# Patient Record
Sex: Female | Born: 1980 | Race: Black or African American | Hispanic: No | Marital: Married | Smoking: Never smoker
Health system: Southern US, Community
[De-identification: ages and names within clinical notes are randomized; demographics above are authoritative.]

## PROBLEM LIST (undated history)

## (undated) ENCOUNTER — Inpatient Hospital Stay (HOSPITAL_COMMUNITY): Payer: Self-pay

## (undated) DIAGNOSIS — D500 Iron deficiency anemia secondary to blood loss (chronic): Secondary | ICD-10-CM

## (undated) DIAGNOSIS — R51 Headache: Secondary | ICD-10-CM

## (undated) DIAGNOSIS — F32A Depression, unspecified: Secondary | ICD-10-CM

## (undated) DIAGNOSIS — R0600 Dyspnea, unspecified: Secondary | ICD-10-CM

## (undated) DIAGNOSIS — Z8639 Personal history of other endocrine, nutritional and metabolic disease: Secondary | ICD-10-CM

## (undated) DIAGNOSIS — N870 Mild cervical dysplasia: Secondary | ICD-10-CM

## (undated) DIAGNOSIS — R87619 Unspecified abnormal cytological findings in specimens from cervix uteri: Secondary | ICD-10-CM

## (undated) DIAGNOSIS — G43909 Migraine, unspecified, not intractable, without status migrainosus: Secondary | ICD-10-CM

## (undated) DIAGNOSIS — O09299 Supervision of pregnancy with other poor reproductive or obstetric history, unspecified trimester: Secondary | ICD-10-CM

## (undated) DIAGNOSIS — O139 Gestational [pregnancy-induced] hypertension without significant proteinuria, unspecified trimester: Secondary | ICD-10-CM

## (undated) DIAGNOSIS — R600 Localized edema: Secondary | ICD-10-CM

## (undated) DIAGNOSIS — D649 Anemia, unspecified: Secondary | ICD-10-CM

## (undated) DIAGNOSIS — O99345 Other mental disorders complicating the puerperium: Secondary | ICD-10-CM

## (undated) DIAGNOSIS — B9689 Other specified bacterial agents as the cause of diseases classified elsewhere: Secondary | ICD-10-CM

## (undated) DIAGNOSIS — R0602 Shortness of breath: Secondary | ICD-10-CM

## (undated) DIAGNOSIS — N921 Excessive and frequent menstruation with irregular cycle: Secondary | ICD-10-CM

## (undated) DIAGNOSIS — R300 Dysuria: Secondary | ICD-10-CM

## (undated) DIAGNOSIS — F53 Postpartum depression: Secondary | ICD-10-CM

## (undated) DIAGNOSIS — F329 Major depressive disorder, single episode, unspecified: Secondary | ICD-10-CM

## (undated) DIAGNOSIS — R000 Tachycardia, unspecified: Secondary | ICD-10-CM

## (undated) DIAGNOSIS — E785 Hyperlipidemia, unspecified: Secondary | ICD-10-CM

## (undated) DIAGNOSIS — N946 Dysmenorrhea, unspecified: Secondary | ICD-10-CM

## (undated) DIAGNOSIS — N8003 Adenomyosis of the uterus: Secondary | ICD-10-CM

## (undated) DIAGNOSIS — Z87440 Personal history of urinary (tract) infections: Secondary | ICD-10-CM

## (undated) DIAGNOSIS — K5730 Diverticulosis of large intestine without perforation or abscess without bleeding: Secondary | ICD-10-CM

## (undated) DIAGNOSIS — N760 Acute vaginitis: Secondary | ICD-10-CM

## (undated) DIAGNOSIS — I499 Cardiac arrhythmia, unspecified: Secondary | ICD-10-CM

## (undated) DIAGNOSIS — N762 Acute vulvitis: Secondary | ICD-10-CM

## (undated) DIAGNOSIS — R011 Cardiac murmur, unspecified: Secondary | ICD-10-CM

## (undated) DIAGNOSIS — M549 Dorsalgia, unspecified: Secondary | ICD-10-CM

## (undated) DIAGNOSIS — R5383 Other fatigue: Secondary | ICD-10-CM

## (undated) DIAGNOSIS — E611 Iron deficiency: Secondary | ICD-10-CM

## (undated) DIAGNOSIS — Z9889 Other specified postprocedural states: Secondary | ICD-10-CM

## (undated) DIAGNOSIS — IMO0002 Reserved for concepts with insufficient information to code with codable children: Secondary | ICD-10-CM

## (undated) DIAGNOSIS — I10 Essential (primary) hypertension: Secondary | ICD-10-CM

## (undated) DIAGNOSIS — Z8619 Personal history of other infectious and parasitic diseases: Secondary | ICD-10-CM

## (undated) DIAGNOSIS — M25569 Pain in unspecified knee: Secondary | ICD-10-CM

## (undated) DIAGNOSIS — F419 Anxiety disorder, unspecified: Secondary | ICD-10-CM

## (undated) DIAGNOSIS — A749 Chlamydial infection, unspecified: Secondary | ICD-10-CM

## (undated) DIAGNOSIS — B999 Unspecified infectious disease: Secondary | ICD-10-CM

## (undated) HISTORY — DX: Dorsalgia, unspecified: M54.9

## (undated) HISTORY — DX: Essential (primary) hypertension: I10

## (undated) HISTORY — DX: Personal history of urinary (tract) infections: Z87.440

## (undated) HISTORY — DX: Major depressive disorder, single episode, unspecified: F32.9

## (undated) HISTORY — DX: Anemia, unspecified: D64.9

## (undated) HISTORY — DX: Dyspnea, unspecified: R06.00

## (undated) HISTORY — DX: Acute vaginitis: N76.0

## (undated) HISTORY — DX: Iron deficiency: E61.1

## (undated) HISTORY — DX: Personal history of other infectious and parasitic diseases: Z86.19

## (undated) HISTORY — DX: Gestational (pregnancy-induced) hypertension without significant proteinuria, unspecified trimester: O13.9

## (undated) HISTORY — DX: Reserved for concepts with insufficient information to code with codable children: IMO0002

## (undated) HISTORY — DX: Cardiac arrhythmia, unspecified: I49.9

## (undated) HISTORY — PX: BREAST SURGERY: SHX581

## (undated) HISTORY — PX: FINGER SURGERY: SHX640

## (undated) HISTORY — DX: Chlamydial infection, unspecified: A74.9

## (undated) HISTORY — DX: Other specified postprocedural states: Z98.890

## (undated) HISTORY — DX: Dysuria: R30.0

## (undated) HISTORY — DX: Shortness of breath: R06.02

## (undated) HISTORY — DX: Unspecified infectious disease: B99.9

## (undated) HISTORY — DX: Headache: R51

## (undated) HISTORY — DX: Other specified bacterial agents as the cause of diseases classified elsewhere: B96.89

## (undated) HISTORY — DX: Depression, unspecified: F32.A

## (undated) HISTORY — DX: Unspecified abnormal cytological findings in specimens from cervix uteri: R87.619

## (undated) HISTORY — DX: Pain in unspecified knee: M25.569

## (undated) HISTORY — DX: Supervision of pregnancy with other poor reproductive or obstetric history, unspecified trimester: O09.299

## (undated) HISTORY — DX: Anxiety disorder, unspecified: F41.9

## (undated) HISTORY — PX: WISDOM TOOTH EXTRACTION: SHX21

## (undated) HISTORY — DX: Other mental disorders complicating the puerperium: O99.345

## (undated) HISTORY — DX: Localized edema: R60.0

## (undated) HISTORY — DX: Mild cervical dysplasia: N87.0

## (undated) HISTORY — DX: Dysmenorrhea, unspecified: N94.6

## (undated) HISTORY — DX: Tachycardia, unspecified: R00.0

## (undated) HISTORY — DX: Other fatigue: R53.83

## (undated) HISTORY — DX: Hyperlipidemia, unspecified: E78.5

## (undated) HISTORY — DX: Adenomyosis of the uterus: N80.03

## (undated) HISTORY — DX: Acute vulvitis: N76.2

## (undated) HISTORY — DX: Personal history of other endocrine, nutritional and metabolic disease: Z86.39

## (undated) HISTORY — PX: DILATION AND CURETTAGE OF UTERUS: SHX78

## (undated) HISTORY — DX: Postpartum depression: F53.0

---

## 1999-12-09 HISTORY — PX: FINGER SURGERY: SHX640

## 2006-03-13 ENCOUNTER — Emergency Department (HOSPITAL_COMMUNITY): Admission: EM | Admit: 2006-03-13 | Discharge: 2006-03-13 | Payer: Self-pay | Admitting: Emergency Medicine

## 2006-12-08 DIAGNOSIS — N870 Mild cervical dysplasia: Secondary | ICD-10-CM

## 2006-12-08 HISTORY — PX: BREAST REDUCTION SURGERY: SHX8

## 2006-12-08 HISTORY — DX: Mild cervical dysplasia: N87.0

## 2007-04-15 ENCOUNTER — Emergency Department (HOSPITAL_COMMUNITY): Admission: EM | Admit: 2007-04-15 | Discharge: 2007-04-15 | Payer: Self-pay | Admitting: Emergency Medicine

## 2007-12-09 DIAGNOSIS — N762 Acute vulvitis: Secondary | ICD-10-CM

## 2007-12-09 DIAGNOSIS — I499 Cardiac arrhythmia, unspecified: Secondary | ICD-10-CM

## 2007-12-09 DIAGNOSIS — Z87410 Personal history of cervical dysplasia: Secondary | ICD-10-CM

## 2007-12-09 HISTORY — DX: Cardiac arrhythmia, unspecified: I49.9

## 2007-12-09 HISTORY — DX: Acute vulvitis: N76.2

## 2007-12-09 HISTORY — DX: Personal history of cervical dysplasia: Z87.410

## 2008-04-04 DIAGNOSIS — IMO0002 Reserved for concepts with insufficient information to code with codable children: Secondary | ICD-10-CM

## 2008-04-04 HISTORY — DX: Reserved for concepts with insufficient information to code with codable children: IMO0002

## 2008-07-25 ENCOUNTER — Ambulatory Visit (HOSPITAL_COMMUNITY): Admission: RE | Admit: 2008-07-25 | Discharge: 2008-07-25 | Payer: Self-pay | Admitting: Obstetrics and Gynecology

## 2008-07-25 ENCOUNTER — Encounter (INDEPENDENT_AMBULATORY_CARE_PROVIDER_SITE_OTHER): Payer: Self-pay | Admitting: Obstetrics and Gynecology

## 2008-07-25 HISTORY — PX: LEEP: SHX91

## 2008-10-16 ENCOUNTER — Ambulatory Visit (HOSPITAL_COMMUNITY): Admission: RE | Admit: 2008-10-16 | Discharge: 2008-10-16 | Payer: Self-pay | Admitting: Obstetrics and Gynecology

## 2008-10-16 ENCOUNTER — Encounter (INDEPENDENT_AMBULATORY_CARE_PROVIDER_SITE_OTHER): Payer: Self-pay | Admitting: Obstetrics and Gynecology

## 2008-10-16 HISTORY — PX: CERVICAL BIOPSY  W/ LOOP ELECTRODE EXCISION: SUR135

## 2008-12-08 DIAGNOSIS — R000 Tachycardia, unspecified: Secondary | ICD-10-CM

## 2008-12-08 DIAGNOSIS — R300 Dysuria: Secondary | ICD-10-CM

## 2008-12-08 HISTORY — PX: CERVICAL BIOPSY  W/ LOOP ELECTRODE EXCISION: SUR135

## 2008-12-08 HISTORY — DX: Tachycardia, unspecified: R00.0

## 2008-12-08 HISTORY — DX: Dysuria: R30.0

## 2009-06-18 HISTORY — PX: US ECHOCARDIOGRAPHY: HXRAD669

## 2009-08-24 ENCOUNTER — Inpatient Hospital Stay (HOSPITAL_COMMUNITY): Admission: AD | Admit: 2009-08-24 | Discharge: 2009-08-24 | Payer: Self-pay | Admitting: Obstetrics and Gynecology

## 2009-11-21 ENCOUNTER — Inpatient Hospital Stay (HOSPITAL_COMMUNITY): Admission: AD | Admit: 2009-11-21 | Discharge: 2009-11-21 | Payer: Self-pay | Admitting: Obstetrics and Gynecology

## 2009-12-08 DIAGNOSIS — B9689 Other specified bacterial agents as the cause of diseases classified elsewhere: Secondary | ICD-10-CM

## 2009-12-08 DIAGNOSIS — F53 Postpartum depression: Secondary | ICD-10-CM

## 2009-12-08 DIAGNOSIS — Z8759 Personal history of other complications of pregnancy, childbirth and the puerperium: Secondary | ICD-10-CM

## 2009-12-08 HISTORY — DX: Personal history of other complications of pregnancy, childbirth and the puerperium: Z87.59

## 2009-12-08 HISTORY — DX: Other specified bacterial agents as the cause of diseases classified elsewhere: B96.89

## 2009-12-08 HISTORY — DX: Postpartum depression: F53.0

## 2009-12-21 ENCOUNTER — Inpatient Hospital Stay (HOSPITAL_COMMUNITY): Admission: AD | Admit: 2009-12-21 | Discharge: 2009-12-23 | Payer: Self-pay | Admitting: Obstetrics and Gynecology

## 2011-01-20 ENCOUNTER — Ambulatory Visit (HOSPITAL_BASED_OUTPATIENT_CLINIC_OR_DEPARTMENT_OTHER): Admission: RE | Admit: 2011-01-20 | Payer: 59 | Source: Ambulatory Visit | Admitting: Obstetrics & Gynecology

## 2011-01-27 ENCOUNTER — Other Ambulatory Visit (HOSPITAL_COMMUNITY): Payer: 59

## 2011-01-28 ENCOUNTER — Ambulatory Visit (HOSPITAL_COMMUNITY)
Admission: RE | Admit: 2011-01-28 | Discharge: 2011-01-28 | Disposition: A | Discharge status: Home / Self Care | Payer: 59 | Source: Ambulatory Visit | Attending: Obstetrics & Gynecology | Admitting: Obstetrics & Gynecology

## 2011-01-28 ENCOUNTER — Other Ambulatory Visit: Payer: Self-pay | Admitting: Obstetrics & Gynecology

## 2011-01-28 DIAGNOSIS — O039 Complete or unspecified spontaneous abortion without complication: Secondary | ICD-10-CM | POA: Insufficient documentation

## 2011-01-28 LAB — TYPE AND SCREEN
ABO/RH(D): A POS
Antibody Screen: NEGATIVE

## 2011-01-28 LAB — CBC
MCHC: 30.4 g/dL (ref 30.0–36.0)
WBC: 4 10*3/uL (ref 4.0–10.5)

## 2011-01-29 HISTORY — PX: DILATION AND EVACUATION: SHX1459

## 2011-02-19 NOTE — Op Note (Signed)
  NAME:  Patricia Lin, Patricia Lin                 ACCOUNT NO.:  [id number]  MEDICAL RECORD NO.:  [id number]           PATIENT TYPE:  O  LOCATION:  [facility]                          [facility]AC[facility]  [facility]  PHYSICIAN:  Genia Del, M.D.DATE OF BIRTH:  06-13-1981  DATE OF PROCEDURE:  01/28/2011 DATE OF DISCHARGE:  01/28/2011                              OPERATIVE REPORT   PREOPERATIVE DIAGNOSIS:  A 9 plus weeks' gestation, unwanted pregnancy.  POSTOPERATIVE DIAGNOSIS:  A 9 plus weeks' gestation, unwanted pregnancy.  PROCEDURE:  Dilatation and evacuation with sharp curettage and suction.  SURGEON:  Genia Del, MD  ASSISTANT:  None.  DESCRIPTION OF PROCEDURE:  Under MAC analgesia, the patient was in lithotomy position, she was prepped with Betadine on the suprapubic vulvar and vaginal areas and draped as usual.  The vaginal exam reveals an anteverted uterus about 10 cm, mobile, no adnexal mass.  The cervix was Heyne and closed.  No vaginal bleeding.  We put the speculum in the vagina and the anterior lip of the cervix was grasped with a tenaculum. We proceeded with a paracervical block with lidocaine 1%, a total of 20 mL at 4 and 8 o'clock.  We then dilated the cervix with Hegar dilators up to #29 without difficulty.  We used a #9 curved curette to suction the intrauterine cavity.  This was done without difficulty.  We then used a sharp curette to assure that products of conception have been removed from all intrauterine surfaces.  We heard the uterine sound.  We then gone back with the suction to assure that all products of conception and blood clots were removed.  The uterus contracts well on the instrument.  Products of conception corresponding to 9 plus weeks were removed and sent to Pathology.  Hemostasis was adequate.  We removed all instruments.  Hemostasis was good.  The estimated blood loss was 50 mL.  The patient received Ancef 1 g IV before the procedure.  The bladder was catheterized  also before the procedure.  No complications occurred and the patient was brought to recovery room in good stable status.     Genia Del, M.D.     ML/MEDQ  D:  01/28/2011  T:  01/29/2011  Job:  [id number]  Electronically Signed by Genia Del M.D. on 02/19/2011 06:23:09 PM

## 2011-02-23 LAB — COMPREHENSIVE METABOLIC PANEL
ALT: 13 U/L (ref 0–35)
ALT: 15 U/L (ref 0–35)
AST: 19 U/L (ref 0–37)
AST: 28 U/L (ref 0–37)
Alkaline Phosphatase: 111 U/L (ref 39–117)
BUN: 4 mg/dL — ABNORMAL LOW (ref 6–23)
BUN: 9 mg/dL (ref 6–23)
CO2: 24 mEq/L (ref 19–32)
Calcium: 7.6 mg/dL — ABNORMAL LOW (ref 8.4–10.5)
Creatinine, Ser: 0.51 mg/dL (ref 0.4–1.2)
Creatinine, Ser: 0.69 mg/dL (ref 0.4–1.2)
GFR calc Af Amer: >60 mL/min (ref >60)
GFR calc Af Amer: >60 mL/min (ref >60)
GFR calc non Af Amer: >60 mL/min (ref >60)
Glucose, Bld: 103 mg/dL — ABNORMAL HIGH (ref 70–99)
Glucose, Bld: 77 mg/dL (ref 70–99)
Potassium: 3.9 mEq/L (ref 3.5–5.1)
Sodium: 134 mEq/L — ABNORMAL LOW (ref 135–145)
Total Bilirubin: 0.6 mg/dL (ref 0.3–1.2)
Total Protein: 5 g/dL — ABNORMAL LOW (ref 6.0–8.3)

## 2011-02-23 LAB — CBC
Hemoglobin: 7.7 g/dL — ABNORMAL LOW (ref 12.0–15.0)
MCHC: 31.9 g/dL (ref 30.0–36.0)
MCHC: 32.8 g/dL (ref 30.0–36.0)
MCV: 72.5 fL — ABNORMAL LOW (ref 78.0–100.0)
RBC: 3.25 MIL/uL — ABNORMAL LOW (ref 3.87–5.11)
RBC: 3.77 MIL/uL — ABNORMAL LOW (ref 3.87–5.11)
RDW: 16.8 % — ABNORMAL HIGH (ref 11.5–15.5)
WBC: 4.8 10*3/uL (ref 4.0–10.5)

## 2011-02-23 LAB — URINALYSIS, ROUTINE W REFLEX MICROSCOPIC
Bilirubin Urine: NEGATIVE
Glucose, UA: NEGATIVE mg/dL
Glucose, UA: NEGATIVE mg/dL
Hgb urine dipstick: NEGATIVE
Ketones, ur: NEGATIVE mg/dL
Leukocytes, UA: NEGATIVE
Protein, ur: 30 mg/dL — AB
Specific Gravity, Urine: >1.03 — ABNORMAL HIGH (ref 1.005–1.030)
Urobilinogen, UA: 0.2 mg/dL (ref 0.0–1.0)
Urobilinogen, UA: 0.2 mg/dL (ref 0.0–1.0)

## 2011-02-23 LAB — URINE MICROSCOPIC-ADD ON

## 2011-02-23 LAB — TYPE AND SCREEN
ABO/RH(D): A POS
Antibody Screen: NEGATIVE

## 2011-03-11 LAB — GC/CHLAMYDIA PROBE AMP, GENITAL
Chlamydia, DNA Probe: NEGATIVE
GC Probe Amp, Genital: NEGATIVE

## 2011-03-11 LAB — WET PREP, GENITAL
Clue Cells Wet Prep HPF POC: NONE SEEN
Trich, Wet Prep: NONE SEEN

## 2011-03-11 LAB — STREP B DNA PROBE

## 2011-03-14 LAB — URINALYSIS, ROUTINE W REFLEX MICROSCOPIC
Glucose, UA: NEGATIVE mg/dL
Ketones, ur: >80 mg/dL — AB
Nitrite: NEGATIVE
Specific Gravity, Urine: >1.03 — ABNORMAL HIGH (ref 1.005–1.030)
Urobilinogen, UA: 1 mg/dL (ref 0.0–1.0)

## 2011-03-14 LAB — URINE CULTURE

## 2011-04-22 NOTE — Op Note (Signed)
NAME:  Patricia Lin, Patricia Lin                 ACCOUNT NO.:  [id number]   MEDICAL RECORD NO.:  [id number]          PATIENT TYPE:  AMB   LOCATION:  [facility]                           [facility]ILITY:  [facility]   PHYSICIAN:  Osborn Coho, M.D.   DATE OF BIRTH:  1981-07-01   DATE OF PROCEDURE:  DATE OF DISCHARGE:                               OPERATIVE REPORT   PREOPERATIVE DIAGNOSIS:  Status post loop electrosurgical excision  procedure secondary to persistent cervical intraepithelial neoplasia-1  with positive endocervical margins.   POSTOPERATIVE DIAGNOSIS:  Status post loop electrosurgical excision  procedure secondary to persistent cervical intraepithelial neoplasia-1  with positive endocervical margins.   PROCEDURE:  LEEP, top-hat.   ATTENDING DOCTOR:  Osborn Coho, MD   ANESTHESIA:  General via LMA.   SPECIMENS:  To pathology top-hat with a silk stitch at 9 o'clock and  portion of cervix.   FLUIDS:  1000 mL.   URINE OUTPUT:  Quantity sufficient via straight cath prior to procedure.   ESTIMATED BLOOD LOSS:  Minimal.   COMPLICATIONS:  None.   PROCEDURE:  The patient was taken to the operating room after the risks,  benefits, and alternatives discussed with the patient.  The patient  verbalized understanding, consent signed and witnessed.  The patient was  placed under general anesthesia and prepped and draped in the normal  sterile fashion in the dorsal lithotomy fashion.  A bivalved speculum  placed in the patient's vagina with the smoke evacuator, and the  anterior lip of the cervix grasped with a single-tooth tenaculum.  Cervix was prepped with acetic acid, and there was a lesion noted at the  9 and 12 o'clock position.  Top-hat was performed and then LEEP  performed as well with the 15-mm x 12-mm loop.  Ball cautery was used to  cauterize the base of the cervix and Monsel solution applied.  There was  good hemostasis.  The tenaculum was removed.  There was bleeding noted  at the tenaculum  sites which was cauterized with the ball cautery tip.  Count was correct.  The patient tolerated the procedure well and is  currently awaiting transfer to the recovery room in good condition.     Osborn Coho, M.D.  Electronically Signed    AR/MEDQ  D:  10/16/2008  T:  10/17/2008  Job:  [id number]

## 2011-04-22 NOTE — Op Note (Signed)
[name], Patricia Lin                 ACCOUNT NO.:  [id number]   MEDICAL RECORD NO.:  [id number]          PATIENT TYPE:  AMB   LOCATION:  [facility]                           [facility]   PHYSICIAN:  Osborn Coho, M.D.   DATE OF BIRTH:  [birth year]   DATE OF PROCEDURE:  07/25/2008  DATE OF DISCHARGE:                               OPERATIVE REPORT   PREOPERATIVE DIAGNOSIS:  Persistent cervical intraepithelial neoplasia  I.   POSTOPERATIVE DIAGNOSIS:  Persistent cervical intraepithelial neoplasia  I.   PROCEDURE:  LEEP.   ATTENDING:  Osborn Coho, MD   ANESTHESIA:  MAC.   FINDINGS:  Acetowhite lesions at 6 to 9 o'clock and 10 o'clock.   SPECIMENS TO PATHOLOGY:  Two cervical LEEP specimens, one with a black  suture at 3 o'clock and one with a white suture at 12 o'clock.   FLUIDS:  700 mL.   URINE OUTPUT:  Quantity sufficient via straight cath prior to procedure.   EBL:  Minimal.   COMPLICATIONS:  None.   PROCEDURE:  The patient was taken to the operating room after the risks,  benefits, and alternatives reviewed with the patient.  The patient  verbalized understanding and consent signed and witnessed.  The patient  was given a MAC per anesthesia, prepped and draped in normal sterile  fashion in the dorsal lithotomy position.  A bivalve speculum was placed  in the patient's vagina and acetic acid was placed on the cervix.  The  acetowhite lesions were as noted above.  LEEP was performed with the  medium sized loop measuring 15 mm x 12 mm.  The specimens were tagged as  noted above.  The bed of the cervix was cauterized with the ball tip  cautery.  A Monsel's solution was placed in the bed as well.  The  anterior lip of cervix had been grasped with a single-tooth tenaculum,  and there was good hemostasis at tenaculum site after the tenaculum was  removed.  A paracervical block was administered using a total  of 10 mL of 1% lidocaine.  Good hemostasis was noted.  All  instruments  were removed.  Sponge, lap, and needle count was correct.  The patient  tolerated the procedure well and is currently awaiting transfer to the  recovery room in good condition.      Osborn Coho, M.D.  Electronically Signed     AR/MEDQ  D:  07/25/2008  T:  07/26/2008  Job:  [id number]

## 2011-09-09 LAB — CBC
HCT: 30.1 — ABNORMAL LOW
Hemoglobin: 9.7 — ABNORMAL LOW
Platelets: 315
RDW: 17.5 — ABNORMAL HIGH
WBC: 3.5 — ABNORMAL LOW

## 2011-09-09 LAB — HCG, SERUM, QUALITATIVE: Preg, Serum: NEGATIVE

## 2012-04-15 ENCOUNTER — Encounter: Payer: Self-pay | Admitting: *Deleted

## 2012-06-28 ENCOUNTER — Ambulatory Visit (INDEPENDENT_AMBULATORY_CARE_PROVIDER_SITE_OTHER): Payer: 59 | Admitting: Obstetrics and Gynecology

## 2012-06-28 ENCOUNTER — Encounter: Payer: Self-pay | Admitting: Obstetrics and Gynecology

## 2012-06-28 DIAGNOSIS — Z36 Encounter for antenatal screening of mother: Secondary | ICD-10-CM

## 2012-06-28 DIAGNOSIS — Z3201 Encounter for pregnancy test, result positive: Secondary | ICD-10-CM

## 2012-06-28 DIAGNOSIS — Z331 Pregnant state, incidental: Secondary | ICD-10-CM

## 2012-06-28 LAB — POCT URINALYSIS DIPSTICK
Bilirubin, UA: NEGATIVE
Blood, UA: NEGATIVE
Glucose, UA: NEGATIVE
Ketones, UA: NEGATIVE
Nitrite, UA: NEGATIVE
Spec Grav, UA: 1.025

## 2012-06-28 MED ORDER — ONDANSETRON 4 MG PO TBDP: 4.0000 mg | ORAL_TABLET | Freq: Three times a day (TID) | ORAL | Status: AC | PRN

## 2012-06-28 NOTE — Progress Notes (Signed)
PT TAKING IBUPROFEN AND OCC EXEDRIN MIGRAINE FOR HEADACHE.  ALSO OCC ASA WHEN HAS HEART TACHYCARDIA. PER DD ADVISED NOT TO TAKE ANY OF THE LISTED MEDICATIONS.  ADVISED 8-10 GLASSES WATER PER DAY AS IS CURRENTLY NOT DRINKING ANY.  ALSO SUGGESTED TYLENOL WITH CAFFEINE FOR HEADACHE.   TO CALL WITH NO IMPROVEMENT. ZOFRAN ORDERED FOR NAUSEA AND VOMITING. CONSTIPATION PRECAUTIONS GIVEN.  OFFERED PT NOB W/U  APPOINTMENT 06/30/12. PT DECLINED DUE TO PAYMENT ISSUES. SCHEDULED 07/09/12.  1ST TRIMESTER APPT 07/02/12.

## 2012-06-29 LAB — PRENATAL PANEL VII
HCT: 27.9 % — ABNORMAL LOW (ref 36.0–46.0)
HIV: NONREACTIVE
Hemoglobin: 8.8 g/dL — ABNORMAL LOW (ref 12.0–15.0)
Hepatitis B Surface Ag: NEGATIVE
Lymphs Abs: 1.4 10*3/uL (ref 0.7–4.0)
MCH: 21 pg — ABNORMAL LOW (ref 26.0–34.0)
Monocytes Relative: 8 % (ref 3–12)
Neutro Abs: 3.8 10*3/uL (ref 1.7–7.7)
Neutrophils Relative %: 66 % (ref 43–77)
RBC: 4.19 MIL/uL (ref 3.87–5.11)
Rubella: 17.9 IU/mL — ABNORMAL HIGH

## 2012-07-02 ENCOUNTER — Telehealth: Payer: Self-pay | Admitting: Obstetrics and Gynecology

## 2012-07-02 ENCOUNTER — Ambulatory Visit (INDEPENDENT_AMBULATORY_CARE_PROVIDER_SITE_OTHER): Payer: 59

## 2012-07-02 ENCOUNTER — Other Ambulatory Visit: Payer: Self-pay | Admitting: Obstetrics and Gynecology

## 2012-07-02 DIAGNOSIS — Z36 Encounter for antenatal screening of mother: Secondary | ICD-10-CM

## 2012-07-02 LAB — US OB COMP LESS 14 WKS

## 2012-07-02 NOTE — Telephone Encounter (Signed)
Message copied by Mason Jim on Fri Jul 02, 2012  4:05 PM ------      Message from: [facility], [place]      Created: Fri Jul 02, 2012 11:13 AM      Regarding: needs RX iron        Needs RX feso4 1 po daily #30 refill x 5      Nutrition for anemia

## 2012-07-02 NOTE — Telephone Encounter (Signed)
TC to pt. LM to return call.  

## 2012-07-09 ENCOUNTER — Encounter: Payer: 59 | Admitting: Obstetrics and Gynecology

## 2012-08-11 ENCOUNTER — Other Ambulatory Visit: Payer: Self-pay

## 2012-08-11 ENCOUNTER — Encounter: Payer: Self-pay | Admitting: Obstetrics and Gynecology

## 2012-08-11 ENCOUNTER — Ambulatory Visit (INDEPENDENT_AMBULATORY_CARE_PROVIDER_SITE_OTHER): Payer: 59 | Admitting: Obstetrics and Gynecology

## 2012-08-11 ENCOUNTER — Telehealth: Payer: Self-pay | Admitting: Obstetrics and Gynecology

## 2012-08-11 VITALS — BP 100/60 | Temp 98.6°F | Ht 62.0 in | Wt 178.0 lb

## 2012-08-11 DIAGNOSIS — N760 Acute vaginitis: Secondary | ICD-10-CM

## 2012-08-11 DIAGNOSIS — A499 Bacterial infection, unspecified: Secondary | ICD-10-CM

## 2012-08-11 DIAGNOSIS — R300 Dysuria: Secondary | ICD-10-CM

## 2012-08-11 DIAGNOSIS — B9689 Other specified bacterial agents as the cause of diseases classified elsewhere: Secondary | ICD-10-CM

## 2012-08-11 DIAGNOSIS — Z3689 Encounter for other specified antenatal screening: Secondary | ICD-10-CM

## 2012-08-11 DIAGNOSIS — Z331 Pregnant state, incidental: Secondary | ICD-10-CM

## 2012-08-11 DIAGNOSIS — N898 Other specified noninflammatory disorders of vagina: Secondary | ICD-10-CM

## 2012-08-11 LAB — POCT URINALYSIS DIPSTICK
Blood, UA: NEGATIVE
Protein, UA: NEGATIVE
Spec Grav, UA: 1.01
Urobilinogen, UA: NEGATIVE

## 2012-08-11 MED ORDER — IBUPROFEN 600 MG PO TABS: 600.0000 mg | ORAL_TABLET | Freq: Four times a day (QID) | ORAL | Status: AC | PRN

## 2012-08-11 MED ORDER — METRONIDAZOLE 250 MG PO TABS: 250.0000 mg | ORAL_TABLET | Freq: Three times a day (TID) | ORAL | Status: AC

## 2012-08-11 NOTE — Progress Notes (Signed)
[redacted]w[redacted]d abd pain/dysuria Contraception: none currently pregnant History of STD:  history of chlamydia History of ovarian cyst: no History of fibroids: no History of endometriosis:no Previous ultrasound:no  Urinary symptoms: dysuria Gastro-intestinal symptoms:  Constipation: no     Diarrhea: no     Nausea: no     Vomiting: no     Fever: no Vaginal discharge: color white, with odor

## 2012-08-11 NOTE — Progress Notes (Signed)
[redacted]w[redacted]d Worked in for low abdominal pain. Chem 10 with trace of ketones, O/W negative Pubic bone pain worse when stands or walks, improved with sitting down. Some burning with urination OSOM BV: positive   OSOM Trichomonas: negative Plan: Flagyl 250 mg TID for 7 days          Increase H2O, may add Cranberry, urine to culture          Ibuprofen 600 mg PRN and local heat for pubic bone pain

## 2012-08-11 NOTE — Telephone Encounter (Signed)
TC from pt.  Stats having lower abd pain expescially with walking.   Relieved with rest.  Is drinking sufficient water.  Has dysuria x 2 days.   To office for eval per Dr SR.

## 2012-08-13 LAB — URINE CULTURE
Colony Count: NO GROWTH
Organism ID, Bacteria: NO GROWTH

## 2012-08-19 ENCOUNTER — Encounter: Payer: Self-pay | Admitting: Obstetrics and Gynecology

## 2012-08-19 ENCOUNTER — Ambulatory Visit (INDEPENDENT_AMBULATORY_CARE_PROVIDER_SITE_OTHER): Payer: 59

## 2012-08-19 ENCOUNTER — Ambulatory Visit (INDEPENDENT_AMBULATORY_CARE_PROVIDER_SITE_OTHER): Payer: 59 | Admitting: Obstetrics and Gynecology

## 2012-08-19 VITALS — BP 110/62 | Wt 177.0 lb

## 2012-08-19 DIAGNOSIS — Z331 Pregnant state, incidental: Secondary | ICD-10-CM

## 2012-08-19 DIAGNOSIS — Z3689 Encounter for other specified antenatal screening: Secondary | ICD-10-CM

## 2012-08-19 DIAGNOSIS — G44209 Tension-type headache, unspecified, not intractable: Secondary | ICD-10-CM

## 2012-08-19 DIAGNOSIS — O0930 Supervision of pregnancy with insufficient antenatal care, unspecified trimester: Secondary | ICD-10-CM

## 2012-08-19 DIAGNOSIS — O09299 Supervision of pregnancy with other poor reproductive or obstetric history, unspecified trimester: Secondary | ICD-10-CM

## 2012-08-19 DIAGNOSIS — Z9889 Other specified postprocedural states: Secondary | ICD-10-CM

## 2012-08-19 DIAGNOSIS — Z8659 Personal history of other mental and behavioral disorders: Secondary | ICD-10-CM

## 2012-08-19 DIAGNOSIS — O09219 Supervision of pregnancy with history of pre-term labor, unspecified trimester: Secondary | ICD-10-CM

## 2012-08-19 DIAGNOSIS — O3440 Maternal care for other abnormalities of cervix, unspecified trimester: Secondary | ICD-10-CM

## 2012-08-19 DIAGNOSIS — O10019 Pre-existing essential hypertension complicating pregnancy, unspecified trimester: Secondary | ICD-10-CM

## 2012-08-19 DIAGNOSIS — O10919 Unspecified pre-existing hypertension complicating pregnancy, unspecified trimester: Secondary | ICD-10-CM

## 2012-08-19 DIAGNOSIS — F4541 Pain disorder exclusively related to psychological factors: Secondary | ICD-10-CM | POA: Insufficient documentation

## 2012-08-19 LAB — US OB COMP + 14 WK

## 2012-08-19 MED ORDER — CYCLOBENZAPRINE HCL 10 MG PO TABS: 10.0000 mg | ORAL_TABLET | Freq: Three times a day (TID) | ORAL | Status: AC | PRN

## 2012-08-19 NOTE — Addendum Note (Signed)
Addended by: Cornelius Moras on: 08/19/2012 08:03 PM   Modules accepted: Orders

## 2012-08-19 NOTE — Progress Notes (Signed)
[redacted]w[redacted]d Pt c/o headaches.

## 2012-08-19 NOTE — Progress Notes (Signed)
Subjective:    Patricia Lin is being seen today for her first obstetrical visit at [redacted]w[redacted]d gestation by LMP and confirmed by [place] today. No previous care.  Is in a hurry to leave after [place], unable to stay for cultures and Quad screen.  Will do in 2 weeks at [facility].  Per [facility] previous notes, patient may be chronic hypertension, with strong FHx, but no current issues.    She reports doing well--hopes she doesn't have high BP this time.  Also had a precipitous delivery last pregnancy. Has HAs, present prior to pregnancy.  Has them now, in occiput, no migranous tendencies.  Her obstetrical history is significant for: Patient Active Problem List  Diagnosis  . H/O pre-eclampsia in prior pregnancy, currently pregnant  . Chronic hypertension in pregnancy  . Hx LEEP (loop electrosurgical excision procedure), cervix, pregnancy  . Hx of postpartum depression, currently pregnant  . Late prenatal care  . Hx of precipitous labor and deliveries, antepartum  . Muscle tension HAs    Relationship with FOB:  Supportive  Patient is not sure of plan for feeding.    Pregnancy history fully reviewed.  The following portions of the patient's history were reviewed and updated as appropriate: allergies, current medications, past family history, past medical history, past social history, past surgical history and problem list.  Review of Systems Pertinent ROS is described in HPI   Objective:   BP 110/62  Wt 177 lb (80.287 kg)  LMP 04/01/2012 Wt Readings from Last 1 Encounters:  08/19/12 177 lb (80.287 kg)   BMI: There is no height on file to calculate BMI.  General: alert, cooperative and no distress Respiratory: clear to auscultation bilaterally Cardiovascular: regular rate and rhythm, S1, S2 normal, no murmur Breasts:  No dominant masses, nipples erect Gastrointestinal: soft, non-tender; no masses,  no organomegaly Extremities: extremities normal, no pain or edema Vaginal Bleeding: None  EXTERNAL  GENITALIA: normal appearing vulva with no masses, tenderness or lesions VAGINA: no abnormal discharge or lesions CERVIX: no lesions or cervical motion tenderness; cervix closed, Hallquist, firm UTERUS: gravid and consistent with 20 weeks ADNEXA: no masses palpable and nontender OB EXAM PELVIMETRY: appears adequate  [place] today: SIUP, 378 gms, 85.7%ile. Cervix 3.67.  Anterior placenta, normal anatomy and fluid. Initially DKWTK gender, but decided to reveal today--female.   FHR:  150  bpm  Assessment:    Pregnancy at  20 weeks Late to care Hx pre-eclampsia, ? Tendency for chronic HTN Hx LEEP/cryo Hx pp depression HAs  Plan:     Prenatal panel reviewed and discussed with the patient:yes Pap smear collected:  Will do at [facility] GC/Chlamydia collected:  Will do at [facility] Wet prep:  No Discussion of Genetic testing options: Wants Quad screen at [facility] Prenatal vitamins recommended Problem list reviewed and updated.  Plan of care: Follow up in 2 weeks for pap, cultures, Quad screen. Watch BP.  Nigel Bridgeman CNM, [place] 08/19/2012 7:28 PM

## 2012-08-19 NOTE — Progress Notes (Signed)
Rx Flexeril for muscle contraction HAs

## 2012-08-24 ENCOUNTER — Encounter: Payer: Self-pay | Admitting: Obstetrics and Gynecology

## 2012-08-25 ENCOUNTER — Telehealth: Payer: Self-pay | Admitting: Obstetrics and Gynecology

## 2012-08-25 MED ORDER — FLUCONAZOLE 150 MG PO TABS: 150.0000 mg | ORAL_TABLET | Freq: Once | ORAL | Status: DC

## 2012-08-25 NOTE — Telephone Encounter (Signed)
TC TO PT REGARDING VAGINAL ITCHING. PER VL WILL SEND IN RX FOR DIFLUCAN TO PT PHARMACY AND ALSO PT WAS CONCERNED ABOUT HURTING WHEN SHE WALKED. INFORMED PT PER VL TO SHE CAN TAKE IBUPROFEN EVERY 6 HOURS TO SEE IF SHE HAVE ANY RELIEF.INFORMED PT THAT IF PAIN GET WORSE TO GIVE [place] A CALL BACK. LEFT THIS MESSAGE ON PT VOICEMAIL AS REQUESTED BY THE PT.

## 2012-08-26 ENCOUNTER — Telehealth: Payer: Self-pay | Admitting: Obstetrics and Gynecology

## 2012-08-26 NOTE — Telephone Encounter (Signed)
Triage/quest about infect.

## 2012-08-26 NOTE — Telephone Encounter (Signed)
TC to pt. States has vulvar "bumps" that she noticed 08/25/12.  No tenderness or redness. Took Diflucan 08/25/12 and irritation is decreased. Declines  appt 08/26/12. Questioning if partner needs TX. Informed do not treat partners for yeast but cannot tell if has other infection without eval. Again declines appt. [place] 08/31/12.  To call if decides wants to be seen sooner.

## 2012-08-31 ENCOUNTER — Other Ambulatory Visit: Payer: 59

## 2012-08-31 ENCOUNTER — Ambulatory Visit (INDEPENDENT_AMBULATORY_CARE_PROVIDER_SITE_OTHER): Payer: 59 | Admitting: Obstetrics and Gynecology

## 2012-08-31 VITALS — BP 100/62 | Wt 179.0 lb

## 2012-08-31 DIAGNOSIS — Z331 Pregnant state, incidental: Secondary | ICD-10-CM

## 2012-08-31 DIAGNOSIS — Z3490 Encounter for supervision of normal pregnancy, unspecified, unspecified trimester: Secondary | ICD-10-CM

## 2012-08-31 DIAGNOSIS — Z124 Encounter for screening for malignant neoplasm of cervix: Secondary | ICD-10-CM

## 2012-08-31 MED ORDER — CLOTRIMAZOLE-BETAMETHASONE 1-0.05 % EX CREA: TOPICAL_CREAM | Freq: Every day | CUTANEOUS | Status: DC

## 2012-08-31 MED ORDER — BUTALBITAL-APAP-CAFFEINE 50-325-40 MG PO TABS: 1.0000 | ORAL_TABLET | Freq: Four times a day (QID) | ORAL | Status: DC | PRN

## 2012-08-31 NOTE — Progress Notes (Signed)
C/o pelvic pressure Pap,gc,ct done today Cervix on exam is soft and closed - 4.2cm on u/s nd 3.5 with valsalva Quad screen today Pt wants handicap sticker for work.  Papers given to clerical. C/o HAs, + h/o HAs worsened with pregnancy - trial of fioricet Vulvitis - lotrisone cream No abnl d/c, ph 4.0

## 2012-09-01 ENCOUNTER — Telehealth: Payer: Self-pay

## 2012-09-01 LAB — PAP IG, CT-NG, RFX HPV ASCU
Chlamydia Probe Amp: NEGATIVE
GC Probe Amp: NEGATIVE

## 2012-09-02 LAB — AFP, QUAD SCREEN
Age Alone: 1:532 {titer}
Curr Gest Age: 21.5 wks.days
INH: 140.3 pg/mL
MoM for AFP: 0.96
Osb Risk: 1:30900 {titer}
Trisomy 18 (Edward) Syndrome Interp.: 1:6720 {titer}
uE3 Value: 1.2 ng/mL

## 2012-09-03 ENCOUNTER — Emergency Department (HOSPITAL_COMMUNITY)
Admission: EM | Admit: 2012-09-03 | Discharge: 2012-09-03 | Disposition: A | Discharge status: Home / Self Care | Payer: 59 | Attending: Emergency Medicine | Admitting: Emergency Medicine

## 2012-09-03 ENCOUNTER — Ambulatory Visit (INDEPENDENT_AMBULATORY_CARE_PROVIDER_SITE_OTHER): Payer: 59 | Admitting: Obstetrics and Gynecology

## 2012-09-03 ENCOUNTER — Telehealth: Payer: Self-pay | Admitting: Obstetrics and Gynecology

## 2012-09-03 ENCOUNTER — Inpatient Hospital Stay (HOSPITAL_COMMUNITY)
Admission: AD | Admit: 2012-09-03 | Discharge: 2012-09-03 | Disposition: A | Discharge status: Home / Self Care | Payer: 59 | Source: Ambulatory Visit | Attending: Obstetrics and Gynecology | Admitting: Obstetrics and Gynecology

## 2012-09-03 ENCOUNTER — Encounter (HOSPITAL_COMMUNITY): Payer: Self-pay

## 2012-09-03 ENCOUNTER — Other Ambulatory Visit: Payer: Self-pay | Admitting: Obstetrics and Gynecology

## 2012-09-03 ENCOUNTER — Encounter (HOSPITAL_COMMUNITY): Payer: Self-pay | Admitting: Emergency Medicine

## 2012-09-03 VITALS — BP 102/62 | Wt 178.0 lb

## 2012-09-03 DIAGNOSIS — O99891 Other specified diseases and conditions complicating pregnancy: Secondary | ICD-10-CM | POA: Insufficient documentation

## 2012-09-03 DIAGNOSIS — O09219 Supervision of pregnancy with history of pre-term labor, unspecified trimester: Secondary | ICD-10-CM

## 2012-09-03 DIAGNOSIS — Z331 Pregnant state, incidental: Secondary | ICD-10-CM

## 2012-09-03 DIAGNOSIS — D649 Anemia, unspecified: Secondary | ICD-10-CM | POA: Diagnosis present

## 2012-09-03 DIAGNOSIS — O2690 Pregnancy related conditions, unspecified, unspecified trimester: Secondary | ICD-10-CM | POA: Insufficient documentation

## 2012-09-03 DIAGNOSIS — O09299 Supervision of pregnancy with other poor reproductive or obstetric history, unspecified trimester: Secondary | ICD-10-CM

## 2012-09-03 DIAGNOSIS — R109 Unspecified abdominal pain: Secondary | ICD-10-CM | POA: Insufficient documentation

## 2012-09-03 DIAGNOSIS — O10919 Unspecified pre-existing hypertension complicating pregnancy, unspecified trimester: Secondary | ICD-10-CM

## 2012-09-03 DIAGNOSIS — R0602 Shortness of breath: Secondary | ICD-10-CM | POA: Insufficient documentation

## 2012-09-03 DIAGNOSIS — Z9889 Other specified postprocedural states: Secondary | ICD-10-CM

## 2012-09-03 DIAGNOSIS — R079 Chest pain, unspecified: Secondary | ICD-10-CM | POA: Insufficient documentation

## 2012-09-03 DIAGNOSIS — O0930 Supervision of pregnancy with insufficient antenatal care, unspecified trimester: Secondary | ICD-10-CM

## 2012-09-03 DIAGNOSIS — F4541 Pain disorder exclusively related to psychological factors: Secondary | ICD-10-CM

## 2012-09-03 DIAGNOSIS — Z8659 Personal history of other mental and behavioral disorders: Secondary | ICD-10-CM

## 2012-09-03 DIAGNOSIS — Z3490 Encounter for supervision of normal pregnancy, unspecified, unspecified trimester: Secondary | ICD-10-CM

## 2012-09-03 DIAGNOSIS — O3440 Maternal care for other abnormalities of cervix, unspecified trimester: Secondary | ICD-10-CM

## 2012-09-03 LAB — POCT I-STAT TROPONIN I: Troponin i, poc: 0.02 ng/mL (ref 0.00–0.08)

## 2012-09-03 LAB — POCT URINALYSIS DIPSTICK
Bilirubin, UA: NEGATIVE
Blood, UA: NEGATIVE
Glucose, UA: NEGATIVE
Ketones, UA: NEGATIVE
Spec Grav, UA: <=1.005

## 2012-09-03 LAB — COMPREHENSIVE METABOLIC PANEL
ALT: 8 U/L (ref 0–35)
Albumin: 3 g/dL — ABNORMAL LOW (ref 3.5–5.2)
Alkaline Phosphatase: 74 U/L (ref 39–117)
BUN: 8 mg/dL (ref 6–23)
Chloride: 102 mEq/L (ref 96–112)
GFR calc Af Amer: >90 mL/min (ref >90)
Glucose, Bld: 83 mg/dL (ref 70–99)
Potassium: 3.8 mEq/L (ref 3.5–5.1)
Total Bilirubin: 0.3 mg/dL (ref 0.3–1.2)

## 2012-09-03 LAB — CBC
MCHC: 31.2 g/dL (ref 30.0–36.0)
Platelets: 174 10*3/uL (ref 150–400)
RDW: 17.7 % — ABNORMAL HIGH (ref 11.5–15.5)
WBC: 5 10*3/uL (ref 4.0–10.5)

## 2012-09-03 MED ORDER — GI COCKTAIL ~~LOC~~
30.0000 mL | Freq: Once | ORAL | Status: AC
  Administered 2012-09-03: 30 mL via ORAL
  Filled 2012-09-03: qty 30

## 2012-09-03 NOTE — Telephone Encounter (Signed)
Pt called, is 22 wks, states has been having chest pain since last pm, also experiencing some heart fluttering, happened after eating a piece of pork chop and could not sleep last pm.  Pt says feels like something is sitting on her chest.  Chest pain is constant, has not taken any meds prior to feeling this way, does feel some fetal mvmt, pt says she also feels jittery, had just started eating a bagel.  Per VL, have pt come in for eval in the office first.  Pt worked in w/ SR @ 1330 for eval.

## 2012-09-03 NOTE — MAU Note (Signed)
Patient states she was seen in the office today and sent to MAU for evaluation. Has been having chest pressure, heartburn. Having some cramping and pressure. Feels fetal movement, no bleeding or leaking.

## 2012-09-03 NOTE — Progress Notes (Signed)
[redacted]w[redacted]d Quad screen and Pap normal Chest pain like heaviness, worse when sitting still, improved by walking, continuous since last night Heart: normal rate and rythm Lungs: clear Chem 10: Suspect GI issue: will send to MAU for EKG,CMP and GI cocktail  CNM called and updated

## 2012-09-03 NOTE — MAU Note (Signed)
Respiratory in to perform EKG

## 2012-09-03 NOTE — ED Notes (Signed)
C/o L sided chest pressure since last night with sob.  Reports feeling lightheaded this morning.  Pt states she was sent from [facility] by POV for abnormal EKG.  [redacted] weeks pregnant. Denies nausea and vomiting.

## 2012-09-03 NOTE — Progress Notes (Addendum)
History   31 yo [id number] at 22 1/7 weeks presented from office for further evaluation of chest pain.  Had onset of chest pain/pressure last night, with inability to get comfortable during night.  Feels slightly short of breath, but denies cough, asthma, nasal congestion, fever,   Chief Complaint  Patient presents with  . Chest Pain   Patient Active Problem List  Diagnosis  . H/O pre-eclampsia in prior pregnancy, currently pregnant  . Chronic hypertension in pregnancy  . Hx LEEP (loop electrosurgical excision procedure), cervix, pregnancy  . Hx of postpartum depression, currently pregnant  . Late prenatal care  . Hx of precipitous labor and deliveries, antepartum  . 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 Anemia   Hx cardiac w/u in 2010 for tachycardia--per patient, all findings WNL.   OB History    Grav Para Term Preterm Abortions TAB SAB Ect Mult Living   7 3 3  3 1 1   3      Obstetric Comments   2011 PRECLAMPSIA PP; IN ICU X 3 DAYS      Past Medical History  Diagnosis Date  . Hx of breast reduction, elective   . Tachycardia   . Chlamydia infection   . BV (bacterial vaginosis) 2011  . H/O varicella   . Hx: UTI (urinary tract infection)   . Anemia   . H/O vitamin D deficiency   . CIN I (cervical intraepithelial neoplasia I) 2008  . Vulvitis 2009  . Dysuria 2010  . Pregnancy induced hypertension     ALL PREGNANCIES  . Infection     UTI DURING PREGNANCY  . Infection     YEAST WITH PREGNANCY  . Infection     CHLAMYDIA X 1  . Shortness of breath     WITH EXERTION SICNE PREGNANCY  . Headache     MIGRAINES  . Liver disease 2012    UNKOWN DIAGNOSIS  . Tachycardia 2010    HAS HAD WORKUP. UNSURE IF HAS POSSIBLE HEART VALVE ISSUE  . Dysrhythmia 2009    TACHYCARDIA;   HAD W/U 2010? POSSIBLE HEART VALVE ISSUE; NOT F/U NEEDED X 3 YEARS PER PT  . 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 Postpartum depression 2011    NO MEDS  . Low  iron     Past Surgical History  Procedure Date  . Dilation and curettage of uterus   . US echocardiography 06/18/2009    ef 55-60%  . Wisdom tooth extraction   . Breast reduction surgery 2008  . Finger surgery     Right index finger    Family History  Problem Relation Age of Onset  . Hypertension Mother   . Hyperlipidemia Mother   . Coronary artery disease Mother   . Heart disease Mother     high cholesterol  . Asthma Mother   . Diabetes Mother   . Hypertension Father   . Hypertension Maternal Grandmother   . Hypertension Maternal Grandfather   . Asthma Daughter     History  Substance Use Topics  . Smoking status: Passive Smoke Exposure - Never Smoker  . Smokeless tobacco: Never Used  . Alcohol Use: Yes     WEEKENDS -LIQUOR;  LAST DRANK 04/03/12    Allergies:  Allergies  Allergen Reactions  . Latex Itching    Prescriptions prior to admission  Medication Sig Dispense Refill  . clotrimazole-betamethasone (LOTRISONE) cream Apply topically daily. For 5 days.  30  g  0  . Prenatal Vit-Fe Fumarate-FA (PRENATAL MULTIVITAMIN) TABS Take 1 tablet by mouth daily.         Physical Exam   Blood pressure 114/77, pulse 90, temperature 98.2 F (36.8 C), temperature source Oral, resp. rate 16, height 5\' 2"  (1.575 m), weight 176 lb 12.8 oz (80.196 kg), last menstrual period 04/01/2012, SpO2 100.00%.  Chest clear Heart RRR without murmur Abd gravid, NT Pelvic--deferred Ext--no edema, DTR 2+ without clonus.  FHR 150s.  EKG:  NSR, abnormal ECG, cannot rule out inferior infarct  Results for orders placed during the hospital encounter of 09/03/12 (from the past 24 hour(s))  COMPREHENSIVE METABOLIC PANEL     Status: Abnormal   Collection Time   09/03/12  3:40 PM      Component Value Range   Sodium 134 (*) 135 - 145 mEq/L   Potassium 3.8  3.5 - 5.1 mEq/L   Chloride 102  96 - 112 mEq/L   CO2 23  19 - 32 mEq/L   Glucose, Bld 83  70 - 99 mg/dL   BUN 8  6 - 23 mg/dL    Creatinine, Ser 1.61  0.50 - 1.10 mg/dL   Calcium 9.3  8.4 - 09.6 mg/dL   Total Protein 6.6  6.0 - 8.3 g/dL   Albumin 3.0 (*) 3.5 - 5.2 g/dL   AST 12  0 - 37 U/L   ALT 8  0 - 35 U/L   Alkaline Phosphatase 74  39 - 117 U/L   Total Bilirubin 0.3  0.3 - 1.2 mg/dL   GFR calc non Af Amer >90  >90 mL/min   GFR calc Af Amer >90  >90 mL/min     ED Course  IUP at 22 1/7 weeks Chest pain/SOB Anemia Abnormal ECG  Plan: Consulted with Dr. Sabino Gasser cardiology consult. Check CBC--will await result before cardiology consult.    Nigel Bridgeman CNM, [place] 09/03/2012 4:54 PM   Addendum: Upon review of old prenatal records, noted patient had cardiology consult in 2010 due to tachycardia.  In discussing with patient, she reports she had a "leaky valve" and was instructed to f/u in 3 years for recheck.  Reports family hx of "leaky valves" in mother and grandmother. Saw cardiologist at [address] but doesn't remember specific name--apparently [facility] office.   CBC    Component Value Date/Time   WBC 5.0 09/03/2012 1545   RBC 3.77* 09/03/2012 1545   HGB 8.3* 09/03/2012 1545   HCT 26.6* 09/03/2012 1545   PLT 174 09/03/2012 1545   MCV 70.6* 09/03/2012 1545   MCH 22.0* 09/03/2012 1545   MCHC 31.2 09/03/2012 1545   RDW 17.7* 09/03/2012 1545   LYMPHSABS 1.4 06/28/2012 1455   MONOABS 0.4 06/28/2012 1455   EOSABS 0.0 06/28/2012 1455   BASOSABS 0.0 06/28/2012 1455   Called Dr. Mayford Knife, cardiologist on call for [facility]. She recommends patient be transported to [facility] ER for further work-up of symptoms.  Reviewed plan with patient--she advises she has to leave and pick up her children and arrange child care, then she affirms she will go to [facility]. Reviewed again recommendations for [facility] transport, but patient still advises she needs to take care of her children, then she will present to [facility]. AMA form reviewed with patient and signed by her.  Dr. Mayford Knife notified of patient  decision--she was already on her way to see the patient at [facility] ER, so she came to MAU and viewed the ECG.  Determined ECG was not c/w infarct, but still will expect to further evaluate patient in ER later tonight with echo, etc.   Nigel Bridgeman, CNM 09/03/12 5:45p

## 2012-09-03 NOTE — ED Provider Notes (Signed)
History     CSN: [id number]  Arrival date & time 09/03/12  1918   First MD Initiated Contact with Patient 09/03/12 2010      Chief Complaint  Patient presents with  . Chest Pain    (Consider location/radiation/quality/duration/timing/severity/associated sxs/prior treatment) Patient is a 31 y.o. female presenting with chest pain. The history is provided by the patient.  Chest Pain The chest pain began 12 - 24 hours ago. Chest pain occurs intermittently. The chest pain is unchanged. Associated with: laying flat. The severity of the pain is mild. The quality of the pain is described as heavy. The pain does not radiate. Exacerbated by: laying flat. Primary symptoms include shortness of breath. Pertinent negatives for primary symptoms include no fever, no fatigue, no syncope, no cough, no wheezing, no palpitations, no abdominal pain, no nausea, no vomiting, no dizziness and no altered mental status.  Pertinent negatives for associated symptoms include no lower extremity edema. She tried nothing for the symptoms.     Past Medical History  Diagnosis Date  . Hx of breast reduction, elective   . Tachycardia   . Chlamydia infection   . BV (bacterial vaginosis) 2011  . H/O varicella   . Hx: UTI (urinary tract infection)   . Anemia   . H/O vitamin D deficiency   . CIN I (cervical intraepithelial neoplasia I) 2008  . Vulvitis 2009  . Dysuria 2010  . Pregnancy induced hypertension     ALL PREGNANCIES  . Infection     UTI DURING PREGNANCY  . Infection     YEAST WITH PREGNANCY  . Infection     CHLAMYDIA X 1  . Shortness of breath     WITH EXERTION SICNE PREGNANCY  . Headache     MIGRAINES  . Liver disease 2012    UNKOWN DIAGNOSIS  . Tachycardia 2010    HAS HAD WORKUP. UNSURE IF HAS POSSIBLE HEART VALVE ISSUE  . Dysrhythmia 2009    TACHYCARDIA;   HAD W/U 2010? POSSIBLE HEART VALVE ISSUE; NOT F/U NEEDED X 3 YEARS PER PT  . LGSIL (low grade squamous intraepithelial dysplasia)  04/04/08    [facility]; LEEP; LAST PAP 05/2011  . Postpartum depression 2011    NO MEDS  . Low iron     Past Surgical History  Procedure Date  . Dilation and curettage of uterus   . US echocardiography 06/18/2009    ef 55-60%  . Wisdom tooth extraction   . Breast reduction surgery 2008  . Finger surgery     Right index finger    Family History  Problem Relation Age of Onset  . Hypertension Mother   . Hyperlipidemia Mother   . Coronary artery disease Mother   . Heart disease Mother     high cholesterol  . Asthma Mother   . Diabetes Mother   . Hypertension Father   . Hypertension Maternal Grandmother   . Hypertension Maternal Grandfather   . Asthma Daughter     History  Substance Use Topics  . Smoking status: Passive Smoke Exposure - Never Smoker  . Smokeless tobacco: Never Used  . Alcohol Use: Yes     WEEKENDS -LIQUOR;  LAST DRANK 04/03/12    OB History    Grav Para Term Preterm Abortions TAB SAB Ect Mult Living   7 3 3  3 1 1   3      Obstetric Comments   2011 PRECLAMPSIA PP; IN ICU X 3 DAYS  Review of Systems  Constitutional: Negative for fever and fatigue.  Respiratory: Positive for shortness of breath. Negative for cough and wheezing.   Cardiovascular: Positive for chest pain. Negative for palpitations, leg swelling and syncope.  Gastrointestinal: Negative for nausea, vomiting, abdominal pain and diarrhea.  Neurological: Negative for dizziness and headaches.  Psychiatric/Behavioral: Negative for altered mental status.  All other systems reviewed and are negative.    Allergies  Latex  Home Medications   Current Outpatient Rx  Name Route Sig Dispense Refill  . CLOTRIMAZOLE-BETAMETHASONE 1-0.05 % EX CREA Topical Apply topically daily. For 5 days. 30 g 0  . PRENATAL MULTIVITAMIN CH Oral Take 1 tablet by mouth daily.      BP 106/64  Pulse 94  Temp 98.3 F (36.8 C) (Oral)  Resp 18  SpO2 100%  LMP 04/01/2012  Physical Exam  Nursing note and vitals  reviewed. Constitutional: She is oriented to person, place, and time. She appears well-developed and well-nourished. No distress.  HENT:  Head: Normocephalic and atraumatic.  Eyes: EOM are normal. Pupils are equal, round, and reactive to light.  Neck: Normal range of motion.  Cardiovascular: Normal rate.   Murmur (2/5) heard. Pulmonary/Chest: Effort normal and breath sounds normal. No respiratory distress.  Abdominal: Soft. She exhibits no distension. There is no tenderness.  Musculoskeletal: Normal range of motion.  Neurological: She is alert and oriented to person, place, and time.  Skin: Skin is warm and dry.    ED Course  Procedures (including critical care time)   Labs Reviewed  POCT I-STAT TROPONIN I   No results found.   1. H/O pre-eclampsia in prior pregnancy, currently pregnant   2. Chest pain       MDM   8:21 PM Pt seen and examined. Pt with history of pre-eclampsia presents with intermittent chest pressure that started last night while she was laying flat. She states when she sits up and walks around that the pain improves. Pt was seen by her OB and then by [facility], then referred here. Pt is mildly tachycardic on exam. EKG does show Q wave and inverted T wave which can be seen in pregnancy. Due to patient's history of "leaky valves" cardiology was consulted prior to arrival and should be expecting the patient.   10:57 PM Spoke to Dr. Delene Loll who feels that the patient can have her echo done outpatient. Do not feel that the patient is having ischemic pain as her discomfort improves with exertion. First troponin (not ordered here) is negative. Patient is PERC negative and do not feel that presentation is consistent with PE.      Daleen Bo, MD 09/03/12 [phone]

## 2012-09-03 NOTE — MAU Note (Signed)
Patient states no change in level of discomfort after receiving GI Cocktail.

## 2012-09-03 NOTE — Progress Notes (Signed)
C/o been having chest pains and heart fluttering ,dizzy,and hard time breathing .

## 2012-09-04 NOTE — ED Provider Notes (Signed)
  I performed a history and physical examination of Patricia Lin and discussed her management with Dr. Aubery Lapping.  I agree with the history, physical, assessment, and plan of care, with the following exceptions: None  Case well described by Dr. Aubery Lapping.  On my exam the patient was resting comfortably, speaking clearly, and in no distress.  Patient's vital signs were appropriate for MID pregnancy.  EKG changes were appropriate for the pregnancy.  I discussed the case with cardiologist for assistance with prompt followup.  Given the absence of distress, the patient's PERC negative status, and the low suspicion for acute ongoing coronary pathology the patient was d/c to f/u promptly.  Cardiac: 90sr, normal  O2: 99% ra, normal   Date: 09/04/2012  Rate: 109  Rhythm: sinus tachycardia  QRS Axis: normal  Intervals: normal  ST/T Wave abnormalities: nonspecific T wave changes  Conduction Disutrbances:none  Narrative Interpretation:   Old EKG Reviewed: unchanged Borderline ecg   Patricia Lin, Elvis Coil, MD 09/04/12 [phone]

## 2012-09-06 ENCOUNTER — Ambulatory Visit (INDEPENDENT_AMBULATORY_CARE_PROVIDER_SITE_OTHER): Payer: 59

## 2012-09-06 ENCOUNTER — Other Ambulatory Visit: Payer: Self-pay

## 2012-09-06 DIAGNOSIS — O3430 Maternal care for cervical incompetence, unspecified trimester: Secondary | ICD-10-CM

## 2012-09-06 NOTE — Progress Notes (Signed)
Pt needs to fill out [facility] pregnancy form at next ROB.

## 2012-09-08 LAB — US OB LIMITED

## 2012-09-18 ENCOUNTER — Inpatient Hospital Stay (HOSPITAL_COMMUNITY)
Admission: AD | Admit: 2012-09-18 | Discharge: 2012-09-18 | Disposition: A | Discharge status: Home / Self Care | Payer: 59 | Source: Ambulatory Visit | Attending: Obstetrics and Gynecology | Admitting: Obstetrics and Gynecology

## 2012-09-18 ENCOUNTER — Encounter (HOSPITAL_COMMUNITY): Payer: Self-pay | Admitting: *Deleted

## 2012-09-18 ENCOUNTER — Telehealth: Payer: Self-pay | Admitting: Obstetrics and Gynecology

## 2012-09-18 ENCOUNTER — Inpatient Hospital Stay (HOSPITAL_COMMUNITY): Payer: 59

## 2012-09-18 DIAGNOSIS — Z8659 Personal history of other mental and behavioral disorders: Secondary | ICD-10-CM

## 2012-09-18 DIAGNOSIS — O2390 Unspecified genitourinary tract infection in pregnancy, unspecified trimester: Secondary | ICD-10-CM | POA: Diagnosis not present

## 2012-09-18 DIAGNOSIS — R109 Unspecified abdominal pain: Secondary | ICD-10-CM | POA: Diagnosis present

## 2012-09-18 DIAGNOSIS — O10919 Unspecified pre-existing hypertension complicating pregnancy, unspecified trimester: Secondary | ICD-10-CM

## 2012-09-18 DIAGNOSIS — B3731 Acute candidiasis of vulva and vagina: Secondary | ICD-10-CM | POA: Insufficient documentation

## 2012-09-18 DIAGNOSIS — N949 Unspecified condition associated with female genital organs and menstrual cycle: Secondary | ICD-10-CM | POA: Diagnosis not present

## 2012-09-18 DIAGNOSIS — O0930 Supervision of pregnancy with insufficient antenatal care, unspecified trimester: Secondary | ICD-10-CM

## 2012-09-18 DIAGNOSIS — N888 Other specified noninflammatory disorders of cervix uteri: Secondary | ICD-10-CM

## 2012-09-18 DIAGNOSIS — O3440 Maternal care for other abnormalities of cervix, unspecified trimester: Secondary | ICD-10-CM

## 2012-09-18 DIAGNOSIS — O09299 Supervision of pregnancy with other poor reproductive or obstetric history, unspecified trimester: Secondary | ICD-10-CM

## 2012-09-18 DIAGNOSIS — B373 Candidiasis of vulva and vagina: Secondary | ICD-10-CM | POA: Insufficient documentation

## 2012-09-18 DIAGNOSIS — F4541 Pain disorder exclusively related to psychological factors: Secondary | ICD-10-CM

## 2012-09-18 DIAGNOSIS — D649 Anemia, unspecified: Secondary | ICD-10-CM

## 2012-09-18 DIAGNOSIS — O09219 Supervision of pregnancy with history of pre-term labor, unspecified trimester: Secondary | ICD-10-CM

## 2012-09-18 DIAGNOSIS — O26879 Cervical shortening, unspecified trimester: Secondary | ICD-10-CM

## 2012-09-18 DIAGNOSIS — O479 False labor, unspecified: Secondary | ICD-10-CM

## 2012-09-18 LAB — WET PREP, GENITAL
Clue Cells Wet Prep HPF POC: NONE SEEN
Trich, Wet Prep: NONE SEEN

## 2012-09-18 LAB — URINALYSIS, ROUTINE W REFLEX MICROSCOPIC
Glucose, UA: NEGATIVE mg/dL
Hgb urine dipstick: NEGATIVE
Ketones, ur: 40 mg/dL — AB
Leukocytes, UA: NEGATIVE
pH: 6.5 (ref 5.0–8.0)

## 2012-09-18 MED ORDER — BETAMETHASONE SOD PHOS & ACET 6 (3-3) MG/ML IJ SUSP: 12.0000 mg | Freq: Once | INTRAMUSCULAR | Status: DC

## 2012-09-18 MED ORDER — BETAMETHASONE SOD PHOS & ACET 6 (3-3) MG/ML IJ SUSP
12.0000 mg | Freq: Once | INTRAMUSCULAR | Status: AC
  Administered 2012-09-18: 12 mg via INTRAMUSCULAR
  Filled 2012-09-18: qty 2

## 2012-09-18 MED ORDER — TERCONAZOLE 80 MG VA SUPP: 80.0000 mg | Freq: Every day | VAGINAL | Status: DC

## 2012-09-18 NOTE — MAU Note (Signed)
Pt presents with c/o leaking clear fluid since last Monday.  She has been wearing a pantyliner, that stays wet since last week.  She is also having some pinkish spotting that started today.  Mild cramping noted throughout pregnancy.  Reports good fetal movement.

## 2012-09-18 NOTE — Telephone Encounter (Signed)
TC from pt at 24wks reports lost mucous plug last week and today notices ?LOF, underpants are wet. No VB, +FM, denies ctx. Instructed to come to MAU for further eval

## 2012-09-18 NOTE — MAU Provider Note (Signed)
History     CSN: [id number]  Arrival date and time: 09/18/12 1358   None     Chief Complaint  Patient presents with  . Vaginal Discharge  . Abdominal Pain   HPI Comments: Pt is a [id number] at [redacted]w[redacted]d w c/o leaking? And discharge, no leaking now, not wearing a pad, denies any VB, states she's been having ctx entire pregnancy , GFM. No other c/o. Last had IC 1wk ago. Has been treated several times for chronic yeast. Was told she had a "short cervix" Last [place] on 9/24 cx was 4.22cm and 3.5cm w valsalva    Vaginal Discharge The patient's primary symptoms include a vaginal discharge. Pertinent negatives include no abdominal pain or dysuria.  Abdominal Pain Pertinent negatives include no dysuria.      Past Medical History  Diagnosis Date  . Hx of breast reduction, elective   . Tachycardia   . Chlamydia infection   . BV (bacterial vaginosis) 2011  . H/O varicella   . Hx: UTI (urinary tract infection)   . Anemia   . H/O vitamin D deficiency   . CIN I (cervical intraepithelial neoplasia I) 2008  . Vulvitis 2009  . Dysuria 2010  . Pregnancy induced hypertension     ALL PREGNANCIES  . Infection     UTI DURING PREGNANCY  . Infection     YEAST WITH PREGNANCY  . Infection     CHLAMYDIA X 1  . Shortness of breath     WITH EXERTION SICNE PREGNANCY  . Headache     MIGRAINES  . Liver disease 2012    UNKOWN DIAGNOSIS  . Tachycardia 2010    HAS HAD WORKUP. UNSURE IF HAS POSSIBLE HEART VALVE ISSUE  . Dysrhythmia 2009    TACHYCARDIA;   HAD W/U 2010? POSSIBLE HEART VALVE ISSUE; NOT F/U NEEDED X 3 YEARS PER PT  . 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 Postpartum depression 2011    NO MEDS  . Low iron     Past Surgical History  Procedure Date  . Dilation and curettage of uterus   . US echocardiography 06/18/2009    ef 55-60%  . Wisdom tooth extraction   . Breast reduction surgery 2008  . Finger surgery     Right index  finger    Family History  Problem Relation Age of Onset  . Hypertension Mother   . Hyperlipidemia Mother   . Coronary artery disease Mother   . Heart disease Mother     high cholesterol  . Asthma Mother   . Diabetes Mother   . Hypertension Father   . Hypertension Maternal Grandmother   . Hypertension Maternal Grandfather   . Asthma Daughter     History  Substance Use Topics  . Smoking status: Passive Smoke Exposure - Never Smoker  . Smokeless tobacco: Never Used  . Alcohol Use: Yes     WEEKENDS -LIQUOR;  LAST DRANK 04/03/12    Allergies:  Allergies  Allergen Reactions  . Latex Itching and Rash    Prescriptions prior to admission  Medication Sig Dispense Refill  . butalbital-acetaminophen-caffeine (FIORICET, ESGIC) 50-325-40 MG per tablet Take 1 tablet by mouth 2 (two) times daily as needed.      . clotrimazole-betamethasone (LOTRISONE) cream Apply topically daily. For 5 days.  30 g  0  . Prenatal Vit-Fe Fumarate-FA (PRENATAL MULTIVITAMIN) TABS Take 1 tablet by mouth daily.  Review of Systems  Gastrointestinal: Negative for abdominal pain.  Genitourinary: Positive for vaginal discharge. Negative for dysuria.       C/o ?discharge vs. Leaking?  All other systems reviewed and are negative.   Physical Exam   Blood pressure 118/74, pulse 83, temperature 98.1 F (36.7 C), temperature source Oral, resp. rate 16, height 5\' 1"  (1.549 m), weight 173 lb 3.2 oz (78.563 kg), last menstrual period 04/01/2012, SpO2 100.00%.  Physical Exam  Nursing note and vitals reviewed. Constitutional: She is oriented to person, place, and time. She appears well-developed and well-nourished.  HENT:  Head: Normocephalic.  Neck: Normal range of motion.  Cardiovascular: Normal rate, regular rhythm and normal heart sounds.   Respiratory: Effort normal and breath sounds normal.  GI: Soft. Bowel sounds are normal.  Genitourinary: Vaginal discharge found.       lg amt thick white/green  d/c vag wall erythematous, scant amt ?pooling cx is closed 50-60% high, no presenting part in pelvis   Musculoskeletal: Normal range of motion.  Neurological: She is alert and oriented to person, place, and time.  Skin: Skin is warm and dry.  Psychiatric: She has a normal mood and affect. Her behavior is normal.   FHR 140's reassuring for GA  toco - rare ctx  MAU Course  Procedures    Assessment and Plan  IUP at [redacted]w[redacted]d ?leaking Chronic yeast  amnisure GC/CT Wet prep FFN UA PO hydrate     Niketa Turner M 09/18/2012, 3:11 PM   (Labs are on paper due to downtime)  Amnisure neg Wet prep +yeast  UA neg, but concentrated  toco quiet FFN neg cx pending  Will do [place] for cervical length rx for yeast  D/w Dr Normand Sloop   Will d/c home if US WNL   Addendum: at 17  US shows somewhat dynamic cx w shortening to 2.7cm and funneling to int os.  D/w Dr Normand Sloop and will give BMZ course Pt to return tmrw evening for 2nd dose  D//W pt recommend resting as much as possible, continue w PO hydration  rv'd PTL s/s to call for Pt has appt in office on 10/22, will ask to add on limited US to recheck cervical length  Pt to return w worsening sx's terazol RX Pelvic rest

## 2012-09-18 NOTE — MAU Note (Signed)
Patient states she has been leaking clear fluid since 10-7 that keeps her panties wet. Not currently leaking and not wearing a pad. Reports abdominal cramping every day for entire pregnancy. Reports fetal movement.

## 2012-09-19 ENCOUNTER — Inpatient Hospital Stay (HOSPITAL_COMMUNITY)
Admission: AD | Admit: 2012-09-19 | Discharge: 2012-09-19 | Disposition: A | Discharge status: Hospice | Payer: 59 | Source: Ambulatory Visit | Attending: Obstetrics and Gynecology | Admitting: Obstetrics and Gynecology

## 2012-09-19 DIAGNOSIS — O09299 Supervision of pregnancy with other poor reproductive or obstetric history, unspecified trimester: Secondary | ICD-10-CM

## 2012-09-19 DIAGNOSIS — D649 Anemia, unspecified: Secondary | ICD-10-CM

## 2012-09-19 DIAGNOSIS — O4700 False labor before 37 completed weeks of gestation, unspecified trimester: Secondary | ICD-10-CM | POA: Insufficient documentation

## 2012-09-19 DIAGNOSIS — O3440 Maternal care for other abnormalities of cervix, unspecified trimester: Secondary | ICD-10-CM

## 2012-09-19 DIAGNOSIS — O26879 Cervical shortening, unspecified trimester: Secondary | ICD-10-CM

## 2012-09-19 DIAGNOSIS — O0930 Supervision of pregnancy with insufficient antenatal care, unspecified trimester: Secondary | ICD-10-CM

## 2012-09-19 DIAGNOSIS — F4541 Pain disorder exclusively related to psychological factors: Secondary | ICD-10-CM

## 2012-09-19 DIAGNOSIS — O09219 Supervision of pregnancy with history of pre-term labor, unspecified trimester: Secondary | ICD-10-CM

## 2012-09-19 DIAGNOSIS — N888 Other specified noninflammatory disorders of cervix uteri: Secondary | ICD-10-CM

## 2012-09-19 DIAGNOSIS — O10919 Unspecified pre-existing hypertension complicating pregnancy, unspecified trimester: Secondary | ICD-10-CM

## 2012-09-19 DIAGNOSIS — Z8659 Personal history of other mental and behavioral disorders: Secondary | ICD-10-CM

## 2012-09-19 MED ORDER — BETAMETHASONE SOD PHOS & ACET 6 (3-3) MG/ML IJ SUSP
12.0000 mg | Freq: Once | INTRAMUSCULAR | Status: AC
  Administered 2012-09-19: 12 mg via INTRAMUSCULAR
  Filled 2012-09-19: qty 2

## 2012-09-19 NOTE — MAU Note (Signed)
Pt here for second BMX injection. Denies bleeding, states continued pain."same pain"

## 2012-09-20 ENCOUNTER — Telehealth: Payer: Self-pay | Admitting: Obstetrics and Gynecology

## 2012-09-20 ENCOUNTER — Other Ambulatory Visit: Payer: Self-pay | Admitting: Obstetrics and Gynecology

## 2012-09-20 DIAGNOSIS — O3430 Maternal care for cervical incompetence, unspecified trimester: Secondary | ICD-10-CM

## 2012-09-20 LAB — GC/CHLAMYDIA PROBE AMP, GENITAL
Chlamydia, DNA Probe: NEGATIVE
GC Probe Amp, Genital: NEGATIVE

## 2012-09-20 NOTE — Telephone Encounter (Signed)
Returned pt's call. Informed of U/S appt 09/28/12. Pt verbalizes comprehension.

## 2012-09-20 NOTE — Telephone Encounter (Signed)
TC to pt. LM to return call.regarding appt.  

## 2012-09-20 NOTE — Telephone Encounter (Signed)
Message copied by Mason Jim on Mon Sep 20, 2012  9:46 AM ------      Message from: Malissa Hippo      Created: Sat Sep 18, 2012  7:22 PM      Regarding: can you please add on [place]        Pt is 24wks G4P3      Pt has appt on 10/22 w AR       She was seen on Sat to r/o PROM, FFN was neg      But cervix was noted to be shortened to 2.7cm w funneling      She just needs limited transvag to check cervical length      Please call her w time            Thanks!      SL

## 2012-09-27 ENCOUNTER — Emergency Department (HOSPITAL_COMMUNITY): Payer: No Typology Code available for payment source

## 2012-09-27 ENCOUNTER — Emergency Department (HOSPITAL_COMMUNITY)
Admission: EM | Admit: 2012-09-27 | Discharge: 2012-09-27 | Disposition: A | Discharge status: Home / Self Care | Payer: No Typology Code available for payment source | Attending: Emergency Medicine | Admitting: Emergency Medicine

## 2012-09-27 ENCOUNTER — Encounter (HOSPITAL_COMMUNITY): Payer: Self-pay | Admitting: Family Medicine

## 2012-09-27 DIAGNOSIS — O0930 Supervision of pregnancy with insufficient antenatal care, unspecified trimester: Secondary | ICD-10-CM

## 2012-09-27 DIAGNOSIS — R109 Unspecified abdominal pain: Secondary | ICD-10-CM | POA: Insufficient documentation

## 2012-09-27 DIAGNOSIS — S060XAA Concussion with loss of consciousness status unknown, initial encounter: Secondary | ICD-10-CM

## 2012-09-27 DIAGNOSIS — O26879 Cervical shortening, unspecified trimester: Secondary | ICD-10-CM

## 2012-09-27 DIAGNOSIS — S060X0A Concussion without loss of consciousness, initial encounter: Secondary | ICD-10-CM | POA: Insufficient documentation

## 2012-09-27 DIAGNOSIS — O3430 Maternal care for cervical incompetence, unspecified trimester: Secondary | ICD-10-CM | POA: Insufficient documentation

## 2012-09-27 DIAGNOSIS — O99019 Anemia complicating pregnancy, unspecified trimester: Secondary | ICD-10-CM | POA: Insufficient documentation

## 2012-09-27 DIAGNOSIS — Z87448 Personal history of other diseases of urinary system: Secondary | ICD-10-CM | POA: Insufficient documentation

## 2012-09-27 DIAGNOSIS — M79609 Pain in unspecified limb: Secondary | ICD-10-CM | POA: Insufficient documentation

## 2012-09-27 DIAGNOSIS — O26859 Spotting complicating pregnancy, unspecified trimester: Secondary | ICD-10-CM | POA: Insufficient documentation

## 2012-09-27 DIAGNOSIS — Z9889 Other specified postprocedural states: Secondary | ICD-10-CM | POA: Insufficient documentation

## 2012-09-27 DIAGNOSIS — Z87440 Personal history of urinary (tract) infections: Secondary | ICD-10-CM | POA: Insufficient documentation

## 2012-09-27 DIAGNOSIS — Z8659 Personal history of other mental and behavioral disorders: Secondary | ICD-10-CM

## 2012-09-27 DIAGNOSIS — Z8742 Personal history of other diseases of the female genital tract: Secondary | ICD-10-CM | POA: Insufficient documentation

## 2012-09-27 DIAGNOSIS — F4541 Pain disorder exclusively related to psychological factors: Secondary | ICD-10-CM

## 2012-09-27 DIAGNOSIS — O09299 Supervision of pregnancy with other poor reproductive or obstetric history, unspecified trimester: Secondary | ICD-10-CM

## 2012-09-27 DIAGNOSIS — Y9389 Activity, other specified: Secondary | ICD-10-CM | POA: Insufficient documentation

## 2012-09-27 DIAGNOSIS — S060X9A Concussion with loss of consciousness of unspecified duration, initial encounter: Secondary | ICD-10-CM

## 2012-09-27 DIAGNOSIS — O3440 Maternal care for other abnormalities of cervix, unspecified trimester: Secondary | ICD-10-CM

## 2012-09-27 DIAGNOSIS — N888 Other specified noninflammatory disorders of cervix uteri: Secondary | ICD-10-CM

## 2012-09-27 DIAGNOSIS — D649 Anemia, unspecified: Secondary | ICD-10-CM

## 2012-09-27 DIAGNOSIS — O10919 Unspecified pre-existing hypertension complicating pregnancy, unspecified trimester: Secondary | ICD-10-CM

## 2012-09-27 DIAGNOSIS — Z8679 Personal history of other diseases of the circulatory system: Secondary | ICD-10-CM | POA: Insufficient documentation

## 2012-09-27 DIAGNOSIS — O09219 Supervision of pregnancy with history of pre-term labor, unspecified trimester: Secondary | ICD-10-CM

## 2012-09-27 LAB — COMPREHENSIVE METABOLIC PANEL
ALT: 16 U/L (ref 0–35)
AST: 16 U/L (ref 0–37)
Albumin: 3 g/dL — ABNORMAL LOW (ref 3.5–5.2)
Calcium: 9.3 mg/dL (ref 8.4–10.5)
Creatinine, Ser: 0.57 mg/dL (ref 0.50–1.10)
Sodium: 135 mEq/L (ref 135–145)
Total Protein: 7.3 g/dL (ref 6.0–8.3)

## 2012-09-27 LAB — CBC WITH DIFFERENTIAL/PLATELET
Basophils Absolute: 0 10*3/uL (ref 0.0–0.1)
Basophils Relative: 0 % (ref 0–1)
Eosinophils Absolute: 0.1 10*3/uL (ref 0.0–0.7)
Eosinophils Relative: 1 % (ref 0–5)
Lymphocytes Relative: 37 % (ref 12–46)
MCHC: 31.4 g/dL (ref 30.0–36.0)
MCV: 70.6 fL — ABNORMAL LOW (ref 78.0–100.0)
Monocytes Absolute: 0.4 10*3/uL (ref 0.1–1.0)
Platelets: 205 10*3/uL (ref 150–400)
RDW: 17.2 % — ABNORMAL HIGH (ref 11.5–15.5)
WBC: 5.4 10*3/uL (ref 4.0–10.5)

## 2012-09-27 LAB — ABO/RH: ABO/RH(D): A POS

## 2012-09-27 LAB — TYPE AND SCREEN: Antibody Screen: NEGATIVE

## 2012-09-27 MED ORDER — SODIUM CHLORIDE 0.9 % IV BOLUS (SEPSIS)
1000.0000 mL | Freq: Once | INTRAVENOUS | Status: AC
  Administered 2012-09-27: 1000 mL via INTRAVENOUS

## 2012-09-27 MED ORDER — METOCLOPRAMIDE HCL 5 MG/ML IJ SOLN
10.0000 mg | Freq: Once | INTRAMUSCULAR | Status: AC
  Administered 2012-09-27: 10 mg via INTRAVENOUS
  Filled 2012-09-27: qty 2

## 2012-09-27 MED ORDER — ACETAMINOPHEN 500 MG PO TABS
1000.0000 mg | ORAL_TABLET | Freq: Once | ORAL | Status: AC
  Administered 2012-09-27: 1000 mg via ORAL
  Filled 2012-09-27: qty 2

## 2012-09-27 MED ORDER — METOCLOPRAMIDE HCL 10 MG PO TABS: 10.0000 mg | ORAL_TABLET | Freq: Four times a day (QID) | ORAL | Status: DC | PRN

## 2012-09-27 NOTE — Procedures (Signed)
EFM reapplied after return from XRAY. Patient states she felt gush of fluid from vaginal while in XRAY. Glistening noted to perineum.

## 2012-09-27 NOTE — Progress Notes (Signed)
Dr. Estanislado Pandy called regarding EFM tracing for 4 hours of category I. FHR 135 with moderate variability, accels present, and no decels. No UC noted. Dr. Estanislado Pandy stated once ED cleared pt may be d/c home with scheduled appt tomorrow with OB office.

## 2012-09-27 NOTE — ED Notes (Signed)
Patient transported to X-ray 

## 2012-09-27 NOTE — ED Notes (Signed)
Pt up and walking to the restroom .

## 2012-09-27 NOTE — Progress Notes (Signed)
EFM applied. Not able to access [employer] and [employer], so therefore, using paper tracing. Second monitor brought into the room and able to access [employer] and [employer].
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
## 2012-09-27 NOTE — Progress Notes (Signed)
Orthopedic Tech Progress Note Patient Details:  Patricia Lin 01/05/1981 [id number]  Ortho Devices Type of Ortho Device: Arm foam sling;Sugartong splint Ortho Device/Splint Location: left arm Ortho Device/Splint Interventions: Application   Patricia Lin 09/27/2012, 4:12 PM

## 2012-09-27 NOTE — ED Notes (Signed)
Patient transported to CT 
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
## 2012-09-27 NOTE — Progress Notes (Signed)
Patient denies any additional gushes.

## 2012-09-27 NOTE — Progress Notes (Signed)
Patient off monitor for transport to radiology.

## 2012-09-27 NOTE — Progress Notes (Signed)
Report received from Metta Clines, RN. OB Care resumed
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
## 2012-09-27 NOTE — ED Notes (Signed)
[facility] at bedside. Pt on the fetal monitor.

## 2012-09-27 NOTE — Progress Notes (Signed)
RROB RN spoke with Dr. Estanislado Pandy. Patient will need 4 hours of fetal monitoring. Once patient has been medically cleared she can be transferred to [facility] to complete her monitoring.

## 2012-09-28 ENCOUNTER — Ambulatory Visit (INDEPENDENT_AMBULATORY_CARE_PROVIDER_SITE_OTHER): Payer: 59 | Admitting: Obstetrics and Gynecology

## 2012-09-28 ENCOUNTER — Ambulatory Visit (INDEPENDENT_AMBULATORY_CARE_PROVIDER_SITE_OTHER): Payer: 59

## 2012-09-28 ENCOUNTER — Encounter: Payer: Self-pay | Admitting: Obstetrics and Gynecology

## 2012-09-28 VITALS — BP 100/64 | Wt 176.0 lb

## 2012-09-28 DIAGNOSIS — O480 Post-term pregnancy: Secondary | ICD-10-CM

## 2012-09-28 DIAGNOSIS — O3430 Maternal care for cervical incompetence, unspecified trimester: Secondary | ICD-10-CM

## 2012-09-28 NOTE — Progress Notes (Addendum)
[redacted]w[redacted]d glucola at [place] Cervix closed and ?50% Pt is s/p BMZ CVX 3.49cm RTO 2wks

## 2012-10-01 LAB — US OB TRANSVAGINAL

## 2012-10-05 ENCOUNTER — Telehealth: Payer: Self-pay | Admitting: Obstetrics and Gynecology
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
## 2012-10-05 NOTE — Telephone Encounter (Signed)
Lm on vm for pt to call back.

## 2012-10-08 ENCOUNTER — Telehealth: Payer: Self-pay | Admitting: Obstetrics and Gynecology

## 2012-10-08 NOTE — Telephone Encounter (Signed)
Tc to pt regarding msg, informed pt per AR ok to take Tramadol 50 mg, pt voices understanding.

## 2012-10-11 NOTE — Telephone Encounter (Signed)
Note to close enc. Patricia Lin, Patricia Lin  

## 2012-10-15 ENCOUNTER — Other Ambulatory Visit: Payer: 59

## 2012-10-15 ENCOUNTER — Ambulatory Visit (INDEPENDENT_AMBULATORY_CARE_PROVIDER_SITE_OTHER): Payer: 59 | Admitting: Obstetrics and Gynecology

## 2012-10-15 VITALS — BP 110/60 | Wt 179.0 lb

## 2012-10-15 DIAGNOSIS — Z331 Pregnant state, incidental: Secondary | ICD-10-CM

## 2012-10-15 DIAGNOSIS — Z3490 Encounter for supervision of normal pregnancy, unspecified, unspecified trimester: Secondary | ICD-10-CM

## 2012-10-15 NOTE — Addendum Note (Signed)
Addended by: Marla Roe A on: 10/15/2012 04:42 PM   Modules accepted: Orders
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
## 2012-10-16 LAB — CBC
HCT: 26.2 % — ABNORMAL LOW (ref 36.0–46.0)
Hemoglobin: 8.3 g/dL — ABNORMAL LOW (ref 12.0–15.0)
MCH: 21.7 pg — ABNORMAL LOW (ref 26.0–34.0)
MCHC: 31.7 g/dL (ref 30.0–36.0)
RBC: 3.82 MIL/uL — ABNORMAL LOW (ref 3.87–5.11)

## 2012-10-17 LAB — GLUCOSE TOLERANCE, 1 HOUR

## 2012-10-19 ENCOUNTER — Telehealth: Payer: Self-pay

## 2012-10-19 NOTE — Telephone Encounter (Signed)
Spoke to pt to let her know that her iron was quite low at 8.3. She was encouraged to take iron supps bid, and also a stool softener OTC, due to the constipation the iron may cause.  Pt is agreeable. Melody Comas A

## 2012-10-23 ENCOUNTER — Inpatient Hospital Stay (HOSPITAL_COMMUNITY)
Admission: AD | Admit: 2012-10-23 | Discharge: 2012-10-23 | Disposition: A | Discharge status: Home / Self Care | Payer: 59 | Source: Ambulatory Visit | Attending: Obstetrics and Gynecology | Admitting: Obstetrics and Gynecology

## 2012-10-23 ENCOUNTER — Encounter (HOSPITAL_COMMUNITY): Payer: Self-pay | Admitting: *Deleted

## 2012-10-23 DIAGNOSIS — M899 Disorder of bone, unspecified: Secondary | ICD-10-CM

## 2012-10-23 DIAGNOSIS — O0930 Supervision of pregnancy with insufficient antenatal care, unspecified trimester: Secondary | ICD-10-CM

## 2012-10-23 DIAGNOSIS — O10919 Unspecified pre-existing hypertension complicating pregnancy, unspecified trimester: Secondary | ICD-10-CM

## 2012-10-23 DIAGNOSIS — O4700 False labor before 37 completed weeks of gestation, unspecified trimester: Secondary | ICD-10-CM | POA: Insufficient documentation

## 2012-10-23 DIAGNOSIS — O26879 Cervical shortening, unspecified trimester: Secondary | ICD-10-CM

## 2012-10-23 DIAGNOSIS — O3440 Maternal care for other abnormalities of cervix, unspecified trimester: Secondary | ICD-10-CM

## 2012-10-23 DIAGNOSIS — O09299 Supervision of pregnancy with other poor reproductive or obstetric history, unspecified trimester: Secondary | ICD-10-CM

## 2012-10-23 DIAGNOSIS — O09219 Supervision of pregnancy with history of pre-term labor, unspecified trimester: Secondary | ICD-10-CM

## 2012-10-23 DIAGNOSIS — M259 Joint disorder, unspecified: Secondary | ICD-10-CM

## 2012-10-23 DIAGNOSIS — O9989 Other specified diseases and conditions complicating pregnancy, childbirth and the puerperium: Secondary | ICD-10-CM

## 2012-10-23 DIAGNOSIS — O99891 Other specified diseases and conditions complicating pregnancy: Secondary | ICD-10-CM | POA: Insufficient documentation

## 2012-10-23 DIAGNOSIS — Z8659 Personal history of other mental and behavioral disorders: Secondary | ICD-10-CM

## 2012-10-23 DIAGNOSIS — D649 Anemia, unspecified: Secondary | ICD-10-CM

## 2012-10-23 DIAGNOSIS — N888 Other specified noninflammatory disorders of cervix uteri: Secondary | ICD-10-CM

## 2012-10-23 DIAGNOSIS — F4541 Pain disorder exclusively related to psychological factors: Secondary | ICD-10-CM

## 2012-10-23 DIAGNOSIS — S52202A Unspecified fracture of shaft of left ulna, initial encounter for closed fracture: Secondary | ICD-10-CM

## 2012-10-23 DIAGNOSIS — M5430 Sciatica, unspecified side: Secondary | ICD-10-CM | POA: Insufficient documentation

## 2012-10-23 LAB — URINALYSIS, ROUTINE W REFLEX MICROSCOPIC
Glucose, UA: NEGATIVE mg/dL
Hgb urine dipstick: NEGATIVE
Leukocytes, UA: NEGATIVE
Protein, ur: NEGATIVE mg/dL
Specific Gravity, Urine: >1.03 — ABNORMAL HIGH (ref 1.005–1.030)
Urobilinogen, UA: 0.2 mg/dL (ref 0.0–1.0)

## 2012-10-23 LAB — WET PREP, GENITAL

## 2012-10-23 LAB — FETAL FIBRONECTIN: Fetal Fibronectin: NEGATIVE

## 2012-10-23 MED ORDER — NIFEDIPINE 10 MG PO CAPS: 10.0000 mg | ORAL_CAPSULE | ORAL | Status: DC | PRN

## 2012-10-23 MED ORDER — NIFEDIPINE 10 MG PO CAPS
10.0000 mg | ORAL_CAPSULE | ORAL | Status: DC | PRN
  Administered 2012-10-23: 10 mg via ORAL
  Filled 2012-10-23: qty 1

## 2012-10-23 MED ORDER — CYCLOBENZAPRINE HCL 10 MG PO TABS
10.0000 mg | ORAL_TABLET | Freq: Once | ORAL | Status: AC
  Administered 2012-10-23: 10 mg via ORAL
  Filled 2012-10-23: qty 1

## 2012-10-23 NOTE — MAU Provider Note (Signed)
History   31 yo [id number] at 29 2/7 weeks presented unannounced c/o contractions "x several weeks, slightly stronger today", but really presented due to pain in right hip.  Denies vaginal d/c, bleeding, recent IC, dysuria, or other back pain.    "Just ready to have this baby".  Recent fracture of right forearm--currently in cast.  Patient Active Problem List  Diagnosis  . H/O pre-eclampsia in prior pregnancy, currently pregnant  . Chronic hypertension in pregnancy  . Hx LEEP (loop electrosurgical excision procedure), cervix, pregnancy  . Hx of postpartum depression, currently pregnant  . Late prenatal care  . Hx of precipitous labor and deliveries, antepartum  . 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 Anemia  . Cervical funneling  . Cervical shortening  . GBS (group B Streptococcus carrier), +RV culture, currently pregnant  Hx notable for BMZ on 10/12 and 10/13 due to cervical funneling at 24 weeks. Negative FFN 09/18/12. Cervix has been closed, 50% on last exam 10/15/12.   Chief Complaint  Patient presents with  . Labor Eval    OB History    Grav Para Term Preterm Abortions TAB SAB Ect Mult Living   7 3 3  3 1 1   3      Obstetric Comments   2011 PRECLAMPSIA PP; IN ICU X 3 DAYS      Past Medical History  Diagnosis Date  . Hx of breast reduction, elective   . Tachycardia   . Chlamydia infection   . BV (bacterial vaginosis) 2011  . H/O varicella   . Hx: UTI (urinary tract infection)   . Anemia   . H/O vitamin D deficiency   . CIN I (cervical intraepithelial neoplasia I) 2008  . Vulvitis 2009  . Dysuria 2010  . Pregnancy induced hypertension     ALL PREGNANCIES  . Infection     UTI DURING PREGNANCY  . Infection     YEAST WITH PREGNANCY  . Infection     CHLAMYDIA X 1  . Shortness of breath     WITH EXERTION SICNE PREGNANCY  . Headache     MIGRAINES  . Liver disease 2012    UNKOWN DIAGNOSIS  . Tachycardia 2010    HAS HAD WORKUP. UNSURE IF HAS POSSIBLE HEART VALVE ISSUE  .  Dysrhythmia 2009    TACHYCARDIA;   HAD W/U 2010? POSSIBLE HEART VALVE ISSUE; NOT F/U NEEDED X 3 YEARS PER PT  . 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 Postpartum depression 2011    NO MEDS  . Low iron     Past Surgical History  Procedure Date  . Dilation and curettage of uterus   . US echocardiography 06/18/2009    ef 55-60%  . Wisdom tooth extraction   . Breast reduction surgery 2008  . Finger surgery     Right index finger    Family History  Problem Relation Age of Onset  . Hypertension Mother   . Hyperlipidemia Mother   . Coronary artery disease Mother   . Heart disease Mother     high cholesterol  . Asthma Mother   . Diabetes Mother   . Hypertension Father   . Hypertension Maternal Grandmother   . Hypertension Maternal Grandfather   . Asthma Daughter     History  Substance Use Topics  . Smoking status: Passive Smoke Exposure - Never Smoker  . Smokeless tobacco: Never Used  . Alcohol Use: Yes  Comment: WEEKENDS -LIQUOR;  LAST DRANK 04/03/12    Allergies:  Allergies  Allergen Reactions  . Latex Itching and Rash    No prescriptions prior to admission     Physical Exam   Blood pressure 129/77, pulse 105, temperature 98.1 F (36.7 C), temperature source Oral, resp. rate 16, height 5\' 1"  (1.549 m), weight 176 lb (79.833 kg), last menstrual period 04/01/2012.  Chest clear Heart RRR without mumur Abd gravid, NT Pelvic--cervix FT, firm, 50%, vtx ballotable, cervix posterior. Ext WNL  FHR Category 1 UCs initially q 4-5 min--received single dose of Procardia with resolution of UCs.  Results for orders placed during the hospital encounter of 10/23/12 (from the past 24 hour(s))  URINALYSIS, ROUTINE W REFLEX MICROSCOPIC     Status: Abnormal   Collection Time   10/23/12  5:33 PM      Component Value Range   Color, Urine YELLOW  YELLOW   APPearance CLEAR  CLEAR   Specific Gravity, Urine >1.030 (*)  1.005 - 1.030   pH 6.0  5.0 - 8.0   Glucose, UA NEGATIVE  NEGATIVE mg/dL   Hgb urine dipstick NEGATIVE  NEGATIVE   Bilirubin Urine NEGATIVE  NEGATIVE   Ketones, ur >80 (*) NEGATIVE mg/dL   Protein, ur NEGATIVE  NEGATIVE mg/dL   Urobilinogen, UA 0.2  0.0 - 1.0 mg/dL   Nitrite NEGATIVE  NEGATIVE   Leukocytes, UA NEGATIVE  NEGATIVE  FETAL FIBRONECTIN     Status: Normal   Collection Time   10/23/12  5:45 PM      Component Value Range   Fetal Fibronectin NEGATIVE  NEGATIVE  WET PREP, GENITAL     Status: Abnormal   Collection Time   10/23/12  5:45 PM      Component Value Range   Yeast Wet Prep HPF POC FEW (*) NONE SEEN   Trich, Wet Prep NONE SEEN  NONE SEEN   Clue Cells Wet Prep HPF POC NONE SEEN  NONE SEEN   WBC, Wet Prep HPF POC FEW (*) NONE SEEN   GC, chlamydia done.  ED Course  IUP at 29 2/7 weeks PT UC, without significant cervical change, negative FFN Right side sciatica  Plan: D/C home, with Procardia 10 mg po q 4-6 hours prn contractions. Reviewed impact of PTD on infant status. Comfort measures reviewed for sciatica--patient currently sees a chiropractor, will f/u with them for that complaint.   Nigel Bridgeman CNM, [place] 10/23/2012 8:02 PM

## 2012-10-23 NOTE — MAU Note (Signed)
Has been having contractions throughout, but have felt "strong and more often now", this has been going on since before last visit- refused to be checked that day. No hx of PTL or delivery.  Rt leg has been going out, started on Wed. (gait is steady,even)

## 2012-10-23 NOTE — Progress Notes (Signed)
Manfred Arch notified of pts contraction pattern, FHR, states she will come and evaluate pt.

## 2012-10-28 ENCOUNTER — Encounter: Payer: Self-pay | Admitting: Obstetrics and Gynecology

## 2012-10-28 ENCOUNTER — Ambulatory Visit (INDEPENDENT_AMBULATORY_CARE_PROVIDER_SITE_OTHER): Payer: 59 | Admitting: Obstetrics and Gynecology

## 2012-10-28 VITALS — BP 120/80 | Wt 176.0 lb

## 2012-10-28 DIAGNOSIS — Z331 Pregnant state, incidental: Secondary | ICD-10-CM

## 2012-10-28 NOTE — Progress Notes (Signed)
Pt without complaint today, declines flu shot.  

## 2012-10-28 NOTE — Progress Notes (Signed)
[redacted]w[redacted]d Pt without c/o She desires PPTL R&B reviewed.  Papers need to be signed today FKC reviewed Circ reviewed

## 2012-10-28 NOTE — Patient Instructions (Signed)
Postpartum Tubal Ligation  A postpartum tubal ligation (PPTL) is a procedure that blocks the fallopian tubes right after childbirth or 1 2 days after childbirth. PPTL is done before the uterus returns to its normal location. The procedure is also called a minilaparotomy. By blocking the fallopian tubes, the eggs that are released from the ovaries cannot enter the uterus and sperm cannot reach the egg. A PPTL is done so you will not be able to get pregnant or have a baby.   Although this procedure may be reversed, it should be considered permanent and irreversible. If you want to have future pregnancies, you should not have this procedure.  LET YOUR CAREGIVER KNOW ABOUT:   Allergies to food or medicine.   Medicines taken, including vitamins, herbs, eyedrops, over-the-counter medicines, and creams.   Use of steroids (by mouth or creams).   Previous problems with numbing medicines.   History of bleeding problems or blood clots.   Any recent colds or infections.   Previous surgery.   Other health problems, including diabetes and kidney problems.  RISKS AND COMPLICATIONS   Infection.   Bleeding.   Injury to other organs.   Anesthetic side effects.   Failure of the procedure.   Ectopic pregnancy.   Future regret about having the procedure done.  BEFORE THE PROCEDURE    You may need to sign certain permission forms with your insurance up to 30 days before your due date.   After delivering your baby, you cannot eat or drink anything if the procedure is performed the same day. If you are having the procedure a day after delivering, you may be able to eat and drink until midnight. Your caregiver will give you specific directions depending on your situation.  PROCEDURE    If done 1 2 days after delivery:   You will be given a medicine to make you sleep (general anesthetic) during the procedure.   A tube will be put down your throat to help your breath while under general anesthesia.   A small cut  (incision) is made just beneath the belly button.   The fallopian tubes are brought up through the incision.   The fallopian tubes are then sealed, tied, or cut.   If done after a caesarean delivery:   Tubal ligation is done through the incision already made (after the baby is delivered).   Once the tubes are blocked, the incision is closed with stitches (sutures). A bandage will be placed over the incisions.  AFTER THE PROCEDURE   You may have some pain or cramps in the abdominal area for the next 3 7 days.   You will be given pain medicine to ease any discomfort.   You may also feel sick to your stomach if you were given a general anesthetic.   You may have some mild discomfort in the throat. This is from the tube that may have been placed in your throat while you were sleeping.   You may feel tired and should rest the remainder of the day.  Document Released: 11/24/2005 Document Revised: 05/25/2012 Document Reviewed: 03/06/2012  [employer] Patient Information 2013 [employer]

## 2012-11-07 DIAGNOSIS — O139 Gestational [pregnancy-induced] hypertension without significant proteinuria, unspecified trimester: Secondary | ICD-10-CM

## 2012-11-07 HISTORY — DX: Gestational (pregnancy-induced) hypertension without significant proteinuria, unspecified trimester: O13.9

## 2012-11-16 ENCOUNTER — Other Ambulatory Visit: Payer: 59

## 2012-11-16 ENCOUNTER — Encounter: Payer: 59 | Admitting: Obstetrics and Gynecology

## 2012-11-17 ENCOUNTER — Ambulatory Visit (INDEPENDENT_AMBULATORY_CARE_PROVIDER_SITE_OTHER): Payer: 59

## 2012-11-17 ENCOUNTER — Ambulatory Visit (INDEPENDENT_AMBULATORY_CARE_PROVIDER_SITE_OTHER): Payer: 59 | Admitting: Obstetrics and Gynecology

## 2012-11-17 VITALS — BP 120/70 | Wt 178.0 lb

## 2012-11-17 DIAGNOSIS — Z331 Pregnant state, incidental: Secondary | ICD-10-CM

## 2012-11-17 DIAGNOSIS — O26879 Cervical shortening, unspecified trimester: Secondary | ICD-10-CM

## 2012-11-17 NOTE — Progress Notes (Signed)
[redacted]w[redacted]d Pt got u/s and left said she couldn't stay U/S EFW 4-9, AFI 15.7 55th%, cvx 2.9cm, vtx, ant placenta

## 2012-11-18 LAB — US OB FOLLOW UP

## 2012-12-02 ENCOUNTER — Encounter: Payer: 59 | Admitting: Obstetrics and Gynecology

## 2012-12-03 ENCOUNTER — Ambulatory Visit (INDEPENDENT_AMBULATORY_CARE_PROVIDER_SITE_OTHER): Payer: 59 | Admitting: Obstetrics and Gynecology

## 2012-12-03 ENCOUNTER — Encounter: Payer: Self-pay | Admitting: Obstetrics and Gynecology

## 2012-12-03 VITALS — BP 104/70 | Wt 182.0 lb

## 2012-12-03 DIAGNOSIS — N898 Other specified noninflammatory disorders of vagina: Secondary | ICD-10-CM

## 2012-12-03 DIAGNOSIS — Z331 Pregnant state, incidental: Secondary | ICD-10-CM

## 2012-12-03 LAB — POCT WET PREP (WET MOUNT)
Clue Cells Wet Prep Whiff POC: NEGATIVE
pH: 5

## 2012-12-03 MED ORDER — CLOTRIMAZOLE 1 % VA CREA: 1.0000 | TOPICAL_CREAM | Freq: Two times a day (BID) | VAGINAL | Status: AC

## 2012-12-03 NOTE — Progress Notes (Signed)
Pt states she thinks she has a yeast infection. 

## 2012-12-03 NOTE — Patient Instructions (Signed)
Fetal Movement Counts Patient Name: __________________________________________________ Patient Due Date: ____________________ Kick counts is highly recommended in high risk pregnancies, but it is a good idea for every pregnant woman to do. Start counting fetal movements at 28 weeks of the pregnancy. Fetal movements increase after eating a full meal or eating or drinking something sweet (the blood sugar is higher). It is also important to drink plenty of fluids (well hydrated) before doing the count. Lie on your left side because it helps with the circulation or you can sit in a comfortable chair with your arms over your belly (abdomen) with no distractions around you. DOING THE COUNT  Try to do the count the same time of day each time you do it.  Mark the day and time, then see how Mikels it takes for you to feel 10 movements (kicks, flutters, swishes, rolls). You should have at least 10 movements within 2 hours. You will most likely feel 10 movements in much less than 2 hours. If you do not, wait an hour and count again. After a couple of days you will see a pattern.  What you are looking for is a change in the pattern or not enough counts in 2 hours. Is it taking longer in time to reach 10 movements? SEEK MEDICAL CARE IF:  You feel less than 10 counts in 2 hours. Tried twice.  No movement in one hour.  The pattern is changing or taking longer each day to reach 10 counts in 2 hours.  You feel the baby is not moving as it usually does. Date: ____________ Movements: ____________ Start time: ____________ Finish time: ____________  Date: ____________ Movements: ____________ Start time: ____________ Finish time: ____________ Date: ____________ Movements: ____________ Start time: ____________ Finish time: ____________ Date: ____________ Movements: ____________ Start time: ____________ Finish time: ____________ Date: ____________ Movements: ____________ Start time: ____________ Finish time:  ____________ Date: ____________ Movements: ____________ Start time: ____________ Finish time: ____________ Date: ____________ Movements: ____________ Start time: ____________ Finish time: ____________ Date: ____________ Movements: ____________ Start time: ____________ Finish time: ____________  Date: ____________ Movements: ____________ Start time: ____________ Finish time: ____________ Date: ____________ Movements: ____________ Start time: ____________ Finish time: ____________ Date: ____________ Movements: ____________ Start time: ____________ Finish time: ____________ Date: ____________ Movements: ____________ Start time: ____________ Finish time: ____________ Date: ____________ Movements: ____________ Start time: ____________ Finish time: ____________ Date: ____________ Movements: ____________ Start time: ____________ Finish time: ____________ Date: ____________ Movements: ____________ Start time: ____________ Finish time: ____________  Date: ____________ Movements: ____________ Start time: ____________ Finish time: ____________ Date: ____________ Movements: ____________ Start time: ____________ Finish time: ____________ Date: ____________ Movements: ____________ Start time: ____________ Finish time: ____________ Date: ____________ Movements: ____________ Start time: ____________ Finish time: ____________ Date: ____________ Movements: ____________ Start time: ____________ Finish time: ____________ Date: ____________ Movements: ____________ Start time: ____________ Finish time: ____________ Date: ____________ Movements: ____________ Start time: ____________ Finish time: ____________  Date: ____________ Movements: ____________ Start time: ____________ Finish time: ____________ Date: ____________ Movements: ____________ Start time: ____________ Finish time: ____________ Date: ____________ Movements: ____________ Start time: ____________ Finish time: ____________ Date: ____________ Movements:  ____________ Start time: ____________ Finish time: ____________ Date: ____________ Movements: ____________ Start time: ____________ Finish time: ____________ Date: ____________ Movements: ____________ Start time: ____________ Finish time: ____________ Date: ____________ Movements: ____________ Start time: ____________ Finish time: ____________  Date: ____________ Movements: ____________ Start time: ____________ Finish time: ____________ Date: ____________ Movements: ____________ Start time: ____________ Finish time: ____________ Date: ____________ Movements: ____________ Start time: ____________ Finish time: ____________ Date: ____________ Movements:   ____________ Start time: ____________ Finish time: ____________ Date: ____________ Movements: ____________ Start time: ____________ Finish time: ____________ Date: ____________ Movements: ____________ Start time: ____________ Finish time: ____________ Date: ____________ Movements: ____________ Start time: ____________ Finish time: ____________  Date: ____________ Movements: ____________ Start time: ____________ Finish time: ____________ Date: ____________ Movements: ____________ Start time: ____________ Finish time: ____________ Date: ____________ Movements: ____________ Start time: ____________ Finish time: ____________ Date: ____________ Movements: ____________ Start time: ____________ Finish time: ____________ Date: ____________ Movements: ____________ Start time: ____________ Finish time: ____________ Date: ____________ Movements: ____________ Start time: ____________ Finish time: ____________ Date: ____________ Movements: ____________ Start time: ____________ Finish time: ____________  Date: ____________ Movements: ____________ Start time: ____________ Finish time: ____________ Date: ____________ Movements: ____________ Start time: ____________ Finish time: ____________ Date: ____________ Movements: ____________ Start time: ____________ Finish  time: ____________ Date: ____________ Movements: ____________ Start time: ____________ Finish time: ____________ Date: ____________ Movements: ____________ Start time: ____________ Finish time: ____________ Date: ____________ Movements: ____________ Start time: ____________ Finish time: ____________ Date: ____________ Movements: ____________ Start time: ____________ Finish time: ____________  Date: ____________ Movements: ____________ Start time: ____________ Finish time: ____________ Date: ____________ Movements: ____________ Start time: ____________ Finish time: ____________ Date: ____________ Movements: ____________ Start time: ____________ Finish time: ____________ Date: ____________ Movements: ____________ Start time: ____________ Finish time: ____________ Date: ____________ Movements: ____________ Start time: ____________ Finish time: ____________ Date: ____________ Movements: ____________ Start time: ____________ Finish time: ____________ Document Released: 12/24/2006 Document Revised: 02/16/2012 Document Reviewed: 06/26/2009 [employer] Patient Information 2013 [employer].  

## 2012-12-03 NOTE — Addendum Note (Signed)
Addended by: Loralyn Freshwater on: 12/03/2012 11:07 AM   Modules accepted: Orders

## 2012-12-03 NOTE — Progress Notes (Signed)
35w1dpt  A/P GBS positive Fetal kick counts reviewed Labor reviewed with All patients  questions answered Physical Examination: Skin - Physical Examination: Pelvic - normal external genitalia, vulva, vagina, cervix, uterus and adnexa, WET MOUNT done - results: hyphae.  Copious curd like discharge in the vagina Yeast vaginitis Gynelotrimin

## 2012-12-04 LAB — GC/CHLAMYDIA PROBE AMP: CT Probe RNA: NEGATIVE

## 2012-12-06 ENCOUNTER — Encounter (HOSPITAL_COMMUNITY): Payer: Self-pay | Admitting: *Deleted

## 2012-12-06 ENCOUNTER — Inpatient Hospital Stay (HOSPITAL_COMMUNITY)
Admission: AD | Admit: 2012-12-06 | Discharge: 2012-12-06 | Disposition: A | Discharge status: Home / Self Care | Payer: 59 | Source: Ambulatory Visit | Attending: Obstetrics and Gynecology | Admitting: Obstetrics and Gynecology

## 2012-12-06 DIAGNOSIS — O218 Other vomiting complicating pregnancy: Secondary | ICD-10-CM

## 2012-12-06 DIAGNOSIS — F4541 Pain disorder exclusively related to psychological factors: Secondary | ICD-10-CM

## 2012-12-06 DIAGNOSIS — O26879 Cervical shortening, unspecified trimester: Secondary | ICD-10-CM

## 2012-12-06 DIAGNOSIS — O10919 Unspecified pre-existing hypertension complicating pregnancy, unspecified trimester: Secondary | ICD-10-CM

## 2012-12-06 DIAGNOSIS — O09219 Supervision of pregnancy with history of pre-term labor, unspecified trimester: Secondary | ICD-10-CM

## 2012-12-06 DIAGNOSIS — R0602 Shortness of breath: Secondary | ICD-10-CM | POA: Insufficient documentation

## 2012-12-06 DIAGNOSIS — O99891 Other specified diseases and conditions complicating pregnancy: Secondary | ICD-10-CM | POA: Insufficient documentation

## 2012-12-06 DIAGNOSIS — D649 Anemia, unspecified: Secondary | ICD-10-CM

## 2012-12-06 DIAGNOSIS — Z8659 Personal history of other mental and behavioral disorders: Secondary | ICD-10-CM

## 2012-12-06 DIAGNOSIS — O368190 Decreased fetal movements, unspecified trimester, not applicable or unspecified: Secondary | ICD-10-CM | POA: Insufficient documentation

## 2012-12-06 DIAGNOSIS — R824 Acetonuria: Secondary | ICD-10-CM

## 2012-12-06 DIAGNOSIS — O09299 Supervision of pregnancy with other poor reproductive or obstetric history, unspecified trimester: Secondary | ICD-10-CM

## 2012-12-06 DIAGNOSIS — O212 Late vomiting of pregnancy: Secondary | ICD-10-CM | POA: Insufficient documentation

## 2012-12-06 DIAGNOSIS — S52202A Unspecified fracture of shaft of left ulna, initial encounter for closed fracture: Secondary | ICD-10-CM

## 2012-12-06 DIAGNOSIS — O99820 Streptococcus B carrier state complicating pregnancy: Secondary | ICD-10-CM

## 2012-12-06 DIAGNOSIS — O0930 Supervision of pregnancy with insufficient antenatal care, unspecified trimester: Secondary | ICD-10-CM

## 2012-12-06 DIAGNOSIS — N888 Other specified noninflammatory disorders of cervix uteri: Secondary | ICD-10-CM

## 2012-12-06 DIAGNOSIS — O3440 Maternal care for other abnormalities of cervix, unspecified trimester: Secondary | ICD-10-CM

## 2012-12-06 LAB — URINALYSIS, ROUTINE W REFLEX MICROSCOPIC
Bilirubin Urine: NEGATIVE
Glucose, UA: NEGATIVE mg/dL
Glucose, UA: >1000 mg/dL — AB
Hgb urine dipstick: NEGATIVE
Hgb urine dipstick: NEGATIVE
Ketones, ur: >80 mg/dL — AB
Ketones, ur: 40 mg/dL — AB
Leukocytes, UA: NEGATIVE
Protein, ur: NEGATIVE mg/dL
pH: 5.5 (ref 5.0–8.0)

## 2012-12-06 LAB — KETONES, URINE: Ketones, ur: NEGATIVE mg/dL

## 2012-12-06 LAB — URINE MICROSCOPIC-ADD ON

## 2012-12-06 MED ORDER — ONDANSETRON 8 MG PO TBDP: 8.0000 mg | ORAL_TABLET | Freq: Three times a day (TID) | ORAL | Status: DC | PRN

## 2012-12-06 MED ORDER — DEXTROSE 5 % IN LACTATED RINGERS IV BOLUS
1000.0000 mL | Freq: Once | INTRAVENOUS | Status: AC
  Administered 2012-12-06 (×2): 1000 mL via INTRAVENOUS

## 2012-12-06 MED ORDER — ONDANSETRON 8 MG PO TBDP
8.0000 mg | ORAL_TABLET | Freq: Once | ORAL | Status: AC
  Administered 2012-12-06: 8 mg via ORAL
  Filled 2012-12-06: qty 1

## 2012-12-06 NOTE — Progress Notes (Signed)
History  Patricia Lin is a 31 y.o. [id number] at [redacted]w[redacted]d   Chief Complaint  Patient presents with  . Emesis   Pt reports vomiting x 2 hours, shortness of breath today. Decreased fetal movement today.    States "no appetite, not drinking or eating, do you think I could be dehydrated? unable to pee, no cough, sore throat, no runny or stuffy nose", no energy, no edema, denies uc, srom, or vag bleeding.   [redacted]w[redacted]d   Chief Complaint  Patient presents with  . Emesis   @SFHPI @  Prior to Admission medications   Medication Sig Start Date End Date Taking? Authorizing Provider  butalbital-acetaminophen-caffeine (FIORICET, ESGIC) 50-325-40 MG per tablet Take 1 tablet by mouth 2 (two) times daily as needed.    Historical Provider, MD  clotrimazole (GYNE-LOTRIMIN) 1 % vaginal cream Place 1 Applicatorful vaginally 2 (two) times daily. 12/03/12 12/09/12  Naima A Dillard, MD  metoCLOPramide (REGLAN) 10 MG tablet Take 1 tablet (10 mg total) by mouth every 6 (six) hours as needed (nausea/headache). 09/27/12   Richardean Canal, MD  NIFEdipine (PROCARDIA) 10 MG capsule Take 1 capsule (10 mg total) by mouth every 4 (four) hours as needed (contractions). 10/23/12   Nigel Bridgeman, CNM  Prenatal Vit-Fe Fumarate-FA (PRENATAL MULTIVITAMIN) TABS Take 1 tablet by mouth daily.    Historical Provider, MD    Patient Active Problem List  Diagnosis  . H/O pre-eclampsia in prior pregnancy, currently pregnant  . Chronic hypertension in pregnancy  . Hx LEEP (loop electrosurgical excision procedure), cervix, pregnancy  . Hx of postpartum depression, currently pregnant  . Late prenatal care  . Hx of precipitous labor and deliveries, antepartum  . 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 Anemia  . Cervical funneling  . Cervical shortening  . GBS (group B Streptococcus carrier), +RV culture, currently pregnant  . Fracture of left ulna 09/27/12   Vitals:  Blood pressure 135/77, pulse 101, temperature 98.4 F (36.9 C), temperature source Oral, resp.  rate 18, height 5\' 2"  (1.575 m), weight 180 lb (81.647 kg), last menstrual period 04/01/2012, SpO2 100.00%. OB History    Grav Para Term Preterm Abortions TAB SAB Ect Mult Living   7 3 3  3 1 1   3      Obstetric Comments   2011 PRECLAMPSIA PP; IN ICU X 3 DAYS      Past Medical History  Diagnosis Date  . Hx of breast reduction, elective   . Tachycardia   . Chlamydia infection   . BV (bacterial vaginosis) 2011  . H/O varicella   . Hx: UTI (urinary tract infection)   . Anemia   . H/O vitamin D deficiency   . CIN I (cervical intraepithelial neoplasia I) 2008  . Vulvitis 2009  . Dysuria 2010  . Pregnancy induced hypertension     ALL PREGNANCIES  . Infection     UTI DURING PREGNANCY  . Infection     YEAST WITH PREGNANCY  . Infection     CHLAMYDIA X 1  . Shortness of breath     WITH EXERTION SICNE PREGNANCY  . Headache     MIGRAINES  . Liver disease 2012    UNKOWN DIAGNOSIS  . Tachycardia 2010    HAS HAD WORKUP. UNSURE IF HAS POSSIBLE HEART VALVE ISSUE  . Dysrhythmia 2009    TACHYCARDIA;   HAD W/U 2010? POSSIBLE HEART VALVE ISSUE; NOT F/U NEEDED X 3 YEARS PER PT  . 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 Postpartum depression 2011    NO MEDS  . Low iron     Past Surgical History  Procedure Date  . Dilation and curettage of uterus   . US echocardiography 06/18/2009    ef 55-60%  . Wisdom tooth extraction   . Breast reduction surgery 2008  . Finger surgery     Right index finger    Family History  Problem Relation Age of Onset  . Hypertension Mother   . Hyperlipidemia Mother   . Coronary artery disease Mother   . Heart disease Mother     high cholesterol  . Asthma Mother   . Diabetes Mother   . Hypertension Father   . Hypertension Maternal Grandmother   . Hypertension Maternal Grandfather   . Asthma Daughter     History  Substance Use Topics  . Smoking status: Passive Smoke Exposure - Never Smoker   . Smokeless tobacco: Never Used  . Alcohol Use: Yes     Comment: WEEKENDS -LIQUOR;  LAST DRANK 04/03/12    Allergies:  Allergies  Allergen Reactions  . Latex Itching and Rash    Prescriptions prior to admission  Medication Sig Dispense Refill  . butalbital-acetaminophen-caffeine (FIORICET, ESGIC) 50-325-40 MG per tablet Take 1 tablet by mouth 2 (two) times daily as needed.      . clotrimazole (GYNE-LOTRIMIN) 1 % vaginal cream Place 1 Applicatorful vaginally 2 (two) times daily.  45 g  0  . metoCLOPramide (REGLAN) 10 MG tablet Take 1 tablet (10 mg total) by mouth every 6 (six) hours as needed (nausea/headache).  10 tablet  0  . NIFEdipine (PROCARDIA) 10 MG capsule Take 1 capsule (10 mg total) by mouth every 4 (four) hours as needed (contractions).  36 capsule  2  . Prenatal Vit-Fe Fumarate-FA (PRENATAL MULTIVITAMIN) TABS Take 1 tablet by mouth daily.        @ROS @ Physical Exam   Blood pressure 135/77, pulse 101, temperature 98.4 F (36.9 C), temperature source Oral, resp. rate 18, height 5\' 2"  (1.575 m), weight 180 lb (81.647 kg), last menstrual period 04/01/2012, SpO2 100.00%.   Labs:  No results found for this or any previous visit (from the past 24 hour(s)). ASSESSMENT: Patient Active Problem List  Diagnosis  . H/O pre-eclampsia in prior pregnancy, currently pregnant  . Chronic hypertension in pregnancy  . Hx LEEP (loop electrosurgical excision procedure), cervix, pregnancy  . Hx of postpartum depression, currently pregnant  . Late prenatal care  . Hx of precipitous labor and deliveries, antepartum  . 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 Anemia  . Cervical funneling  . Cervical shortening  . GBS (group B Streptococcus carrier), +RV culture, currently pregnant  . Fracture of left ulna 09/27/12   Physical Examination:  Physical exam: Calm, pleasant, talking an smiling, no distress, HEENT grossly wnl lungs clear bilaterally, AP RRR, abd soft, gravid, nt, bowel sounds active, abdomen  nontender Fundal height 35 FHTS UC few ED Course  Assessment/Plan [redacted]w[redacted]d V x 2 today UA pending, zofran, encouraged clear liquids. [name], CNM 80 ketones, RN reports nausea resolving, NST reactive, IV hydration, will reassess ketones. [name], CNM Denies N,V has been asleep, encouraged po calories, ketones still 40. [name], CNM Ketones clear has pherngan at home zofran rx sent to pharmacy. [name], CNM

## 2012-12-06 NOTE — MAU Note (Signed)
Pt reports vomiting x 2 hours, shortness of breath today. Decreased fetal movement today.

## 2012-12-08 NOTE — L&D Delivery Note (Signed)
Delivery Note At 2:53 AM a viable female, "Patricia Lin", was delivered via Vaginal, Spontaneous Delivery (Presentation: ;  ).  APGAR: , ; weight .   Placenta status: spontaneous, intact , .  Cord:  with the following complications: CAN x 1, reduced over vtx at delivery.  Cord pH: NA  Anesthesia: None  Episiotomy: None Lacerations:  None Suture Repair: None Est. Blood Loss (mL): 200 cc  Mom to postpartum.  Baby to skin to skin.. Patient desires pp BTL ASAP--will maintain IV and NPO status.  Dr. Normand Sloop will coordinate BTL plan with OR. Patient also desires inpatient circumcision--Dr. Normand Sloop will f/u on insurance coverage for procedure.  Patricia Lin 01/09/2013, 3:13 AM

## 2012-12-10 ENCOUNTER — Telehealth: Payer: Self-pay | Admitting: Obstetrics and Gynecology

## 2012-12-10 NOTE — Telephone Encounter (Signed)
Advised pt to drink clear liquids, avoid dairy products. Also advised to follow BRAT diet. Advised pt that if problem last for more than 2-3 days Immodium may be suggested but would need apt.   Contact office or go to MAU if sx worse, or fever begins  Pt voiced understanding.  Darien Ramus, CMA

## 2012-12-17 ENCOUNTER — Encounter: Payer: Self-pay | Admitting: Obstetrics and Gynecology

## 2012-12-17 ENCOUNTER — Ambulatory Visit (INDEPENDENT_AMBULATORY_CARE_PROVIDER_SITE_OTHER): Payer: 59 | Admitting: Obstetrics and Gynecology

## 2012-12-17 VITALS — BP 114/78 | Wt 179.0 lb

## 2012-12-17 DIAGNOSIS — Z331 Pregnant state, incidental: Secondary | ICD-10-CM

## 2012-12-17 NOTE — Progress Notes (Signed)
[redacted]w[redacted]d GFM Stronger irregular contractions.

## 2012-12-17 NOTE — Progress Notes (Signed)
[redacted]w[redacted]d Pt has no complaints Pt wants cervix checked

## 2012-12-20 ENCOUNTER — Telehealth: Payer: Self-pay | Admitting: Obstetrics and Gynecology

## 2012-12-20 ENCOUNTER — Encounter (HOSPITAL_COMMUNITY): Payer: Self-pay | Admitting: *Deleted

## 2012-12-20 ENCOUNTER — Other Ambulatory Visit: Payer: Self-pay | Admitting: Obstetrics and Gynecology

## 2012-12-20 ENCOUNTER — Inpatient Hospital Stay (HOSPITAL_COMMUNITY)
Admission: AD | Admit: 2012-12-20 | Discharge: 2012-12-20 | Disposition: A | Discharge status: Home / Self Care | Payer: 59 | Source: Ambulatory Visit | Attending: Obstetrics and Gynecology | Admitting: Obstetrics and Gynecology

## 2012-12-20 DIAGNOSIS — O139 Gestational [pregnancy-induced] hypertension without significant proteinuria, unspecified trimester: Secondary | ICD-10-CM | POA: Insufficient documentation

## 2012-12-20 DIAGNOSIS — O479 False labor, unspecified: Secondary | ICD-10-CM | POA: Insufficient documentation

## 2012-12-20 LAB — URINALYSIS, ROUTINE W REFLEX MICROSCOPIC
Glucose, UA: NEGATIVE mg/dL
Hgb urine dipstick: NEGATIVE
Specific Gravity, Urine: >1.03 — ABNORMAL HIGH (ref 1.005–1.030)
Urobilinogen, UA: 0.2 mg/dL (ref 0.0–1.0)

## 2012-12-20 LAB — CBC WITH DIFFERENTIAL/PLATELET
Basophils Absolute: 0 10*3/uL (ref 0.0–0.1)
Basophils Relative: 0 % (ref 0–1)
Eosinophils Absolute: 0 10*3/uL (ref 0.0–0.7)
Hemoglobin: 8 g/dL — ABNORMAL LOW (ref 12.0–15.0)
Lymphocytes Relative: 31 % (ref 12–46)
MCHC: 30 g/dL (ref 30.0–36.0)
Neutrophils Relative %: 60 % (ref 43–77)
RDW: 17.3 % — ABNORMAL HIGH (ref 11.5–15.5)
WBC: 4.5 10*3/uL (ref 4.0–10.5)

## 2012-12-20 LAB — COMPREHENSIVE METABOLIC PANEL
ALT: 11 U/L (ref 0–35)
AST: 18 U/L (ref 0–37)
Albumin: 2.9 g/dL — ABNORMAL LOW (ref 3.5–5.2)
Alkaline Phosphatase: 115 U/L (ref 39–117)
Potassium: 3.2 mEq/L — ABNORMAL LOW (ref 3.5–5.1)
Sodium: 136 mEq/L (ref 135–145)
Total Protein: 6.7 g/dL (ref 6.0–8.3)

## 2012-12-20 NOTE — Treatment Plan (Signed)
Labs reviewed with S. Lillard CNM.  Will come to evaluate

## 2012-12-20 NOTE — MAU Note (Signed)
Pt sent over for PIH eval.  Reports headache earlier, has gone away.  Denies any lightheadedness or blurred vision.  Reports increased swelling in feet.  Tingling in hands.  Hx of pre-eclampsia, treated x2 with magnesium.  Denies any bleeding.  Irregular ucs.  + FM.

## 2012-12-20 NOTE — Telephone Encounter (Signed)
Spoke to pt who called stating that her B/P was high this am @ 153/100. Then she went to see an Ortho Dr. For her wrist and it was 145/98. She has a H/A and feels nausea and slightly gassy feeling. Reports good FM . Denies dizziness visual disturbances. Per [facility] on call @ [facility], pt needs to present to MAU for eval. Pt  Voices understanding. Melody Comas A

## 2012-12-20 NOTE — Progress Notes (Addendum)
History  Patricia Lin is a 32 y.o. [id number] at [redacted]w[redacted]d   Chief Complaint  Patient presents with  . Hypertension   Pt sent over for PIH eval. Reports headache earlier, has gone away. Denies any lightheadedness or blurred vision. Reports increased swelling in feet. Tingling in hands. Hx of pre-eclampsia, treated x2 with magnesium. Denies any bleeding. Irregular ucs. + FM.     Denies srom or vag bleeding, with +FM, uc all the time no stronger than usual, with headaches every day. Took percocet today for headache.    [redacted]w[redacted]d   Chief Complaint  Patient presents with  . Hypertension   @SFHPI @  Prior to Admission medications   Medication Sig Start Date End Date Taking? Authorizing Provider  butalbital-acetaminophen-caffeine (FIORICET, ESGIC) 50-325-40 MG per tablet Take 1 tablet by mouth 2 (two) times daily as needed.    Historical Provider, MD  metoCLOPramide (REGLAN) 10 MG tablet Take 1 tablet (10 mg total) by mouth every 6 (six) hours as needed (nausea/headache). 09/27/12   Richardean Canal, MD  NIFEdipine (PROCARDIA) 10 MG capsule Take 1 capsule (10 mg total) by mouth every 4 (four) hours as needed (contractions). 10/23/12   Nigel Bridgeman, CNM  ondansetron (ZOFRAN ODT) 8 MG disintegrating tablet Take 1 tablet (8 mg total) by mouth every 8 (eight) hours as needed for nausea. 12/06/12   [name], CNM  OVER THE COUNTER MEDICATION Apply topically.    Historical Provider, MD  Prenatal Vit-Fe Fumarate-FA (PRENATAL MULTIVITAMIN) TABS Take 1 tablet by mouth daily.    Historical Provider, MD    Patient Active Problem List  Diagnosis  . H/O pre-eclampsia in prior pregnancy, currently pregnant  . Chronic hypertension in pregnancy  . Hx LEEP (loop electrosurgical excision procedure), cervix, pregnancy  . Hx of postpartum depression, currently pregnant  . Late prenatal care  . Hx of precipitous labor and deliveries, antepartum  . 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 Anemia  . Cervical funneling  . Cervical  shortening  . GBS (group B Streptococcus carrier), +RV culture, currently pregnant  . Fracture of left ulna 09/27/12   Vitals:  Blood pressure 120/88, pulse 95, temperature 96.7 F (35.9 C), temperature source Oral, resp. rate 18, last menstrual period 04/01/2012. OB History    Grav Para Term Preterm Abortions TAB SAB Ect Mult Living   7 3 3  3 1 1   3      Obstetric Comments   2011 PRECLAMPSIA PP; IN ICU X 3 DAYS      Past Medical History  Diagnosis Date  . Hx of breast reduction, elective   . Tachycardia   . Chlamydia infection   . BV (bacterial vaginosis) 2011  . H/O varicella   . Hx: UTI (urinary tract infection)   . Anemia   . H/O vitamin D deficiency   . CIN I (cervical intraepithelial neoplasia I) 2008  . Vulvitis 2009  . Dysuria 2010  . Pregnancy induced hypertension     ALL PREGNANCIES  . Infection     UTI DURING PREGNANCY  . Infection     YEAST WITH PREGNANCY  . Infection     CHLAMYDIA X 1  . Shortness of breath     WITH EXERTION SICNE PREGNANCY  . Headache     MIGRAINES  . Liver disease 2012    UNKOWN DIAGNOSIS  . Tachycardia 2010    HAS HAD WORKUP. UNSURE IF HAS POSSIBLE HEART VALVE ISSUE  . Dysrhythmia 2009    TACHYCARDIA;  HAD W/U 2010? POSSIBLE HEART VALVE ISSUE; NOT F/U NEEDED X 3 YEARS PER PT  . 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 Postpartum depression 2011    NO MEDS  . Low iron     Past Surgical History  Procedure Date  . Dilation and curettage of uterus   . US echocardiography 06/18/2009    ef 55-60%  . Wisdom tooth extraction   . Breast reduction surgery 2008  . Finger surgery     Right index finger    Family History  Problem Relation Age of Onset  . Hypertension Mother   . Hyperlipidemia Mother   . Coronary artery disease Mother   . Heart disease Mother     high cholesterol  . Asthma Mother   . Diabetes Mother   . Hypertension Father   . Hypertension Maternal  Grandmother   . Hypertension Maternal Grandfather   . Asthma Daughter     History  Substance Use Topics  . Smoking status: Passive Smoke Exposure - Never Smoker  . Smokeless tobacco: Never Used  . Alcohol Use: Yes     Comment: WEEKENDS -LIQUOR;  LAST DRANK 04/03/12    Allergies:  Allergies  Allergen Reactions  . Latex Itching and Rash    Prescriptions prior to admission  Medication Sig Dispense Refill  . butalbital-acetaminophen-caffeine (FIORICET, ESGIC) 50-325-40 MG per tablet Take 1 tablet by mouth 2 (two) times daily as needed.      . metoCLOPramide (REGLAN) 10 MG tablet Take 1 tablet (10 mg total) by mouth every 6 (six) hours as needed (nausea/headache).  10 tablet  0  . NIFEdipine (PROCARDIA) 10 MG capsule Take 1 capsule (10 mg total) by mouth every 4 (four) hours as needed (contractions).  36 capsule  2  . ondansetron (ZOFRAN ODT) 8 MG disintegrating tablet Take 1 tablet (8 mg total) by mouth every 8 (eight) hours as needed for nausea.  20 tablet  0  . OVER THE COUNTER MEDICATION Apply topically.      . Prenatal Vit-Fe Fumarate-FA (PRENATAL MULTIVITAMIN) TABS Take 1 tablet by mouth daily.        @ROS @ Physical Exam   Blood pressure 120/88, pulse 95, temperature 96.7 F (35.9 C), temperature source Oral, resp. rate 18, last menstrual period 04/01/2012.  @PHYSEXAMBYAGE2 @ Labs:  Recent Results (from the past 24 hour(s))  CBC WITH DIFFERENTIAL   Collection Time   12/20/12  6:02 PM      Component Value Range   WBC 4.5  4.0 - 10.5 K/uL   RBC 3.93  3.87 - 5.11 MIL/uL   Hemoglobin 8.0 (*) 12.0 - 15.0 g/dL   HCT 40.9 (*) 81.1 - 91.4 %   MCV 67.9 (*) 78.0 - 100.0 fL   MCH 20.4 (*) 26.0 - 34.0 pg   MCHC 30.0  30.0 - 36.0 g/dL   RDW 78.2 (*) 95.6 - 21.3 %   Platelets 156  150 - 400 K/uL   Neutrophils Relative PENDING  43 - 77 %   Neutro Abs PENDING  1.7 - 7.7 K/uL   Band Neutrophils PENDING  0 - 10 %   Lymphocytes Relative PENDING  12 - 46 %   Lymphs Abs PENDING  0.7 -  4.0 K/uL   Monocytes Relative PENDING  3 - 12 %   Monocytes Absolute PENDING  0.1 - 1.0 K/uL   Eosinophils Relative PENDING  0 - 5 %   Eosinophils  Absolute PENDING  0.0 - 0.7 K/uL   Basophils Relative PENDING  0 - 1 %   Basophils Absolute PENDING  0.0 - 0.1 K/uL   WBC Morphology PENDING     RBC Morphology PENDING     Smear Review PENDING     nRBC PENDING  0 /100 WBC   Metamyelocytes Relative PENDING     Myelocytes PENDING     Promyelocytes Absolute PENDING     Blasts PENDING     ASSESSMENT: Patient Active Problem List  Diagnosis  . H/O pre-eclampsia in prior pregnancy, currently pregnant  . Chronic hypertension in pregnancy  . Hx LEEP (loop electrosurgical excision procedure), cervix, pregnancy  . Hx of postpartum depression, currently pregnant  . Late prenatal care  . Hx of precipitous labor and deliveries, antepartum  . 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 Anemia  . Cervical funneling  . Cervical shortening  . GBS (group B Streptococcus carrier), +RV culture, currently pregnant  . Fracture of left ulna 09/27/12   Physical Examination:  Physical exam: Calm, no distress, HEENT wnl lungs clear bilaterally, AP RRR, abd soft, gravid, nt, bowel sounds active, abdomen nontender Fundal height 37 vag deferred DTR + 1 no clonus No edema to lower extremities FHTS category 1 UC irregular mild ED Course  Assessment/Plan [redacted]w[redacted]d Hx PIH and HTN with pg with Magnesium PP, PIH labs pending. Serial bps. [name], CNM   Addendum: at 2030  Pt denies any c/o, occ ctx.   Filed Vitals:   12/20/12 1930 12/20/12 1931 12/20/12 1933 12/20/12 2024  BP: 119/76  126/87 140/87  Pulse:  82 85 80  Temp:      TempSrc:      Resp:        Results for orders placed during the hospital encounter of 12/20/12 (from the past 24 hour(s))  URINALYSIS, ROUTINE W REFLEX MICROSCOPIC     Status: Abnormal   Collection Time   12/20/12  5:55 PM      Component Value Range   Color, Urine YELLOW  YELLOW    APPearance CLEAR  CLEAR   Specific Gravity, Urine >1.030 (*) 1.005 - 1.030   pH 6.0  5.0 - 8.0   Glucose, UA NEGATIVE  NEGATIVE mg/dL   Hgb urine dipstick NEGATIVE  NEGATIVE   Bilirubin Urine NEGATIVE  NEGATIVE   Ketones, ur >80 (*) NEGATIVE mg/dL   Protein, ur NEGATIVE  NEGATIVE mg/dL   Urobilinogen, UA 0.2  0.0 - 1.0 mg/dL   Nitrite NEGATIVE  NEGATIVE   Leukocytes, UA NEGATIVE  NEGATIVE  CBC WITH DIFFERENTIAL     Status: Abnormal   Collection Time   12/20/12  6:02 PM      Component Value Range   WBC 4.5  4.0 - 10.5 K/uL   RBC 3.93  3.87 - 5.11 MIL/uL   Hemoglobin 8.0 (*) 12.0 - 15.0 g/dL   HCT 21.3 (*) 08.6 - 57.8 %   MCV 67.9 (*) 78.0 - 100.0 fL   MCH 20.4 (*) 26.0 - 34.0 pg   MCHC 30.0  30.0 - 36.0 g/dL   RDW 46.9 (*) 62.9 - 52.8 %   Platelets 156  150 - 400 K/uL   Neutrophils Relative 60  43 - 77 %   Lymphocytes Relative 31  12 - 46 %   Monocytes Relative 9  3 - 12 %   Eosinophils Relative 0  0 - 5 %   Basophils Relative 0  0 - 1 %  Neutro Abs 2.7  1.7 - 7.7 K/uL   Lymphs Abs 1.4  0.7 - 4.0 K/uL   Monocytes Absolute 0.4  0.1 - 1.0 K/uL   Eosinophils Absolute 0.0  0.0 - 0.7 K/uL   Basophils Absolute 0.0  0.0 - 0.1 K/uL   Smear Review MORPHOLOGY UNREMARKABLE    COMPREHENSIVE METABOLIC PANEL     Status: Abnormal   Collection Time   12/20/12  6:02 PM      Component Value Range   Sodium 136  135 - 145 mEq/L   Potassium 3.2 (*) 3.5 - 5.1 mEq/L   Chloride 101  96 - 112 mEq/L   CO2 22  19 - 32 mEq/L   Glucose, Bld 94  70 - 99 mg/dL   BUN 10  6 - 23 mg/dL   Creatinine, Ser 1.61  0.50 - 1.10 mg/dL   Calcium 9.3  8.4 - 09.6 mg/dL   Total Protein 6.7  6.0 - 8.3 g/dL   Albumin 2.9 (*) 3.5 - 5.2 g/dL   AST 18  0 - 37 U/L   ALT 11  0 - 35 U/L   Alkaline Phosphatase 115  39 - 117 U/L   Total Bilirubin 0.7  0.3 - 1.2 mg/dL   GFR calc non Af Amer >90  >90 mL/min   GFR calc Af Amer >90  >90 mL/min  LACTATE DEHYDROGENASE     Status: Normal   Collection Time   12/20/12  6:02 PM       Component Value Range   LDH 157  94 - 250 U/L  URIC ACID     Status: Normal   Collection Time   12/20/12  6:02 PM      Component Value Range   Uric Acid, Serum 3.6  2.4 - 7.0 mg/dL    No evidence of pre-eclampsia BP stable Labs stable Mild hypokalemia  Anemia   Dc home w instructions on 24 hour urine collection to return to office on Thursday AM rv'd pre-eclampsia/hypertension FKC and labor sx's  Pt requests 1 day of FMLA as she is not able to do urine collection at work.   Pt will RTO Thursday AM for BP check and turn in urine. rv'd w Dr Orvan July, CNM

## 2012-12-21 ENCOUNTER — Telehealth: Payer: Self-pay | Admitting: Obstetrics and Gynecology

## 2012-12-21 NOTE — Telephone Encounter (Signed)
Called pt to schedule apt with SR for Thursday per SL  LVM for return call  Darien Ramus, CMA

## 2012-12-21 NOTE — Telephone Encounter (Signed)
2nd attempt to contact pt. LVM for return call  Ryot Burrous, CMA  

## 2012-12-21 NOTE — Telephone Encounter (Signed)
See note

## 2012-12-22 ENCOUNTER — Telehealth: Payer: Self-pay | Admitting: Obstetrics and Gynecology

## 2012-12-23 ENCOUNTER — Other Ambulatory Visit: Payer: 59

## 2012-12-23 DIAGNOSIS — O139 Gestational [pregnancy-induced] hypertension without significant proteinuria, unspecified trimester: Secondary | ICD-10-CM

## 2012-12-23 LAB — CREATININE, URINE, 24 HOUR
Creatinine, 24H Ur: 1628 mg/d (ref 700–1800)
Creatinine, Urine: 135.7 mg/dL

## 2012-12-23 LAB — PROTEIN, URINE, 24 HOUR: Protein, 24H Urine: 156 mg/d — ABNORMAL HIGH (ref 50–100)

## 2012-12-23 NOTE — Telephone Encounter (Signed)
Pt has scheduled apt on Friday 12/24/12.

## 2012-12-24 ENCOUNTER — Ambulatory Visit: Payer: 59 | Admitting: Obstetrics and Gynecology

## 2012-12-24 VITALS — BP 112/76 | Wt 182.0 lb

## 2012-12-24 DIAGNOSIS — Z331 Pregnant state, incidental: Secondary | ICD-10-CM

## 2012-12-24 MED ORDER — TERCONAZOLE 80 MG VA SUPP: 80.0000 mg | Freq: Every day | VAGINAL | Status: DC

## 2012-12-24 MED ORDER — POTASSIUM CHLORIDE ER 10 MEQ PO TBCR: 20.0000 meq | EXTENDED_RELEASE_TABLET | Freq: Every day | ORAL | Status: DC

## 2012-12-24 MED ORDER — POLYSACCHARIDE IRON COMPLEX 150 MG PO CAPS: 150.0000 mg | ORAL_CAPSULE | Freq: Two times a day (BID) | ORAL | Status: DC

## 2012-12-24 NOTE — Progress Notes (Signed)
Pt stated was having regular contractions for about a hour yesterday. Pt wants a cervix check today. Pt cant void at this time .

## 2012-12-24 NOTE — Progress Notes (Signed)
[redacted]w[redacted]d Pt uncomfortable and tired. Pelvic pressure and irreg. Contractions  Pt c/o yeast sx's after visit, RX terazol  Discussed induction at 40 weeks  Pt requesting to be OOW until delivery, no medical indication at this time rv'd hgb 8.0, and K+ 3.2 - rx iron and K+ sent, rv'd diet  [place] at [facility] for growth, secondary to 4lb total wgt gain  Pt stated she has not had an appetite, only eats "maybe once a day" - discussed sm freq meals vtx  Ballotable 24 hour urine protein =156mg , will CTO for s/s pre-eclampsia  rv'd labor and FKC RTO 1wk

## 2012-12-28 ENCOUNTER — Ambulatory Visit: Payer: 59

## 2012-12-28 ENCOUNTER — Encounter: Payer: Self-pay | Admitting: Obstetrics and Gynecology

## 2012-12-28 ENCOUNTER — Ambulatory Visit: Payer: 59 | Admitting: Obstetrics and Gynecology

## 2012-12-28 VITALS — BP 124/68 | Wt 185.0 lb

## 2012-12-28 DIAGNOSIS — Z3490 Encounter for supervision of normal pregnancy, unspecified, unspecified trimester: Secondary | ICD-10-CM

## 2012-12-28 DIAGNOSIS — Z331 Pregnant state, incidental: Secondary | ICD-10-CM

## 2012-12-28 LAB — US OB FOLLOW UP

## 2012-12-28 NOTE — Progress Notes (Signed)
[redacted]w[redacted]d Declines Cervix Check U/S Comments: Singleton Pregnancy, vertex presentation, anterior placenta, AFI is normal (55th%), EFW = 7 lb 4 oz (41.8%)

## 2012-12-29 NOTE — Progress Notes (Signed)
Wants induction--issue reviewed, cervix not particularly favorable, no indication for medical induction. [place] today, WNL. Patient may discuss request for induction at [facility], with recheck of cervix. Will stop work now--note given.

## 2013-01-04 ENCOUNTER — Ambulatory Visit: Payer: 59 | Admitting: Obstetrics and Gynecology

## 2013-01-04 VITALS — BP 110/78 | Wt 179.0 lb

## 2013-01-04 DIAGNOSIS — Z3490 Encounter for supervision of normal pregnancy, unspecified, unspecified trimester: Secondary | ICD-10-CM

## 2013-01-04 NOTE — Progress Notes (Signed)
[redacted]w[redacted]d  Pt denies cervix check Pt is ready for induction.  Pt states she is unable to leave urine sample at this time.

## 2013-01-04 NOTE — Progress Notes (Signed)
[redacted]w[redacted]d Pt "very ready for induction" C/o UCs - when they occur they are q3 min but never for a full hour before they stop Cx: 2 cm , 50%, -2, posterior Induction scheduled for [redacted]w[redacted]d for chronic HTN and precipitous delivery Note to sch induction sent - pt would like to come in Sat (2/1) night for cervical ripening

## 2013-01-05 ENCOUNTER — Telehealth (HOSPITAL_COMMUNITY): Payer: Self-pay | Admitting: *Deleted

## 2013-01-05 ENCOUNTER — Telehealth: Payer: Self-pay | Admitting: Obstetrics and Gynecology

## 2013-01-05 ENCOUNTER — Encounter (HOSPITAL_COMMUNITY): Payer: Self-pay | Admitting: *Deleted

## 2013-01-05 NOTE — Telephone Encounter (Signed)
Induction scheduled for 01/08/13 @ 7:30 with VL/ND. Patricia Lin

## 2013-01-05 NOTE — Telephone Encounter (Signed)
Preadmission screen  

## 2013-01-08 ENCOUNTER — Inpatient Hospital Stay (HOSPITAL_COMMUNITY)
Admission: RE | Admit: 2013-01-08 | Discharge: 2013-01-10 | DRG: 767 | Disposition: A | Discharge status: Home / Self Care | Payer: 59 | Source: Ambulatory Visit | Attending: Obstetrics and Gynecology | Admitting: Obstetrics and Gynecology

## 2013-01-08 ENCOUNTER — Encounter (HOSPITAL_COMMUNITY): Payer: Self-pay

## 2013-01-08 VITALS — BP 119/78 | HR 72 | Temp 98.0°F | Resp 18 | Ht 62.0 in | Wt 179.0 lb

## 2013-01-08 DIAGNOSIS — O09219 Supervision of pregnancy with history of pre-term labor, unspecified trimester: Secondary | ICD-10-CM

## 2013-01-08 DIAGNOSIS — O10919 Unspecified pre-existing hypertension complicating pregnancy, unspecified trimester: Secondary | ICD-10-CM

## 2013-01-08 DIAGNOSIS — Z22330 Carrier of Group B streptococcus: Secondary | ICD-10-CM

## 2013-01-08 DIAGNOSIS — O26879 Cervical shortening, unspecified trimester: Secondary | ICD-10-CM

## 2013-01-08 DIAGNOSIS — Z91040 Latex allergy status: Secondary | ICD-10-CM | POA: Insufficient documentation

## 2013-01-08 DIAGNOSIS — O99892 Other specified diseases and conditions complicating childbirth: Secondary | ICD-10-CM | POA: Diagnosis present

## 2013-01-08 DIAGNOSIS — O0930 Supervision of pregnancy with insufficient antenatal care, unspecified trimester: Secondary | ICD-10-CM

## 2013-01-08 DIAGNOSIS — Z302 Encounter for sterilization: Secondary | ICD-10-CM

## 2013-01-08 DIAGNOSIS — S52202A Unspecified fracture of shaft of left ulna, initial encounter for closed fracture: Secondary | ICD-10-CM

## 2013-01-08 DIAGNOSIS — O9902 Anemia complicating childbirth: Secondary | ICD-10-CM | POA: Diagnosis present

## 2013-01-08 DIAGNOSIS — O99891 Other specified diseases and conditions complicating pregnancy: Secondary | ICD-10-CM

## 2013-01-08 DIAGNOSIS — N888 Other specified noninflammatory disorders of cervix uteri: Secondary | ICD-10-CM

## 2013-01-08 DIAGNOSIS — D649 Anemia, unspecified: Secondary | ICD-10-CM

## 2013-01-08 DIAGNOSIS — O09299 Supervision of pregnancy with other poor reproductive or obstetric history, unspecified trimester: Secondary | ICD-10-CM

## 2013-01-08 DIAGNOSIS — O480 Post-term pregnancy: Principal | ICD-10-CM | POA: Diagnosis present

## 2013-01-08 DIAGNOSIS — O3440 Maternal care for other abnormalities of cervix, unspecified trimester: Secondary | ICD-10-CM

## 2013-01-08 DIAGNOSIS — F4541 Pain disorder exclusively related to psychological factors: Secondary | ICD-10-CM

## 2013-01-08 DIAGNOSIS — O99820 Streptococcus B carrier state complicating pregnancy: Secondary | ICD-10-CM

## 2013-01-08 DIAGNOSIS — O1002 Pre-existing essential hypertension complicating childbirth: Secondary | ICD-10-CM | POA: Diagnosis present

## 2013-01-08 DIAGNOSIS — Z8659 Personal history of other mental and behavioral disorders: Secondary | ICD-10-CM

## 2013-01-08 LAB — COMPREHENSIVE METABOLIC PANEL
ALT: 9 U/L (ref 0–35)
AST: 17 U/L (ref 0–37)
BUN: 12 mg/dL (ref 6–23)
Chloride: 101 mEq/L (ref 96–112)
GFR calc Af Amer: >90 mL/min (ref >90)
GFR calc non Af Amer: >90 mL/min (ref >90)
Potassium: 3.7 mEq/L (ref 3.5–5.1)

## 2013-01-08 LAB — URINALYSIS, ROUTINE W REFLEX MICROSCOPIC
Glucose, UA: NEGATIVE mg/dL
Ketones, ur: 40 mg/dL — AB
Nitrite: NEGATIVE
Protein, ur: NEGATIVE mg/dL

## 2013-01-08 LAB — URIC ACID: Uric Acid, Serum: 4.4 mg/dL (ref 2.4–7.0)

## 2013-01-08 LAB — CBC
HCT: 28.7 % — ABNORMAL LOW (ref 36.0–46.0)
Hemoglobin: 8.9 g/dL — ABNORMAL LOW (ref 12.0–15.0)
RBC: 4.36 MIL/uL (ref 3.87–5.11)
RDW: 18.7 % — ABNORMAL HIGH (ref 11.5–15.5)
WBC: 5.7 10*3/uL (ref 4.0–10.5)

## 2013-01-08 LAB — URINE MICROSCOPIC-ADD ON

## 2013-01-08 MED ORDER — ONDANSETRON HCL 4 MG/2ML IJ SOLN: 4.0000 mg | Freq: Four times a day (QID) | INTRAMUSCULAR | Status: DC | PRN

## 2013-01-08 MED ORDER — FLEET ENEMA 7-19 GM/118ML RE ENEM: 1.0000 | ENEMA | RECTAL | Status: DC | PRN

## 2013-01-08 MED ORDER — LACTATED RINGERS IV SOLN: 500.0000 mL | INTRAVENOUS | Status: DC | PRN

## 2013-01-08 MED ORDER — BUTORPHANOL TARTRATE 1 MG/ML IJ SOLN
1.0000 mg | INTRAMUSCULAR | Status: DC | PRN
  Administered 2013-01-09 (×2): 1 mg via INTRAVENOUS
  Filled 2013-01-08 (×2): qty 1

## 2013-01-08 MED ORDER — ACETAMINOPHEN 325 MG PO TABS: 650.0000 mg | ORAL_TABLET | ORAL | Status: DC | PRN

## 2013-01-08 MED ORDER — LABETALOL HCL 5 MG/ML IV SOLN
10.0000 mg | INTRAVENOUS | Status: DC | PRN
  Filled 2013-01-08 (×2): qty 4

## 2013-01-08 MED ORDER — LACTATED RINGERS IV SOLN
INTRAVENOUS | Status: DC
  Administered 2013-01-08: 21:00:00 via INTRAVENOUS

## 2013-01-08 MED ORDER — CITRIC ACID-SODIUM CITRATE 334-500 MG/5ML PO SOLN: 30.0000 mL | ORAL | Status: DC | PRN

## 2013-01-08 MED ORDER — OXYTOCIN 40 UNITS IN LACTATED RINGERS INFUSION - SIMPLE MED
1.0000 m[IU]/min | INTRAVENOUS | Status: DC
  Administered 2013-01-08: 1 m[IU]/min via INTRAVENOUS

## 2013-01-08 MED ORDER — LIDOCAINE HCL (PF) 1 % IJ SOLN
30.0000 mL | INTRAMUSCULAR | Status: DC | PRN
  Filled 2013-01-08: qty 30

## 2013-01-08 MED ORDER — TERBUTALINE SULFATE 1 MG/ML IJ SOLN: 0.2500 mg | Freq: Once | INTRAMUSCULAR | Status: AC | PRN

## 2013-01-08 MED ORDER — PENICILLIN G POTASSIUM 5000000 UNITS IJ SOLR
2.5000 10*6.[IU] | INTRAVENOUS | Status: DC
  Filled 2013-01-08 (×2): qty 2.5

## 2013-01-08 MED ORDER — IBUPROFEN 600 MG PO TABS
600.0000 mg | ORAL_TABLET | Freq: Four times a day (QID) | ORAL | Status: DC | PRN
  Administered 2013-01-09: 600 mg via ORAL
  Filled 2013-01-08: qty 1

## 2013-01-08 MED ORDER — OXYTOCIN BOLUS FROM INFUSION
500.0000 mL | INTRAVENOUS | Status: DC
  Administered 2013-01-09: 500 mL via INTRAVENOUS

## 2013-01-08 MED ORDER — PENICILLIN G POTASSIUM 5000000 UNITS IJ SOLR
5.0000 10*6.[IU] | Freq: Once | INTRAVENOUS | Status: AC
  Administered 2013-01-08: 5 10*6.[IU] via INTRAVENOUS
  Filled 2013-01-08: qty 5

## 2013-01-08 MED ORDER — OXYCODONE-ACETAMINOPHEN 5-325 MG PO TABS: 1.0000 | ORAL_TABLET | ORAL | Status: DC | PRN

## 2013-01-08 MED ORDER — OXYTOCIN 40 UNITS IN LACTATED RINGERS INFUSION - SIMPLE MED
62.5000 mL/h | INTRAVENOUS | Status: DC
  Filled 2013-01-08: qty 1000

## 2013-01-08 NOTE — Progress Notes (Addendum)
Note regarding GBS status: On admission (and during patient's pregnancy), GBS result on banner indicated GBS as positive.  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 GBS positive was also noted on problem list and in office notes  GBS culture done 06/30/12 was negative, but did not populate into EPIC due to done as GBS culture, not PCR test. However, since the banner indicated GBS positive, no additional testing was done during the pregnancy--but this was not known until initial dose of PCN was infusing. Essentially, GBS status is unknown at this moment, with patient having received single dose of PCN at present. This negative result was manually entered on admission by [facility].   Plan: EPIC results console will be cleared of GBS result. GBS test will be obtained via PCR testing for more rapid result during her labor. If positive, GBS treatment regimen will continue. If negative, no further GBS treatment will be needed. EPIC staff will be notified of this issue. PITT will be corrected to reflect current status of unknown, but pending, GBS result.  Nigel Bridgeman, CNM 01/08/13 9:40pm  Addendum: GBS by PCR negative--will finalize notes and banner with GBS negative result.  Nigel Bridgeman, CNM 01/08/13/11:30pm
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
## 2013-01-08 NOTE — Progress Notes (Signed)
Subjective: Watching TV with partner--aware of contractions, but not uncomfortable.  Denies HA, visual sx, or epigastric pain.  Objective: BP 142/94  Pulse 79  Temp 98.3 F (36.8 C) (Oral)  Resp 18  Ht 5\' 2"  (1.575 m)  Wt 179 lb (81.194 kg)  BMI 32.74 kg/m2  LMP 04/01/2012   Filed Vitals:   01/08/13 2201 01/08/13 2220 01/08/13 2231 01/08/13 2301  BP: 128/89  135/91 142/94  Pulse: 82  76 79  Temp:      TempSrc:      Resp: 18  20 18   Height:  5\' 2"  (1.575 m)    Weight:  179 lb (81.194 kg)     DTR 1+ without clonus, trace edema.  Results for orders placed during the hospital encounter of 01/08/13 (from the past 24 hour(s))  TYPE AND SCREEN     Status: Normal   Collection Time   01/08/13  8:30 PM      Component Value Range   ABO/RH(D) A POS     Antibody Screen NEG     Sample Expiration 01/11/2013    CBC     Status: Abnormal   Collection Time   01/08/13  8:33 PM      Component Value Range   WBC 5.7  4.0 - 10.5 K/uL   RBC 4.36  3.87 - 5.11 MIL/uL   Hemoglobin 8.9 (*) 12.0 - 15.0 g/dL   HCT 47.8 (*) 29.5 - 62.1 %   MCV 65.8 (*) 78.0 - 100.0 fL   MCH 20.4 (*) 26.0 - 34.0 pg   MCHC 31.0  30.0 - 36.0 g/dL   RDW 30.8 (*) 65.7 - 84.6 %   Platelets 203  150 - 400 K/uL  COMPREHENSIVE METABOLIC PANEL     Status: Abnormal   Collection Time   01/08/13  8:33 PM      Component Value Range   Sodium 134 (*) 135 - 145 mEq/L   Potassium 3.7  3.5 - 5.1 mEq/L   Chloride 101  96 - 112 mEq/L   CO2 20  19 - 32 mEq/L   Glucose, Bld 82  70 - 99 mg/dL   BUN 12  6 - 23 mg/dL   Creatinine, Ser 9.62  0.50 - 1.10 mg/dL   Calcium 9.5  8.4 - 95.2 mg/dL   Total Protein 6.8  6.0 - 8.3 g/dL   Albumin 2.9 (*) 3.5 - 5.2 g/dL   AST 17  0 - 37 U/L   ALT 9  0 - 35 U/L   Alkaline Phosphatase 151 (*) 39 - 117 U/L   Total Bilirubin 0.7  0.3 - 1.2 mg/dL   GFR calc non Af Amer >90  >90 mL/min   GFR calc Af Amer >90  >90 mL/min  LACTATE DEHYDROGENASE     Status: Normal   Collection Time   01/08/13  8:33  PM      Component Value Range   LDH 144  94 - 250 U/L  URIC ACID     Status: Normal   Collection Time   01/08/13  8:33 PM      Component Value Range   Uric Acid, Serum 4.4  2.4 - 7.0 mg/dL  URINALYSIS, ROUTINE W REFLEX MICROSCOPIC     Status: Abnormal   Collection Time   01/08/13  9:05 PM      Component Value Range   Color, Urine YELLOW  YELLOW   APPearance CLEAR  CLEAR   Specific Gravity, Urine <1.005 (*)  1.005 - 1.030   pH 6.0  5.0 - 8.0   Glucose, UA NEGATIVE  NEGATIVE mg/dL   Hgb urine dipstick NEGATIVE  NEGATIVE   Bilirubin Urine NEGATIVE  NEGATIVE   Ketones, ur 40 (*) NEGATIVE mg/dL   Protein, ur NEGATIVE  NEGATIVE mg/dL   Urobilinogen, UA 0.2  0.0 - 1.0 mg/dL   Nitrite NEGATIVE  NEGATIVE   Leukocytes, UA MODERATE (*) NEGATIVE  URINE MICROSCOPIC-ADD ON     Status: Normal   Collection Time   01/08/13  9:05 PM      Component Value Range   Squamous Epithelial / LPF RARE  RARE   WBC, UA 3-6  <3 WBC/hpf   RBC / HPF 0-2  <3 RBC/hpf  GROUP B STREP BY PCR     Status: Normal   Collection Time   01/08/13  9:50 PM      Component Value Range   Group B strep by PCR NEGATIVE  NEGATIVE      FHT: Category 1 UC:   regular, every 2-3 minutes Pitocin on 5 mu/min  Assessment / Plan: Induction due to postdates, hx rapid labor Chronic hypertension--no meds during this pregnancy  Plan: Continue pitocin induction Check cervix in 30-45 min or prn. Consulted with Dr. Normand Sloop regarding BP--will defer scheduled meds at present.  Labetalol IV prn systolic > or = 160 or diastolic > or = 105.  Patricia Lin 01/08/2013, 11:44 PM

## 2013-01-09 ENCOUNTER — Encounter (HOSPITAL_COMMUNITY): Payer: Self-pay

## 2013-01-09 ENCOUNTER — Inpatient Hospital Stay (HOSPITAL_COMMUNITY): Payer: 59

## 2013-01-09 ENCOUNTER — Encounter (HOSPITAL_COMMUNITY)
Admission: RE | Disposition: A | Discharge status: Home / Self Care | Payer: Self-pay | Source: Ambulatory Visit | Attending: Obstetrics and Gynecology

## 2013-01-09 HISTORY — PX: TUBAL LIGATION: SHX77

## 2013-01-09 LAB — RPR: RPR Ser Ql: NONREACTIVE

## 2013-01-09 SURGERY — LIGATION, FALLOPIAN TUBE, POSTPARTUM
Anesthesia: Spinal | Site: Abdomen | Laterality: Bilateral | Wound class: Clean Contaminated

## 2013-01-09 MED ORDER — FENTANYL CITRATE 0.05 MG/ML IJ SOLN
INTRAMUSCULAR | Status: DC | PRN
  Administered 2013-01-09 (×2): 50 ug via INTRAVENOUS

## 2013-01-09 MED ORDER — TETANUS-DIPHTH-ACELL PERTUSSIS 5-2.5-18.5 LF-MCG/0.5 IM SUSP: 0.5000 mL | Freq: Once | INTRAMUSCULAR | Status: DC

## 2013-01-09 MED ORDER — HYDROMORPHONE HCL 2 MG PO TABS: 2.0000 mg | ORAL_TABLET | ORAL | Status: DC | PRN

## 2013-01-09 MED ORDER — ONDANSETRON HCL 4 MG PO TABS: 4.0000 mg | ORAL_TABLET | ORAL | Status: DC | PRN

## 2013-01-09 MED ORDER — LACTATED RINGERS IV SOLN: INTRAVENOUS | Status: DC

## 2013-01-09 MED ORDER — LACTATED RINGERS IV SOLN
INTRAVENOUS | Status: DC | PRN
  Administered 2013-01-09 (×2): via INTRAVENOUS

## 2013-01-09 MED ORDER — DIPHENHYDRAMINE HCL 25 MG PO CAPS: 25.0000 mg | ORAL_CAPSULE | Freq: Four times a day (QID) | ORAL | Status: DC | PRN

## 2013-01-09 MED ORDER — BUPIVACAINE-EPINEPHRINE 0.5% -1:200000 IJ SOLN
INTRAMUSCULAR | Status: DC | PRN
  Administered 2013-01-09: 10 mL

## 2013-01-09 MED ORDER — IBUPROFEN 600 MG PO TABS
600.0000 mg | ORAL_TABLET | Freq: Four times a day (QID) | ORAL | Status: DC
  Administered 2013-01-09 – 2013-01-10 (×5): 600 mg via ORAL
  Filled 2013-01-09 (×5): qty 1

## 2013-01-09 MED ORDER — MIDAZOLAM HCL 2 MG/2ML IJ SOLN
INTRAMUSCULAR | Status: AC
  Filled 2013-01-09: qty 2

## 2013-01-09 MED ORDER — MIDAZOLAM HCL 5 MG/5ML IJ SOLN
INTRAMUSCULAR | Status: DC | PRN
  Administered 2013-01-09 (×2): 1 mg via INTRAVENOUS

## 2013-01-09 MED ORDER — PROPOFOL 10 MG/ML IV BOLUS
INTRAVENOUS | Status: DC | PRN
  Administered 2013-01-09 (×4): 10 mg via INTRAVENOUS

## 2013-01-09 MED ORDER — FAMOTIDINE 20 MG PO TABS
40.0000 mg | ORAL_TABLET | Freq: Once | ORAL | Status: AC
  Administered 2013-01-09: 40 mg via ORAL
  Filled 2013-01-09: qty 2

## 2013-01-09 MED ORDER — PROPOFOL 10 MG/ML IV EMUL
INTRAVENOUS | Status: AC
  Filled 2013-01-09: qty 20

## 2013-01-09 MED ORDER — ZOLPIDEM TARTRATE 5 MG PO TABS: 5.0000 mg | ORAL_TABLET | Freq: Every evening | ORAL | Status: DC | PRN

## 2013-01-09 MED ORDER — ONDANSETRON HCL 4 MG/2ML IJ SOLN
INTRAMUSCULAR | Status: DC | PRN
  Administered 2013-01-09: 4 mg via INTRAVENOUS

## 2013-01-09 MED ORDER — FENTANYL CITRATE 0.05 MG/ML IJ SOLN
INTRAMUSCULAR | Status: AC
  Filled 2013-01-09: qty 2

## 2013-01-09 MED ORDER — BUPIVACAINE-EPINEPHRINE (PF) 0.5% -1:200000 IJ SOLN
INTRAMUSCULAR | Status: AC
  Filled 2013-01-09: qty 10

## 2013-01-09 MED ORDER — BENZOCAINE-MENTHOL 20-0.5 % EX AERO: 1.0000 "application " | INHALATION_SPRAY | CUTANEOUS | Status: DC | PRN

## 2013-01-09 MED ORDER — WITCH HAZEL-GLYCERIN EX PADS: 1.0000 "application " | MEDICATED_PAD | CUTANEOUS | Status: DC | PRN

## 2013-01-09 MED ORDER — FENTANYL CITRATE 0.05 MG/ML IJ SOLN: 25.0000 ug | INTRAMUSCULAR | Status: DC | PRN

## 2013-01-09 MED ORDER — LANOLIN HYDROUS EX OINT: TOPICAL_OINTMENT | CUTANEOUS | Status: DC | PRN

## 2013-01-09 MED ORDER — SIMETHICONE 80 MG PO CHEW
80.0000 mg | CHEWABLE_TABLET | ORAL | Status: DC | PRN
  Administered 2013-01-09 – 2013-01-10 (×2): 80 mg via ORAL

## 2013-01-09 MED ORDER — PHENYLEPHRINE 40 MCG/ML (10ML) SYRINGE FOR IV PUSH (FOR BLOOD PRESSURE SUPPORT)
PREFILLED_SYRINGE | INTRAVENOUS | Status: AC
  Filled 2013-01-09: qty 5

## 2013-01-09 MED ORDER — SENNOSIDES-DOCUSATE SODIUM 8.6-50 MG PO TABS
2.0000 | ORAL_TABLET | Freq: Every day | ORAL | Status: DC
  Administered 2013-01-10: 2 via ORAL

## 2013-01-09 MED ORDER — EPHEDRINE 5 MG/ML INJ
INTRAVENOUS | Status: AC
  Filled 2013-01-09: qty 10

## 2013-01-09 MED ORDER — SODIUM CHLORIDE 0.9 % IR SOLN
Status: DC | PRN
  Administered 2013-01-09: 1000 mL

## 2013-01-09 MED ORDER — DIBUCAINE 1 % RE OINT: 1.0000 "application " | TOPICAL_OINTMENT | RECTAL | Status: DC | PRN

## 2013-01-09 MED ORDER — HYDROMORPHONE HCL PF 1 MG/ML IJ SOLN
2.0000 mg | INTRAMUSCULAR | Status: DC | PRN
  Administered 2013-01-09: 2 mg via INTRAVENOUS
  Filled 2013-01-09: qty 2

## 2013-01-09 MED ORDER — OXYCODONE-ACETAMINOPHEN 5-325 MG PO TABS
1.0000 | ORAL_TABLET | ORAL | Status: DC | PRN
  Administered 2013-01-09 (×2): 1 via ORAL
  Administered 2013-01-10 (×3): 2 via ORAL
  Filled 2013-01-09 (×2): qty 1
  Filled 2013-01-09 (×2): qty 2
  Filled 2013-01-09: qty 1

## 2013-01-09 MED ORDER — EPHEDRINE SULFATE 50 MG/ML IJ SOLN
INTRAMUSCULAR | Status: DC | PRN
  Administered 2013-01-09 (×3): 10 mg via INTRAVENOUS

## 2013-01-09 MED ORDER — ONDANSETRON HCL 4 MG/2ML IJ SOLN: 4.0000 mg | INTRAMUSCULAR | Status: DC | PRN

## 2013-01-09 MED ORDER — LIDOCAINE HCL (CARDIAC) 20 MG/ML IV SOLN
INTRAVENOUS | Status: AC
  Filled 2013-01-09: qty 5

## 2013-01-09 MED ORDER — PRENATAL MULTIVITAMIN CH
1.0000 | ORAL_TABLET | Freq: Every day | ORAL | Status: DC
  Administered 2013-01-10: 1 via ORAL
  Filled 2013-01-09: qty 1

## 2013-01-09 MED ORDER — PHENYLEPHRINE HCL 10 MG/ML IJ SOLN
INTRAMUSCULAR | Status: DC | PRN
  Administered 2013-01-09: 40 ug via INTRAVENOUS

## 2013-01-09 MED ORDER — MORPHINE SULFATE 0.5 MG/ML IJ SOLN
INTRAMUSCULAR | Status: AC
  Filled 2013-01-09: qty 10

## 2013-01-09 MED ORDER — ONDANSETRON HCL 4 MG/2ML IJ SOLN
INTRAMUSCULAR | Status: AC
  Filled 2013-01-09: qty 2

## 2013-01-09 MED ORDER — METOCLOPRAMIDE HCL 10 MG PO TABS
10.0000 mg | ORAL_TABLET | Freq: Once | ORAL | Status: AC
  Administered 2013-01-09: 10 mg via ORAL
  Filled 2013-01-09: qty 1

## 2013-01-09 SURGICAL SUPPLY — 29 items
CHLORAPREP W/TINT 26ML (MISCELLANEOUS) ×2 IMPLANT
CLOTH BEACON ORANGE TIMEOUT ST (SAFETY) ×2 IMPLANT
CONTAINER PREFILL 10% NBF 15ML (MISCELLANEOUS) ×4 IMPLANT
DRSG COVADERM PLUS 2X2 (GAUZE/BANDAGES/DRESSINGS) ×1 IMPLANT
ELECT REM PT RETURN 9FT ADLT (ELECTROSURGICAL)
ELECTRODE REM PT RTRN 9FT ADLT (ELECTROSURGICAL) IMPLANT
GLOVE BIO SURGEON STRL SZ 6.5 (GLOVE) ×2 IMPLANT
GLOVE BIOGEL PI IND STRL 7.0 (GLOVE) ×3 IMPLANT
GLOVE BIOGEL PI INDICATOR 7.0 (GLOVE) ×3
GLOVE SKINSENSE NS SZ6.5 (GLOVE) ×3
GLOVE SKINSENSE STRL SZ6.5 (GLOVE) IMPLANT
GOWN PREVENTION PLUS LG XLONG (DISPOSABLE) ×4 IMPLANT
NDL HYPO 25X1 1.5 SAFETY (NEEDLE) ×1 IMPLANT
NEEDLE HYPO 25X1 1.5 SAFETY (NEEDLE) ×2 IMPLANT
NS IRRIG 1000ML POUR BTL (IV SOLUTION) ×2 IMPLANT
PACK ABDOMINAL MINOR (CUSTOM PROCEDURE TRAY) ×2 IMPLANT
PENCIL BUTTON HOLSTER BLD 10FT (ELECTRODE) IMPLANT
SPONGE LAP 4X18 X RAY DECT (DISPOSABLE) IMPLANT
STRIP CLOSURE SKIN 1/2X4 (GAUZE/BANDAGES/DRESSINGS) IMPLANT
SUT MNCRL AB 3-0 PS2 27 (SUTURE) ×2 IMPLANT
SUT PLAIN 2 0 (SUTURE)
SUT PLAIN ABS 2-0 CT1 27XMFL (SUTURE) IMPLANT
SUT VIC AB 0 CT1 27 (SUTURE) ×2
SUT VIC AB 0 CT1 27XBRD ANBCTR (SUTURE) ×1 IMPLANT
SYR BULB IRRIGATION 50ML (SYRINGE) IMPLANT
SYR CONTROL 10ML LL (SYRINGE) ×2 IMPLANT
TOWEL OR 17X24 6PK STRL BLUE (TOWEL DISPOSABLE) ×4 IMPLANT
TRAY FOLEY CATH 14FR (SET/KITS/TRAYS/PACK) ×2 IMPLANT
WATER STERILE IRR 1000ML POUR (IV SOLUTION) ×2 IMPLANT

## 2013-01-09 NOTE — Transfer of Care (Signed)
Immediate Anesthesia Transfer of Care Note  Patient: Patricia Lin  Procedure(s) Performed: Procedure(s) (LRB) with comments: POST PARTUM TUBAL LIGATION (Bilateral) - post partum tubal ligation bilateral  Patient Location: PACU  Anesthesia Type:Spinal  Level of Consciousness: awake, alert  and oriented  Airway & Oxygen Therapy: Patient Spontanous Breathing  Post-op Assessment: Report given to PACU RN and Post -op Vital signs reviewed and stable  Post vital signs: Reviewed and stable  Complications: No apparent anesthesia complications

## 2013-01-09 NOTE — Anesthesia Preprocedure Evaluation (Addendum)
Anesthesia Evaluation  Patient identified by MRN, date of birth, ID band Patient awake    Reviewed: Allergy & Precautions, H&P , Patient's Chart, lab work & pertinent test results  Airway Mallampati: II TM Distance: >3 FB Neck ROM: full    Dental No notable dental hx.    Pulmonary  breath sounds clear to auscultation  Pulmonary exam normal       Cardiovascular Exercise Tolerance: Good Rhythm:regular Rate:Normal     Neuro/Psych    GI/Hepatic   Endo/Other    Renal/GU      Musculoskeletal   Abdominal   Peds  Hematology   Anesthesia Other Findings   Reproductive/Obstetrics                           Anesthesia Physical Anesthesia Plan  ASA: II  Anesthesia Plan: Spinal   Post-op Pain Management:    Induction:   Airway Management Planned:   Additional Equipment:   Intra-op Plan:   Post-operative Plan:   Informed Consent: I have reviewed the patients History and Physical, chart, labs and discussed the procedure including the risks, benefits and alternatives for the proposed anesthesia with the patient or authorized representative who has indicated his/her understanding and acceptance.   Dental Advisory Given  Plan Discussed with: CRNA  Anesthesia Plan Comments: (Lab work confirmed with CRNA in room. Platelets okay. Discussed spinal anesthetic, and patient consents to the procedure:  included risk of possible headache,backache, failed block, allergic reaction, and nerve injury. This patient was asked if she had any questions or concerns before the procedure started. )        Anesthesia Quick Evaluation  

## 2013-01-09 NOTE — Anesthesia Postprocedure Evaluation (Signed)
  Anesthesia Post-op Note  Patient: Patricia Lin  Procedure(s) Performed: Procedure(s) (LRB) with comments: POST PARTUM TUBAL LIGATION (Bilateral) - post partum tubal ligation bilateral  Patient Location: Mother/Baby  Anesthesia Type:Spinal  Level of Consciousness: awake, alert  and oriented  Airway and Oxygen Therapy: Patient Spontanous Breathing  Post-op Pain: mild  Post-op Assessment: Patient's Cardiovascular Status Stable, Respiratory Function Stable, Patent Airway, No signs of Nausea or vomiting and Pain level controlled  Post-op Vital Signs: Reviewed and stable  Complications: No apparent anesthesia complications

## 2013-01-09 NOTE — Addendum Note (Signed)
Addendum  created 01/09/13 1513 by Lincoln Brigham, CRNA   Modules edited:Notes Section

## 2013-01-09 NOTE — Op Note (Signed)
reop diagnosis: Multiparity desires sterilization  Postop diagnosis: Same  Procedure: Postpartum tubal ligation Surgeon: Symia Herdt Anesthesia:Epidural Estimated blood loss: Minimal Complications: None  Pathology: Bilateral portion of tubes Condition and transferred to PACU: stable   Before the tubal ligation was done, the patient was told that the risks are but not limited to bleeding infection damage to internal organs such as bowel bladder major blood vessels. The patient understands that the failure rate is 1 in 200. She understands a half of those failures can result in ectopic or tubal pregnancy. The patient was also well aware of all birth control that was available to her. The patient was taken to the operating room. Once her epidural was found to be adequate, a timeout was done. The patient's consent was signed.  10 cc of local was she used to infiltrate around the umbilicus to obtain adequate anesthesia. This amounts was used because the patient felt prickly touch with the Allis clamp. Once all the anesthesia was found to be adequate, I began the procedure. A 10 mm infraumbilical incision was made along her previous incision. The fascia was then grasped with 2 cokers after the subcutaneous tissue had been bluntly and sharply dissected. The fascia was then incised and extended bilaterally. The peritoneum was entered bluntly. The patient's right fallopian tube was grasped with Babcock clamp. Follow out to the fimbriated end. And a half a centimeter of the mid isthmic portion of the tube was ligated with 2-0 plain and excised. The patient's left fallopian tube was grasped with Babcock clamp followed out to the fimbriated end. About a centimeter of the mid isthmic portion of the tube was ligated with 2-0 plain and excised. Both tubes were returned to the abdomen. Hemostasis was noted. The peritoneum was closed with 0 Vicryl. The fascia was also closed using 0 Vicryl. The skin was closed with 3-0  Monocryl in subcuticular fashion. Sponge lap and needle counts were correct. Patient went to recovery in stable condition. 

## 2013-01-09 NOTE — Progress Notes (Signed)
  Subjective: Breathing with contractions, some pressure.  Mother and aunt at [facility] with FOB.  Received 1 dose IV meds at 1:06am, requests repeat dose.  Declined Labetalol at 2:31am, requested repeat BP, which was 153/88.  Objective: BP 178/93  Pulse 78  Temp 98.3 F (36.8 C) (Oral)  Resp 16  Ht 5\' 2"  (1.575 m)  Wt 179 lb (81.194 kg)  BMI 32.74 kg/m2  LMP 04/01/2012   Filed Vitals:   01/09/13 0105 01/09/13 0131 01/09/13 0201 01/09/13 0231  BP: 152/93 143/83 141/80 178/93  Pulse: 75 91 86 78  Temp:      TempSrc:      Resp: 18 16 16    Height:      Weight:           FHT:  Mild variables with UCs UC:   regular, every 2-3 minutes SVE:   Dilation: 7 Effacement (%): 80 Station: -2;-1 Exam by:: Manfred Arch, CNM Cervix more anterior.  Assessment / Plan: Progressive labor, now transitional. Will CTO--await urge to push.  Nigel Bridgeman 01/09/2013, 2:38 AM

## 2013-01-09 NOTE — Progress Notes (Signed)
  Subjective: Doing well--some UCs more painful than others, but no need for any pain medication at present.  Objective: BP 135/89  Pulse 82  Temp 98.3 F (36.8 C) (Oral)  Resp 18  Ht 5\' 2"  (1.575 m)  Wt 179 lb (81.194 kg)  BMI 32.74 kg/m2  LMP 04/01/2012      FHT:  Category 1 UC:   regular, every 2-3 minutes SVE:   Dilation: 3 Effacement (%): 80 Station: -2 Exam by:: Manfred Arch, CNM BBOW--AROM, clear fluid, moderate amount  Assessment / Plan: Progressive labor Will CTO.  Nigel Bridgeman 01/09/2013, 12:36 AM

## 2013-01-09 NOTE — Anesthesia Procedure Notes (Signed)

## 2013-01-09 NOTE — Progress Notes (Signed)
Patient to OR

## 2013-01-09 NOTE — Anesthesia Postprocedure Evaluation (Signed)
  Anesthesia Post-op Note  Patient: Patricia Lin  Procedure(s) Performed: Procedure(s) (LRB) with comments: POST PARTUM TUBAL LIGATION (Bilateral) - post partum tubal ligation bilateral Patient is awake, responsive, moving her legs, and has signs of resolution of her numbness. Pain and nausea are reasonably well controlled. Vital signs are stable and clinically acceptable. Oxygen saturation is clinically acceptable. There are no apparent anesthetic complications at this time. Patient is ready for discharge.

## 2013-01-10 ENCOUNTER — Encounter (HOSPITAL_COMMUNITY): Payer: Self-pay | Admitting: Obstetrics and Gynecology

## 2013-01-10 LAB — CBC
MCH: 20.1 pg — ABNORMAL LOW (ref 26.0–34.0)
MCHC: 29.6 g/dL — ABNORMAL LOW (ref 30.0–36.0)
MCV: 67.9 fL — ABNORMAL LOW (ref 78.0–100.0)
Platelets: 160 10*3/uL (ref 150–400)
RBC: 3.68 MIL/uL — ABNORMAL LOW (ref 3.87–5.11)
RDW: 18 % — ABNORMAL HIGH (ref 11.5–15.5)

## 2013-01-10 MED ORDER — OXYCODONE-ACETAMINOPHEN 5-325 MG PO TABS: 1.0000 | ORAL_TABLET | ORAL | Status: DC | PRN

## 2013-01-10 MED ORDER — IBUPROFEN 600 MG PO TABS: 600.0000 mg | ORAL_TABLET | Freq: Four times a day (QID) | ORAL | Status: DC

## 2013-01-10 NOTE — Discharge Summary (Signed)
  Obstetric Discharge Summary  Reason for Admission: induction of labor for post-dates Prenatal Procedures: none Intrapartum Procedures: spontaneous vaginal delivery by Horald Chestnut CNM Postpartum Procedures: P.P. tubal ligation by Dr Normand Sloop Complications-Operative and Postpartum: none  Hemoglobin  Date Value Range Status  01/10/2013 7.4* 12.0 - 15.0 g/dL Final     DELTA CHECK NOTED     REPEATED TO VERIFY     HCT  Date Value Range Status  01/10/2013 25.0* 36.0 - 46.0 % Final    Discharge Diagnoses: Term Pregnancy-delivered  Discharge Information:  Date: 01/10/2013 Activity: unrestricted Diet: routine Medications: Ibuprofen and Percocet Condition: stable  Breastfeeding: yes  Instructions: refer to practice specific booklet Discharge to: home   Newborn Data: Live born  Information for the patient's newborn:  Dayelin, Balducci [[id number]]  female   Home with mother.  Kyliana Standen A MD 01/10/2013, 12:08 PM

## 2013-01-11 LAB — TYPE AND SCREEN
ABO/RH(D): A POS
Unit division: 0

## 2013-01-14 ENCOUNTER — Telehealth: Payer: Self-pay | Admitting: Obstetrics and Gynecology

## 2013-01-14 NOTE — Telephone Encounter (Signed)
[employer] from [employer] called w/ pt's BP today.  Pt had a vaginal delivery on 01/08/13 and was not on any BP meds during the pregnancy and is not on any meds now.   BP was 148/88, then rechecked her BP and was the same.  Pt denied any HA, visual changes and there was no swelling.  Consulted w/ AR, Jeanie will recheck pt's BP on Monday and will call with the report.

## 2013-01-17 ENCOUNTER — Ambulatory Visit: Payer: 59 | Admitting: Obstetrics and Gynecology

## 2013-01-17 ENCOUNTER — Telehealth: Payer: Self-pay | Admitting: Obstetrics and Gynecology

## 2013-01-17 ENCOUNTER — Encounter: Payer: Self-pay | Admitting: Obstetrics and Gynecology

## 2013-01-17 VITALS — BP 142/80 | Ht 62.0 in | Wt 166.0 lb

## 2013-01-17 DIAGNOSIS — O1002 Pre-existing essential hypertension complicating childbirth: Secondary | ICD-10-CM

## 2013-01-17 LAB — CBC
HCT: 29.6 % — ABNORMAL LOW (ref 36.0–46.0)
MCV: 65.6 fL — ABNORMAL LOW (ref 78.0–100.0)
RDW: 19.3 % — ABNORMAL HIGH (ref 11.5–15.5)
WBC: 4.1 10*3/uL (ref 4.0–10.5)

## 2013-01-17 LAB — COMPREHENSIVE METABOLIC PANEL
ALT: 22 U/L (ref 0–35)
CO2: 27 mEq/L (ref 19–32)
Creat: 0.64 mg/dL (ref 0.50–1.10)
Total Bilirubin: 0.5 mg/dL (ref 0.3–1.2)

## 2013-01-17 NOTE — Telephone Encounter (Signed)
Consult with DR SR regarding pt's BP. Per Dr SR, pt to office or MAu for BP check, PIH labs, and to do 24 hour urine. Pt prefers office eval.  Sched with DR AVS.

## 2013-01-17 NOTE — Progress Notes (Signed)
HISTORY OF PRESENT ILLNESS  [name] is a 32 y.o. year old female,[id number], who presents for a problem visit. The patient had a vaginal delivery on January 08, 2013.  She was evaluated by the home health nurse her blood pressure was elevated.  Subjective:  The patient complains of a headache.  She is not getting much sleep.  She denies blurred vision and right upper quadrant tenderness.  Objective:  BP 142/80  Ht 5\' 2"  (1.575 m)  Wt 166 lb (75.297 kg)  BMI 30.35 kg/m2  LMP 04/01/2012   General: no distress Resp: clear to auscultation bilaterally Cardio: regular rate and rhythm, S1, S2 normal, no murmur, click, rub or gallop GI: soft and nontender Extremities: normal reflexes, no clonus  Exam deferred.  Assessment:  History of hypertension  Pregnancy-induced/postpartum hypertension  Plan:  24-hour urine collection  CBC, CMP, LDH, uric acid  Preeclampsia discussed  Return to office in 1 day(s).   Leonard Schwartz M.D.  01/17/2013 6:07 PM

## 2013-01-17 NOTE — Telephone Encounter (Signed)
VM from [facility], [employer] nurse. Pt's BP today was 156/88, rt arm and 150/88.  Had headache 01/16/13 but not today.  No edema. No visual disturbances.  Information left on DR SR VM.

## 2013-01-19 ENCOUNTER — Telehealth: Payer: Self-pay | Admitting: Obstetrics and Gynecology

## 2013-01-19 ENCOUNTER — Other Ambulatory Visit: Payer: 59

## 2013-01-19 DIAGNOSIS — O1002 Pre-existing essential hypertension complicating childbirth: Secondary | ICD-10-CM

## 2013-01-19 LAB — CREATININE, URINE, 24 HOUR: Creatinine, 24H Ur: 1742 mg/d (ref 700–1800)

## 2013-01-19 NOTE — Telephone Encounter (Signed)
Labs called on 24 hr urine protein.  Message left for pt to call with any concerns.  Will plan to have [facility] nurse check BP next week.

## 2013-01-20 LAB — LACTATE DEHYDROGENASE, ISOENZYMES
LD1/LD2 Ratio: 0.8
LDH 2: 32 % (ref 30–43)
LDH 3: 25 % (ref 16–26)
LDH 5: 8 % (ref 3–14)

## 2013-01-27 NOTE — Telephone Encounter (Signed)
Pc from [facility] Nurse) and states,"will check pt's BP on 01/28/13, if able to get in touch with pt". Caller agrees.

## 2013-01-27 NOTE — Telephone Encounter (Signed)
Lm on vm with Jeanie Church([employer] Nurse) to cb to have pt's BP checked this week per [facility].

## 2013-01-31 ENCOUNTER — Telehealth: Payer: Self-pay

## 2013-01-31 NOTE — Telephone Encounter (Signed)
Pc from [facility] Nurse). Caller was unable to contact pt on 01/28/23 to have BP checked per [facility]. Pc to pt. Pt states,"was out of town". Informed pt smart start nurse will be out to check BP on 02/01/13. Will have nurse call pt to set up. Pt voices understanding and agrees.

## 2013-02-01 ENCOUNTER — Telehealth: Payer: Self-pay

## 2013-02-01 NOTE — Telephone Encounter (Signed)
Pt with bp reading of 148-146/96. Pt c/o headaches;however not occuring yesterday. Pt with a "bad headache" yesterday. Pt denies visual changes or edema. Will consult with [facility] per recs.

## 2013-02-02 NOTE — Telephone Encounter (Signed)
Correction to 02/01/13 telephone call. Pt c/o headaches;however not occuring daily.

## 2013-02-02 NOTE — Telephone Encounter (Signed)
Lm on vm with [employer]) to cb to make aware of [facility] recs.

## 2013-02-02 NOTE — Telephone Encounter (Signed)
Please review all meds that pt is taking and arrange for [facility] nurse to take bp for first available.

## 2013-02-03 NOTE — Telephone Encounter (Signed)
Pc from [employer]). Vm states,"unable to go back out to check pt's BP a 4th time due to a lot of recent deliveries and follow ups needed". Advised pt to be brought into the office for a BP check instead. Will make [facility] aware.

## 2013-02-04 NOTE — Telephone Encounter (Signed)
Tc to pt. Post partum and bp check sched 02/09/13 @ 4:15p with CA. Pt agrees.

## 2013-02-09 ENCOUNTER — Ambulatory Visit: Payer: 59 | Admitting: Certified Nurse Midwife

## 2013-02-09 ENCOUNTER — Encounter: Payer: Self-pay | Admitting: Certified Nurse Midwife

## 2013-02-09 VITALS — BP 120/80 | HR 78 | Temp 98.4°F | Resp 18 | Wt 169.0 lb

## 2013-02-09 DIAGNOSIS — O1002 Pre-existing essential hypertension complicating childbirth: Secondary | ICD-10-CM

## 2013-02-09 DIAGNOSIS — O9081 Anemia of the puerperium: Secondary | ICD-10-CM

## 2013-02-09 NOTE — Progress Notes (Signed)
Pt. Is here for PP b/p check. Not on medications before or after del. Per pt. C/O fatique but moved here with partner from [place] and misses family and friends Has had some PP depression but improving. Denies suicidal tendencies, etc. Bleeding rare now.  Taking vitamins, fe (at times) and eating more greens.  Baby is doing well and BF well per report.  Pt. Has been monitoring B/P at home but did not bring documentation (will bring at next vs)  States it is up and down. ? Numbers. O-   Alert and oriented  Bonding well with baby at present  Thyroid -WNL  Heart- RRR with out murmur  Lungs  Clear Bil.   C To A  Abd. Soft and non-tender  No CVAT  Extrem: No edema, patellar reflexes 2+, Homan's sign neg. A- S/P BTL and del. X one month  Rare pain  H/O mild depression but improving  H/O low Fe (improving)  H/O abn. Pap smears (due for one) elects to have it done next week with b/p check  C. Armer, CNM, FNP-BC P- B/P re Check in one week with pap smear and pelvic  Check HGB.

## 2013-02-16 ENCOUNTER — Encounter: Payer: Self-pay | Admitting: Family Medicine

## 2013-02-16 ENCOUNTER — Ambulatory Visit: Payer: 59 | Admitting: Family Medicine

## 2013-02-16 ENCOUNTER — Other Ambulatory Visit: Payer: Self-pay | Admitting: Family Medicine

## 2013-02-16 VITALS — BP 170/120 | Resp 12 | Wt 171.0 lb

## 2013-02-16 DIAGNOSIS — I10 Essential (primary) hypertension: Secondary | ICD-10-CM

## 2013-02-16 LAB — HEMOGLOBIN: Hemoglobin: 10.2 g/dL — ABNORMAL LOW (ref 12.0–15.0)

## 2013-02-16 MED ORDER — NIFEDIPINE ER OSMOTIC RELEASE 30 MG PO TB24: 30.0000 mg | ORAL_TABLET | Freq: Every day | ORAL | Status: DC

## 2013-02-16 NOTE — Progress Notes (Signed)
S: Patient her for f/u HTN.  BP log from home 155-170/80-90.  Pt complains of headaches 2-3 times a day.  Generalized dull pain with blurred vision.  Eye exam yesterday and prescribed glasses (slight improvement in vision).   Denies spots, floaters, RUQ.  Denies CP or SOB. Haven't taking anything for headaches.   O: Pt sitting on exam table in NAD. Face symmetrical, speech clear and conversation appropriate.  CN III, V, VII, grossly intact.    A/P: CHTN: Discussed s/s of stroke (FACES) with patient, signs of angina and understands to go call 911 or go to the ED. Declines ED visit today.                   Will start Procardia XL 30 mg.   Contine BP log, BID.  BUN/CR: WNL      HA: Tylenol 2 tablets q 4-6 hours around the clock for now.  (10 total for day).                    Pt may f/u here in office in 1 week, but strongly encouraged patient to f/u with PCP.  Lynnell Jude, FNP-BC

## 2013-02-17 ENCOUNTER — Other Ambulatory Visit: Payer: Self-pay | Admitting: Family Medicine

## 2013-02-17 ENCOUNTER — Telehealth: Payer: Self-pay | Admitting: Obstetrics and Gynecology

## 2013-02-17 MED ORDER — FERROUS SULFATE 325 (65 FE) MG PO TABS: 325.0000 mg | ORAL_TABLET | Freq: Once | ORAL | Status: DC

## 2013-02-17 NOTE — Telephone Encounter (Signed)
Message copied by Mason Jim on Thu Feb 17, 2013  9:54 AM ------      Message from: Elane Fritz      Created: Thu Feb 17, 2013  8:38 AM       Pt needs to go and get Procardia from pharmacy and Start Iron 325mg  PO daily (both at pharmacy).            Thanks,       L.Montez Morita, FNP-BC ------

## 2013-02-17 NOTE — Telephone Encounter (Signed)
TC to pt. Pt's mother states pt is not there. Will try to contact pt and have her call office.

## 2013-02-17 NOTE — Telephone Encounter (Signed)
TC to pt. LM to return call.ASAP.  

## 2013-02-17 NOTE — Telephone Encounter (Signed)
TC from pt. Per LC, informed needs to start Procardia and Fe. Both  Rx have been ordered at pharmacy. To keep appt here 03/2013 but to F/u with PCP  In one  week. Pt verbalizes comprehension.

## 2013-02-17 NOTE — Telephone Encounter (Signed)
Message copied by Mason Jim on Thu Feb 17, 2013 12:23 PM ------      Message from: Elane Fritz      Created: Thu Feb 17, 2013  8:38 AM       Pt needs to go and get Procardia from pharmacy and Start Iron 325mg  PO daily (both at pharmacy).            Thanks,       L.Montez Morita, FNP-BC ------

## 2013-02-21 ENCOUNTER — Encounter: Payer: 59 | Admitting: Obstetrics and Gynecology

## 2013-03-03 DIAGNOSIS — Z0000 Encounter for general adult medical examination without abnormal findings: Secondary | ICD-10-CM | POA: Insufficient documentation
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
## 2013-10-13 ENCOUNTER — Other Ambulatory Visit: Payer: Self-pay

## 2014-10-09 ENCOUNTER — Encounter: Payer: Self-pay | Admitting: Family Medicine

## 2014-12-16 ENCOUNTER — Encounter (HOSPITAL_COMMUNITY): Payer: Self-pay | Admitting: Emergency Medicine

## 2014-12-16 ENCOUNTER — Emergency Department (HOSPITAL_COMMUNITY)
Admission: EM | Admit: 2014-12-16 | Discharge: 2014-12-16 | Disposition: A | Discharge status: Home / Self Care | Payer: 59 | Attending: Emergency Medicine | Admitting: Emergency Medicine

## 2014-12-16 DIAGNOSIS — Z8719 Personal history of other diseases of the digestive system: Secondary | ICD-10-CM | POA: Insufficient documentation

## 2014-12-16 DIAGNOSIS — Z87440 Personal history of urinary (tract) infections: Secondary | ICD-10-CM | POA: Insufficient documentation

## 2014-12-16 DIAGNOSIS — L02415 Cutaneous abscess of right lower limb: Secondary | ICD-10-CM | POA: Diagnosis present

## 2014-12-16 DIAGNOSIS — Z3202 Encounter for pregnancy test, result negative: Secondary | ICD-10-CM | POA: Diagnosis not present

## 2014-12-16 DIAGNOSIS — Z8619 Personal history of other infectious and parasitic diseases: Secondary | ICD-10-CM | POA: Insufficient documentation

## 2014-12-16 DIAGNOSIS — D649 Anemia, unspecified: Secondary | ICD-10-CM

## 2014-12-16 DIAGNOSIS — L03115 Cellulitis of right lower limb: Secondary | ICD-10-CM

## 2014-12-16 DIAGNOSIS — Z79899 Other long term (current) drug therapy: Secondary | ICD-10-CM | POA: Insufficient documentation

## 2014-12-16 DIAGNOSIS — Z792 Long term (current) use of antibiotics: Secondary | ICD-10-CM | POA: Insufficient documentation

## 2014-12-16 DIAGNOSIS — Z791 Long term (current) use of non-steroidal anti-inflammatories (NSAID): Secondary | ICD-10-CM | POA: Diagnosis not present

## 2014-12-16 DIAGNOSIS — Z8639 Personal history of other endocrine, nutritional and metabolic disease: Secondary | ICD-10-CM | POA: Diagnosis not present

## 2014-12-16 DIAGNOSIS — Z8659 Personal history of other mental and behavioral disorders: Secondary | ICD-10-CM | POA: Insufficient documentation

## 2014-12-16 DIAGNOSIS — L02419 Cutaneous abscess of limb, unspecified: Secondary | ICD-10-CM

## 2014-12-16 DIAGNOSIS — Z8679 Personal history of other diseases of the circulatory system: Secondary | ICD-10-CM | POA: Diagnosis not present

## 2014-12-16 DIAGNOSIS — Z91040 Latex allergy status: Secondary | ICD-10-CM | POA: Insufficient documentation

## 2014-12-16 DIAGNOSIS — E860 Dehydration: Secondary | ICD-10-CM | POA: Insufficient documentation

## 2014-12-16 DIAGNOSIS — Z8742 Personal history of other diseases of the female genital tract: Secondary | ICD-10-CM | POA: Diagnosis not present

## 2014-12-16 LAB — CBC WITH DIFFERENTIAL/PLATELET
BASOS ABS: 0 10*3/uL (ref 0.0–0.1)
BASOS PCT: 0 % (ref 0–1)
Eosinophils Absolute: 0 10*3/uL (ref 0.0–0.7)
Eosinophils Relative: 0 % (ref 0–5)
HCT: 27 % — ABNORMAL LOW (ref 36.0–46.0)
Hemoglobin: 7.3 g/dL — ABNORMAL LOW (ref 12.0–15.0)
LYMPHS ABS: 1.1 10*3/uL (ref 0.7–4.0)
Lymphocytes Relative: 15 % (ref 12–46)
MCH: 16.8 pg — ABNORMAL LOW (ref 26.0–34.0)
MCHC: 27 g/dL — AB (ref 30.0–36.0)
MCV: 62.2 fL — ABNORMAL LOW (ref 78.0–100.0)
MONO ABS: 0.5 10*3/uL (ref 0.1–1.0)
Monocytes Relative: 7 % (ref 3–12)
NEUTROS PCT: 78 % — AB (ref 43–77)
Neutro Abs: 5.7 10*3/uL (ref 1.7–7.7)
PLATELETS: 356 10*3/uL (ref 150–400)
RBC: 4.34 MIL/uL (ref 3.87–5.11)
RDW: 19.5 % — ABNORMAL HIGH (ref 11.5–15.5)
WBC: 7.3 10*3/uL (ref 4.0–10.5)

## 2014-12-16 LAB — POC URINE PREG, ED: PREG TEST UR: NEGATIVE

## 2014-12-16 LAB — I-STAT CHEM 8, ED
BUN: 15 mg/dL (ref 6–23)
CALCIUM ION: 1.3 mmol/L — AB (ref 1.12–1.23)
CREATININE: 0.8 mg/dL (ref 0.50–1.10)
Chloride: 105 mEq/L (ref 96–112)
Glucose, Bld: 102 mg/dL — ABNORMAL HIGH (ref 70–99)
HCT: 30 % — ABNORMAL LOW (ref 36.0–46.0)
HEMOGLOBIN: 10.2 g/dL — AB (ref 12.0–15.0)
POTASSIUM: 4.1 mmol/L (ref 3.5–5.1)
SODIUM: 140 mmol/L (ref 135–145)
TCO2: 19 mmol/L (ref 0–100)

## 2014-12-16 LAB — URINE MICROSCOPIC-ADD ON

## 2014-12-16 LAB — URINALYSIS, ROUTINE W REFLEX MICROSCOPIC
BILIRUBIN URINE: NEGATIVE
Glucose, UA: NEGATIVE mg/dL
Hgb urine dipstick: NEGATIVE
Ketones, ur: NEGATIVE mg/dL
LEUKOCYTES UA: NEGATIVE
Nitrite: NEGATIVE
PH: 6.5 (ref 5.0–8.0)
Protein, ur: 30 mg/dL — AB
Specific Gravity, Urine: 1.028 (ref 1.005–1.030)
UROBILINOGEN UA: 0.2 mg/dL (ref 0.0–1.0)

## 2014-12-16 LAB — I-STAT TROPONIN, ED: Troponin i, poc: 0 ng/mL (ref 0.00–0.08)

## 2014-12-16 MED ORDER — MORPHINE SULFATE 4 MG/ML IJ SOLN
4.0000 mg | Freq: Once | INTRAMUSCULAR | Status: AC
  Administered 2014-12-16: 4 mg via INTRAMUSCULAR
  Filled 2014-12-16: qty 1

## 2014-12-16 MED ORDER — HYDROCODONE-ACETAMINOPHEN 5-325 MG PO TABS: 2.0000 | ORAL_TABLET | ORAL | Status: DC | PRN

## 2014-12-16 NOTE — Discharge Instructions (Signed)
Return to the emergency room with worsening of symptoms, new symptoms or with symptoms that are concerning, especially fevers, nausea, vomiting, increased redness, increased pain, chest pain, shortness of breath, fainting. Follow-up with the urgent care as scheduled. Norco for severe pain. Do not operate machinery, drive or drink alcohol while taking narcotics or muscle relaxers. Continue with warm compresses and flushing. Follow abscess care below.   Abscess Care After An abscess (also called a boil or furuncle) is an infected area that contains a collection of pus. Signs and symptoms of an abscess include pain, tenderness, redness, or hardness, or you may feel a moveable soft area under your skin. An abscess can occur anywhere in the body. The infection may spread to surrounding tissues causing cellulitis. A cut (incision) by the surgeon was made over your abscess and the pus was drained out. Gauze may have been packed into the space to provide a drain that will allow the cavity to heal from the inside outwards. The boil may be painful for 5 to 7 days. Most people with a boil do not have high fevers. Your abscess, if seen early, may not have localized, and may not have been lanced. If not, another appointment may be required for this if it does not get better on its own or with medications. HOME CARE INSTRUCTIONS   Only take over-the-counter or prescription medicines for pain, discomfort, or fever as directed by your caregiver.  When you bathe, soak and then remove gauze or iodoform packs at least daily or as directed by your caregiver. You may then wash the wound gently with mild soapy water. Repack with gauze or do as your caregiver directs. SEEK IMMEDIATE MEDICAL CARE IF:   You develop increased pain, swelling, redness, drainage, or bleeding in the wound site.  You develop signs of generalized infection including muscle aches, chills, fever, or a general ill feeling.  An oral temperature  above 102 F (38.9 C) develops, not controlled by medication. See your caregiver for a recheck if you develop any of the symptoms described above. If medications (antibiotics) were prescribed, take them as directed. Document Released: 06/12/2005 Document Revised: 02/16/2012 Document Reviewed: 02/07/2008 [facility] Patient Information 2015 [place], [place]. This information is not intended to replace advice given to you by your health care provider. Make sure you discuss any questions you have with your health care provider.

## 2014-12-16 NOTE — ED Provider Notes (Signed)
CSN: [id number]     Arrival date & time 12/16/14  1040 History   First MD Initiated Contact with Patient 12/16/14 1054     Chief Complaint  Patient presents with  . Abscess     (Consider location/radiation/quality/duration/timing/severity/associated sxs/prior Treatment) HPI  Patricia Lin is a 34 y.o. female with PMH of presenting with increased pain at site of abscess. Patient was seen 4 days ago after she was bitten by an unidentified and developed an abscess. She states she had an I and D and purulent drainage was expelled. Patient was put on Bactrim and has taken it for 1 day. This morning she woke up with increased pain and stated she had one episode of transient lightheadedness while she was on the toilet. She denied any other symptoms and this quickly resolved. Patient denies any fevers, chills, nausea or vomiting. No headache, slurred speech or weakness. Pt denies DM. Pt has taken percocet with improvement of her symptoms. She states ibuprofen does not help much. Pt denies pregnancy. Her tubes are tied. Pt denies vaginal bleeding. LMP normal and in December.   Past Medical History  Diagnosis Date  . Hx of breast reduction, elective   . Tachycardia   . Chlamydia infection   . BV (bacterial vaginosis) 2011  . H/O varicella   . Hx: UTI (urinary tract infection)   . Anemia   . H/O vitamin D deficiency   . CIN I (cervical intraepithelial neoplasia I) 2008  . Vulvitis 2009  . Dysuria 2010  . Pregnancy induced hypertension     ALL PREGNANCIES  . Infection     UTI DURING PREGNANCY  . Infection     YEAST WITH PREGNANCY  . Infection     CHLAMYDIA X 1  . Shortness of breath     WITH EXERTION SICNE PREGNANCY  . Headache(784.0)     MIGRAINES  . Liver disease 2012    UNKOWN DIAGNOSIS  . Tachycardia 2010    HAS HAD WORKUP. UNSURE IF HAS POSSIBLE HEART VALVE ISSUE  . Dysrhythmia 2009    TACHYCARDIA;   HAD W/U 2010? POSSIBLE HEART VALVE ISSUE; NOT F/U NEEDED X 3 YEARS PER PT  . 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 Postpartum depression 2011    NO MEDS  . Low iron   . H/O pre-eclampsia in prior pregnancy, currently pregnant   . Abnormal Pap smear   . PIH (pregnancy induced hypertension) 11-2012    Today pp one month.  B/P nl, and 122/76 on repeat-sitting   Past Surgical History  Procedure Laterality Date  . Dilation and curettage of uterus    . [place] echocardiography  06/18/2009    ef 55-60%  . Wisdom tooth extraction    . Breast reduction surgery  2008  . Finger surgery      Right index finger  . Breast surgery    . Tubal ligation  01/09/2013    Procedure: POST PARTUM TUBAL LIGATION;  Surgeon: Michael LitterNaima A Dillard, MD;  Location: [facility];  Service: Gynecology;  Laterality: Bilateral;  post partum tubal ligation bilateral   Family History  Problem Relation Age of Onset  . Hypertension Mother   . Hyperlipidemia Mother   . Coronary artery disease Mother   . Heart disease Mother     high cholesterol  . Asthma Mother   . Diabetes Mother   . Hypertension Father   . Hypertension Maternal Grandmother   .  Hypertension Maternal Grandfather   . Asthma Daughter    History  Substance Use Topics  . Smoking status: Passive Smoke Exposure - Never Smoker  . Smokeless tobacco: Never Used  . Alcohol Use: Yes     Comment: WEEKENDS -LIQUOR;  LAST DRANK 04/03/12   OB History    Gravida Para Term Preterm AB TAB SAB Ectopic Multiple Living   Obstetric Comments   2011 PRECLAMPSIA PP; IN ICU X 3 DAYS     Review of Systems  Constitutional: Negative for fever and chills.  Eyes: Negative for visual disturbance.  Respiratory: Negative for cough and shortness of breath.   Cardiovascular: Negative for chest pain and palpitations.  Gastrointestinal: Negative for nausea, vomiting and diarrhea.  Genitourinary: Negative for dysuria and hematuria.  Musculoskeletal: Positive for myalgias. Negative for back  pain and gait problem.  Skin: Negative for rash.  Neurological: Negative for weakness and headaches.      Allergies  Latex  Home Medications   Prior to Admission medications   Medication Sig Start Date End Date Taking? Authorizing Provider  ibuprofen (ADVIL,MOTRIN) 800 MG tablet Take 800 mg by mouth 3 (three) times daily. 12/15/14  Yes Historical Provider, MD  sulfamethoxazole-trimethoprim (BACTRIM DS,SEPTRA DS) 800-160 MG per tablet Take 1 tablet by mouth 2 (two) times daily. 12/15/14  Yes Historical Provider, MD  ferrous sulfate 325 (65 FE) MG tablet Take 1 tablet (325 mg total) by mouth once. Patient not taking: Reported on 12/16/2014 02/17/13   Elane Fritz, FNP  HYDROcodone-acetaminophen (NORCO/VICODIN) 5-325 MG per tablet Take 2 tablets by mouth every 4 (four) hours as needed for moderate pain or severe pain. 12/16/14   Louann Sjogren, PA-C  ibuprofen (ADVIL,MOTRIN) 600 MG tablet Take 1 tablet (600 mg total) by mouth every 6 (six) hours. Patient not taking: Reported on 12/16/2014 01/10/13   Silverio Lay, MD  NIFEdipine (PROCARDIA XL) 30 MG 24 hr tablet Take 1 tablet (30 mg total) by mouth daily. Patient not taking: Reported on 12/16/2014 02/16/13   Elane Fritz, FNP  oxyCODONE-acetaminophen (PERCOCET/ROXICET) 5-325 MG per tablet Take 1-2 tablets by mouth every 4 (four) hours as needed (moderate - severe pain). Patient not taking: Reported on 12/16/2014 01/10/13   Silverio Lay, MD   BP 118/78 mmHg  Pulse 68  Temp(Src) 99 F (37.2 C) (Oral)  Resp 16  SpO2 100%  LMP 11/22/2014 Physical Exam  Constitutional: She appears well-developed and well-nourished. No distress.  HENT:  Head: Normocephalic and atraumatic.  Mouth/Throat: Oropharynx is clear and moist.  Eyes: Conjunctivae and EOM are normal. Pupils are equal, round, and reactive to light. Right eye exhibits no discharge. Left eye exhibits no discharge.  Neck: Normal range of motion. Neck supple.  No nuchal rigidity  Cardiovascular:  Normal rate and regular rhythm.   Pulmonary/Chest: Effort normal and breath sounds normal. No respiratory distress. She has no wheezes.  Abdominal: Soft. Bowel sounds are normal. She exhibits no distension. There is no tenderness.  Neurological: She is alert. No cranial nerve deficit. Coordination normal.  Speech is clear and goal oriented. Strength 5/5 in upper and lower extremities. Sensation intact. No pronator drift. Antalgic gait.   Skin: Skin is warm and dry. She is not diaphoretic.  Patient with lesion to right lateral calf with spontaneous serous drainage without any fluctuance. She does have surrounding erythema and induration.  Nursing note and vitals reviewed.   ED Course  Procedures (including critical care time) Labs Review Labs Reviewed  CBC WITH DIFFERENTIAL - Abnormal; Notable for the following:    Hemoglobin 7.3 (*)    HCT 27.0 (*)    MCV 62.2 (*)    MCH 16.8 (*)    MCHC 27.0 (*)    RDW 19.5 (*)    Neutrophils Relative % 78 (*)    All other components within normal limits  URINALYSIS, ROUTINE W REFLEX MICROSCOPIC - Abnormal; Notable for the following:    APPearance CLOUDY (*)    Protein, ur 30 (*)    All other components within normal limits  URINE MICROSCOPIC-ADD ON - Abnormal; Notable for the following:    Squamous Epithelial / LPF MANY (*)    Bacteria, UA FEW (*)    All other components within normal limits  I-STAT CHEM 8, ED - Abnormal; Notable for the following:    Glucose, Bld 102 (*)    Calcium, Ion 1.30 (*)    Hemoglobin 10.2 (*)    HCT 30.0 (*)    All other components within normal limits  POC URINE PREG, ED  I-STAT TROPOININ, ED    Imaging Review No results found.   EKG Interpretation None      MDM   Final diagnoses:  Abscess of leg  Cellulitis of right lower extremity  Mild anemia   Patient with worsening pain of abscess. She denies any fevers, chills no nausea or vomiting. Abscess with serous drainage in expected induration. She  has only had one day of antibiotic treatment. I doubt tx failure and abscess has not reformed. Patient able to ambulate with pain medications and gave her short prescription for narcotic pain relief. Patient with follow-up appointment with urgent care tomorrow. Patient also with some mild lightheadedness while using the restroom. It was transient and she denies at this time. She denies any chest pain, SOB.Patient with some evidence of dehydration in her urine and is mildly orthostatic. Patient is mildly anemic and denies any vaginal discharge. She is not pregnant. Patient without electrolyte abnormalities. Lightheadedness likely due to mild dehydration. Patient tolerating fluids in the ED. Discussed the importance tenths of oral hydration. Patient given strict return precautions.  Discussed return precautions with patient. Discussed all results and patient verbalizes understanding and agrees with plan.  Case has been discussed with Dr. Lynelle Doctor who agrees with the above plan and to discharge.     Louann Sjogren, PA-C 12/16/14 1614  Linwood Dibbles, MD 12/17/14 [phone]

## 2014-12-16 NOTE — ED Notes (Signed)
Pt getting dressed and awaiting discharge paperwork at bedside.  

## 2014-12-16 NOTE — ED Notes (Signed)
NAD at this time. Pt getting dressed. Wheel chair at the bedside.

## 2014-12-16 NOTE — ED Notes (Addendum)
Pt reports that she was bit by a bug on the 5th of this month. Pt reports that she went to urgent care for increased swelling and pain at the bite on her right calf. Pt reports that she woke up this morning and it had gotten bigger and she felt dizzy and light headed. Pt currently taking sulfa antibiotics.

## 2014-12-16 NOTE — ED Notes (Signed)
Pt remains monitored by blood pressure and pulse ox.  

## 2016-09-09 ENCOUNTER — Ambulatory Visit (HOSPITAL_COMMUNITY)
Admission: EM | Admit: 2016-09-09 | Discharge: 2016-09-09 | Disposition: A | Discharge status: Home / Self Care | Payer: 59 | Attending: Family Medicine | Admitting: Family Medicine

## 2016-09-09 ENCOUNTER — Ambulatory Visit (HOSPITAL_COMMUNITY): Payer: 59

## 2016-09-09 ENCOUNTER — Ambulatory Visit (INDEPENDENT_AMBULATORY_CARE_PROVIDER_SITE_OTHER): Payer: 59

## 2016-09-09 ENCOUNTER — Encounter (HOSPITAL_COMMUNITY): Payer: Self-pay | Admitting: Emergency Medicine

## 2016-09-09 DIAGNOSIS — M545 Low back pain: Secondary | ICD-10-CM | POA: Diagnosis not present

## 2016-09-09 DIAGNOSIS — W19XXXA Unspecified fall, initial encounter: Secondary | ICD-10-CM

## 2016-09-09 DIAGNOSIS — M25551 Pain in right hip: Secondary | ICD-10-CM

## 2016-09-09 MED ORDER — CYCLOBENZAPRINE HCL 10 MG PO TABS: 10.0000 mg | ORAL_TABLET | Freq: Two times a day (BID) | ORAL | 0 refills | Status: DC | PRN

## 2016-09-09 NOTE — ED Triage Notes (Signed)
[facility] a [facility] right leg pain that starts just above her right knee and radiates into her lower back secondary to a fall that occurred yesterday. The patient reported that she was playing Limbo and fell backwards with her legs underneath her.

## 2016-09-09 NOTE — ED Provider Notes (Signed)
CSN: [id number]     Arrival date & time 09/09/16  1249 History   First MD Initiated Contact with Patient 09/09/16 1351     Chief Complaint  Patient presents with  . Fall   (Consider location/radiation/quality/duration/timing/severity/associated sxs/prior Treatment) HPI 35 y/o female, was performing limbo, when she fell backwards, felt a pop in her right hip. Also with right low back pain. Injury happened yesterday. OTC meds for treatment at home unable to work today.  Past Medical History:  Diagnosis Date  . Abnormal Pap smear   . Anemia   . BV (bacterial vaginosis) 2011  . Chlamydia infection   . CIN I (cervical intraepithelial neoplasia I) 2008  . Dysrhythmia 2009   TACHYCARDIA;   HAD W/U 2010? POSSIBLE HEART VALVE ISSUE; NOT F/U NEEDED X 3 YEARS PER PT  . Dysuria 2010  . H/O pre-eclampsia in prior pregnancy, currently pregnant   . H/O varicella   . H/O vitamin D deficiency   . Headache(784.0)    MIGRAINES  . Hx of breast reduction, elective   . Hx: UTI (urinary tract infection)   . Infection    UTI DURING PREGNANCY  . Infection    YEAST WITH PREGNANCY  . Infection    CHLAMYDIA X 1  . 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 Liver disease 2012   UNKOWN DIAGNOSIS  . Low iron   . PIH (pregnancy induced hypertension) 11-2012   Today pp one month.  B/P nl, and 122/76 on repeat-sitting  . Postpartum depression 2011   NO MEDS  . Pregnancy induced hypertension    ALL PREGNANCIES  . Shortness of breath    WITH EXERTION SICNE PREGNANCY  . Tachycardia   . Tachycardia 2010   HAS HAD WORKUP. UNSURE IF HAS POSSIBLE HEART VALVE ISSUE  . Vulvitis 2009   Past Surgical History:  Procedure Laterality Date  . BREAST REDUCTION SURGERY  2008  . BREAST SURGERY    . DILATION AND CURETTAGE OF UTERUS    . FINGER SURGERY     Right index finger  . TUBAL LIGATION  01/09/2013   Procedure: POST PARTUM TUBAL LIGATION;  Surgeon: Michael LitterNaima A  Dillard, MD;  Location: [facility];  Service: Gynecology;  Laterality: Bilateral;  post partum tubal ligation bilateral  . US ECHOCARDIOGRAPHY  06/18/2009   ef 55-60%  . WISDOM TOOTH EXTRACTION     Family History  Problem Relation Age of Onset  . Hypertension Mother   . Hyperlipidemia Mother   . Coronary artery disease Mother   . Heart disease Mother     high cholesterol  . Asthma Mother   . Diabetes Mother   . Hypertension Father   . Hypertension Maternal Grandmother   . Hypertension Maternal Grandfather   . Asthma Daughter    Social History  Substance Use Topics  . Smoking status: Passive Smoke Exposure - Never Smoker  . Smokeless tobacco: Never Used  . Alcohol use Yes     Comment: WEEKENDS -LIQUOR;  LAST DRANK 04/03/12   OB History    Gravida Para Term Preterm AB Living   7 4 4   3 4    SAB TAB Ectopic Multiple Live Births   1 2     4       Obstetric Comments   2011 PRECLAMPSIA PP; IN ICU X 3 DAYS     Review of Systems  Denies: HEADACHE, NAUSEA, ABDOMINAL PAIN, CHEST PAIN, CONGESTION, DYSURIA,  SHORTNESS OF BREATH  Allergies  Latex  Home Medications   Prior to Admission medications   Medication Sig Start Date End Date Taking? Authorizing Provider  ibuprofen (ADVIL,MOTRIN) 600 MG tablet Take 1 tablet (600 mg total) by mouth every 6 (six) hours. 01/10/13  Yes Silverio Lay, MD  ferrous sulfate 325 (65 FE) MG tablet Take 1 tablet (325 mg total) by mouth once. Patient not taking: Reported on 12/16/2014 02/17/13   Elane Fritz, FNP  HYDROcodone-acetaminophen (NORCO/VICODIN) 5-325 MG per tablet Take 2 tablets by mouth every 4 (four) hours as needed for moderate pain or severe pain. 12/16/14   Oswaldo Conroy, PA-C  ibuprofen (ADVIL,MOTRIN) 800 MG tablet Take 800 mg by mouth 3 (three) times daily. 12/15/14   Historical Provider, MD  NIFEdipine (PROCARDIA XL) 30 MG 24 hr tablet Take 1 tablet (30 mg total) by mouth daily. Patient not taking: Reported on 12/16/2014 02/16/13   Elane Fritz, FNP  oxyCODONE-acetaminophen (PERCOCET/ROXICET) 5-325 MG per tablet Take 1-2 tablets by mouth every 4 (four) hours as needed (moderate - severe pain). Patient not taking: Reported on 12/16/2014 01/10/13   Silverio Lay, MD  sulfamethoxazole-trimethoprim (BACTRIM DS,SEPTRA DS) 800-160 MG per tablet Take 1 tablet by mouth 2 (two) times daily. 12/15/14   Historical Provider, MD   Meds Ordered and Administered this Visit  Medications - No data to display  BP 137/84 (BP Location: Right Arm)   Pulse 79   Temp 98.8 F (37.1 C) (Oral)   Resp 18   LMP 08/19/2016 (Exact Date)   SpO2 98%  No data found.   Physical Exam NURSES NOTES AND VITAL SIGNS REVIEWED. CONSTITUTIONAL: Well developed, well nourished, no acute distress HEENT: normocephalic, atraumatic EYES: Conjunctiva normal NECK:normal ROM, supple, no adenopathy PULMONARY:No respiratory distress, normal effort ABDOMINAL: Soft, ND, NT BS+, No CVAT MUSCULOSKELETAL: Normal ROM of all extremities,Right hip: tender to palpation, but FROM. Lumbar back on right tenderness with spasm no midline tenderness. SLR's negative.   SKIN: warm and dry without rash PSYCHIATRIC: Mood and affect, behavior are normal  Urgent Care Course   Clinical Course    Procedures (including critical care time)  Labs Review Labs Reviewed - No data to display  Imaging Review Dg Hip Unilat With Pelvis 2-3 Views Right  Result Date: 09/09/2016 CLINICAL DATA:  Larey Seat playing yesterday injuring the right hip EXAM: DG HIP (WITH OR WITHOUT PELVIS) 2-3V RIGHT COMPARISON:  None. FINDINGS: Both hips appear normal with no acute abnormality. The pelvic rami are intact. The SI joints are corticated. IMPRESSION: Negative. Electronically Signed   By: Dwyane Dee M.D.   On: 09/09/2016 14:15    Discussed with patient prior to discharge Visual Acuity Review  Right Eye Distance:   Left Eye Distance:   Bilateral Distance:    Right Eye Near:   Left Eye Near:    Bilateral  Near:         MDM   1. Fall     Patient is reassured that there are no issues that require transfer to higher level of care at this time or additional tests. Patient is advised to continue home symptomatic treatment. Patient is advised that if there are new or worsening symptoms to attend the emergency department, contact primary care provider, or return to UC. Instructions of care provided discharged home in stable condition.    THIS NOTE WAS GENERATED USING A VOICE RECOGNITION SOFTWARE PROGRAM. ALL REASONABLE EFFORTS  WERE MADE TO PROOFREAD THIS DOCUMENT FOR ACCURACY.  I have  verbally reviewed the discharge instructions with the patient. A printed AVS was given to the patient.  All questions were answered prior to discharge.      Tharon Aquas, PA 09/09/16 1424

## 2017-07-23 ENCOUNTER — Encounter (HOSPITAL_COMMUNITY): Payer: Self-pay | Admitting: Emergency Medicine

## 2017-07-23 ENCOUNTER — Ambulatory Visit (HOSPITAL_COMMUNITY)
Admission: EM | Admit: 2017-07-23 | Discharge: 2017-07-23 | Disposition: A | Discharge status: Home / Self Care | Payer: 59 | Attending: Family Medicine | Admitting: Family Medicine

## 2017-07-23 DIAGNOSIS — H6123 Impacted cerumen, bilateral: Secondary | ICD-10-CM

## 2017-07-23 DIAGNOSIS — J069 Acute upper respiratory infection, unspecified: Secondary | ICD-10-CM

## 2017-07-23 MED ORDER — IPRATROPIUM BROMIDE 0.06 % NA SOLN: 2.0000 | Freq: Four times a day (QID) | NASAL | 0 refills | Status: DC

## 2017-07-23 MED ORDER — BENZONATATE 100 MG PO CAPS: 100.0000 mg | ORAL_CAPSULE | Freq: Three times a day (TID) | ORAL | 0 refills | Status: DC

## 2017-07-23 MED ORDER — AZITHROMYCIN 250 MG PO TABS: 250.0000 mg | ORAL_TABLET | Freq: Every day | ORAL | 0 refills | Status: DC

## 2017-07-23 NOTE — Discharge Instructions (Signed)
You most likely have a viral URI, this type of infection will not be helped by antibiotics.  For your symptoms I have prescribed Tessalon for cough and ipratropium nasal spray for congestion and runny nose  I have given a prescription for a delay fill antibiotic. Please do not fill for three days, as if you have a viral infection this medicine will not help. Only fill if you continue to have symptoms past three days.   In addition to these therapies, I advise rest, plenty of fluids and management of symptoms with over the counter medicines. Over the counter therapies for your symptoms include:Tylenol as needed every 4-6 hours for body aches or fever, not to exceed 4,000 mg a day, Take mucinex or mucinex DM ever 12 hours with a full glass of water, you may use an inhaled steroid such as Flonase, 2 sprays each nostril once a day for congestion, or an antihistamine such as Claritin or Zyrtec once a day. Another alternative for congestion, is a pseudoephedrine containing product available from the pharmacist. Should your symptoms worsen or fail to resolve, follow up with your primary care provider or return to clinic.

## 2017-07-23 NOTE — ED Provider Notes (Signed)
  [facility]   [id number] 07/23/17 Arrival Time: 1717   SUBJECTIVE:  Patricia Lin is a 36 y.o. female who presents to the urgent care  with complaint of sinus congestion, runny nose, sore throat, fatigue, and fever for 5 days. Along with ear pain and pressure. And cough non-productive, dry, hacking, worse at night. She does not smoke, no history of asthma. No nausea, vomiting, diarrhea, abdominal pain, dysuria or frequency, or other significant symptoms. 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 Remainder of history is negative  ROS: As per HPI, remainder of ROS negative.   OBJECTIVE:  Vitals:   07/23/17 1745 07/23/17 1746  BP: 124/90   Pulse: 75   Resp: 16   Temp: 98.5 F (36.9 C)   TempSrc: Oral   SpO2: 100%   Weight:  180 lb (81.6 kg)  Height:  5\' 2"  (1.575 m)     General appearance: alert; no distress HEENT: normocephalic; atraumatic; conjunctivae normal; No oropharyngeal edema, erythema, or tonsillar exudate, tonsils +3, bilateral cerumen impaction, no preauricular or postauricular, submandibular, or tonsillar lymphadenopathy,  Neck: There is cervical lymphadenopathy, trachea is midline, no JVD Lungs: clear to auscultation bilaterally Heart: regular rate and rhythm Abdomen: soft, non-tender; bowel sounds normal; no masses or organomegaly; no guarding or rebound tenderness Musculoskeletal/extremities: Pulses +2 and symmetrical, no dependent edema noted  Skin: warm and dry Neurologic: Grossly normal Psychological:  alert and cooperative; normal mood and affect     ASSESSMENT & PLAN:  1. Viral upper respiratory tract infection     Meds ordered this encounter  Medications  . benzonatate (TESSALON) 100 MG capsule    Sig: Take 1 capsule (100 mg total) by mouth every 8 (eight) hours.    Dispense:  21 capsule    Refill:  0    Order Specific Question:   Supervising Provider    Answer:   Mardella LaymanHAGLER, BRIAN [id number]]  . ipratropium (ATROVENT)  0.06 % nasal spray    Sig: Place 2 sprays into both nostrils 4 (four) times daily.    Dispense:  15 mL    Refill:  0    Order Specific Question:   Supervising Provider    Answer:   Mardella LaymanHAGLER, BRIAN [id number]]  . azithromycin (ZITHROMAX) 250 MG tablet    Sig: Take 1 tablet (250 mg total) by mouth daily. Take first 2 tablets together, then 1 every day until finished.    Dispense:  6 tablet    Refill:  0    Order Specific Question:   Supervising Provider    Answer:   Mardella LaymanHAGLER, BRIAN [[id number]]     Given a delay fill prescription for azithromycin, instructed not to feel for at least 3 days, and then only fill if she remains symptomatic. Explained that this is most likely viral upper respiratory infection, and antibiotics would not help anyway. Reviewed expectations re: course of current medical issues. Questions answered. Outlined signs and symptoms indicating need for more acute intervention. Patient verbalized understanding. After Visit Summary given.    Procedures:  Labs Reviewed - No data to display  No results found.  Allergies  Allergen Reactions  . Latex Itching and Rash    PMHx, SurgHx, SocialHx, Medications, and Allergies were reviewed in the Visit Navigator and updated as appropriate.       Dorena BodoKennard, Lawsyn Heiler, NP 07/23/17 [phone]

## 2017-07-23 NOTE — ED Triage Notes (Signed)
PT reports ear drainage, congestion, cough, sneeze, sore throat, body aches, and chills that started Saturday.

## 2017-12-04 ENCOUNTER — Encounter (HOSPITAL_COMMUNITY): Payer: Self-pay | Admitting: *Deleted

## 2017-12-04 ENCOUNTER — Observation Stay (HOSPITAL_COMMUNITY)
Admission: EM | Admit: 2017-12-04 | Discharge: 2017-12-06 | Disposition: A | Discharge status: Home / Self Care | Payer: 59 | Attending: Family Medicine | Admitting: Family Medicine

## 2017-12-04 ENCOUNTER — Other Ambulatory Visit: Payer: Self-pay

## 2017-12-04 DIAGNOSIS — Z79899 Other long term (current) drug therapy: Secondary | ICD-10-CM | POA: Insufficient documentation

## 2017-12-04 DIAGNOSIS — D509 Iron deficiency anemia, unspecified: Principal | ICD-10-CM | POA: Insufficient documentation

## 2017-12-04 DIAGNOSIS — E559 Vitamin D deficiency, unspecified: Secondary | ICD-10-CM | POA: Insufficient documentation

## 2017-12-04 DIAGNOSIS — Z87440 Personal history of urinary (tract) infections: Secondary | ICD-10-CM | POA: Diagnosis not present

## 2017-12-04 DIAGNOSIS — D649 Anemia, unspecified: Secondary | ICD-10-CM | POA: Diagnosis present

## 2017-12-04 DIAGNOSIS — Z7722 Contact with and (suspected) exposure to environmental tobacco smoke (acute) (chronic): Secondary | ICD-10-CM | POA: Insufficient documentation

## 2017-12-04 DIAGNOSIS — N920 Excessive and frequent menstruation with regular cycle: Secondary | ICD-10-CM | POA: Insufficient documentation

## 2017-12-04 LAB — BASIC METABOLIC PANEL
Anion gap: 10 (ref 5–15)
BUN: 10 mg/dL (ref 6–20)
CO2: 23 mmol/L (ref 22–32)
CREATININE: 0.6 mg/dL (ref 0.44–1.00)
Calcium: 9.2 mg/dL (ref 8.9–10.3)
Chloride: 104 mmol/L (ref 101–111)
GFR calc Af Amer: >60 mL/min (ref >60)
Glucose, Bld: 85 mg/dL (ref 65–99)
Potassium: 3.6 mmol/L (ref 3.5–5.1)
Sodium: 137 mmol/L (ref 135–145)

## 2017-12-04 LAB — CBC WITH DIFFERENTIAL/PLATELET
Basophils Absolute: 0 10*3/uL (ref 0.0–0.1)
Basophils Relative: 0 %
EOS PCT: 1 %
Eosinophils Absolute: 0.1 10*3/uL (ref 0.0–0.7)
HEMATOCRIT: 23.7 % — AB (ref 36.0–46.0)
Hemoglobin: 6.4 g/dL — CL (ref 12.0–15.0)
LYMPHS ABS: 1.9 10*3/uL (ref 0.7–4.0)
Lymphocytes Relative: 35 %
MCH: 16.7 pg — ABNORMAL LOW (ref 26.0–34.0)
MCHC: 27 g/dL — AB (ref 30.0–36.0)
MCV: 61.9 fL — AB (ref 78.0–100.0)
MONOS PCT: 6 %
Monocytes Absolute: 0.3 10*3/uL (ref 0.1–1.0)
NEUTROS ABS: 3.1 10*3/uL (ref 1.7–7.7)
Neutrophils Relative %: 58 %
Platelets: 290 10*3/uL (ref 150–400)
RBC: 3.83 MIL/uL — ABNORMAL LOW (ref 3.87–5.11)
RDW: 20.8 % — ABNORMAL HIGH (ref 11.5–15.5)
WBC: 5.4 10*3/uL (ref 4.0–10.5)

## 2017-12-04 LAB — RETICULOCYTES
RBC.: 3.72 MIL/uL — ABNORMAL LOW (ref 3.87–5.11)
RETIC CT PCT: 0.9 % (ref 0.4–3.1)
Retic Count, Absolute: 33.5 10*3/uL (ref 19.0–186.0)

## 2017-12-04 LAB — I-STAT BETA HCG BLOOD, ED (MC, WL, AP ONLY): I-stat hCG, quantitative: <5 m[IU]/mL (ref <5)

## 2017-12-04 LAB — PREPARE RBC (CROSSMATCH)

## 2017-12-04 MED ORDER — SODIUM CHLORIDE 0.9 % IV SOLN
Freq: Once | INTRAVENOUS | Status: AC
  Administered 2017-12-05: via INTRAVENOUS

## 2017-12-04 NOTE — ED Provider Notes (Signed)
[facility]   CSN: [id number] Arrival date & time: 12/04/17  1806     History   Chief Complaint Chief Complaint  Patient presents with  . needs blood transfusion    HPI Junius Finnerlyssa Bodie is a 36 y.o. female.  Arlyss Represslyssa Waterfield is a 36 y.o. Female who presents to the emergency department complaining of symptomatic anemia today.  Patient reports for several months she has been feeling short of breath with exertion and having lightheadedness with position change.  She was seen by her PCP for blood work and it was discovered her hemoglobin was very low.  She was directed to the emergency department.  Patient reports she is been very fatigued recently.  She was previously on oral iron but has discontinued this.  She reports her last menstrual cycle ended about 2 weeks ago.  She is not having current vaginal bleeding.  She denies fevers, chest pain, abdominal pain, nausea, vomiting, hematemesis, hematochezia, melena, vaginal bleeding, urinary symptoms or rashes.   The history is provided by the patient and medical records. No language interpreter was used.    Past Medical History:  Diagnosis Date  . Abnormal Pap smear   . Anemia   . BV (bacterial vaginosis) 2011  . Chlamydia infection   . CIN I (cervical intraepithelial neoplasia I) 2008  . Dysrhythmia 2009   TACHYCARDIA;   HAD W/U 2010? POSSIBLE HEART VALVE ISSUE; NOT F/U NEEDED X 3 YEARS PER PT  . Dysuria 2010  . H/O pre-eclampsia in prior pregnancy, currently pregnant   . H/O varicella   . H/O vitamin D deficiency   . Headache(784.0)    MIGRAINES  . Hx of breast reduction, elective   . Hx: UTI (urinary tract infection)   . Infection    UTI DURING PREGNANCY  . Infection    YEAST WITH PREGNANCY  . Infection    CHLAMYDIA X 1  . 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 Liver disease 2012   UNKOWN DIAGNOSIS  . Low iron   . PIH  (pregnancy induced hypertension) 11-2012   Today pp one month.  B/P nl, and 122/76 on repeat-sitting  . Postpartum depression 2011   NO MEDS  . Pregnancy induced hypertension    ALL PREGNANCIES  . Shortness of breath    WITH EXERTION SICNE PREGNANCY  . Tachycardia   . Tachycardia 2010   HAS HAD WORKUP. UNSURE IF HAS POSSIBLE HEART VALVE ISSUE  . Vulvitis 2009    Patient Active Problem List   Diagnosis Date Noted  . Anemia 12/05/2017  . S/P tubal ligation 02/09/2013  . Vaginal delivery 01/09/2013  . 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  . Fracture of left ulna 09/27/12 10/23/2012  . 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 Symptomatic anemia 09/03/2012  . H/O pre-eclampsia in prior pregnancy, currently pregnant 08/19/2012  . Chronic hypertension in pregnancy 08/19/2012  . Hx LEEP (loop electrosurgical excision procedure), cervix, pregnancy 08/19/2012  . Hx of postpartum depression, currently pregnant 08/19/2012  . Late prenatal care 08/19/2012  . Hx of precipitous labor and deliveries, antepartum 08/19/2012  . Muscle tension HAs 08/19/2012    Past Surgical History:  Procedure Laterality Date  . BREAST REDUCTION SURGERY  2008  . BREAST SURGERY    . DILATION AND CURETTAGE OF UTERUS    . FINGER SURGERY     Right index finger  . TUBAL LIGATION  01/09/2013   Procedure: POST PARTUM TUBAL LIGATION;  Surgeon: Michael LitterNaima A Dillard, MD;  Location: [facility];  Service: Gynecology;  Laterality: Bilateral;  post partum tubal ligation bilateral  . US ECHOCARDIOGRAPHY  06/18/2009   ef 55-60%  . WISDOM TOOTH EXTRACTION      OB History    Gravida Para Term Preterm AB Living   7 4 4   3 4    SAB TAB Ectopic Multiple Live Births   1 2     4       Obstetric Comments   2011 PRECLAMPSIA PP; IN ICU X 3 DAYS       Home Medications    Prior to Admission medications   Medication Sig Start Date End Date Taking? Authorizing Provider  ibuprofen (ADVIL,MOTRIN) 600 MG tablet Take 1  tablet (600 mg total) by mouth every 6 (six) hours. 01/10/13  Yes Rivard, Dois DavenportSandra, MD  naproxen (NAPROSYN) 250 MG tablet Take 250 mg by mouth 2 (two) times daily. 11/12/17  Yes [provider]  azithromycin (ZITHROMAX) 250 MG tablet Take 1 tablet (250 mg total) by mouth daily. Take first 2 tablets together, then 1 every day until finished. Patient not taking: Reported on 12/04/2017 07/23/17   Dorena BodoKennard, Lawrence, NP  benzonatate (TESSALON) 100 MG capsule Take 1 capsule (100 mg total) by mouth every 8 (eight) hours. Patient not taking: Reported on 12/04/2017 07/23/17   Dorena BodoKennard, Lawrence, NP  cyclobenzaprine (FLEXERIL) 10 MG tablet Take 1 tablet (10 mg total) by mouth 2 (two) times daily as needed for muscle spasms. Patient not taking: Reported on 12/04/2017 09/09/16   Tharon AquasPatrick, Frank C, PA  ferrous sulfate 325 (65 FE) MG tablet Take 1 tablet (325 mg total) by mouth once. Patient not taking: Reported on 12/16/2014 02/17/13   Elane Fritzarter, Lakasha, FNP  HYDROcodone-acetaminophen (NORCO/VICODIN) 5-325 MG per tablet Take 2 tablets by mouth every 4 (four) hours as needed for moderate pain or severe pain. Patient not taking: Reported on 12/04/2017 12/16/14   Oswaldo Conroyreech, Victoria, PA-C  ipratropium (ATROVENT) 0.06 % nasal spray Place 2 sprays into both nostrils 4 (four) times daily. Patient not taking: Reported on 12/04/2017 07/23/17   Dorena BodoKennard, Lawrence, NP  NIFEdipine (PROCARDIA XL) 30 MG 24 hr tablet Take 1 tablet (30 mg total) by mouth daily. Patient not taking: Reported on 12/16/2014 02/16/13   Elane Fritzarter, Lakasha, FNP  oxyCODONE-acetaminophen (PERCOCET/ROXICET) 5-325 MG per tablet Take 1-2 tablets by mouth every 4 (four) hours as needed (moderate - severe pain). Patient not taking: Reported on 12/16/2014 01/10/13   Silverio Layivard, Sandra, MD    Family History Family History  Problem Relation Age of Onset  . Hypertension Mother   . Hyperlipidemia Mother   . Coronary artery disease Mother   . Heart disease Mother        high  cholesterol  . Asthma Mother   . Diabetes Mother   . Hypertension Father   . Hypertension Maternal Grandmother   . Hypertension Maternal Grandfather   . Asthma Daughter     Social History Social History   Tobacco Use  . Smoking status: Passive Smoke Exposure - Never Smoker  . Smokeless tobacco: Never Used  Substance Use Topics  . Alcohol use: Yes    Comment: WEEKENDS -LIQUOR;  LAST DRANK 04/03/12  . Drug use: No     Allergies   Latex   Review of Systems Review of Systems  Constitutional: Positive for fatigue. Negative for chills and fever.  HENT: Negative for congestion and sore throat.  Eyes: Negative for visual disturbance.  Respiratory: Positive for shortness of breath. Negative for cough and wheezing.   Cardiovascular: Negative for chest pain and palpitations.  Gastrointestinal: Negative for abdominal pain, diarrhea, nausea and vomiting.  Genitourinary: Negative for dysuria, vaginal bleeding and vaginal discharge.  Musculoskeletal: Negative for back pain and neck pain.  Skin: Negative for rash.  Neurological: Positive for light-headedness. Negative for syncope and headaches.     Physical Exam Updated Vital Signs BP 126/66   Pulse 72   Temp 98.2 F (36.8 C) (Oral)   Resp 18   SpO2 97%   Physical Exam  Constitutional: She appears well-developed and well-nourished. No distress.  Nontoxic appearing.  HENT:  Head: Normocephalic and atraumatic.  Mouth/Throat: Oropharynx is clear and moist.  Eyes: Conjunctivae are normal. Pupils are equal, round, and reactive to light. Right eye exhibits no discharge. Left eye exhibits no discharge.  Neck: Neck supple.  Cardiovascular: Normal rate, regular rhythm, normal heart sounds and intact distal pulses. Exam reveals no gallop and no friction rub.  No murmur heard. Pulmonary/Chest: Effort normal and breath sounds normal. No respiratory distress. She has no wheezes. She has no rales.  Abdominal: Soft. There is no  tenderness. There is no guarding.  Musculoskeletal: She exhibits no edema.  Lymphadenopathy:    She has no cervical adenopathy.  Neurological: She is alert. Coordination normal.  Skin: Skin is warm and dry. No rash noted. She is not diaphoretic. No erythema. No pallor.  Psychiatric: She has a normal mood and affect. Her behavior is normal.  Nursing note and vitals reviewed.    ED Treatments / Results  Labs (all labs ordered are listed, but only abnormal results are displayed) Labs Reviewed  CBC WITH DIFFERENTIAL/PLATELET - Abnormal; Notable for the following components:      Result Value   RBC 3.83 (*)    Hemoglobin 6.4 (*)    HCT 23.7 (*)    MCV 61.9 (*)    MCH 16.7 (*)    MCHC 27.0 (*)    RDW 20.8 (*)    All other components within normal limits  IRON AND TIBC - Abnormal; Notable for the following components:   Iron 14 (*)    Saturation Ratios 3 (*)    All other components within normal limits  FERRITIN - Abnormal; Notable for the following components:   Ferritin 2 (*)    All other components within normal limits  RETICULOCYTES - Abnormal; Notable for the following components:   RBC. 3.72 (*)    All other components within normal limits  BASIC METABOLIC PANEL  VITAMIN B12  FOLATE  HIV ANTIBODY (ROUTINE TESTING)  BASIC METABOLIC PANEL  CBC  I-STAT BETA HCG BLOOD, ED ([facility], [facility], [facility] ONLY)  TYPE AND SCREEN  PREPARE RBC (CROSSMATCH)    EKG  EKG Interpretation None       Radiology No results found.  Procedures Procedures (including critical care time)  CRITICAL CARE Performed by: Lawana Chambers   Total critical care time: 35  minutes  Critical care time was exclusive of separately billable procedures and treating other patients.  Critical care was necessary to treat or prevent imminent or life-threatening deterioration.  Critical care was time spent personally by me on the following activities: development of treatment plan with patient and/or  surrogate as well as nursing, discussions with consultants, evaluation of patient's response to treatment, examination of patient, obtaining history from patient or surrogate, ordering and performing treatments and interventions, ordering and review  of laboratory studies, ordering and review of radiographic studies, pulse oximetry and re-evaluation of patient's condition.   Medications Ordered in ED Medications  acetaminophen (TYLENOL) tablet 650 mg (not administered)    Or  acetaminophen (TYLENOL) suppository 650 mg (not administered)  ondansetron (ZOFRAN) tablet 4 mg (not administered)    Or  ondansetron (ZOFRAN) injection 4 mg (not administered)  0.9 %  sodium chloride infusion ( Intravenous New Bag/Given 12/05/17 0019)     Initial Impression / Assessment and Plan / ED Course  I have reviewed the triage vital signs and the nursing notes.  Pertinent labs & imaging results that were available during my care of the patient were reviewed by me and considered in my medical decision making (see chart for details).    This is a 36 y.o. Female who presents to the emergency department complaining of symptomatic anemia today.  Patient reports for several months she has been feeling short of breath with exertion and having lightheadedness with position change.  She was seen by her PCP for blood work and it was discovered her hemoglobin was very low.  She was directed to the emergency department.  Patient reports she is been very fatigued recently.  She was previously on oral iron but has discontinued this.  She reports her last menstrual cycle ended about 2 weeks ago.  She is not having current vaginal bleeding.   On exam the patient is afebrile nontoxic-appearing.  Her abdomen is soft and nontender to palpation.  Lungs clear to auscultation.  She denies feeling short of breath at rest.  No tachypnea, tachycardia or hypoxia.  CBC reveals hemoglobin of 6.4.  Patient is symptomatic.  Will plan for  blood transfusion and admission for possible IV iron.  Anemia panel ordered.  I discussed plan with blood transfusion with the patient.  We discussed risk and benefits.  She agrees with plan for blood transfusion and admission.  I consulted with Dr. Toniann Fail who accepted the patient for admission.  This patient was discussed with Dr. Erma Heritage who agrees with assessment and plan.   Final Clinical Impressions(s) / ED Diagnoses   Final diagnoses:  Symptomatic anemia    ED Discharge Orders    None       Everlene Farrier, PA-C 12/05/17 [id number]    Shaune Pollack, MD 12/05/17 1153

## 2017-12-04 NOTE — ED Triage Notes (Signed)
Sent to ED by PCP for blood transfusion. States she went for her yearly exam and told her hgb was 'dangerously low'. Pt states she always has a heavy period. No cp. Does have sob and is 'always tired'.

## 2017-12-04 NOTE — ED Notes (Signed)
Nurse currently starting IV and drawing labs. 

## 2017-12-05 ENCOUNTER — Encounter (HOSPITAL_COMMUNITY): Payer: Self-pay | Admitting: Internal Medicine

## 2017-12-05 DIAGNOSIS — D649 Anemia, unspecified: Secondary | ICD-10-CM | POA: Diagnosis present

## 2017-12-05 LAB — BASIC METABOLIC PANEL WITH GFR
Anion gap: 5 (ref 5–15)
BUN: 11 mg/dL (ref 6–20)
CO2: 23 mmol/L (ref 22–32)
Calcium: 8.8 mg/dL — ABNORMAL LOW (ref 8.9–10.3)
Chloride: 110 mmol/L (ref 101–111)
Creatinine, Ser: 0.69 mg/dL (ref 0.44–1.00)
GFR calc Af Amer: >60 mL/min
GFR calc non Af Amer: >60 mL/min
Glucose, Bld: 95 mg/dL (ref 65–99)
Potassium: 3.7 mmol/L (ref 3.5–5.1)
Sodium: 138 mmol/L (ref 135–145)

## 2017-12-05 LAB — CBC
HCT: 25.8 % — ABNORMAL LOW (ref 36.0–46.0)
HEMOGLOBIN: 7 g/dL — AB (ref 12.0–15.0)
MCH: 17.5 pg — ABNORMAL LOW (ref 26.0–34.0)
MCHC: 27.1 g/dL — ABNORMAL LOW (ref 30.0–36.0)
MCV: 64.7 fL — ABNORMAL LOW (ref 78.0–100.0)
Platelets: 265 10*3/uL (ref 150–400)
RBC: 3.99 MIL/uL (ref 3.87–5.11)
RDW: 23.6 % — ABNORMAL HIGH (ref 11.5–15.5)
WBC: 4.9 10*3/uL (ref 4.0–10.5)

## 2017-12-05 LAB — FOLATE: FOLATE: 9.2 ng/mL (ref >5.9)

## 2017-12-05 LAB — PREPARE RBC (CROSSMATCH)

## 2017-12-05 LAB — IRON AND TIBC
IRON: 14 ug/dL — AB (ref 28–170)
SATURATION RATIOS: 3 % — AB (ref 10.4–31.8)
TIBC: 417 ug/dL (ref 250–450)
UIBC: 403 ug/dL

## 2017-12-05 LAB — FERRITIN: Ferritin: 2 ng/mL — ABNORMAL LOW (ref 11–307)

## 2017-12-05 LAB — VITAMIN B12: VITAMIN B 12: 629 pg/mL (ref 180–914)

## 2017-12-05 LAB — HIV ANTIBODY (ROUTINE TESTING W REFLEX): HIV Screen 4th Generation wRfx: NONREACTIVE

## 2017-12-05 MED ORDER — ONDANSETRON HCL 4 MG/2ML IJ SOLN: 4.0000 mg | Freq: Four times a day (QID) | INTRAMUSCULAR | Status: DC | PRN

## 2017-12-05 MED ORDER — ACETAMINOPHEN 325 MG PO TABS: 650.0000 mg | ORAL_TABLET | Freq: Four times a day (QID) | ORAL | Status: DC | PRN

## 2017-12-05 MED ORDER — ACETAMINOPHEN 650 MG RE SUPP: 650.0000 mg | Freq: Four times a day (QID) | RECTAL | Status: DC | PRN

## 2017-12-05 MED ORDER — FERROUS SULFATE 325 (65 FE) MG PO TABS
325.0000 mg | ORAL_TABLET | Freq: Three times a day (TID) | ORAL | Status: DC
  Administered 2017-12-05 – 2017-12-06 (×4): 325 mg via ORAL
  Filled 2017-12-05 (×4): qty 1

## 2017-12-05 MED ORDER — SENNA 8.6 MG PO TABS
1.0000 | ORAL_TABLET | Freq: Every day | ORAL | Status: DC | PRN
  Administered 2017-12-05 – 2017-12-06 (×2): 8.6 mg via ORAL
  Filled 2017-12-05 (×2): qty 1

## 2017-12-05 MED ORDER — ONDANSETRON HCL 4 MG PO TABS: 4.0000 mg | ORAL_TABLET | Freq: Four times a day (QID) | ORAL | Status: DC | PRN

## 2017-12-05 MED ORDER — SODIUM CHLORIDE 0.9 % IV SOLN
Freq: Once | INTRAVENOUS | Status: AC
  Administered 2017-12-05: 12:00:00 via INTRAVENOUS

## 2017-12-05 NOTE — H&P (Signed)
History and Physical    Patricia Lin ZOX:[id number] DOB: [birth year] DOA: 12/04/2017  PCP: Dorothyann Peng, MD  Patient coming from: Home.  Chief Complaint: Low hemoglobin.  HPI: Patricia Lin is a 36 y.o. female with history of chronic headaches and menorrhagia presents to the ER after patient's primary care referred to the ER for low hemoglobin.  Patient states she had a regular checkup with PCP when labs were done and was found to have low hemoglobin and was referred to the ER.  Patient states she has had a history of anemia due to iron deficiency and has been having menorrhagia.  Last menstrual cycle was 2 weeks ago.  Denies any blood in the stools.  Does take NSAIDs for headaches which happens almost every day.  Denies any focal deficits.  Patient states over the last few weeks patient has been having increasing fatigue and exertional dyspnea.  Denies any fever chills productive cough chest pain.  ED Course: In the ER hemoglobin was confirmed to be around 6.4.  Ferritin level is around 2.  1 unit of PRBC has been ordered and admitted for further observation.  Review of Systems: As per HPI, rest all negative.   Past Medical History:  Diagnosis Date  . Abnormal Pap smear   . Anemia   . BV (bacterial vaginosis) 2011  . Chlamydia infection   . CIN I (cervical intraepithelial neoplasia I) 2008  . Dysrhythmia 2009   TACHYCARDIA;   HAD W/U 2010? POSSIBLE HEART VALVE ISSUE; NOT F/U NEEDED X 3 YEARS PER PT  . Dysuria 2010  . H/O pre-eclampsia in prior pregnancy, currently pregnant   . H/O varicella   . H/O vitamin D deficiency   . Headache(784.0)    MIGRAINES  . Hx of breast reduction, elective   . Hx: UTI (urinary tract infection)   . Infection    UTI DURING PREGNANCY  . Infection    YEAST WITH PREGNANCY  . Infection    CHLAMYDIA X 1  . 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 Liver disease 2012   UNKOWN DIAGNOSIS  . Low  iron   . PIH (pregnancy induced hypertension) 11-2012   Today pp one month.  B/P nl, and 122/76 on repeat-sitting  . Postpartum depression 2011   NO MEDS  . Pregnancy induced hypertension    ALL PREGNANCIES  . Shortness of breath    WITH EXERTION SICNE PREGNANCY  . Tachycardia   . Tachycardia 2010   HAS HAD WORKUP. UNSURE IF HAS POSSIBLE HEART VALVE ISSUE  . Vulvitis 2009    Past Surgical History:  Procedure Laterality Date  . BREAST REDUCTION SURGERY  2008  . BREAST SURGERY    . DILATION AND CURETTAGE OF UTERUS    . FINGER SURGERY     Right index finger  . TUBAL LIGATION  01/09/2013   Procedure: POST PARTUM TUBAL LIGATION;  Surgeon: Michael Litter, MD;  Location: [facility];  Service: Gynecology;  Laterality: Bilateral;  post partum tubal ligation bilateral  . US ECHOCARDIOGRAPHY  06/18/2009   ef 55-60%  . WISDOM TOOTH EXTRACTION       reports that she is a non-smoker but has been exposed to tobacco smoke. she has never used smokeless tobacco. She reports that she drinks alcohol. She reports that she does not use drugs.  Allergies  Allergen Reactions  . Latex Itching and Rash    Family History  Problem Relation  Age of Onset  . Hypertension Mother   . Hyperlipidemia Mother   . Coronary artery disease Mother   . Heart disease Mother        high cholesterol  . Asthma Mother   . Diabetes Mother   . Hypertension Father   . Hypertension Maternal Grandmother   . Hypertension Maternal Grandfather   . Asthma Daughter     Prior to Admission medications   Medication Sig Start Date End Date Taking? Authorizing Provider  ibuprofen (ADVIL,MOTRIN) 600 MG tablet Take 1 tablet (600 mg total) by mouth every 6 (six) hours. 01/10/13  Yes Rivard, Dois DavenportSandra, MD  naproxen (NAPROSYN) 250 MG tablet Take 250 mg by mouth 2 (two) times daily. 11/12/17  Yes [provider]  azithromycin (ZITHROMAX) 250 MG tablet Take 1 tablet (250 mg total) by mouth daily. Take first 2 tablets together, then  1 every day until finished. Patient not taking: Reported on 12/04/2017 07/23/17   Dorena BodoKennard, Lawrence, NP  benzonatate (TESSALON) 100 MG capsule Take 1 capsule (100 mg total) by mouth every 8 (eight) hours. Patient not taking: Reported on 12/04/2017 07/23/17   Dorena BodoKennard, Lawrence, NP  cyclobenzaprine (FLEXERIL) 10 MG tablet Take 1 tablet (10 mg total) by mouth 2 (two) times daily as needed for muscle spasms. Patient not taking: Reported on 12/04/2017 09/09/16   Tharon AquasPatrick, Frank C, PA  ferrous sulfate 325 (65 FE) MG tablet Take 1 tablet (325 mg total) by mouth once. Patient not taking: Reported on 12/16/2014 02/17/13   Elane Fritzarter, Lakasha, FNP  HYDROcodone-acetaminophen (NORCO/VICODIN) 5-325 MG per tablet Take 2 tablets by mouth every 4 (four) hours as needed for moderate pain or severe pain. Patient not taking: Reported on 12/04/2017 12/16/14   Oswaldo Conroyreech, Victoria, PA-C  ipratropium (ATROVENT) 0.06 % nasal spray Place 2 sprays into both nostrils 4 (four) times daily. Patient not taking: Reported on 12/04/2017 07/23/17   Dorena BodoKennard, Lawrence, NP  NIFEdipine (PROCARDIA XL) 30 MG 24 hr tablet Take 1 tablet (30 mg total) by mouth daily. Patient not taking: Reported on 12/16/2014 02/16/13   Elane Fritzarter, Lakasha, FNP  oxyCODONE-acetaminophen (PERCOCET/ROXICET) 5-325 MG per tablet Take 1-2 tablets by mouth every 4 (four) hours as needed (moderate - severe pain). Patient not taking: Reported on 12/16/2014 01/10/13   Silverio Layivard, Sandra, MD    Physical Exam: Vitals:   12/04/17 2230 12/04/17 2245 12/05/17 0014 12/05/17 0015  BP: 128/83 130/78 136/83 125/67  Pulse: 76 74 71 72  Resp:   18   Temp:   98.3 F (36.8 C) 98.3 F (36.8 C)  TempSrc:   Oral   SpO2: 100% 100% 100% 100%      Constitutional: Moderately built and nourished. Vitals:   12/04/17 2230 12/04/17 2245 12/05/17 0014 12/05/17 0015  BP: 128/83 130/78 136/83 125/67  Pulse: 76 74 71 72  Resp:   18   Temp:   98.3 F (36.8 C) 98.3 F (36.8 C)  TempSrc:   Oral   SpO2:  100% 100% 100% 100%   Eyes: Anicteric mild pallor. ENMT: No discharge from the ears eyes nose or mouth. Neck: No mass felt.  No neck rigidity.  No JVD appreciated. Respiratory: No rhonchi or crepitations. Cardiovascular: S1-S2 heard no murmurs appreciated. Abdomen: Soft nontender bowel sounds present. Musculoskeletal: No edema.  No joint effusion. Skin: No rash.  Skin appears warm. Neurologic: Alert awake oriented to time place and person.  Moves all extremities. Psychiatric: Appears normal.  Normal affect.   Labs on Admission: I have personally  reviewed following labs and imaging studies  CBC: Recent Labs  Lab 12/04/17 1858  WBC 5.4  NEUTROABS 3.1  HGB 6.4*  HCT 23.7*  MCV 61.9*  PLT 290   Basic Metabolic Panel: Recent Labs  Lab 12/04/17 2216  NA 137  K 3.6  CL 104  CO2 23  GLUCOSE 85  BUN 10  CREATININE 0.60  CALCIUM 9.2   GFR: CrCl cannot be calculated (Unknown ideal weight.). Liver Function Tests: No results for input(s): AST, ALT, ALKPHOS, BILITOT, PROT, ALBUMIN in the last 168 hours. No results for input(s): LIPASE, AMYLASE in the last 168 hours. No results for input(s): AMMONIA in the last 168 hours. Coagulation Profile: No results for input(s): INR, PROTIME in the last 168 hours. Cardiac Enzymes: No results for input(s): CKTOTAL, CKMB, CKMBINDEX, TROPONINI in the last 168 hours. BNP (last 3 results) No results for input(s): PROBNP in the last 8760 hours. HbA1C: No results for input(s): HGBA1C in the last 72 hours. CBG: No results for input(s): GLUCAP in the last 168 hours. Lipid Profile: No results for input(s): CHOL, HDL, LDLCALC, TRIG, CHOLHDL, LDLDIRECT in the last 72 hours. Thyroid Function Tests: No results for input(s): TSH, T4TOTAL, FREET4, T3FREE, THYROIDAB in the last 72 hours. Anemia Panel: Recent Labs    12/04/17 2243  RETICCTPCT 0.9   Urine analysis:    Component Value Date/Time   COLORURINE YELLOW 12/16/2014 1120    APPEARANCEUR CLOUDY (A) 12/16/2014 1120   LABSPEC 1.028 12/16/2014 1120   PHURINE 6.5 12/16/2014 1120   GLUCOSEU NEGATIVE 12/16/2014 1120   HGBUR NEGATIVE 12/16/2014 1120   BILIRUBINUR NEGATIVE 12/16/2014 1120   BILIRUBINUR neg 09/03/2012 1441   KETONESUR NEGATIVE 12/16/2014 1120   PROTEINUR 30 (A) 12/16/2014 1120   UROBILINOGEN 0.2 12/16/2014 1120   NITRITE NEGATIVE 12/16/2014 1120   LEUKOCYTESUR NEGATIVE 12/16/2014 1120   Sepsis Labs: @LABRCNTIP (procalcitonin:4,lacticidven:4) )No results found for this or any previous visit (from the past 240 hour(s)).   Radiological Exams on Admission: No results found.   Assessment/Plan Principal Problem:   Symptomatic anemia Active Problems:   Anemia    1. Symptomatic anemia -patient is receiving 1 unit of PRBC.  Follow CBC. 2. Iron deficiency anemia likely from menorrhagia -will need iron supplements at discharge. 3. Menorrhagia patient advised that patient will need to OB/GYN follow-up for further workup on this. 4. Chronic headaches may be related to anemia.  Denies any family history of migraines.  Patient is nonfocal.  Presently headache free.   DVT prophylaxis: SCDs. Code Status: Full code. Family Communication: Discussed with patient. Disposition Plan: Home. Consults called: None. Admission status: Observation.   Eduard ClosArshad N Chevie Birkhead MD [facility] Pager [phone].  If 7PM-7AM, please contact night-coverage [email or web address] Password [email or web address]  12/05/2017, 12:26 AM

## 2017-12-05 NOTE — Progress Notes (Signed)
Patient seen and evaluated earlier the same by my associate. Please refer to H&P for details regarding history, assessment, and plan.  We'll plan on transfusing and orders placed given hemoglobin of 7.0. Patient was complaining of symptoms consistent with symptomatic anemia. Discussed with patient was agreeable for transfusion. We'll add iron supplementation  Reassess CBC  Gen.: Patient in no acute distress, alert and awake Cardiovascular: No cyanosis Pulmonary: No increased work of breathing, no audible wheezes  Will reassess next am  AdjuntasVEGA, Pamala HurryLANDO

## 2017-12-06 DIAGNOSIS — D649 Anemia, unspecified: Secondary | ICD-10-CM | POA: Diagnosis not present

## 2017-12-06 LAB — CBC
HEMATOCRIT: 30.2 % — AB (ref 36.0–46.0)
HEMOGLOBIN: 8.9 g/dL — AB (ref 12.0–15.0)
MCH: 19.7 pg — AB (ref 26.0–34.0)
MCHC: 29.5 g/dL — AB (ref 30.0–36.0)
MCV: 66.8 fL — ABNORMAL LOW (ref 78.0–100.0)
Platelets: 265 10*3/uL (ref 150–400)
RBC: 4.52 MIL/uL (ref 3.87–5.11)
RDW: 24.8 % — AB (ref 11.5–15.5)
WBC: 6 10*3/uL (ref 4.0–10.5)

## 2017-12-06 LAB — TYPE AND SCREEN
ABO/RH(D): A POS
Antibody Screen: NEGATIVE
UNIT DIVISION: 0
Unit division: 0

## 2017-12-06 LAB — BPAM RBC
BLOOD PRODUCT EXPIRATION DATE: 201901232359
Blood Product Expiration Date: 201901252359
ISSUE DATE / TIME: 201812291131
ISSUE DATE / TIME: 201812282350
UNIT TYPE AND RH: 6200
UNIT TYPE AND RH: 6200

## 2017-12-06 MED ORDER — FERROUS SULFATE 325 (65 FE) MG PO TABS: 325.0000 mg | ORAL_TABLET | Freq: Three times a day (TID) | ORAL | 0 refills | Status: DC

## 2017-12-06 NOTE — Plan of Care (Signed)
  Clinical Measurements: Diagnostic test results will improve 12/06/2017 1011 - Progressing by Darrow BussingArcilla, Deshante Cassell M, RN   Activity: Risk for activity intolerance will decrease 12/06/2017 1011 - Progressing by Darrow BussingArcilla, Jorden Mahl M, RN   Nutrition: Adequate nutrition will be maintained 12/06/2017 1011 - Progressing by Darrow BussingArcilla, Raneshia Derick M, RN   Elimination: Will not experience complications related to bowel motility 12/06/2017 1011 - Progressing by Darrow BussingArcilla, Faizon Capozzi M, RN

## 2017-12-06 NOTE — Progress Notes (Signed)
Removed IV, provided discharge education/instructions, all questions and concerns addressed, Pt not in distress, discharged home with belongings. 

## 2017-12-06 NOTE — Discharge Summary (Signed)
Physician Discharge Summary  Junius Finnerlyssa Basso JYN:[id number] DOB: 1981-08-16 DOA: 12/04/2017  PCP: Dorothyann PengSanders, Robyn, MD  Admit date: 12/04/2017 Discharge date: 12/06/2017  Time spent: > 35 minutes  Recommendations for Outpatient Follow-up:  1. Monitor hgb levels 2. Recommend Iron supplementation   Discharge Diagnoses:  Principal Problem:   Symptomatic anemia Active Problems:   Anemia   Discharge Condition: stable  Diet recommendation: regular diet  Filed Weights   12/05/17 0159  Weight: 84 kg (185 lb 3 oz)    History of present illness:  36 y.o. female with history of chronic headaches and menorrhagia presents to the ER after patient's primary care referred to the ER for low hemoglobin  Anemia symptoms resolved with transfusion  Hospital Course:  Anemia - secondary to menorrhagia and iron deficiency - symptoms resolved with transfusion - prescribe iron supplementation on discharge  Menorrhagia - recommended GYN follow up as outpatient.  Procedures:  None  Consultations:  None  Discharge Exam: Vitals:   12/05/17 2013 12/06/17 0617  BP: (!) 147/99 123/71  Pulse: 72 68  Resp: 18 18  Temp: 98 F (36.7 C) 97.9 F (36.6 C)  SpO2: 100% 99%    General: Pt in nad, alert and awake Cardiovascular: rrr, no rubs Respiratory: no increased wob, no wheezes  Discharge Instructions   Discharge Instructions    Call MD for:  severe uncontrolled pain   Complete by:  As directed    Call MD for:  temperature >100.4   Complete by:  As directed    Diet - low sodium heart healthy   Complete by:  As directed    Discharge instructions   Complete by:  As directed    Please take your iron supplementation as recommended. Follow up with a GYN specialist for your menorrhagia   Increase activity slowly   Complete by:  As directed      Allergies as of 12/06/2017      Reactions   Latex Itching, Rash      Medication List    STOP taking these medications   azithromycin  250 MG tablet Commonly known as:  ZITHROMAX   benzonatate 100 MG capsule Commonly known as:  TESSALON   HYDROcodone-acetaminophen 5-325 MG tablet Commonly known as:  NORCO/VICODIN   ibuprofen 600 MG tablet Commonly known as:  ADVIL,MOTRIN   naproxen 250 MG tablet Commonly known as:  NAPROSYN     TAKE these medications   cyclobenzaprine 10 MG tablet Commonly known as:  FLEXERIL Take 1 tablet (10 mg total) by mouth 2 (two) times daily as needed for muscle spasms.   ferrous sulfate 325 (65 FE) MG tablet Take 1 tablet (325 mg total) by mouth 3 (three) times daily with meals. What changed:  when to take this   ipratropium 0.06 % nasal spray Commonly known as:  ATROVENT Place 2 sprays into both nostrils 4 (four) times daily.   NIFEdipine 30 MG 24 hr tablet Commonly known as:  PROCARDIA XL Take 1 tablet (30 mg total) by mouth daily.   oxyCODONE-acetaminophen 5-325 MG tablet Commonly known as:  PERCOCET/ROXICET Take 1-2 tablets by mouth every 4 (four) hours as needed (moderate - severe pain).      Allergies  Allergen Reactions  . Latex Itching and Rash      The results of significant diagnostics from this hospitalization (including imaging, microbiology, ancillary and laboratory) are listed below for reference.    Significant Diagnostic Studies: No results found.  Microbiology: No results found for this  or any previous visit (from the past 240 hour(s)).   Labs: Basic Metabolic Panel: Recent Labs  Lab 12/04/17 2216 12/05/17 0747  NA 137 138  K 3.6 3.7  CL 104 110  CO2 23 23  GLUCOSE 85 95  BUN 10 11  CREATININE 0.60 0.69  CALCIUM 9.2 8.8*   Liver Function Tests: No results for input(s): AST, ALT, ALKPHOS, BILITOT, PROT, ALBUMIN in the last 168 hours. No results for input(s): LIPASE, AMYLASE in the last 168 hours. No results for input(s): AMMONIA in the last 168 hours. CBC: Recent Labs  Lab 12/04/17 1858 12/05/17 0747 12/06/17 0000  WBC 5.4 4.9 6.0   NEUTROABS 3.1  --   --   HGB 6.4* 7.0* 8.9*  HCT 23.7* 25.8* 30.2*  MCV 61.9* 64.7* 66.8*  PLT 290 265 265   Cardiac Enzymes: No results for input(s): CKTOTAL, CKMB, CKMBINDEX, TROPONINI in the last 168 hours. BNP: BNP (last 3 results) No results for input(s): BNP in the last 8760 hours.  ProBNP (last 3 results) No results for input(s): PROBNP in the last 8760 hours.  CBG: No results for input(s): GLUCAP in the last 168 hours.   Signed:  Penny PiaVEGA, Yocelin Vanlue MD.  [facility] 12/06/2017, 11:11 AM

## 2018-05-05 ENCOUNTER — Encounter (HOSPITAL_COMMUNITY): Payer: Self-pay | Admitting: Licensed Clinical Social Worker

## 2018-05-05 ENCOUNTER — Ambulatory Visit (INDEPENDENT_AMBULATORY_CARE_PROVIDER_SITE_OTHER): Payer: PRIVATE HEALTH INSURANCE | Admitting: Licensed Clinical Social Worker

## 2018-05-05 DIAGNOSIS — F332 Major depressive disorder, recurrent severe without psychotic features: Secondary | ICD-10-CM
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
## 2018-05-17 ENCOUNTER — Ambulatory Visit (HOSPITAL_COMMUNITY): Payer: 59 | Admitting: Psychiatry

## 2018-05-26 DIAGNOSIS — Z862 Personal history of diseases of the blood and blood-forming organs and certain disorders involving the immune mechanism: Secondary | ICD-10-CM | POA: Insufficient documentation

## 2018-05-31 ENCOUNTER — Other Ambulatory Visit (HOSPITAL_COMMUNITY): Payer: PRIVATE HEALTH INSURANCE | Attending: Psychiatry | Admitting: Psychiatry

## 2018-05-31 ENCOUNTER — Encounter (HOSPITAL_COMMUNITY): Payer: Self-pay | Admitting: Psychiatry

## 2018-05-31 DIAGNOSIS — Z7722 Contact with and (suspected) exposure to environmental tobacco smoke (acute) (chronic): Secondary | ICD-10-CM | POA: Insufficient documentation

## 2018-05-31 DIAGNOSIS — D649 Anemia, unspecified: Secondary | ICD-10-CM | POA: Diagnosis not present

## 2018-05-31 DIAGNOSIS — F419 Anxiety disorder, unspecified: Secondary | ICD-10-CM | POA: Insufficient documentation

## 2018-05-31 DIAGNOSIS — F4321 Adjustment disorder with depressed mood: Secondary | ICD-10-CM | POA: Diagnosis not present

## 2018-05-31 DIAGNOSIS — Z87440 Personal history of urinary (tract) infections: Secondary | ICD-10-CM | POA: Insufficient documentation

## 2018-05-31 DIAGNOSIS — G4700 Insomnia, unspecified: Secondary | ICD-10-CM | POA: Diagnosis not present

## 2018-05-31 DIAGNOSIS — Z79899 Other long term (current) drug therapy: Secondary | ICD-10-CM | POA: Diagnosis not present

## 2018-05-31 DIAGNOSIS — F3289 Other specified depressive episodes: Secondary | ICD-10-CM | POA: Insufficient documentation

## 2018-05-31 DIAGNOSIS — F332 Major depressive disorder, recurrent severe without psychotic features: Secondary | ICD-10-CM
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
## 2018-06-01 ENCOUNTER — Telehealth (HOSPITAL_COMMUNITY): Payer: Self-pay | Admitting: Psychiatry

## 2018-06-01 ENCOUNTER — Other Ambulatory Visit (HOSPITAL_COMMUNITY): Payer: PRIVATE HEALTH INSURANCE | Admitting: Psychiatry

## 2018-06-01 ENCOUNTER — Encounter (HOSPITAL_COMMUNITY): Payer: Self-pay | Admitting: Family

## 2018-06-01 DIAGNOSIS — F3289 Other specified depressive episodes: Secondary | ICD-10-CM

## 2018-06-01 DIAGNOSIS — F4321 Adjustment disorder with depressed mood: Secondary | ICD-10-CM

## 2018-06-01 MED ORDER — MIRTAZAPINE 7.5 MG PO TABS: 7.5000 mg | ORAL_TABLET | Freq: Every day | ORAL | 0 refills | Status: DC

## 2018-06-01 NOTE — Progress Notes (Signed)
Psychiatric Initial Adult Assessment   Patient Identification: Patricia Lin MRN:  [id number] Date of Evaluation:  06/02/2018 Referral Source: self  Chief Complaint: Depression   Visit Diagnosis:    ICD-10-CM   1. Adjustment disorder with depressed mood F43.21   2. Other depression F32.89     History of Present Illness:  Patricia Lin 37 year old African-American female presents today for increased depression and anxiety.  States she is currently employed at by [employer] states she feels that her employer has been very supportive and has been causing a lot of stress and pressure in the work environment.  Patient reports she is been married for 4 years states they are caring for her 4 children 97 daughter, 38 daughter, 73 daughter and  68-year-old son.  Reports increased depression and anxiety with her oldest daughter moving away for college this fall. Patricia Lin  reports symptoms of worry due to fear of the unknown of her oldest daughter going away to college.  States she recently started to feel overwhelmed , sad and depressed that her children are growing up.Reports she feels her appetite has increased and having a lot of sleepless nights.   Patricia Lin  denies any previous inpatient admissions.  denies suicidal or homicidal  ideations at this time.  Reports past suicide attempt when she was 37 years old, and  states she was started on Zoloft at that time however has not taken medication since.  States she felt the Zoloft helped at that time.  Patient reports she is been followed by a therapist for the past 3 years.  Support encouragement and reassurance was provided  Associated Signs/Symptoms: Depression Symptoms:  depressed mood, feelings of worthlessness/guilt, difficulty concentrating, disturbed sleep, (Hypo) Manic Symptoms:  Distractibility, Irritable Mood, Anxiety Symptoms:  Excessive Worry, Psychotic Symptoms:  Hallucinations: None PTSD Symptoms: NA  Past Psychiatric History: reports she has been  followed by therapist for the past year. Denies pervious inpatient admission or that she is followed by a psychiatrist   Previous Psychotropic Medications: No   Substance Abuse History in the last 12 months:  No.  Reported Occasional EtOH use socially  Consequences of Substance Abuse: NA  Past Medical History:  Past Medical History:  Diagnosis Date  . Abnormal Pap smear   . Anemia   . Anxiety   . BV (bacterial vaginosis) 2011  . Chlamydia infection   . CIN I (cervical intraepithelial neoplasia I) 2008  . Dysrhythmia 2009   TACHYCARDIA;   HAD W/U 2010? POSSIBLE HEART VALVE ISSUE; NOT F/U NEEDED X 3 YEARS PER PT  . Dysuria 2010  . H/O pre-eclampsia in prior pregnancy, currently pregnant   . H/O varicella   . H/O vitamin D deficiency   . Headache(784.0)    MIGRAINES  . Hx of breast reduction, elective   . Hx: UTI (urinary tract infection)   . Infection    UTI DURING PREGNANCY  . Infection    YEAST WITH PREGNANCY  . Infection    CHLAMYDIA X 1  . 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 Low iron   . PIH (pregnancy induced hypertension) 11-2012   Today pp one month.  B/P nl, and 122/76 on repeat-sitting  . Postpartum depression 2011   NO MEDS  . Pregnancy induced hypertension    ALL PREGNANCIES  . Shortness of breath    WITH EXERTION SICNE PREGNANCY  . Tachycardia   . Tachycardia 2010   HAS HAD WORKUP. UNSURE  IF HAS POSSIBLE HEART VALVE ISSUE  . Vulvitis 2009    Past Surgical History:  Procedure Laterality Date  . BREAST REDUCTION SURGERY  2008  . BREAST SURGERY    . DILATION AND CURETTAGE OF UTERUS    . FINGER SURGERY     Right index finger  . TUBAL LIGATION  01/09/2013   Procedure: POST PARTUM TUBAL LIGATION;  Surgeon: Michael LitterNaima A Dillard, MD;  Location: [facility];  Service: Gynecology;  Laterality: Bilateral;  post partum tubal ligation bilateral  . US ECHOCARDIOGRAPHY  06/18/2009   ef 55-60%  . WISDOM TOOTH  EXTRACTION      Family Psychiatric History:   Family History:  Family History  Problem Relation Age of Onset  . Hypertension Mother   . Hyperlipidemia Mother   . Coronary artery disease Mother   . Heart disease Mother        high cholesterol  . Asthma Mother   . Diabetes Mother   . Depression Mother   . Hypertension Father   . Hypertension Maternal Grandmother   . Hypertension Maternal Grandfather   . Asthma Daughter   . Alcohol abuse Maternal Uncle   . Drug abuse Maternal Uncle     Social History:   Social History   Socioeconomic History  . Marital status: Single    Spouse name: Not on file  . Number of children: 3  . Years of education: 7813  . Highest education level: Not on file  Occupational History  . Occupation: Physiological scientistCUSTOMER SERVICE     Employer: [employer]  Social Needs  . Financial resource strain: Not on file  . Food insecurity:    Worry: Not on file    Inability: Not on file  . Transportation needs:    Medical: Not on file    Non-medical: Not on file  Tobacco Use  . Smoking status: Passive Smoke Exposure - Never Smoker  . Smokeless tobacco: Never Used  Substance and Sexual Activity  . Alcohol use: Yes    Comment: WEEKENDS -LIQUOR;  LAST DRANK 04/03/12  . Drug use: No  . Sexual activity: Not Currently    Partners: Male    Birth control/protection: None  Lifestyle  . Physical activity:    Days per week: Not on file    Minutes per session: Not on file  . Stress: Not on file  Relationships  . Social connections:    Talks on phone: Not on file    Gets together: Not on file    Attends religious service: Not on file    Active member of club or organization: Not on file    Attends meetings of clubs or organizations: Not on file    Relationship status: Not on file  Other Topics Concern  . Not on file  Social History Narrative  . Not on file    Additional Social History:   Allergies:   Allergies  Allergen Reactions  . Latex Itching and Rash     Metabolic Disorder Labs: No results found for: HGBA1C, MPG No results found for: PROLACTIN No results found for: CHOL, TRIG, HDL, CHOLHDL, VLDL, LDLCALC   Current Medications: Current Outpatient Medications  Medication Sig Dispense Refill  . cyclobenzaprine (FLEXERIL) 10 MG tablet Take 1 tablet (10 mg total) by mouth 2 (two) times daily as needed for muscle spasms. (Patient not taking: Reported on 12/04/2017) 20 tablet 0  . ferrous sulfate 325 (65 FE) MG tablet Take 1 tablet (325 mg total) by mouth  3 (three) times daily with meals. 90 tablet 0  . ipratropium (ATROVENT) 0.06 % nasal spray Place 2 sprays into both nostrils 4 (four) times daily. (Patient not taking: Reported on 12/04/2017) 15 mL 0  . mirtazapine (REMERON) 7.5 MG tablet Take 1 tablet (7.5 mg total) by mouth at bedtime. 30 tablet 0  . NIFEdipine (PROCARDIA XL) 30 MG 24 hr tablet Take 1 tablet (30 mg total) by mouth daily. (Patient not taking: Reported on 12/16/2014) 30 tablet 0  . oxyCODONE-acetaminophen (PERCOCET/ROXICET) 5-325 MG per tablet Take 1-2 tablets by mouth every 4 (four) hours as needed (moderate - severe pain). (Patient not taking: Reported on 12/16/2014) 30 tablet 0   No current facility-administered medications for this visit.     Neurologic: Headache: No Seizure: No Paresthesias:No  Musculoskeletal: Strength & Muscle Tone: within normal limits Gait & Station: normal Patient leans: N/A  Psychiatric Specialty Exam: Review of Systems  Psychiatric/Behavioral: Positive for depression. Negative for suicidal ideas. The patient is nervous/anxious and has insomnia.     There were no vitals taken for this visit.There is no height or weight on file to calculate BMI.  General Appearance: Casual  Eye Contact:  Fair  Speech:  Clear and Coherent  Volume:  Normal  Mood:  Depressed  Affect:  Congruent  Thought Process:  Coherent  Orientation:  Full (Time, Place, and Person)  Thought Content:  Hallucinations:  None  Suicidal Thoughts:  No  Homicidal Thoughts:  No  Memory:  Immediate;   Fair Recent;   Fair Remote;   Fair  Judgement:  Fair  Insight:  Fair  Psychomotor Activity:  Normal  Concentration:  Concentration: Fair  Recall:  [employer] of Knowledge:Fair  Language: Fair  Akathisia:  No  Handed:  Right  AIMS (if indicated):    Assets:  Communication Skills Desire for Improvement Resilience Social Support  ADL's:  Intact  Cognition: WNL  Sleep:      Treatment Plan Summary: Admit to intensive outpatient program ( IOP) Medication management    Initiate Remeron 7.5mg  PO QHS for insomnia/ depression   Treatment plan was reviewed and agreed upon by N.P Chilton Greathouse and patient Bari need for continued group services   Oneta Rack, NP 6/26/20197:30 AM

## 2018-06-01 NOTE — Progress Notes (Signed)
    Daily Group Progress Note  Program: IOP  Group Time: 9:00-12:00  Participation Level: Active  Behavioral Response: Appropriate  Type of Therapy:  Group Therapy  Summary of Progress:  Pt. Presented as quiet, attentive, responsive to feedback and questions from the therapists and group members. Pt.'s first day in group. Pt. Met with case manager and nurse practitioner for intake assessment. Pt. Connected with other patients around stress caused by a relationship. Pt. Participated in discussion about bibliotherapy (I.e., Co-Dependent No More, Five Love Languages, and Getting the Love that You Want) to work through relationship issues. Pt. Participated in grief and loss session with the Chaplain.    [name], LPC

## 2018-06-01 NOTE — Telephone Encounter (Signed)
D:  [facility]  He called to see if pt was currently attending and confirmed her start date and possible d/c date.  A:  Confirmed that pt was attending and possible end date.  RTW 06-28-18 was recommended by Clinical research associatewriter.

## 2018-06-01 NOTE — Progress Notes (Signed)
    Daily Group Progress Note  Program: IOP Time: 9:00-12:00  Type of Therapy:  Psychoeducational Skills   Participation Level:  Active   Participation Quality:  Appropriate   Affect:  Appropriate   Cognitive:  Appropriate   Insight:  Appropriate   Engagement in Group:  Engaged   Modes of Intervention:  Problem-solving Naelani Skoczylas is a 37 y.o. is a female that presented to Psych IOP. Counselor utilized motivation interviewing skills and group processing as a means for therapeutic intervention. Pt has an hx of depression. This first and second hour of today's session focused on "Mind Set Matters" with focus on changing a "Fixed Mindset" to a "Growth Mindset".  Counselor elicited examples/feedback from patient and other group members to share experiences that are associated with moving toward a growth. Counselor provided a handout to provided examples. Clients were then asked to practice Mindset Matters" with each other. Client shared an example, "Instead of saying I can't do it I will say I'm right on track" or "Instead of stating I'm not good enough I will say I'm just getting started".  Counselor and group praised Yadhira for her motivation towards self-improvement.  Pt is dressed in street clothes, alert, oriented x4 with normal speech and normal motor behavior. Eye contact is fair. Pt's mood is appropriate and affect is congruent with mood. Thought process is coherent and relevant. Pt's insight is fair and judgement is fair. There is no indication that patient  is currently responding to internal stimuli or experiencing delusional thought content. Pt was cooperative throughout assessment and relevant. Pt's insight is fair and judgement is fair. Pt was cooperative throughout the group session on this day.   Shaune PollackBrown, Jennifer B, LPC

## 2018-06-02 ENCOUNTER — Other Ambulatory Visit (HOSPITAL_COMMUNITY): Payer: PRIVATE HEALTH INSURANCE | Admitting: Psychiatry

## 2018-06-02 DIAGNOSIS — F332 Major depressive disorder, recurrent severe without psychotic features: Secondary | ICD-10-CM

## 2018-06-02 DIAGNOSIS — F4321 Adjustment disorder with depressed mood: Secondary | ICD-10-CM | POA: Diagnosis not present

## 2018-06-03 ENCOUNTER — Other Ambulatory Visit (HOSPITAL_COMMUNITY): Payer: PRIVATE HEALTH INSURANCE | Admitting: Psychiatry

## 2018-06-03 DIAGNOSIS — F332 Major depressive disorder, recurrent severe without psychotic features: Secondary | ICD-10-CM

## 2018-06-03 DIAGNOSIS — F4321 Adjustment disorder with depressed mood: Secondary | ICD-10-CM | POA: Diagnosis not present

## 2018-06-03 NOTE — Progress Notes (Signed)
    Daily Group Progress Note  Program: IOP  Group Time: 9:00-12:00  Participation Level: Active  Behavioral Response: Appropriate  Type of Therapy:  Group Therapy  Summary of Progress: Pt. Presented as quiet, appeared tired. Pt. Discussed that she has not been sleeping well and felt very tired and lethargic today. Pt. Discussed that she has struggled with insomnia for approximately the last 8 months. Pt. Discussed that her insomnia was triggered by the death of her grandfather. Pt. Participated in discussion about the grief process and the importance of processing grief. Pt. Indicated that she is not ready to grieve because she is afraid of the sadness. Pt. Participated in discussion about questions to ask yourself before you give up on your depression. Pt. Participated in discussion facilitated by the mental health association.      Shaune PollackBrown, [place] Oborn B, LPC

## 2018-06-04 ENCOUNTER — Other Ambulatory Visit (HOSPITAL_COMMUNITY): Payer: PRIVATE HEALTH INSURANCE | Admitting: Psychiatry

## 2018-06-04 DIAGNOSIS — F332 Major depressive disorder, recurrent severe without psychotic features: Secondary | ICD-10-CM

## 2018-06-04 DIAGNOSIS — F4321 Adjustment disorder with depressed mood: Secondary | ICD-10-CM | POA: Diagnosis not present

## 2018-06-07 ENCOUNTER — Other Ambulatory Visit (HOSPITAL_COMMUNITY): Payer: PRIVATE HEALTH INSURANCE | Attending: Psychiatry

## 2018-06-07 DIAGNOSIS — F4321 Adjustment disorder with depressed mood: Secondary | ICD-10-CM | POA: Insufficient documentation

## 2018-06-07 DIAGNOSIS — F332 Major depressive disorder, recurrent severe without psychotic features: Secondary | ICD-10-CM | POA: Insufficient documentation

## 2018-06-08 ENCOUNTER — Other Ambulatory Visit (HOSPITAL_COMMUNITY): Payer: PRIVATE HEALTH INSURANCE | Admitting: Psychiatry

## 2018-06-08 DIAGNOSIS — F4321 Adjustment disorder with depressed mood: Secondary | ICD-10-CM | POA: Diagnosis present

## 2018-06-08 DIAGNOSIS — F332 Major depressive disorder, recurrent severe without psychotic features: Secondary | ICD-10-CM | POA: Diagnosis not present

## 2018-06-08 DIAGNOSIS — F3289 Other specified depressive episodes: Secondary | ICD-10-CM | POA: Diagnosis present

## 2018-06-08 NOTE — Progress Notes (Signed)
    Daily Group Progress Note  Program: IOP  Group Time: 9:00-12:00  Participation Level: Active  Behavioral Response: Appropriate  Type of Therapy:  Group Therapy  Summary of Progress: Pt. Presented as talkative, engaged in the group process. Pt. Discussed her sadness related to her relationship with her husband who is alcohol dependent and resistant to treatment. Pt. Discussed her history of co-dependent, enabling behaviors and overcompensating in the lives of her children in order to save them from the reality of their father's drinking. Pt. Discussed that she is trying to make the decision about whether or not to continue in the relationship, but feels torn because she loves her husband. Pt. Participated in guided meditation exercise and review of the grounding series with focus on 4-3-8 breathing to manage stress and anxiety.     Shaune PollackBrown, Jennifer B, LPC

## 2018-06-09 ENCOUNTER — Telehealth (HOSPITAL_COMMUNITY): Payer: Self-pay | Admitting: Psychiatry

## 2018-06-09 ENCOUNTER — Other Ambulatory Visit (HOSPITAL_COMMUNITY): Payer: PRIVATE HEALTH INSURANCE | Admitting: Psychiatry

## 2018-06-09 DIAGNOSIS — F332 Major depressive disorder, recurrent severe without psychotic features: Secondary | ICD-10-CM

## 2018-06-09 NOTE — Progress Notes (Signed)
    Daily Group Progress Note  Program: IOP  Group Time: 9:00-12:00  Participation Level: Active  Behavioral Response: Appropriate  Type of Therapy:  Group Therapy  Summary of Progress: Pt. Presents as alert, quiet, engaged in the group process. Pt. Participated in discussion about developing healthy work relationships and managing stress at work. Pt. Continues to process problems with her spouse who is alcohol dependent, gaining clarity about her emotions related to her marriage, and decision about whether to stay in the marriage.     Shaune PollackBrown, Jennifer B, LPC

## 2018-06-11 ENCOUNTER — Other Ambulatory Visit (HOSPITAL_COMMUNITY): Payer: PRIVATE HEALTH INSURANCE

## 2018-06-14 ENCOUNTER — Other Ambulatory Visit (HOSPITAL_COMMUNITY): Payer: PRIVATE HEALTH INSURANCE | Admitting: Psychiatry

## 2018-06-14 DIAGNOSIS — F332 Major depressive disorder, recurrent severe without psychotic features: Secondary | ICD-10-CM
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
## 2018-06-15 ENCOUNTER — Other Ambulatory Visit (HOSPITAL_COMMUNITY): Payer: PRIVATE HEALTH INSURANCE

## 2018-06-15 NOTE — Progress Notes (Signed)
    Daily Group Progress Note  Program: IOP Type of Therapy:  Group Therapy   Participation Level:  Active   Participation Quality:  Appropriate   Affect:  Appropriate   Cognitive:  Appropriate   Insight:  Appropriate   Engagement in Group:  Engaged   Modes of Intervention:  Problem-solving Patricia Lin is a female that presented to Psych IOP. Counselor utilized motivation interviewing skills and group processing as a means for therapeutic intervention.  Pt has an hx of depression and anxiety. She reports that she is feeling depressed today and has extreme anxiety. Patient did not identify any specific stressors at this time This first and second hour of the group was focused on "Self-Care". Clients were provided a Adult nurse"Self-Care Assessment Worksheet". This assessment tool provided an overview of effective strategies to maintain self-care. After completing the full assessment, [name] was asked to choose one item from each area to actively work to improve.  Counselor challenged patient to consider how self-care if neglected may affect the body, mind, and spirit, leaving a feeling of depletion and out of balance. For this reason, it is important to have self-care strategies that address these parts of ourselves. Counselor elicited feedback from client and other members on how to develop effective self-care strategies. The last hour was an invited guest speaker to discuss the science of aromatherapy and it's benefits to promote health and wellbeing.   Pt is dressed in street clothes, alert, oriented x4 with normal speech and normal motor behavior. Eye contact is fair. Pt's mood is depressed and affect is congruent with mood. Thought process is coherent and relevant. Pt's insight is fair and judgement is fair. There is no indication Pt is currently responding to internal stimuli or experiencing delusional thought content. Pt was cooperative throughout assessment.  Shaune PollackBrown, Jennifer B, LPC

## 2018-06-16 ENCOUNTER — Other Ambulatory Visit (HOSPITAL_COMMUNITY): Payer: PRIVATE HEALTH INSURANCE | Admitting: Psychiatry

## 2018-06-16 DIAGNOSIS — F332 Major depressive disorder, recurrent severe without psychotic features: Secondary | ICD-10-CM

## 2018-06-16 NOTE — Progress Notes (Signed)
    Daily Group Progress Note  Program: IOP  Group Time: 9:00-12:00  Participation Level: Active  Behavioral Response: Appropriate  Type of Therapy:  Group Therapy  Summary of Progress: Pt. Presented as quiet, alert and attentive to the group process. Pt. Was responsive to prompts from the therapist and other group members. Pt. Participated in discussion about the importance of developing routines for stress management and learning how to say "No" and setting effective interpersonal boundaries.      Shaune PollackBrown, Jennifer B, LPC

## 2018-06-16 NOTE — Progress Notes (Signed)
    Daily Group Progress Note  Program: IOP  Group Time: 9:00-12:00  Participation Level: Active  Behavioral Response: Appropriate  Type of Therapy:  Group Therapy  Summary of Progress: Pt. Presents as quiet, alert and attentive to the group process. Pt. Was responsive to prompts and questions from the therapist and other group members. Pt. Participated in discussion about patterns of co-dependency in relationships and how co-dependency affects our physical and mental health. Pt. Discussed how she attempts to please everyone in her family and does not know how to say "NO".   Shaune PollackBrown, Jennifer B, LPC

## 2018-06-17 ENCOUNTER — Other Ambulatory Visit (HOSPITAL_COMMUNITY): Payer: PRIVATE HEALTH INSURANCE | Admitting: Psychiatry

## 2018-06-17 DIAGNOSIS — F332 Major depressive disorder, recurrent severe without psychotic features: Secondary | ICD-10-CM | POA: Diagnosis not present

## 2018-06-17 NOTE — Progress Notes (Signed)
    Daily Group Progress Note  Program: IOP  Group Time: 9:00-12:00  Participation Level: Active  Behavioral Response: Appropriate  Type of Therapy:  Group Therapy  Summary of Progress: Pt. Presented as talkative, alert, engaged in the group process. Pt. Became tearful when discussing her husband who is alcohol dependent and fears about remaining in the relationship. Pt. Connected with other group member who is also coping with her anger and resentment regarding alcohol dependent spouse. Pt. Participated in discussion about use of the grounding series for calming anxiety and the Pt.'s Annette StableBill of rights for self-assertion of personal boundaries.     Shaune PollackBrown, Jennifer B, LPC
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
## 2018-06-18 ENCOUNTER — Other Ambulatory Visit (HOSPITAL_COMMUNITY): Payer: PRIVATE HEALTH INSURANCE | Admitting: Licensed Clinical Social Worker

## 2018-06-18 DIAGNOSIS — F332 Major depressive disorder, recurrent severe without psychotic features: Secondary | ICD-10-CM

## 2018-06-18 NOTE — Progress Notes (Signed)
    Daily Group Progress Note  Program: IOP  Group Time: 9 am-12:00 pm  Participation Level: Active  Behavioral Response: Sharing  Type of Therapy:  Group Therapy  Summary of Progress: Summary of Progress: Pt has an hx of depression. Today's presented subject was "Fears We Face" and reports that she is fearful of her 38 year old daughter going to college this upcoming [place]. States that they are very close and she is fearful of becoming lonely with her daughter being gone so far away. She fears that she will not be able to monitor her daughter as she is able to do so at this time.   Counselor provided education to patient on the subject of "Fear" which is one of the most powerful emotions. It has a very strong effect on your mind and body. Counselor provided examples that fear can create strong signals of response when we're in emergencies - for instance, if we are caught in a fire or are being attacked. It can also take effect when you're faced with non-dangerous events, like exams, public speaking, a new job, a date, or even a party. It's a natural response to a threat that can be either perceived or real. Counselor further educated patient that anxiety is a word we use for some types of fear that are usually to do with the thought of a threat or something going wrong in the future, rather than right now. Fear and anxiety can last for a short time and then pass, but they can also last much longer and you can get stuck with them. In some cases they can take over your life, affecting your ability to eat, sleep, concentrate, travel, enjoy life, or even leave the house or go to work or school. This can hold you back from doing things you want or need to do, and it also affects your health. Laykin agreed that sometimes she becomes overwhelmed by fear and wants to avoid situations that might make her frightened or anxious. She reported that it can be hard to break this cycle, but  there are lots of ways to do it. Counselor informed Aaryana that she can learn to feel less fearful and to cope with fear so that it doesn't stop you from living. Counselor challenged Mickelle to consider what makes her afraid. Counselor further encouraged Juliza to discover ways in which her fears such as being a single parent. Torry reported that she could try to spend more time focusing exercising and eating healthier. She plans to Increase the amount of exercise she does. Also monitoring more so what she is eating and make healthier choices.  Pt is dressed in street clothes, alert, oriented x4 with normal speech and normal motor behavior. Eye contact is fair. Pt's mood is appropriate and affect is congruent with mood. Thought process is coherent and relevant. Pt's insight is fair and judgement is fair. There is no indication Pt is currently not responding to internal stimuli or experiencing delusional thought content. Pt was cooperative throughout assessment.

## 2018-06-21 ENCOUNTER — Telehealth (HOSPITAL_COMMUNITY): Payer: Self-pay | Admitting: Psychiatry

## 2018-06-21 ENCOUNTER — Ambulatory Visit (INDEPENDENT_AMBULATORY_CARE_PROVIDER_SITE_OTHER): Payer: PRIVATE HEALTH INSURANCE | Admitting: Licensed Clinical Social Worker

## 2018-06-21 ENCOUNTER — Other Ambulatory Visit (HOSPITAL_COMMUNITY): Payer: PRIVATE HEALTH INSURANCE | Admitting: Psychiatry

## 2018-06-21 ENCOUNTER — Encounter (HOSPITAL_COMMUNITY): Payer: Self-pay

## 2018-06-21 DIAGNOSIS — F332 Major depressive disorder, recurrent severe without psychotic features: Secondary | ICD-10-CM

## 2018-06-22 ENCOUNTER — Other Ambulatory Visit (HOSPITAL_COMMUNITY): Payer: PRIVATE HEALTH INSURANCE | Admitting: Psychiatry

## 2018-06-22 ENCOUNTER — Encounter (HOSPITAL_COMMUNITY): Payer: Self-pay | Admitting: Licensed Clinical Social Worker
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
## 2018-06-22 NOTE — Progress Notes (Signed)
   THERAPIST PROGRESS NOTE  Session Time: 1:10-2pm  Participation Level: Active  Behavioral Response: CasualAlertAnxious  Type of Therapy: Individual Therapy  Treatment Goals addressed: Coping  Interventions: CBT  Summary: Patricia Lin is a 37 y.o. female who presents for her initial individual counseling session. Pt is currently in IOP but came in for individual session. Spent a considerable amount of time building a trusting, therapeutic relationship. Pt shared she is not sharing in group due to other co-workers being in the group. Processed with pt the benefits of participating in group process. Pt has 4 children with the oldest getting ready to go to [employer]. Pt is sad about daughter leaving, who is her best friend and helps her care for the younger children. Her husband has announced he's joining the [employer]. Her husband is an alcoholic and she thinks he's joining so that he won't have any responsibilities. She is not happy with his decision. Pt will return to work on 7/28. Asked open ended questions. Pt also has an appt with Hospice to process the death of her grandfather, who basically raised her. Pt has no support system and limited relationship with siblings. Discussed with pt the importance of a support system and the ability of compartmentalize.  Suicidal/Homicidal: Nowithout intent/plan  Therapist Response: Assessed pt's current functioning and reviewed progress. Assisted pt building a trusting therapeutic relationship and gaining background information.  Plan: Pt scheduled appt to be seen upon completion of IOP  Diagnosis: Axis I:  Severe episode of recurrent major depression, without psychotic features    Guiseppe Flanagan S, LCAS 06/21/18

## 2018-06-23 ENCOUNTER — Other Ambulatory Visit (HOSPITAL_COMMUNITY): Payer: PRIVATE HEALTH INSURANCE | Admitting: Professional

## 2018-06-23 DIAGNOSIS — F332 Major depressive disorder, recurrent severe without psychotic features: Secondary | ICD-10-CM

## 2018-06-24 ENCOUNTER — Telehealth (HOSPITAL_COMMUNITY): Payer: Self-pay | Admitting: Psychiatry

## 2018-06-24 ENCOUNTER — Other Ambulatory Visit (HOSPITAL_COMMUNITY): Payer: PRIVATE HEALTH INSURANCE | Admitting: Psychiatry

## 2018-06-24 NOTE — Progress Notes (Signed)
    Daily Group Progress Note  Program: IOP  Group Time: 9:00-12:00  Participation Level: Active  Behavioral Response: Appropriate  Type of Therapy:  Group Therapy  Summary of Progress: Pt. Presented as talkative, alert, engaged in the group process. Pt.shared she is hopeful about her 38yo daughter getting mental health treatment and wanting to set a good example for her daughter. Pt participated in watching TedTalk "Happiness Advantage" and discussion that followed about how we can train our "lens" of how we view the world to a more positive view.  Pt reports wanting to incorporate meditation in her nightly routine to try to change her lens.  Pt participated in discussion with Racheal Patcheshaplin, Matthew Stalnaker, on self-care. Pt reports finding the quote "Give your stress wings and let it fly away" offensive and dismissive. Pt shared about her struggle to focus on self due to stress.  Marlene Pfluger J Annette Liotta, LPCA, LCASA

## 2018-06-24 NOTE — Progress Notes (Signed)
    Daily Group Progress Note  Program: IOP  Group Time: 0900-1200  Participation Level: Active  Behavioral Response: Appropriate and Sharing  Type of Therapy:  Process Group  Summary of Progress: Patient arrived a little late today due to her first appointment with Hospice for grief/loss counseling.  Patient states although it was difficult, she was able to get some things off her chest.  Encouraged pt to talk about recent losses of daughter going off to college and husband leaving for military next month.  Patient states she is feeling very frightened about what the future may bring with daughter and husband not being at the home.  Also mentioned she is feeling puzzled and undecided about her future.  "I don't even know if I want to continue being married to my husband.  Strongly encouraged pt not to make huge decisions at this time but continue to talk to therapists about it. Pt participated in Yoga with Forde RadonLeAnne Yates, LPC.      Chestine SporeLARK, RITA, M.Ed,CNA

## 2018-06-25 ENCOUNTER — Other Ambulatory Visit (HOSPITAL_COMMUNITY): Payer: PRIVATE HEALTH INSURANCE | Admitting: Professional

## 2018-06-25 DIAGNOSIS — F332 Major depressive disorder, recurrent severe without psychotic features: Secondary | ICD-10-CM

## 2018-06-28 ENCOUNTER — Other Ambulatory Visit (HOSPITAL_COMMUNITY): Payer: PRIVATE HEALTH INSURANCE | Admitting: Psychiatry

## 2018-06-28 DIAGNOSIS — F332 Major depressive disorder, recurrent severe without psychotic features: Secondary | ICD-10-CM | POA: Diagnosis not present

## 2018-06-28 NOTE — Progress Notes (Signed)
    Daily Group Progress Note  Program: IOP  Group Time: 9:00-12:00  Participation Level: Active  Behavioral Response: Appropriate  Type of Therapy:  Group Therapy  Summary of Progress: Pt. Presents with bright affect, quiet, engaged in the group process. Pt. Connected with other group members around themes of parenting young children. Pt. Discussed the challenge of parenting and active role that she has taken in staying engaged in her children's education. Pt. Discussed that she went to the movies over the weekend.    Shaune PollackBrown, Jennifer B, LPC

## 2018-06-29 ENCOUNTER — Other Ambulatory Visit (HOSPITAL_COMMUNITY): Payer: PRIVATE HEALTH INSURANCE | Admitting: Psychiatry

## 2018-06-29 DIAGNOSIS — F332 Major depressive disorder, recurrent severe without psychotic features: Secondary | ICD-10-CM | POA: Diagnosis not present

## 2018-06-29 NOTE — Progress Notes (Signed)
    Daily Group Progress Note  Program: IOP  Group Time: 9:00-12:00  Participation Level: Active  Behavioral Response: Appropriate  Type of Therapy:  Group Therapy  Summary of Progress: Pt. Presented with depressed affect, talkative, engaged in the group process. Pt. Discussed issues with another co-workers death. Group spent time discussing grief counseling and taking care of self during grief. Cln recommends Hospice grief counseling and provided contact information. Pt participated in values and goals discussion. Group discussed SMART goals and how to break big goals into manageable parts. 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  Pt spent time breaking goal down using SMART goals. Pt set goal of setting up staging and taking pictures for a website in the next 2 weeks. Pt reports this aligns with her value of financial security and individual freedom.   Mycah Mcdougall J Renetta Suman, LPCA, LCASA

## 2018-06-30 ENCOUNTER — Other Ambulatory Visit (HOSPITAL_COMMUNITY): Payer: PRIVATE HEALTH INSURANCE | Admitting: Psychiatry

## 2018-06-30 ENCOUNTER — Other Ambulatory Visit (HOSPITAL_COMMUNITY): Payer: Self-pay | Admitting: Family

## 2018-06-30 ENCOUNTER — Encounter (HOSPITAL_COMMUNITY): Payer: Self-pay | Admitting: Psychiatry

## 2018-06-30 DIAGNOSIS — F4321 Adjustment disorder with depressed mood: Secondary | ICD-10-CM

## 2018-06-30 DIAGNOSIS — F332 Major depressive disorder, recurrent severe without psychotic features: Secondary | ICD-10-CM | POA: Diagnosis not present

## 2018-06-30 MED ORDER — MIRTAZAPINE 7.5 MG PO TABS: 7.5000 mg | ORAL_TABLET | Freq: Every day | ORAL | 0 refills | Status: DC

## 2018-06-30 NOTE — Progress Notes (Signed)
  [facility] Discharge Summary  Patricia Lin [id number]  Admission date: 06/03/2018 Discharge date: 06/30/2018  Reason for admission:-Per admission assessment note:Patricia Lin 37 year old African-American female presents today for increased depression and anxiety.  States she is currently employed at by [employer] states she feels that her employer has been very supportive and has been causing a lot of stress and pressure in the work environment.  Patient reports she is been married for 4 years states they are caring for her 4 children 3918 daughter, 3411 daughter, 378 daughter and  38-year-old son.  Reports increased depression and anxiety with her oldest daughter moving away for college this fall. Patricia Lin  reports symptoms of worry due to fear of the unknown of her oldest daughter going away to college.  States she recently started to feel overwhelmed , sad and depressed that her children are growing up.Reports she feels her appetite has increased and having a lot of sleepless nights.    Family of Origin Issues: Continues to report symptoms of worries related to her husband is been Investment banker, corporateenlisted. Reports she continues to have ongoing worries with her oldest daughter going off to college. Reports she has plans to finish her college class this October and she is working on finding support after her husband goes to boot camp.   Progress in Program Toward Treatment Goals: Ongoing, patient has attended and participated with daily group session, although her depression screening has increased at discharge reports she is feeling better overall. States she was reassured to know that other people are dealing with some of the same family stressors. Patient was initiated on Remeron 7.5mg  during her partial hospitalization. patient to continue medications as prescribed and tolerating medications well. Patient continues to deny suicidal or homicidal ideations at discharge.  Progress  (rationale): Patient to follow-up with Idalia NeedleBeth Mackenzie LCAS 07/19/2018 and Dr. Florentina JennyAfreen 09/06/2018  Keep follow-up appt with hospices and aftercare group services. See chart for RTW ( return to work letter)  Remeron 7.5mg  was refilled at discharged x 60 days     Take all medications as prescribed. Keep all follow-up appointments as scheduled.  Do not consume alcohol or use illegal drugs while on prescription medications. Report any adverse effects from your medications to your primary care provider promptly.  In the event of recurrent symptoms or worsening symptoms, call 911, a crisis hotline, or go to the nearest emergency department for evaluation.   Patricia Rackanika N Zyon Rosser, NP 06/30/2018

## 2018-06-30 NOTE — Patient Instructions (Signed)
D:  Patient completed MH-IOP today.  A:  Will follow up with Dr. Lolly MustacheArfeen on 09-06-18 @ 1 pm and Idalia NeedleBeth MacKenzie, LCAS on 07-21-18 @ 2 pm.  Recommended [facility] on Tuesday evenings @ 5:30 pm and [facility] groups.  Patient will also attend grief counseling.  Pt to return to work on 07-05-18; without any restrictions.  R:  Patient receptive.
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
## 2018-06-30 NOTE — Progress Notes (Signed)
    Daily Group Progress Note  Program: IOP  Group Time: 9:00-12:00  Participation Level: Active  Behavioral Response: Appropriate  Type of Therapy:  Group Therapy  Summary of Progress: Pt. Presented with brightened affect. Pt. Prepared for discharge and met with NP and case manager for discharge planning. Pt. Received feedback from the group regarding discharge and continuing commitment to self-care, therapy, and creating effective boundaries with her support system, and creating a broader support system. Pt. Participated in wellness session facilitated by Frederich Balding. Pt. Wellness session focused on developing wellness behaviors related to sleep, nutrition, exercise, and stress management.       [name], LPC

## 2018-06-30 NOTE — Progress Notes (Signed)
    Daily Group Progress Note  Program: IOP  Group Time: 9:00-12:00  Participation Level: Active  Behavioral Response: Appropriate  Type of Therapy:  Group Therapy  Summary of Progress: Pt. Presents as alert, talkative, engaged in the group process. Pt. Discussed her stress regarding the return to work. Pt. Discussed discharge plan. Pt. Participated in cognitive modeling exercise to bring awareness to thoughts about herself as they related to stressful events of her life. Pt. 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     Shaune PollackBrown, Jennifer B, LPC

## 2018-07-01 ENCOUNTER — Other Ambulatory Visit (HOSPITAL_COMMUNITY): Payer: PRIVATE HEALTH INSURANCE

## 2018-07-02 ENCOUNTER — Other Ambulatory Visit (HOSPITAL_COMMUNITY): Payer: PRIVATE HEALTH INSURANCE

## 2018-07-05 ENCOUNTER — Other Ambulatory Visit (HOSPITAL_COMMUNITY): Payer: PRIVATE HEALTH INSURANCE

## 2018-07-06 ENCOUNTER — Other Ambulatory Visit (HOSPITAL_COMMUNITY): Payer: PRIVATE HEALTH INSURANCE

## 2018-07-07 ENCOUNTER — Other Ambulatory Visit (HOSPITAL_COMMUNITY): Payer: PRIVATE HEALTH INSURANCE

## 2018-07-08 ENCOUNTER — Other Ambulatory Visit (HOSPITAL_COMMUNITY): Payer: PRIVATE HEALTH INSURANCE

## 2018-07-09 ENCOUNTER — Other Ambulatory Visit (HOSPITAL_COMMUNITY): Payer: PRIVATE HEALTH INSURANCE

## 2018-07-12 ENCOUNTER — Other Ambulatory Visit (HOSPITAL_COMMUNITY): Payer: PRIVATE HEALTH INSURANCE

## 2018-07-13 ENCOUNTER — Other Ambulatory Visit (HOSPITAL_COMMUNITY): Payer: PRIVATE HEALTH INSURANCE

## 2018-07-14 ENCOUNTER — Other Ambulatory Visit (HOSPITAL_COMMUNITY): Payer: PRIVATE HEALTH INSURANCE

## 2018-07-15 ENCOUNTER — Other Ambulatory Visit (HOSPITAL_COMMUNITY): Payer: PRIVATE HEALTH INSURANCE

## 2018-07-16 ENCOUNTER — Other Ambulatory Visit (HOSPITAL_COMMUNITY): Payer: PRIVATE HEALTH INSURANCE

## 2018-07-19 ENCOUNTER — Other Ambulatory Visit (HOSPITAL_COMMUNITY): Payer: PRIVATE HEALTH INSURANCE

## 2018-07-20 ENCOUNTER — Other Ambulatory Visit (HOSPITAL_COMMUNITY): Payer: PRIVATE HEALTH INSURANCE

## 2018-07-21 ENCOUNTER — Other Ambulatory Visit (HOSPITAL_COMMUNITY): Payer: PRIVATE HEALTH INSURANCE

## 2018-07-21 ENCOUNTER — Encounter (HOSPITAL_COMMUNITY): Payer: Self-pay | Admitting: Licensed Clinical Social Worker

## 2018-07-21 ENCOUNTER — Ambulatory Visit (INDEPENDENT_AMBULATORY_CARE_PROVIDER_SITE_OTHER): Payer: PRIVATE HEALTH INSURANCE | Admitting: Licensed Clinical Social Worker

## 2018-07-21 DIAGNOSIS — F332 Major depressive disorder, recurrent severe without psychotic features: Secondary | ICD-10-CM

## 2018-07-21 NOTE — Progress Notes (Signed)
   THERAPIST PROGRESS NOTE  Session Time: 2:10-3pm  Participation Level: Active  Behavioral Response: CasualAlertAnxious  Type of Therapy: Individual Therapy  Treatment Goals addressed: Improve psychiatric symptoms, Controlled Behavior, Moderated Mood, Improve Unhelpful Thought Patterns, Emotional Regulation Skills (Moderate moods, anger management, stress management), Feel and express a full Range of Emotions, Learn about Diagnosis, Healthy Coping Skills,   Interventions: CBT  Summary: Patricia Lin is a 37 y.o. female who presents for her initial individual counseling session after completion of IOP. Pt presented anxious, overwhelmed and tired. Throughout the session she continuously said she is exhausted. Her oldest daughter just went to college at [employer]. She now has 3 young children who she is solely responsible. Her husband, who lives in the home, is an alcoholic and occassionally works. He is supposedly joining [employer], but has not completed the dr appts required. Pt rtw on Patricia Lin and hates her job at [employer]. She has tried to walk off the job yesterday and today but remembers she has no support and must care for her children. Pt reports she is looking for another job. Asked open ended questions. Discussed what coping skills she can use in her cubicle to assist with the stress at her job. Asked open ended questions about skills she learned in IOP, is she using any? Pt is basically tired. She has no help, family in [place], a job she hates, no support group and no time for self care. Pt will continue in individual therapy to assist her with life.      Suicidal/Homicidal: Nowithout intent/plan  Therapist Response: Assessed pt's current functioning and reviewed progress. Assisted pt processing her life stressors and coping tools. Assisted pt processing the management of her stressors  Plan: See pt in 2 weeks. Hospice counseling? Compartmentilization  Diagnosis: Axis I:  Severe episode of recurrent  major depression, without psychotic features    Darion Juhasz S, LCAS 07/21/18

## 2018-07-22 ENCOUNTER — Other Ambulatory Visit (HOSPITAL_COMMUNITY): Payer: PRIVATE HEALTH INSURANCE

## 2018-07-23 ENCOUNTER — Other Ambulatory Visit (HOSPITAL_COMMUNITY): Payer: PRIVATE HEALTH INSURANCE

## 2018-08-02 ENCOUNTER — Ambulatory Visit (INDEPENDENT_AMBULATORY_CARE_PROVIDER_SITE_OTHER): Payer: PRIVATE HEALTH INSURANCE | Admitting: Licensed Clinical Social Worker

## 2018-08-02 ENCOUNTER — Encounter (HOSPITAL_COMMUNITY): Payer: Self-pay | Admitting: Licensed Clinical Social Worker

## 2018-08-02 DIAGNOSIS — F332 Major depressive disorder, recurrent severe without psychotic features: Secondary | ICD-10-CM

## 2018-08-02 NOTE — Progress Notes (Signed)
   THERAPIST PROGRESS NOTE  Session Time: 1:10-2pm  Participation Level: Active  Behavioral Response: CasualAlertAnxious  Type of Therapy: Individual Therapy  Treatment Goals addressed: Improve psychiatric symptoms, Controlled Behavior, Moderated Mood, Improve Unhelpful Thought Patterns, Emotional Regulation Skills (Moderate moods, anger management, stress management), Feel and express a full Range of Emotions, Learn about Diagnosis, Healthy Coping Skills,   Interventions: CBT  Summary: Patricia Lin is a 37 y.o. female who presents for her individual counseling session. Again, pt presented anxious, overwhelmed and tired. Throughout the session she continuously said she is tired. Over the weekend she went to [place] for her birthday and to see her daughter. Her youngest children stayed with her mother in law. She was disappointed that her husband did not do anything special for her birthday. Discussed expectations and disappointments with pt. Pt discussed how her needs are not being met, discussed alternatives, problem solving. Pt continues to look for another job but has not been successful. Pt reports current job is one of her stressors. Educated pt on the purpose of coping tools to assist with her stressors.       Suicidal/Homicidal: Nowithout intent/plan  Therapist Response: Assessed pt's current functioning and reviewed progress. Assisted pt processing her life stressors and coping tools, needs being met, expectations and disappointments. Assisted pt processing the management of her stressors  Plan: See pt in 2 weeks. Hospice counseling? Compartmentilization  Diagnosis: Axis I:  Severe episode of recurrent major depression, without psychotic features    MACKENZIE,LISBETH S, LCAS 08/02/18

## 2018-09-02 ENCOUNTER — Encounter (INDEPENDENT_AMBULATORY_CARE_PROVIDER_SITE_OTHER): Payer: Self-pay

## 2018-09-06 ENCOUNTER — Ambulatory Visit (INDEPENDENT_AMBULATORY_CARE_PROVIDER_SITE_OTHER): Payer: PRIVATE HEALTH INSURANCE | Admitting: Psychiatry

## 2018-09-06 ENCOUNTER — Encounter (HOSPITAL_COMMUNITY): Payer: Self-pay | Admitting: Psychiatry

## 2018-09-06 ENCOUNTER — Encounter

## 2018-09-06 VITALS — BP 133/88 | HR 66 | Ht 62.0 in | Wt 198.4 lb

## 2018-09-06 DIAGNOSIS — F332 Major depressive disorder, recurrent severe without psychotic features: Secondary | ICD-10-CM

## 2018-09-06 DIAGNOSIS — F411 Generalized anxiety disorder: Secondary | ICD-10-CM | POA: Diagnosis not present

## 2018-09-06 MED ORDER — FLUOXETINE HCL 10 MG PO CAPS
ORAL_CAPSULE | ORAL | 1 refills | Status: DC
Start: 2018-09-06 — End: 2018-09-08

## 2018-09-06 MED ORDER — HYDROXYZINE PAMOATE 25 MG PO CAPS: 25.0000 mg | ORAL_CAPSULE | Freq: Every evening | ORAL | 0 refills | Status: DC | PRN
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
## 2018-09-08 ENCOUNTER — Encounter (INDEPENDENT_AMBULATORY_CARE_PROVIDER_SITE_OTHER): Payer: Self-pay | Admitting: Family Medicine

## 2018-09-08 ENCOUNTER — Ambulatory Visit (INDEPENDENT_AMBULATORY_CARE_PROVIDER_SITE_OTHER): Payer: 59 | Admitting: Family Medicine

## 2018-09-08 VITALS — BP 128/85 | HR 69 | Temp 98.0°F | Ht 62.0 in | Wt 191.0 lb

## 2018-09-08 DIAGNOSIS — D508 Other iron deficiency anemias: Secondary | ICD-10-CM

## 2018-09-08 DIAGNOSIS — R0602 Shortness of breath: Secondary | ICD-10-CM

## 2018-09-08 DIAGNOSIS — E669 Obesity, unspecified: Secondary | ICD-10-CM | POA: Diagnosis not present

## 2018-09-08 DIAGNOSIS — Z6834 Body mass index (BMI) 34.0-34.9, adult: Secondary | ICD-10-CM

## 2018-09-08 DIAGNOSIS — Z1331 Encounter for screening for depression: Secondary | ICD-10-CM | POA: Diagnosis not present

## 2018-09-08 DIAGNOSIS — Z9189 Other specified personal risk factors, not elsewhere classified: Secondary | ICD-10-CM

## 2018-09-08 DIAGNOSIS — E782 Mixed hyperlipidemia: Secondary | ICD-10-CM

## 2018-09-08 DIAGNOSIS — R5383 Other fatigue: Secondary | ICD-10-CM | POA: Diagnosis not present

## 2018-09-08 DIAGNOSIS — Z0289 Encounter for other administrative examinations: Secondary | ICD-10-CM

## 2018-09-08 NOTE — Progress Notes (Signed)
.  Office: [phone]  /  Fax: [phone]   HPI:   Chief Complaint: OBESITY  Patricia Lin (MR# [id number]) is a 37 y.o. female who presents on 09/08/2018 for obesity evaluation and treatment. Current BMI is Body mass index is 34.93 kg/m.Marland Kitchen Patricia Lin has struggled with obesity for years and has been unsuccessful in either losing weight or maintaining [facility] term weight loss. Patricia Lin attended our information session and states she is currently in the action stage of change and ready to dedicate time achieving and maintaining a healthier weight.   Patricia Lin states her family eats meals together she thinks her family will eat healthier with  her she struggles with family and or coworkers weight loss sabotage her desired weight loss is 41 lbs she has been heavy most of  her life she started gaining weight after her 3rd child her heaviest weight ever was 198 lbs she is a picky eater and doesn't like to eat healthier foods  she has significant food cravings issues  she snacks frequently in the evenings she is frequently drinking liquids with calories she frequently makes poor food choices she has problems with excessive hunger  she has binge eating behaviors she struggles with emotional eating    Fatigue Patricia Lin feels her energy is lower than it should be. This has worsened with weight gain and has not worsened recently. Audriella admits to daytime somnolence and  admits to waking up still tired. Patient is at risk for obstructive sleep apnea. Patent has a history of symptoms of daytime fatigue and morning headache. Patient generally gets 5 hours of sleep per night, and states they generally have nightime awakenings. Snoring is present. Apneic episodes are present. Epworth Sleepiness Score is 5.  Dyspnea on exertion Patricia Lin notes increasing shortness of breath with exercising and seems to be worsening over time with weight gain. She notes getting out of breath sooner with activity than she used to. This  has not gotten worse recently. Sabriyah denies orthopnea.  Iron Deficiency Anemia Patricia Lin has a diagnosis of anemia. She has a history of menorrhagia and has had transfusions in the past. She is on iron supplementation. No recent labs in Epic.   Hyperlipidemia (Mixed) Patricia Lin has hyperlipidemia and has been trying to improve her cholesterol levels with intensive lifestyle modification including a low saturated fat diet, exercise and weight loss. She is not on statin, last LDL was elevated and HDL was low. She denies any chest pain, claudication or myalgias.  At risk for cardiovascular disease Patricia Lin is at a higher than average risk for cardiovascular disease due to obesity and hyperlipidemia. She currently denies any chest pain.  Depression Screen Patricia Lin Food and Mood (modified PHQ-9) score was  Depression screen PHQ 2/9 09/08/2018  Decreased Interest 1  Down, Depressed, Hopeless 3  PHQ - 2 Score 4  Altered sleeping 3  Tired, decreased energy 2  Change in appetite 2  Feeling bad or failure about yourself  1  Trouble concentrating 1  Moving slowly or fidgety/restless 0  Suicidal thoughts 1  PHQ-9 Score 14  Difficult doing work/chores Somewhat difficult    ALLERGIES: Allergies  Allergen Reactions  . Latex Itching and Rash    MEDICATIONS: Current Outpatient Medications on File Prior to Visit  Medication Sig Dispense Refill  . ferrous sulfate 325 (65 FE) MG tablet Take 1 tablet (325 mg total) by mouth 3 (three) times daily with meals. 90 tablet 0  . naproxen sodium (ALEVE) 220 MG tablet Take 220 mg by  mouth daily as needed.     No current facility-administered medications on file prior to visit.     PAST MEDICAL HISTORY: Past Medical History:  Diagnosis Date  . Abnormal Pap smear   . Anemia   . Anxiety   . Back pain   . BV (bacterial vaginosis) 2011  . Chlamydia infection   . CIN I (cervical intraepithelial neoplasia I) 2008  . Depression   . Dyspnea   . Dysrhythmia  2009   TACHYCARDIA;   HAD W/U 2010? POSSIBLE HEART VALVE ISSUE; NOT F/U NEEDED X 3 YEARS PER PT  . Dysuria 2010  . Fatigue   . H/O pre-eclampsia in prior pregnancy, currently pregnant   . H/O varicella   . H/O vitamin D deficiency   . Headache(784.0)    MIGRAINES  . HTN (hypertension)   . Hx of breast reduction, elective   . Hx: UTI (urinary tract infection)   . Infection    UTI DURING PREGNANCY  . Infection    YEAST WITH PREGNANCY  . Infection    CHLAMYDIA X 1  . Knee pain   . 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 Low iron   . PIH (pregnancy induced hypertension) 11-2012   Today pp one month.  B/P nl, and 122/76 on repeat-sitting  . Postpartum depression 2011   NO MEDS  . Pregnancy induced hypertension    ALL PREGNANCIES  . Shortness of breath    WITH EXERTION SICNE PREGNANCY  . Tachycardia   . Tachycardia 2010   HAS HAD WORKUP. UNSURE IF HAS POSSIBLE HEART VALVE ISSUE  . Vulvitis 2009    PAST SURGICAL HISTORY: Past Surgical History:  Procedure Laterality Date  . BREAST REDUCTION SURGERY  2008  . BREAST SURGERY    . DILATION AND CURETTAGE OF UTERUS    . FINGER SURGERY     Right index finger  . TUBAL LIGATION  01/09/2013   Procedure: POST PARTUM TUBAL LIGATION;  Surgeon: Michael Litter, MD;  Location: [facility];  Service: Gynecology;  Laterality: Bilateral;  post partum tubal ligation bilateral  . US ECHOCARDIOGRAPHY  06/18/2009   ef 55-60%  . WISDOM TOOTH EXTRACTION      SOCIAL HISTORY: Social History   Tobacco Use  . Smoking status: Passive Smoke Exposure - Never Smoker  . Smokeless tobacco: Never Used  Substance Use Topics  . Alcohol use: Yes    Comment: WEEKENDS -LIQUOR;  LAST DRANK 04/03/12  . Drug use: No    FAMILY HISTORY: Family History  Problem Relation Age of Onset  . Hypertension Mother   . Hyperlipidemia Mother   . Coronary artery disease Mother   . Heart disease Mother        high  cholesterol  . Asthma Mother   . Diabetes Mother   . Depression Mother   . Anxiety disorder Mother   . Sleep apnea Mother   . Obesity Mother   . Eating disorder Mother   . Hypertension Father   . Hypertension Maternal Grandmother   . Hypertension Maternal Grandfather   . Asthma Daughter   . Alcohol abuse Maternal Uncle   . Drug abuse Maternal Uncle     ROS: Review of Systems  Constitutional: Positive for malaise/fatigue. Negative for weight loss.  Respiratory: Positive for shortness of breath (with exertion).   Cardiovascular: Negative for chest pain, orthopnea and claudication.  Musculoskeletal: Positive for back pain. Negative for myalgias.       +  Muscle or joint pain  Neurological: Positive for headaches.  Psychiatric/Behavioral: Positive for depression. Negative for suicidal ideas.       + Stress    PHYSICAL EXAM: Blood pressure 128/85, pulse 69, temperature 98 F (36.7 C), temperature source Oral, height 5\' 2"  (1.575 m), weight 191 lb (86.6 kg), last menstrual period 09/01/2018, SpO2 100 %. Body mass index is 34.93 kg/m. Physical Exam  Constitutional: She is oriented to person, place, and time. She appears well-developed and well-nourished.  HENT:  Head: Normocephalic and atraumatic.  Nose: Nose normal.  Eyes: EOM are normal. No scleral icterus.  Neck: Normal range of motion. Neck supple. No thyromegaly present.  Cardiovascular: Normal rate and regular rhythm.  Pulmonary/Chest: Effort normal. No respiratory distress.  Abdominal: Soft. There is no tenderness.  + Obesity  Musculoskeletal:  Range of Motion normal in all 4 extremities Trace edema noted in bilateral lower extremities  Neurological: She is alert and oriented to person, place, and time. Coordination normal.  Skin: Skin is warm and dry.  Psychiatric: She has a normal mood and affect. Her behavior is normal.  Vitals reviewed.   RECENT LABS AND TESTS: BMET    Component Value Date/Time   NA 138  12/05/2017 0747   K 3.7 12/05/2017 0747   CL 110 12/05/2017 0747   CO2 23 12/05/2017 0747   GLUCOSE 95 12/05/2017 0747   BUN 11 12/05/2017 0747   CREATININE 0.69 12/05/2017 0747   CREATININE 0.64 01/17/2013 1720   CALCIUM 8.8 (L) 12/05/2017 0747   GFRNONAA >60 12/05/2017 0747   GFRAA >60 12/05/2017 0747   No results found for: HGBA1C No results found for: INSULIN CBC    Component Value Date/Time   WBC 6.0 12/06/2017 0000   RBC 4.52 12/06/2017 0000   HGB 8.9 (L) 12/06/2017 0000   HCT 30.2 (L) 12/06/2017 0000   PLT 265 12/06/2017 0000   MCV 66.8 (L) 12/06/2017 0000   MCH 19.7 (L) 12/06/2017 0000   MCHC 29.5 (L) 12/06/2017 0000   RDW 24.8 (H) 12/06/2017 0000   LYMPHSABS 1.9 12/04/2017 1858   MONOABS 0.3 12/04/2017 1858   EOSABS 0.1 12/04/2017 1858   BASOSABS 0.0 12/04/2017 1858   Iron/TIBC/Ferritin/ %Sat    Component Value Date/Time   IRON 14 (L) 12/04/2017 2243   TIBC 417 12/04/2017 2243   FERRITIN 2 (L) 12/04/2017 2243   IRONPCTSAT 3 (L) 12/04/2017 2243   Lipid Panel  No results found for: CHOL, TRIG, HDL, CHOLHDL, VLDL, LDLCALC, LDLDIRECT Hepatic Function Panel     Component Value Date/Time   PROT 7.0 01/17/2013 1720   ALBUMIN 4.0 01/17/2013 1720   AST 20 01/17/2013 1720   ALT 22 01/17/2013 1720   ALKPHOS 111 01/17/2013 1720   BILITOT 0.5 01/17/2013 1720   No results found for: TSH Vitamin D No recent labs  ECG  shows NSR with a rate of 69 BPM INDIRECT CALORIMETER done today shows a VO2 of 265 and a REE of 1842. Her calculated basal metabolic rate is 1610 thus her basal metabolic rate is better than expected.    ASSESSMENT AND PLAN: Other fatigue - Plan: EKG 12-Lead, Vitamin B12, CBC With Differential, Comprehensive metabolic panel, Folate, Hemoglobin A1c, Insulin, random, T3, T4, free, TSH, VITAMIN D 25 Hydroxy (Vit-D Deficiency, Fractures)  Shortness of breath on exertion - Plan: CBC With Differential  Other iron deficiency anemia - Plan: Anemia  panel  Mixed hyperlipidemia - Plan: Lipid Panel With LDL/HDL Ratio  Depression screening  At risk for heart disease  Class 1 obesity with serious comorbidity and body mass index (BMI) of 34.0 to 34.9 in adult, unspecified obesity type  PLAN:  Fatigue Patricia Lin was informed that her fatigue may be related to obesity, depression or many other causes. Labs will be ordered, and in the meanwhile Patricia Lin has agreed to work on diet, exercise and weight loss to help with fatigue. Proper sleep hygiene was discussed including the need for 7-8 hours of quality sleep each night. A sleep study was not ordered based on symptoms and Epworth score.  Dyspnea on exertion Patricia Lin shortness of breath appears to be obesity related and exercise induced. She has agreed to work on weight loss and gradually increase exercise to treat her exercise induced shortness of breath. If Patricia Lin follows our instructions and loses weight without improvement of her shortness of breath, we will plan to refer to pulmonology. We will monitor this condition regularly. Patricia Lin agrees to this plan.  Iron Deficiency Anemia The diagnosis of Iron deficiency anemia was discussed with Patricia Lin and was explained in detail. She was given suggestions of iron rich foods and iron supplement was not prescribed. We will check labs and Patricia Lin agrees to follow up with our clinic in 2 weeks.  Hyperlipidemia (Mixed) Amely was informed of the American Heart Association Guidelines emphasizing intensive lifestyle modifications as the first line treatment for hyperlipidemia. We discussed many lifestyle modifications today in depth, and Kaedence will continue to work on decreasing saturated fats such as fatty red meat, butter and many fried foods. She will start diet, and will also increase vegetables and lean protein in her diet and continue to work on exercise and weight loss efforts. We will check labs and Patricia Lin agrees to follow up with our clinic in 2  weeks.  Cardiovascular risk counselling Patricia Lin was given extended (15 minutes) coronary artery disease prevention counseling today. She is 37 y.o. female and has risk factors for heart disease including obesity and hyperlipidemia. We discussed intensive lifestyle modifications today with an emphasis on specific weight loss instructions and strategies. Pt was also informed of the importance of increasing exercise and decreasing saturated fats to help prevent heart disease.  Depression Screen Patricia Lin had a moderately positive depression screening. Depression is commonly associated with obesity and often results in emotional eating behaviors. We will monitor this closely and work on CBT to help improve the non-hunger eating patterns. Referral to Psychology may be required if no improvement is seen as she continues in our clinic.  Obesity Patricia Lin is currently in the action stage of change and her goal is to continue with weight loss efforts She has agreed to follow the Category 3 plan Patricia Lin has been instructed to work up to a goal of 150 minutes of combined cardio and strengthening exercise per week for weight loss and overall health benefits. We discussed the following Behavioral Modification Strategies today: increasing lean protein intake, decreasing simple carbohydrates , decrease eating out and dealing with family or coworker sabotage  Lanisha has agreed to follow up with our clinic in 2 weeks. She was informed of the importance of frequent follow up visits to maximize her success with intensive lifestyle modifications for her multiple health conditions. She was informed we would discuss her lab results at her next visit unless there is a critical issue that needs to be addressed sooner. Jamari agreed to keep her next visit at the agreed upon time to discuss these results.    OBESITY BEHAVIORAL INTERVENTION VISIT  Today's visit was # 1   Starting weight: 191 lbs Starting date: 09/08/18 Today's  weight : 191 lbs  Today's date: 09/08/2018 Total lbs lost to date: 0    ASK: We discussed the diagnosis of obesity with Shaneen Mcauliff today and Selena agreed to give [place] permission to discuss obesity behavioral modification therapy today.  ASSESS: Tekisha has the diagnosis of obesity and her BMI today is 34.93 Merie is in the action stage of change   ADVISE: Aalliyah was educated on the multiple health risks of obesity as well as the benefit of weight loss to improve her health. She was advised of the need for [place] term treatment and the importance of lifestyle modifications to improve her current health and to decrease her risk of future health problems.  AGREE: Multiple dietary modification options and treatment options were discussed and  Rendi agreed to follow the recommendations documented in the above note.  ARRANGE: Delenn was educated on the importance of frequent visits to treat obesity as outlined per CMS and USPSTF guidelines and agreed to schedule her next follow up appointment today.   I, Burt Knack, am acting as transcriptionist for Quillian Quince, MD  I have reviewed the above documentation for accuracy and completeness, and I agree with the above. -Quillian Quince, MD

## 2018-09-09 LAB — CBC WITH DIFFERENTIAL
BASOS ABS: 0 10*3/uL (ref 0.0–0.2)
Basos: 0 %
EOS (ABSOLUTE): 0 10*3/uL (ref 0.0–0.4)
Eos: 1 %
HEMOGLOBIN: 11.1 g/dL (ref 11.1–15.9)
IMMATURE GRANS (ABS): 0 10*3/uL (ref 0.0–0.1)
Immature Granulocytes: 0 %
Lymphocytes Absolute: 1.6 10*3/uL (ref 0.7–3.1)
Lymphs: 42 %
MCH: 26.4 pg — ABNORMAL LOW (ref 26.6–33.0)
MCHC: 31.8 g/dL (ref 31.5–35.7)
MCV: 83 fL (ref 79–97)
MONOCYTES: 9 %
Monocytes Absolute: 0.4 10*3/uL (ref 0.1–0.9)
Neutrophils Absolute: 1.8 10*3/uL (ref 1.4–7.0)
Neutrophils: 48 %
RBC: 4.21 x10E6/uL (ref 3.77–5.28)
RDW: 13.6 % (ref 12.3–15.4)
WBC: 3.8 10*3/uL (ref 3.4–10.8)

## 2018-09-09 LAB — INSULIN, RANDOM: INSULIN: 11.5 u[IU]/mL (ref 2.6–24.9)

## 2018-09-09 LAB — COMPREHENSIVE METABOLIC PANEL
ALBUMIN: 4.5 g/dL (ref 3.5–5.5)
ALT: 30 IU/L (ref 0–32)
AST: 25 IU/L (ref 0–40)
Albumin/Globulin Ratio: 1.4 (ref 1.2–2.2)
Alkaline Phosphatase: 78 IU/L (ref 39–117)
BUN/Creatinine Ratio: 19 (ref 9–23)
BUN: 14 mg/dL (ref 6–20)
Bilirubin Total: 0.8 mg/dL (ref 0.0–1.2)
CALCIUM: 9.8 mg/dL (ref 8.7–10.2)
CO2: 23 mmol/L (ref 20–29)
CREATININE: 0.75 mg/dL (ref 0.57–1.00)
Chloride: 99 mmol/L (ref 96–106)
GFR, EST AFRICAN AMERICAN: 117 mL/min/{1.73_m2} (ref >59)
GFR, EST NON AFRICAN AMERICAN: 101 mL/min/{1.73_m2} (ref >59)
GLUCOSE: 88 mg/dL (ref 65–99)
Globulin, Total: 3.2 g/dL (ref 1.5–4.5)
Potassium: 4.1 mmol/L (ref 3.5–5.2)
Sodium: 136 mmol/L (ref 134–144)
Total Protein: 7.7 g/dL (ref 6.0–8.5)

## 2018-09-09 LAB — LIPID PANEL WITH LDL/HDL RATIO
CHOLESTEROL TOTAL: 193 mg/dL (ref 100–199)
HDL: 78 mg/dL (ref >39)
LDL Calculated: 101 mg/dL — ABNORMAL HIGH (ref 0–99)
LDl/HDL Ratio: 1.3 ratio (ref 0.0–3.2)
Triglycerides: 71 mg/dL (ref 0–149)
VLDL CHOLESTEROL CAL: 14 mg/dL (ref 5–40)

## 2018-09-09 LAB — T4, FREE: FREE T4: 1.26 ng/dL (ref 0.82–1.77)

## 2018-09-09 LAB — HEMOGLOBIN A1C
ESTIMATED AVERAGE GLUCOSE: 103 mg/dL
Hgb A1c MFr Bld: 5.2 % (ref 4.8–5.6)

## 2018-09-09 LAB — ANEMIA PANEL
FERRITIN: 9 ng/mL — AB (ref 15–150)
FOLATE, RBC: 1020 ng/mL (ref >498)
Folate, Hemolysate: 355.9 ng/mL
Hematocrit: 34.9 % (ref 34.0–46.6)
IRON SATURATION: 9 % — AB (ref 15–55)
Iron: 32 ug/dL (ref 27–159)
RETIC CT PCT: 2.1 % (ref 0.6–2.6)
TIBC: 351 ug/dL (ref 250–450)
UIBC: 319 ug/dL (ref 131–425)
Vitamin B-12: 706 pg/mL (ref 232–1245)

## 2018-09-09 LAB — T3: T3, Total: 126 ng/dL (ref 71–180)

## 2018-09-09 LAB — VITAMIN D 25 HYDROXY (VIT D DEFICIENCY, FRACTURES): VIT D 25 HYDROXY: 9.6 ng/mL — AB (ref 30.0–100.0)

## 2018-09-09 LAB — TSH: TSH: 0.755 u[IU]/mL (ref 0.450–4.500)

## 2018-09-09 LAB — FOLATE: Folate: 9.6 ng/mL (ref >3.0)

## 2018-09-20 ENCOUNTER — Ambulatory Visit (INDEPENDENT_AMBULATORY_CARE_PROVIDER_SITE_OTHER): Payer: 59 | Admitting: Family Medicine

## 2018-09-20 VITALS — BP 108/73 | HR 82 | Temp 98.7°F | Ht 62.0 in | Wt 185.0 lb

## 2018-09-20 DIAGNOSIS — D508 Other iron deficiency anemias: Secondary | ICD-10-CM | POA: Diagnosis not present

## 2018-09-20 DIAGNOSIS — E559 Vitamin D deficiency, unspecified: Secondary | ICD-10-CM | POA: Diagnosis not present

## 2018-09-20 DIAGNOSIS — E8881 Metabolic syndrome: Secondary | ICD-10-CM | POA: Diagnosis not present

## 2018-09-20 DIAGNOSIS — Z6833 Body mass index (BMI) 33.0-33.9, adult: Secondary | ICD-10-CM

## 2018-09-20 DIAGNOSIS — Z9189 Other specified personal risk factors, not elsewhere classified: Secondary | ICD-10-CM | POA: Diagnosis not present

## 2018-09-20 DIAGNOSIS — E669 Obesity, unspecified: Secondary | ICD-10-CM

## 2018-09-20 MED ORDER — VITAMIN D (ERGOCALCIFEROL) 1.25 MG (50000 UNIT) PO CAPS: 50000.0000 [IU] | ORAL_CAPSULE | ORAL | 0 refills | Status: DC

## 2018-09-21 NOTE — Progress Notes (Signed)
Office: [phone]  /  Fax: [phone]   HPI:   Chief Complaint: OBESITY Patricia Lin is here to discuss her progress with her obesity treatment plan. She is on the  follow the Category 3 plan and is following her eating plan approximately 100 % of the time. She states she is exercising 0 minutes 0 times per week. Patricia Lin has done well with weight loss on her Category 3 plan. She states hunger is controlled but she struggled to eat all her food and is very bored with breakfast options.  Her weight is 185 lb (83.9 kg) today and has had a weight loss of 6 pounds over a period of 2 weeks since her last visit. She has lost 6 lbs since starting treatment with [place].  Vitamin D deficiency Patricia Lin has a new diagnosis of vitamin D deficiency. She is not currently taking vit D and denies nausea, vomiting or muscle weakness. She reports fatigue.   Iron Deficiency Anemia  Patricia Lin has a diagnosis of anemia.  She notes none and is not on iron supplementation. Her hemoglobin is improving and is now on an iron-rich diet.   Insulin Resistance Patricia Lin has a new diagnosis of insulin resistance based on her elevated fasting insulin level >5. Although Patricia Lin's blood glucose readings are still under good control, insulin resistance puts her at greater risk of metabolic syndrome and diabetes. She is not taking metformin currently and continues to work on diet and exercise to decrease risk of diabetes. She admits to polyphagia but reports it has improved with diet.   At risk for diabetes Patricia Lin is at higher than averagerisk for developing diabetes due to her obesity. She currently denies polyuria or polydipsia.   ALLERGIES: Allergies  Allergen Reactions  . Latex Itching and Rash    MEDICATIONS: Current Outpatient Medications on File Prior to Visit  Medication Sig Dispense Refill  . ferrous sulfate 325 (65 FE) MG tablet Take 1 tablet (325 mg total) by mouth 3 (three) times daily with meals. 90 tablet 0  .  naproxen sodium (ALEVE) 220 MG tablet Take 220 mg by mouth daily as needed.     No current facility-administered medications on file prior to visit.     PAST MEDICAL HISTORY: Past Medical History:  Diagnosis Date  . Abnormal Pap smear   . Anemia   . Anxiety   . Back pain   . BV (bacterial vaginosis) 2011  . Chlamydia infection   . CIN I (cervical intraepithelial neoplasia I) 2008  . Depression   . Dyspnea   . Dysrhythmia 2009   TACHYCARDIA;   HAD W/U 2010? POSSIBLE HEART VALVE ISSUE; NOT F/U NEEDED X 3 YEARS PER PT  . Dysuria 2010  . Fatigue   . H/O pre-eclampsia in prior pregnancy, currently pregnant   . H/O varicella   . H/O vitamin D deficiency   . Headache(784.0)    MIGRAINES  . HTN (hypertension)   . Hx of breast reduction, elective   . Hx: UTI (urinary tract infection)   . Infection    UTI DURING PREGNANCY  . Infection    YEAST WITH PREGNANCY  . Infection    CHLAMYDIA X 1  . Knee pain   . 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 Low iron   . PIH (pregnancy induced hypertension) 11-2012   Today pp one month.  B/P nl, and 122/76 on repeat-sitting  . Postpartum depression 2011   NO  MEDS  . Pregnancy induced hypertension    ALL PREGNANCIES  . Shortness of breath    WITH EXERTION SICNE PREGNANCY  . Tachycardia   . Tachycardia 2010   HAS HAD WORKUP. UNSURE IF HAS POSSIBLE HEART VALVE ISSUE  . Vulvitis 2009    PAST SURGICAL HISTORY: Past Surgical History:  Procedure Laterality Date  . BREAST REDUCTION SURGERY  2008  . BREAST SURGERY    . DILATION AND CURETTAGE OF UTERUS    . FINGER SURGERY     Right index finger  . TUBAL LIGATION  01/09/2013   Procedure: POST PARTUM TUBAL LIGATION;  Surgeon: Michael Litter, MD;  Location: [facility];  Service: Gynecology;  Laterality: Bilateral;  post partum tubal ligation bilateral  . US ECHOCARDIOGRAPHY  06/18/2009   ef 55-60%  . WISDOM TOOTH EXTRACTION      SOCIAL  HISTORY: Social History   Tobacco Use  . Smoking status: Passive Smoke Exposure - Never Smoker  . Smokeless tobacco: Never Used  Substance Use Topics  . Alcohol use: Yes    Comment: WEEKENDS -LIQUOR;  LAST DRANK 04/03/12  . Drug use: No    FAMILY HISTORY: Family History  Problem Relation Age of Onset  . Hypertension Mother   . Hyperlipidemia Mother   . Coronary artery disease Mother   . Heart disease Mother        high cholesterol  . Asthma Mother   . Diabetes Mother   . Depression Mother   . Anxiety disorder Mother   . Sleep apnea Mother   . Obesity Mother   . Eating disorder Mother   . Hypertension Father   . Hypertension Maternal Grandmother   . Hypertension Maternal Grandfather   . Asthma Daughter   . Alcohol abuse Maternal Uncle   . Drug abuse Maternal Uncle     ROS: Review of Systems  Constitutional: Positive for malaise/fatigue and weight loss.  Gastrointestinal: Negative for nausea and vomiting.  Musculoskeletal:       Negative for muscle weakness  Endo/Heme/Allergies: Negative for polydipsia.       Negative for polyuria  Positive for polyphagia    PHYSICAL EXAM: Blood pressure 108/73, pulse 82, temperature 98.7 F (37.1 C), temperature source Oral, height 5\' 2"  (1.575 m), weight 185 lb (83.9 kg), last menstrual period 09/01/2018, SpO2 100 %. Body mass index is 33.84 kg/m. Physical Exam  Constitutional: She is oriented to person, place, and time. She appears well-developed and well-nourished.  HENT:  Head: Normocephalic.  Neck: Normal range of motion.  Cardiovascular: Normal rate.  Pulmonary/Chest: Effort normal.  Musculoskeletal: Normal range of motion.  Neurological: She is alert and oriented to person, place, and time.  Skin: Skin is warm and dry.  Psychiatric: She has a normal mood and affect. Her behavior is normal.  Vitals reviewed.   RECENT LABS AND TESTS: BMET    Component Value Date/Time   NA 136 09/08/2018 1042   K 4.1 09/08/2018  1042   CL 99 09/08/2018 1042   CO2 23 09/08/2018 1042   GLUCOSE 88 09/08/2018 1042   GLUCOSE 95 12/05/2017 0747   BUN 14 09/08/2018 1042   CREATININE 0.75 09/08/2018 1042   CREATININE 0.64 01/17/2013 1720   CALCIUM 9.8 09/08/2018 1042   GFRNONAA 101 09/08/2018 1042   GFRAA 117 09/08/2018 1042   Lab Results  Component Value Date   HGBA1C 5.2 09/08/2018   Lab Results  Component Value Date   INSULIN 11.5 09/08/2018   CBC  Component Value Date/Time   WBC 3.8 09/08/2018 1042   WBC 6.0 12/06/2017 0000   RBC 4.21 09/08/2018 1042   RBC 4.52 12/06/2017 0000   HGB 11.1 09/08/2018 1042   HCT 34.9 09/08/2018 1042   PLT 265 12/06/2017 0000   MCV 83 09/08/2018 1042   MCH 26.4 (L) 09/08/2018 1042   MCH 19.7 (L) 12/06/2017 0000   MCHC 31.8 09/08/2018 1042   MCHC 29.5 (L) 12/06/2017 0000   RDW 13.6 09/08/2018 1042   LYMPHSABS 1.6 09/08/2018 1042   MONOABS 0.3 12/04/2017 1858   EOSABS 0.0 09/08/2018 1042   BASOSABS 0.0 09/08/2018 1042   Iron/TIBC/Ferritin/ %Sat    Component Value Date/Time   IRON 32 09/08/2018 1042   TIBC 351 09/08/2018 1042   FERRITIN 9 (L) 09/08/2018 1042   IRONPCTSAT 9 (LL) 09/08/2018 1042   Lipid Panel     Component Value Date/Time   CHOL 193 09/08/2018 1042   TRIG 71 09/08/2018 1042   HDL 78 09/08/2018 1042   LDLCALC 101 (H) 09/08/2018 1042   Hepatic Function Panel     Component Value Date/Time   PROT 7.7 09/08/2018 1042   ALBUMIN 4.5 09/08/2018 1042   AST 25 09/08/2018 1042   ALT 30 09/08/2018 1042   ALKPHOS 78 09/08/2018 1042   BILITOT 0.8 09/08/2018 1042      Component Value Date/Time   TSH 0.755 09/08/2018 1042     Ref. Range 09/08/2018 10:42  Vitamin D, 25-Hydroxy Latest Ref Range: 30.0 - 100.0 ng/mL 9.6 (L)   ASSESSMENT AND PLAN: Vitamin D deficiency - Plan: Vitamin D, Ergocalciferol, (DRISDOL) 50000 units CAPS capsule  Other iron deficiency anemia  Insulin resistance  At risk for diabetes mellitus  Class 1 obesity with  serious comorbidity and body mass index (BMI) of 33.0 to 33.9 in adult, unspecified obesity type  PLAN: Vitamin D Deficiency Castella was informed that low vitamin D levels contributes to fatigue and are associated with obesity, breast, and colon cancer. She agrees to start to take prescription Vit D @50 ,000 IU every week #4 with no refills and will follow up for routine testing of vitamin D, at least 2-3 times per year. She was informed of the risk of over-replacement of vitamin D and agrees to not increase her dose unless she discusses this with [place] first. Agrees to follow up with our clinic as directed.   Iron Deficiency Anemia  The diagnosis of Iron deficiency anemia was discussed with Patricia Lin and was explained in detail. She was given suggestions of iron rich foods and and iron supplement was not prescribed although she may need to start iron supplement. Labs will be rechecked in 3 months. Agrees to follow up with our clinic as directed.   Insulin Resistance Patricia Lin will continue to work on weight loss, exercise, and decreasing simple carbohydrates in her diet to help decrease the risk of diabetes. We dicussed metformin including benefits and risks. She was informed that eating too many simple carbohydrates or too many calories at one sitting increases the likelihood of GI side effects. Patricia Lin declined metformin for now and prescription was not written today. Patricia Lin agreed to follow up with [place] as directed to monitor her progress. We will repeat labs in 3 months.   Diabetes risk counseling Patricia Lin was given extended (15 minutes) diabetes prevention counseling today. She is 37 y.o. female and has risk factors for diabetes including obesity. We discussed intensive lifestyle modifications today with an emphasis on weight loss as well as  increasing exercise and decreasing simple carbohydrates in her diet.  Obesity Patricia Lin is currently in the action stage of change. As such, her goal is to continue with  weight loss efforts She has agreed to follow the Category 3 plan Patricia Lin has been instructed to work up to a goal of 150 minutes of combined cardio and strengthening exercise per week for weight loss and overall health benefits. We discussed the following Behavioral Modification Strategies today: increasing lean protein intake, decreasing simple carbohydrates  and work on meal planning and easy cooking plans   Patricia Lin has agreed to follow up with our clinic in 2 weeks. She was informed of the importance of frequent follow up visits to maximize her success with intensive lifestyle modifications for her multiple health conditions.   OBESITY BEHAVIORAL INTERVENTION VISIT  Today's visit was # 2   Starting weight: 191 lb Starting date: 09/08/18 Today's weight : 185 lb  Today's date: 09/20/18 Total lbs lost to date: 6 lb    ASK: We discussed the diagnosis of obesity with Patricia Lin today and Patricia Lin agreed to give [place] permission to discuss obesity behavioral modification therapy today.  ASSESS: Patricia Lin has the diagnosis of obesity and her BMI today is 33.83 Patricia Lin is in the action stage of change   ADVISE: Patricia Lin was educated on the multiple health risks of obesity as well as the benefit of weight loss to improve her health. She was advised of the need for Patricia Lin term treatment and the importance of lifestyle modifications to improve her current health and to decrease her risk of future health problems.  AGREE: Multiple dietary modification options and treatment options were discussed and  Patricia Lin agreed to follow the recommendations documented in the above note.  ARRANGE: Patricia Lin was educated on the importance of frequent visits to treat obesity as outlined per CMS and USPSTF guidelines and agreed to schedule her next follow up appointment today.  I, Jeralene Peters, am acting as transcriptionist for Quillian Quince, MD   I have reviewed the above documentation for accuracy and completeness,  and I agree with the above. -Quillian Quince, MD

## 2018-10-04 ENCOUNTER — Ambulatory Visit (INDEPENDENT_AMBULATORY_CARE_PROVIDER_SITE_OTHER): Payer: 59 | Admitting: Family Medicine

## 2018-10-04 ENCOUNTER — Encounter (INDEPENDENT_AMBULATORY_CARE_PROVIDER_SITE_OTHER): Payer: Self-pay | Admitting: Family Medicine

## 2018-10-04 VITALS — BP 111/76 | HR 83 | Temp 98.6°F | Ht 62.0 in | Wt 187.0 lb

## 2018-10-04 DIAGNOSIS — D508 Other iron deficiency anemias: Secondary | ICD-10-CM

## 2018-10-04 DIAGNOSIS — E559 Vitamin D deficiency, unspecified: Secondary | ICD-10-CM

## 2018-10-04 DIAGNOSIS — E669 Obesity, unspecified: Secondary | ICD-10-CM | POA: Diagnosis not present

## 2018-10-04 DIAGNOSIS — E8881 Metabolic syndrome: Secondary | ICD-10-CM | POA: Diagnosis not present

## 2018-10-04 DIAGNOSIS — Z6834 Body mass index (BMI) 34.0-34.9, adult: Secondary | ICD-10-CM

## 2018-10-05 ENCOUNTER — Encounter (INDEPENDENT_AMBULATORY_CARE_PROVIDER_SITE_OTHER): Payer: Self-pay | Admitting: Family Medicine

## 2018-10-05 NOTE — Progress Notes (Signed)
Office: [phone]  /  Fax: [phone]   HPI:   Chief Complaint: OBESITY Patricia Lin is here to discuss her progress with her obesity treatment plan. She is on the Category 3 plan and is following her eating plan approximately 80 % of the time. She states she is exercising 0 minutes 0 times per week. Patricia Lin has recently been off the plan with [employer] homecoming this past weekend. She plans to get back on her meal plan. She is eating all of her food on her plan and she has cut out soda.  Her weight is 187 lb (84.8 kg) today and has had a weight gain of 2 pounds over a period of 2 weeks since her last visit. She has lost 4 lbs since starting treatment with [place].  Vitamin D deficiency Patricia Lin has a diagnosis of vitamin D deficiency. She is currently taking vit D and denies nausea, vomiting or muscle weakness.  Insulin Resistance Patricia Lin has a diagnosis of insulin resistance based on her elevated fasting insulin level >5. Although Patricia Lin's blood glucose readings are still under good control, insulin resistance puts her at greater risk of metabolic syndrome and diabetes. We discussed metformin including benefits and risk  Iron Deficiency Anemia Patricia Lin has a diagnosis of anemia. She notes fatigue and is on iron supplementation.   ALLERGIES: Allergies  Allergen Reactions  . Latex Itching and Rash    MEDICATIONS: Current Outpatient Medications on File Prior to Visit  Medication Sig Dispense Refill  . ferrous sulfate 325 (65 FE) MG tablet Take 1 tablet (325 mg total) by mouth 3 (three) times daily with meals. 90 tablet 0  . naproxen sodium (ALEVE) 220 MG tablet Take 220 mg by mouth daily as needed.    . Vitamin D, Ergocalciferol, (DRISDOL) 50000 units CAPS capsule Take 1 capsule (50,000 Units total) by mouth every 7 (seven) days. 4 capsule 0   No current facility-administered medications on file prior to visit.     PAST MEDICAL HISTORY: Past Medical History:  Diagnosis Date  . Abnormal Pap  smear   . Anemia   . Anxiety   . Back pain   . BV (bacterial vaginosis) 2011  . Chlamydia infection   . CIN I (cervical intraepithelial neoplasia I) 2008  . Depression   . Dyspnea   . Dysrhythmia 2009   TACHYCARDIA;   HAD W/U 2010? POSSIBLE HEART VALVE ISSUE; NOT F/U NEEDED X 3 YEARS PER PT  . Dysuria 2010  . Fatigue   . H/O pre-eclampsia in prior pregnancy, currently pregnant   . H/O varicella   . H/O vitamin D deficiency   . Headache(784.0)    MIGRAINES  . HTN (hypertension)   . Hx of breast reduction, elective   . Hx: UTI (urinary tract infection)   . Infection    UTI DURING PREGNANCY  . Infection    YEAST WITH PREGNANCY  . Infection    CHLAMYDIA X 1  . Knee pain   . 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 Low iron   . PIH (pregnancy induced hypertension) 11-2012   Today pp one month.  B/P nl, and 122/76 on repeat-sitting  . Postpartum depression 2011   NO MEDS  . Pregnancy induced hypertension    ALL PREGNANCIES  . Shortness of breath    WITH EXERTION SICNE PREGNANCY  . Tachycardia   . Tachycardia 2010   HAS HAD WORKUP. UNSURE IF HAS POSSIBLE HEART VALVE  ISSUE  . Vulvitis 2009    PAST SURGICAL HISTORY: Past Surgical History:  Procedure Laterality Date  . BREAST REDUCTION SURGERY  2008  . BREAST SURGERY    . DILATION AND CURETTAGE OF UTERUS    . FINGER SURGERY     Right index finger  . TUBAL LIGATION  01/09/2013   Procedure: POST PARTUM TUBAL LIGATION;  Surgeon: Michael Litter, MD;  Location: [facility];  Service: Gynecology;  Laterality: Bilateral;  post partum tubal ligation bilateral  . US ECHOCARDIOGRAPHY  06/18/2009   ef 55-60%  . WISDOM TOOTH EXTRACTION      SOCIAL HISTORY: Social History   Tobacco Use  . Smoking status: Passive Smoke Exposure - Never Smoker  . Smokeless tobacco: Never Used  Substance Use Topics  . Alcohol use: Yes    Comment: WEEKENDS -LIQUOR;  LAST DRANK 04/03/12  . Drug  use: No    FAMILY HISTORY: Family History  Problem Relation Age of Onset  . Hypertension Mother   . Hyperlipidemia Mother   . Coronary artery disease Mother   . Heart disease Mother        high cholesterol  . Asthma Mother   . Diabetes Mother   . Depression Mother   . Anxiety disorder Mother   . Sleep apnea Mother   . Obesity Mother   . Eating disorder Mother   . Hypertension Father   . Hypertension Maternal Grandmother   . Hypertension Maternal Grandfather   . Asthma Daughter   . Alcohol abuse Maternal Uncle   . Drug abuse Maternal Uncle     ROS: Review of Systems  Constitutional: Positive for malaise/fatigue. Negative for weight loss.  Gastrointestinal: Negative for nausea and vomiting.  Musculoskeletal:       Negative for muscle weakness.  Endo/Heme/Allergies:       Positive for polyphagia.    PHYSICAL EXAM: Blood pressure 111/76, pulse 83, temperature 98.6 F (37 C), temperature source Oral, height 5\' 2"  (1.575 m), weight 187 lb (84.8 kg), SpO2 98 %. Body mass index is 34.2 kg/m. Physical Exam  Constitutional: She is oriented to person, place, and time. She appears well-developed and well-nourished.  Cardiovascular: Normal rate.  Pulmonary/Chest: Effort normal.  Musculoskeletal: Normal range of motion.  Neurological: She is oriented to person, place, and time.  Skin: Skin is warm and dry.  Psychiatric: She has a normal mood and affect. Her behavior is normal.  Vitals reviewed.   RECENT LABS AND TESTS: BMET    Component Value Date/Time   NA 136 09/08/2018 1042   K 4.1 09/08/2018 1042   CL 99 09/08/2018 1042   CO2 23 09/08/2018 1042   GLUCOSE 88 09/08/2018 1042   GLUCOSE 95 12/05/2017 0747   BUN 14 09/08/2018 1042   CREATININE 0.75 09/08/2018 1042   CREATININE 0.64 01/17/2013 1720   CALCIUM 9.8 09/08/2018 1042   GFRNONAA 101 09/08/2018 1042   GFRAA 117 09/08/2018 1042   Lab Results  Component Value Date   HGBA1C 5.2 09/08/2018   Lab Results    Component Value Date   INSULIN 11.5 09/08/2018   CBC    Component Value Date/Time   WBC 3.8 09/08/2018 1042   WBC 6.0 12/06/2017 0000   RBC 4.21 09/08/2018 1042   RBC 4.52 12/06/2017 0000   HGB 11.1 09/08/2018 1042   HCT 34.9 09/08/2018 1042   PLT 265 12/06/2017 0000   MCV 83 09/08/2018 1042   MCH 26.4 (L) 09/08/2018 1042   MCH  19.7 (L) 12/06/2017 0000   MCHC 31.8 09/08/2018 1042   MCHC 29.5 (L) 12/06/2017 0000   RDW 13.6 09/08/2018 1042   LYMPHSABS 1.6 09/08/2018 1042   MONOABS 0.3 12/04/2017 1858   EOSABS 0.0 09/08/2018 1042   BASOSABS 0.0 09/08/2018 1042   Iron/TIBC/Ferritin/ %Sat    Component Value Date/Time   IRON 32 09/08/2018 1042   TIBC 351 09/08/2018 1042   FERRITIN 9 (L) 09/08/2018 1042   IRONPCTSAT 9 (LL) 09/08/2018 1042   Lipid Panel     Component Value Date/Time   CHOL 193 09/08/2018 1042   TRIG 71 09/08/2018 1042   HDL 78 09/08/2018 1042   LDLCALC 101 (H) 09/08/2018 1042   Hepatic Function Panel     Component Value Date/Time   PROT 7.7 09/08/2018 1042   ALBUMIN 4.5 09/08/2018 1042   AST 25 09/08/2018 1042   ALT 30 09/08/2018 1042   ALKPHOS 78 09/08/2018 1042   BILITOT 0.8 09/08/2018 1042      Component Value Date/Time   TSH 0.755 09/08/2018 1042   Results for Patricia Lin, Patricia Lin (MRN [id number]) as of 10/05/2018 15:22  Ref. Range 09/08/2018 10:42  Vitamin D, 25-Hydroxy Latest Ref Range: 30.0 - 100.0 ng/mL 9.6 (L)   ASSESSMENT AND PLAN: Vitamin D deficiency  Insulin resistance  Other iron deficiency anemia  Class 1 obesity with serious comorbidity and body mass index (BMI) of 34.0 to 34.9 in adult, unspecified obesity type  PLAN:  Vitamin D Deficiency Patricia Lin was informed that low vitamin D levels contributes to fatigue and are associated with obesity, breast, and colon cancer. She agrees to continue to take prescription Vit D @50 ,000 IU every week with no refill needed and will follow up for routine testing of vitamin D, at least 2-3 times  per year. She was informed of the risk of over-replacement of vitamin D and agrees to not increase her dose unless she discusses this with [place] first. She agreed to follow up as directed.  Insulin Resistance Patricia Lin will continue to work on weight loss, exercise, and decreasing simple carbohydrates in her diet to help decrease the risk of diabetes.Patricia Lin wants to defer metformin for now and prescription was not written today. Patricia Lin agreed to continue with her diet and follow up with [place] as directed to monitor her progress in 2 weeks.  Iron Deficiency Anemia Patricia Lin agrees to continue her prescription iron per her PCP and continue with her meal plan. We discussed the importance of protein for increasing iron level.  I spent > than 50% of the 15 minute visit on counseling as documented in the note.  Obesity Patricia Lin is currently in the action stage of change. As such, her goal is to continue with weight loss efforts. She has agreed to follow the Category 3 plan. Patricia Lin agrees to move protein from dinner to daytime for a snack. Patricia Lin has been instructed to work up to a goal of 150 minutes of combined cardio and strengthening exercise per week for weight loss and overall health benefits. We discussed the following Behavioral Modification Strategies today: increasing lean protein intake and planning for success.  Patricia Lin has agreed to follow up with our clinic in 3 weeks. She was informed of the importance of frequent follow up visits to maximize her success with intensive lifestyle modifications for her multiple health conditions.   OBESITY BEHAVIORAL INTERVENTION VISIT  Today's visit was # 3   Starting weight: 191 lbs Starting date: 09/08/18 Today's weight : Weight: 187 lb (84.8  kg)  Today's date: 10/04/2018 Total lbs lost to date: 4  ASK: We discussed the diagnosis of obesity with Patricia Lin today and Patricia Lin agreed to give [place] permission to discuss obesity behavioral modification therapy  today.  ASSESS: Patricia Lin has the diagnosis of obesity and her BMI today is 34.19. Patricia Lin is in the action stage of change.   ADVISE: Patricia Lin was educated on the multiple health risks of obesity as well as the benefit of weight loss to improve her health. She was advised of the need for Patricia Lin treatment and the importance of lifestyle modifications to improve her current health and to decrease her risk of future health problems.  AGREE: Multiple dietary modification options and treatment options were discussed and Patricia Lin agreed to follow the recommendations documented in the above note.  ARRANGE: Adea was educated on the importance of frequent visits to treat obesity as outlined per CMS and USPSTF guidelines and agreed to schedule her next follow up appointment today.  I, Kirke Corin, am acting as Energy manager for [place], FNP  I have reviewed the above documentation for accuracy and completeness, and I agree with the above. [place], FNP-C.

## 2018-10-07 ENCOUNTER — Encounter (HOSPITAL_COMMUNITY): Payer: Self-pay | Admitting: Psychiatry

## 2018-10-07 ENCOUNTER — Ambulatory Visit (INDEPENDENT_AMBULATORY_CARE_PROVIDER_SITE_OTHER): Payer: PRIVATE HEALTH INSURANCE | Admitting: Psychiatry

## 2018-10-07 VITALS — BP 130/85 | HR 86 | Ht 62.0 in | Wt 192.0 lb

## 2018-10-07 DIAGNOSIS — F330 Major depressive disorder, recurrent, mild: Secondary | ICD-10-CM

## 2018-10-07 DIAGNOSIS — F411 Generalized anxiety disorder: Secondary | ICD-10-CM

## 2018-10-07 MED ORDER — FLUOXETINE HCL 10 MG PO CAPS: ORAL_CAPSULE | ORAL | 2 refills | Status: DC

## 2018-10-07 NOTE — Progress Notes (Signed)
BH MD/PA/NP OP Progress Note  10/07/2018 4:31 PM Patricia Lin  MRN:  [id number]  Chief Complaint: I took 1 pill of hydroxyzine and it make me sleepy I stopped.  I cannot afford Prozac.  HPI: Patient came for her follow-up appointment.  She is a 37 year old African-American married employed female who was seen first time 4 weeks ago as a referral from intensive outpatient program.  She is employed at [employer].  She reported her job is very stressful and she is not looking for other job.  We have prescribed hydroxyzine and Prozac.  She took only 1 capsule of hydroxyzine and after she felt very dizzy and sleepy and decided to stop it.  She could not afford Prozac because it was costing her more than $20.  I explained it is $4 and most of the pharmacy.  Patient like to get a hard copy so she can try going to [place] and other pharmacies.  She still feel depressed, anxious, having crying spells and sometimes feeling of hopelessness and worthlessness.  She has no tremors or shakes.  She has panic attacks headaches but denies any suicidal thoughts or homicidal thought.  She was taking Remeron but she was poorly compliant due to weight gain.  Patient denies hallucination or any paranoia.  She is seeing Lavada Mesi for therapy.  Patient denies drinking or using any illegal substances.  Visit Diagnosis:    ICD-10-CM   1. GAD (generalized anxiety disorder) F41.1 FLUoxetine (PROZAC) 10 MG capsule  2. MDD (major depressive disorder), recurrent episode, mild (HCC) F33.0     Past Psychiatric History: Reviewed. Patient denies any history of psychiatric inpatient treatment or any suicidal attempt.  In the past she took Zoloft in her 81s but stopped due to sexual side effects.  She did intensive outpatient program in June 2019 and prescribed Remeron but stopped due to weight gain.  Past Medical History:  Past Medical History:  Diagnosis Date  . Abnormal Pap smear   . Anemia   . Anxiety   . Back pain   . BV  (bacterial vaginosis) 2011  . Chlamydia infection   . CIN I (cervical intraepithelial neoplasia I) 2008  . Depression   . Dyspnea   . Dysrhythmia 2009   TACHYCARDIA;   HAD W/U 2010? POSSIBLE HEART VALVE ISSUE; NOT F/U NEEDED X 3 YEARS PER PT  . Dysuria 2010  . Fatigue   . H/O pre-eclampsia in prior pregnancy, currently pregnant   . H/O varicella   . H/O vitamin D deficiency   . Headache(784.0)    MIGRAINES  . HTN (hypertension)   . Hx of breast reduction, elective   . Hx: UTI (urinary tract infection)   . Infection    UTI DURING PREGNANCY  . Infection    YEAST WITH PREGNANCY  . Infection    CHLAMYDIA X 1  . Knee pain   . 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 Low iron   . PIH (pregnancy induced hypertension) 11-2012   Today pp one month.  B/P nl, and 122/76 on repeat-sitting  . Postpartum depression 2011   NO MEDS  . Pregnancy induced hypertension    ALL PREGNANCIES  . Shortness of breath    WITH EXERTION SICNE PREGNANCY  . Tachycardia   . Tachycardia 2010   HAS HAD WORKUP. UNSURE IF HAS POSSIBLE HEART VALVE ISSUE  . Vulvitis 2009    Past Surgical History:  Procedure Laterality  Date  . BREAST REDUCTION SURGERY  2008  . BREAST SURGERY    . DILATION AND CURETTAGE OF UTERUS    . FINGER SURGERY     Right index finger  . TUBAL LIGATION  01/09/2013   Procedure: POST PARTUM TUBAL LIGATION;  Surgeon: Michael Litter, MD;  Location: [facility];  Service: Gynecology;  Laterality: Bilateral;  post partum tubal ligation bilateral  . US ECHOCARDIOGRAPHY  06/18/2009   ef 55-60%  . WISDOM TOOTH EXTRACTION      Family Psychiatric History: Reviewed.  Family History:  Family History  Problem Relation Age of Onset  . Hypertension Mother   . Hyperlipidemia Mother   . Coronary artery disease Mother   . Heart disease Mother        high cholesterol  . Asthma Mother   . Diabetes Mother   . Depression Mother   . Anxiety  disorder Mother   . Sleep apnea Mother   . Obesity Mother   . Eating disorder Mother   . Hypertension Father   . Hypertension Maternal Grandmother   . Hypertension Maternal Grandfather   . Asthma Daughter   . Alcohol abuse Maternal Uncle   . Drug abuse Maternal Uncle     Social History:  Social History   Socioeconomic History  . Marital status: Single    Spouse name: Jerl Santos  . Number of children: 3  . Years of education: 62  . Highest education level: Not on file  Occupational History  . Occupation: Physiological scientist: [employer]  Social Needs  . Financial resource strain: Not on file  . Food insecurity:    Worry: Not on file    Inability: Not on file  . Transportation needs:    Medical: Not on file    Non-medical: Not on file  Tobacco Use  . Smoking status: Passive Smoke Exposure - Never Smoker  . Smokeless tobacco: Never Used  Substance and Sexual Activity  . Alcohol use: Yes    Comment: WEEKENDS -LIQUOR;  LAST DRANK 04/03/12  . Drug use: No  . Sexual activity: Not Currently    Partners: Male    Birth control/protection: None  Lifestyle  . Physical activity:    Days per week: Not on file    Minutes per session: Not on file  . Stress: Not on file  Relationships  . Social connections:    Talks on phone: Not on file    Gets together: Not on file    Attends religious service: Not on file    Active member of club or organization: Not on file    Attends meetings of clubs or organizations: Not on file    Relationship status: Not on file  Other Topics Concern  . Not on file  Social History Narrative  . Not on file    Allergies:  Allergies  Allergen Reactions  . Latex Itching and Rash    Metabolic Disorder Labs: Lab Results  Component Value Date   HGBA1C 5.2 09/08/2018   No results found for: PROLACTIN Lab Results  Component Value Date   CHOL 193 09/08/2018   TRIG 71 09/08/2018   HDL 78 09/08/2018   LDLCALC 101 (H) 09/08/2018    Lab Results  Component Value Date   TSH 0.755 09/08/2018    Therapeutic Level Labs: No results found for: LITHIUM No results found for: VALPROATE No components found for:  CBMZ  Current Medications: Current Outpatient Medications  Medication Sig Dispense Refill  . ferrous sulfate 325 (65 FE) MG tablet Take 1 tablet (325 mg total) by mouth 3 (three) times daily with meals. 90 tablet 0  . naproxen sodium (ALEVE) 220 MG tablet Take 220 mg by mouth daily as needed.    . Vitamin D, Ergocalciferol, (DRISDOL) 50000 units CAPS capsule Take 1 capsule (50,000 Units total) by mouth every 7 (seven) days. 4 capsule 0   No current facility-administered medications for this visit.      Musculoskeletal: Strength & Muscle Tone: within normal limits Gait & Station: normal Patient leans: N/A  Psychiatric Specialty Exam: ROS  Blood pressure 130/85, pulse 86, height 5\' 2"  (1.575 m), weight 192 lb (87.1 kg), SpO2 100 %.Body mass index is 35.12 kg/m.  General Appearance: Casual  Eye Contact:  Good  Speech:  Normal Rate  Volume:  Normal  Mood:  Dysphoric  Affect:  Congruent  Thought Process:  Goal Directed  Orientation:  Full (Time, Place, and Person)  Thought Content: Logical   Suicidal Thoughts:  No  Homicidal Thoughts:  No  Memory:  Immediate;   Good Recent;   Good Remote;   Good  Judgement:  Fair  Insight:  Fair  Psychomotor Activity:  Normal  Concentration:  Concentration: Fair and Attention Span: Fair  Recall:  Good  Fund of Knowledge: Good  Language: Good  Akathisia:  No  Handed:  Right  AIMS (if indicated): not done  Assets:  Communication Skills Desire for Improvement Housing  ADL's:  Intact  Cognition: WNL  Sleep:  Fair   Screenings: PHQ2-9     Office Visit from 09/08/2018 in [facility]  PHQ-2 Total Score  4  PHQ-9 Total Score  14       Assessment and Plan: Major depressive disorder, recurrent.  Generalized anxiety disorder.  Rule out  PTSD.  Patient unable to afford Prozac even though she has insurance and she is able to get for only $4 at different pharmacy.  She also stopped hydroxyzine after taking only 1 pill that makes her dizzy.  Patient is reluctant to take any medication that makes her sleepy.  She agreed to take Prozac but will require a hard prescript copy so she can try going to the pharmacy to get refill.  I encouraged to continue therapy with Lavada Mesi.  Discussed safety concerns at any time having active suicidal thoughts or homicidal thought that she need to call 911 or go to local emergency room.  I also suggested that if she has trouble getting the prescription then she should call us immediately.  Follow-up in 6 weeks.     Cleotis Nipper, MD 10/07/2018, 4:31 PM

## 2018-10-21 ENCOUNTER — Ambulatory Visit (INDEPENDENT_AMBULATORY_CARE_PROVIDER_SITE_OTHER): Payer: 59 | Admitting: Family Medicine

## 2018-10-26 ENCOUNTER — Ambulatory Visit (INDEPENDENT_AMBULATORY_CARE_PROVIDER_SITE_OTHER): Payer: 59 | Admitting: Family Medicine

## 2018-10-26 VITALS — BP 110/77 | HR 77 | Temp 98.1°F | Ht 62.0 in | Wt 184.0 lb

## 2018-10-26 DIAGNOSIS — E669 Obesity, unspecified: Secondary | ICD-10-CM

## 2018-10-26 DIAGNOSIS — E559 Vitamin D deficiency, unspecified: Secondary | ICD-10-CM | POA: Diagnosis not present

## 2018-10-26 DIAGNOSIS — Z6833 Body mass index (BMI) 33.0-33.9, adult: Secondary | ICD-10-CM

## 2018-10-26 DIAGNOSIS — Z9189 Other specified personal risk factors, not elsewhere classified: Secondary | ICD-10-CM | POA: Diagnosis not present

## 2018-10-26 MED ORDER — VITAMIN D (ERGOCALCIFEROL) 1.25 MG (50000 UNIT) PO CAPS: 50000.0000 [IU] | ORAL_CAPSULE | ORAL | 0 refills | Status: DC

## 2018-10-27 NOTE — Progress Notes (Signed)
Office: [phone]  /  Fax: [phone]   HPI:   Chief Complaint: OBESITY Patricia Lin is here to discuss her progress with her obesity treatment plan. She is on the Category 3 plan and is following her eating plan approximately 50 % of the time. She states she is exercising 0 minutes 0 times per week. Patricia Lin continues to do well with the weight loss, but she is bored with her Category 3 plan and she would like to look at other options. Her weight is 184 lb (83.5 kg) today and has had a weight loss of 3 pounds over a period of 3 weeks since her last visit. She has lost 7 lbs since starting treatment with us.  Vitamin D deficiency Patricia Lin has a diagnosis of vitamin D deficiency. She is stable on vit D and denies nausea, vomiting or muscle weakness.  At risk for osteopenia and osteoporosis Patricia Lin is at higher risk of osteopenia and osteoporosis due to vitamin D deficiency.   ALLERGIES: Allergies  Allergen Reactions  . Latex Itching and Rash    MEDICATIONS: Current Outpatient Medications on File Prior to Visit  Medication Sig Dispense Refill  . ferrous sulfate 325 (65 FE) MG tablet Take 1 tablet (325 mg total) by mouth 3 (three) times daily with meals. 90 tablet 0  . FLUoxetine (PROZAC) 10 MG capsule Take one capsule daily for one week and than 2 capsule daily 60 capsule 2  . naproxen sodium (ALEVE) 220 MG tablet Take 220 mg by mouth daily as needed.     No current facility-administered medications on file prior to visit.     PAST MEDICAL HISTORY: Past Medical History:  Diagnosis Date  . Abnormal Pap smear   . Anemia   . Anxiety   . Back pain   . BV (bacterial vaginosis) 2011  . Chlamydia infection   . CIN I (cervical intraepithelial neoplasia I) 2008  . Depression   . Dyspnea   . Dysrhythmia 2009   TACHYCARDIA;   HAD W/U 2010? POSSIBLE HEART VALVE ISSUE; NOT F/U NEEDED X 3 YEARS PER PT  . Dysuria 2010  . Fatigue   . H/O pre-eclampsia in prior pregnancy, currently  pregnant   . H/O varicella   . H/O vitamin D deficiency   . Headache(784.0)    MIGRAINES  . HTN (hypertension)   . Hx of breast reduction, elective   . Hx: UTI (urinary tract infection)   . Infection    UTI DURING PREGNANCY  . Infection    YEAST WITH PREGNANCY  . Infection    CHLAMYDIA X 1  . Knee pain   . 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 Low iron   . PIH (pregnancy induced hypertension) 11-2012   Today pp one month.  B/P nl, and 122/76 on repeat-sitting  . Postpartum depression 2011   NO MEDS  . Pregnancy induced hypertension    ALL PREGNANCIES  . Shortness of breath    WITH EXERTION SICNE PREGNANCY  . Tachycardia   . Tachycardia 2010   HAS HAD WORKUP. UNSURE IF HAS POSSIBLE HEART VALVE ISSUE  . Vulvitis 2009    PAST SURGICAL HISTORY: Past Surgical History:  Procedure Laterality Date  . BREAST REDUCTION SURGERY  2008  . BREAST SURGERY    . DILATION AND CURETTAGE OF UTERUS    . FINGER SURGERY     Right index finger  . TUBAL LIGATION  01/09/2013  Procedure: POST PARTUM TUBAL LIGATION;  Surgeon: Michael Litter, MD;  Location: [facility];  Service: Gynecology;  Laterality: Bilateral;  post partum tubal ligation bilateral  . US ECHOCARDIOGRAPHY  06/18/2009   ef 55-60%  . WISDOM TOOTH EXTRACTION      SOCIAL HISTORY: Social History   Tobacco Use  . Smoking status: Passive Smoke Exposure - Never Smoker  . Smokeless tobacco: Never Used  Substance Use Topics  . Alcohol use: Yes    Comment: WEEKENDS -LIQUOR;  LAST DRANK 04/03/12  . Drug use: No    FAMILY HISTORY: Family History  Problem Relation Age of Onset  . Hypertension Mother   . Hyperlipidemia Mother   . Coronary artery disease Mother   . Heart disease Mother        high cholesterol  . Asthma Mother   . Diabetes Mother   . Depression Mother   . Anxiety disorder Mother   . Sleep apnea Mother   . Obesity Mother   . Eating disorder Mother   .  Hypertension Father   . Hypertension Maternal Grandmother   . Hypertension Maternal Grandfather   . Asthma Daughter   . Alcohol abuse Maternal Uncle   . Drug abuse Maternal Uncle     ROS: Review of Systems  Constitutional: Positive for weight loss.  Gastrointestinal: Negative for nausea and vomiting.  Musculoskeletal:       Negative for muscle weakness    PHYSICAL EXAM: Blood pressure 110/77, pulse 77, temperature 98.1 F (36.7 C), temperature source Oral, height 5\' 2"  (1.575 m), weight 184 lb (83.5 kg), last menstrual period 10/21/2018, SpO2 100 %. Body mass index is 33.65 kg/m. Physical Exam  Constitutional: She is oriented to person, place, and time. She appears well-developed and well-nourished.  Cardiovascular: Normal rate.  Pulmonary/Chest: Effort normal.  Musculoskeletal: Normal range of motion.  Neurological: She is oriented to person, place, and time.  Skin: Skin is warm and dry.  Psychiatric: She has a normal mood and affect. Her behavior is normal.  Vitals reviewed.   RECENT LABS AND TESTS: BMET    Component Value Date/Time   NA 136 09/08/2018 1042   K 4.1 09/08/2018 1042   CL 99 09/08/2018 1042   CO2 23 09/08/2018 1042   GLUCOSE 88 09/08/2018 1042   GLUCOSE 95 12/05/2017 0747   BUN 14 09/08/2018 1042   CREATININE 0.75 09/08/2018 1042   CREATININE 0.64 01/17/2013 1720   CALCIUM 9.8 09/08/2018 1042   GFRNONAA 101 09/08/2018 1042   GFRAA 117 09/08/2018 1042   Lab Results  Component Value Date   HGBA1C 5.2 09/08/2018   Lab Results  Component Value Date   INSULIN 11.5 09/08/2018   CBC    Component Value Date/Time   WBC 3.8 09/08/2018 1042   WBC 6.0 12/06/2017 0000   RBC 4.21 09/08/2018 1042   RBC 4.52 12/06/2017 0000   HGB 11.1 09/08/2018 1042   HCT 34.9 09/08/2018 1042   PLT 265 12/06/2017 0000   MCV 83 09/08/2018 1042   MCH 26.4 (L) 09/08/2018 1042   MCH 19.7 (L) 12/06/2017 0000   MCHC 31.8 09/08/2018 1042   MCHC 29.5 (L) 12/06/2017 0000    RDW 13.6 09/08/2018 1042   LYMPHSABS 1.6 09/08/2018 1042   MONOABS 0.3 12/04/2017 1858   EOSABS 0.0 09/08/2018 1042   BASOSABS 0.0 09/08/2018 1042   Iron/TIBC/Ferritin/ %Sat    Component Value Date/Time   IRON 32 09/08/2018 1042   TIBC 351 09/08/2018 1042  FERRITIN 9 (L) 09/08/2018 1042   IRONPCTSAT 9 (LL) 09/08/2018 1042   Lipid Panel     Component Value Date/Time   CHOL 193 09/08/2018 1042   TRIG 71 09/08/2018 1042   HDL 78 09/08/2018 1042   LDLCALC 101 (H) 09/08/2018 1042   Hepatic Function Panel     Component Value Date/Time   PROT 7.7 09/08/2018 1042   ALBUMIN 4.5 09/08/2018 1042   AST 25 09/08/2018 1042   ALT 30 09/08/2018 1042   ALKPHOS 78 09/08/2018 1042   BILITOT 0.8 09/08/2018 1042      Component Value Date/Time   TSH 0.755 09/08/2018 1042  Results for Krejci, Tyffani (MRN [id number]) as of 10/27/2018 12:42  Ref. Range 09/08/2018 10:42  Vitamin D, 25-Hydroxy Latest Ref Range: 30.0 - 100.0 ng/mL 9.6 (L)    ASSESSMENT AND PLAN: Vitamin D deficiency - Plan: Vitamin D, Ergocalciferol, (DRISDOL) 1.25 MG (50000 UT) CAPS capsule  At risk for osteoporosis  Class 1 obesity with serious comorbidity and body mass index (BMI) of 33.0 to 33.9 in adult, unspecified obesity type  PLAN:  Vitamin D Deficiency Patricia Lin was informed that low vitamin D levels contributes to fatigue and are associated with obesity, breast, and colon cancer. She agrees to continue to take prescription Vit D @50 ,000 IU every week #4 with no refills and will follow up for routine testing of vitamin D, at least 2-3 times per year. She was informed of the risk of over-replacement of vitamin D and agrees to not increase her dose unless she discusses this with [place] first. Patricia Lin agrees to follow up with our clinic in 3 weeks.  At risk for osteopenia and osteoporosis Patricia Lin was given extended  (15 minutes) osteoporosis prevention counseling today. Patricia Lin is at risk for osteopenia and osteoporosis due to  her vitamin D deficiency. She was encouraged to take her vitamin D and follow her higher calcium diet and increase strengthening exercise to help strengthen her bones and decrease her risk of osteopenia and osteoporosis.  Obesity Patricia Lin is currently in the action stage of change. As such, her goal is to continue with weight loss efforts We discussed other options but Patricia Lin wants to stay with the Category 3 plan Patricia Lin has been instructed to work up to a goal of 150 minutes of combined cardio and strengthening exercise per week for weight loss and overall health benefits. We discussed the following Behavioral Modification Strategies today: increasing lean protein intake, decreasing simple carbohydrates , work on meal planning and easy cooking plans and holiday eating strategies   Patricia Lin has agreed to follow up with our clinic in 3 weeks. She was informed of the importance of frequent follow up visits to maximize her success with intensive lifestyle modifications for her multiple health conditions.   OBESITY BEHAVIORAL INTERVENTION VISIT  Today's visit was # 4   Starting weight: 191 lbs Starting date: 09/08/2018 Today's weight :  184 lbs Today's date: 10/26/2018 Total lbs lost to date: 7   ASK: We discussed the diagnosis of obesity with Patricia Lin today and Patricia Lin agreed to give [place] permission to discuss obesity behavioral modification therapy today.  ASSESS: Patricia Lin has the diagnosis of obesity and her BMI today is 33.65 Patricia Lin is in the action stage of change   ADVISE: Mannie was educated on the multiple health risks of obesity as well as the benefit of weight loss to improve her health. She was advised of the need for Patricia Lin term treatment and the importance of lifestyle  modifications to improve her current health and to decrease her risk of future health problems.  AGREE: Multiple dietary modification options and treatment options were discussed and  Patricia Lin agreed to follow the  recommendations documented in the above note.  ARRANGE: Patricia Lin was educated on the importance of frequent visits to treat obesity as outlined per CMS and USPSTF guidelines and agreed to schedule her next follow up appointment today.  I, Nevada Crane, am acting as transcriptionist for Quillian Quince, MD  I have reviewed the above documentation for accuracy and completeness, and I agree with the above. -Quillian Quince, MD

## 2018-11-16 ENCOUNTER — Encounter (INDEPENDENT_AMBULATORY_CARE_PROVIDER_SITE_OTHER): Payer: Self-pay | Admitting: Family Medicine

## 2018-11-16 ENCOUNTER — Ambulatory Visit (INDEPENDENT_AMBULATORY_CARE_PROVIDER_SITE_OTHER): Payer: 59 | Admitting: Family Medicine

## 2018-11-16 VITALS — BP 115/81 | HR 89 | Ht 62.0 in | Wt 184.0 lb

## 2018-11-16 DIAGNOSIS — E669 Obesity, unspecified: Secondary | ICD-10-CM | POA: Diagnosis not present

## 2018-11-16 DIAGNOSIS — Z9189 Other specified personal risk factors, not elsewhere classified: Secondary | ICD-10-CM | POA: Diagnosis not present

## 2018-11-16 DIAGNOSIS — Z6833 Body mass index (BMI) 33.0-33.9, adult: Secondary | ICD-10-CM | POA: Diagnosis not present

## 2018-11-16 DIAGNOSIS — E66811 Obesity, class 1: Secondary | ICD-10-CM

## 2018-11-16 DIAGNOSIS — E559 Vitamin D deficiency, unspecified: Secondary | ICD-10-CM

## 2018-11-16 MED ORDER — VITAMIN D (ERGOCALCIFEROL) 1.25 MG (50000 UNIT) PO CAPS: 50000.0000 [IU] | ORAL_CAPSULE | ORAL | 0 refills | Status: DC

## 2018-11-17 ENCOUNTER — Ambulatory Visit (HOSPITAL_COMMUNITY): Payer: 59 | Admitting: Psychiatry

## 2018-11-17 NOTE — Progress Notes (Signed)
Office: [phone]  /  Fax: [phone]   HPI:   Chief Complaint: OBESITY Patricia Lin is here to discuss her progress with her obesity treatment plan. She is on the Category 3 plan and is following her eating plan approximately 0 % of the time. She states she is exercising 0 minutes 0 times per week. Patricia Lin has had increased celebration eating and has been off track with her diet prescription, but she has still been mindful and tried to make good choices. She did well maintaining weight over Thanksgiving.  Her weight is 184 lb (83.5 kg) today and has not lost weight since her last visit. She has lost 7 lbs since starting treatment with Patricia Lin.  Vitamin D deficiency Patricia Lin has a diagnosis of vitamin D deficiency. She is currently taking vit D and is stable, but not yet at goal. She denies nausea, vomiting, or muscle weakness.  At risk for osteopenia and osteoporosis Patricia Lin is at higher risk of osteopenia and osteoporosis due to vitamin D deficiency.   ALLERGIES: Allergies  Allergen Reactions  . Latex Itching and Rash    MEDICATIONS: Current Outpatient Medications on File Prior to Visit  Medication Sig Dispense Refill  . ferrous sulfate 325 (65 FE) MG tablet Take 1 tablet (325 mg total) by mouth 3 (three) times daily with meals. 90 tablet 0  . naproxen sodium (ALEVE) 220 MG tablet Take 220 mg by mouth daily as needed.     No current facility-administered medications on file prior to visit.     PAST MEDICAL HISTORY: Past Medical History:  Diagnosis Date  . Abnormal Pap smear   . Anemia   . Anxiety   . Back pain   . BV (bacterial vaginosis) 2011  . Chlamydia infection   . CIN I (cervical intraepithelial neoplasia I) 2008  . Depression   . Dyspnea   . Dysrhythmia 2009   TACHYCARDIA;   HAD W/U 2010? POSSIBLE HEART VALVE ISSUE; NOT F/U NEEDED X 3 YEARS PER PT  . Dysuria 2010  . Fatigue   . H/O pre-eclampsia in prior pregnancy, currently pregnant   . H/O varicella   . H/O  vitamin D deficiency   . Headache(784.0)    MIGRAINES  . HTN (hypertension)   . Hx of breast reduction, elective   . Hx: UTI (urinary tract infection)   . Infection    UTI DURING PREGNANCY  . Infection    YEAST WITH PREGNANCY  . Infection    CHLAMYDIA X 1  . Knee pain   . 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 Low iron   . PIH (pregnancy induced hypertension) 11-2012   Today pp one month.  B/P nl, and 122/76 on repeat-sitting  . Postpartum depression 2011   NO MEDS  . Pregnancy induced hypertension    ALL PREGNANCIES  . Shortness of breath    WITH EXERTION SICNE PREGNANCY  . Tachycardia   . Tachycardia 2010   HAS HAD WORKUP. UNSURE IF HAS POSSIBLE HEART VALVE ISSUE  . Vulvitis 2009    PAST SURGICAL HISTORY: Past Surgical History:  Procedure Laterality Date  . BREAST REDUCTION SURGERY  2008  . BREAST SURGERY    . DILATION AND CURETTAGE OF UTERUS    . FINGER SURGERY     Right index finger  . TUBAL LIGATION  01/09/2013   Procedure: POST PARTUM TUBAL LIGATION;  Surgeon: Michael Litter, MD;  Location: [facility];  Service: Gynecology;  Laterality: Bilateral;  post partum tubal ligation bilateral  . US ECHOCARDIOGRAPHY  06/18/2009   ef 55-60%  . WISDOM TOOTH EXTRACTION      SOCIAL HISTORY: Social History   Tobacco Use  . Smoking status: Passive Smoke Exposure - Never Smoker  . Smokeless tobacco: Never Used  Substance Use Topics  . Alcohol use: Yes    Comment: WEEKENDS -LIQUOR;  LAST DRANK 04/03/12  . Drug use: No    FAMILY HISTORY: Family History  Problem Relation Age of Onset  . Hypertension Mother   . Hyperlipidemia Mother   . Coronary artery disease Mother   . Heart disease Mother        high cholesterol  . Asthma Mother   . Diabetes Mother   . Depression Mother   . Anxiety disorder Mother   . Sleep apnea Mother   . Obesity Mother   . Eating disorder Mother   . Hypertension Father   . Hypertension  Maternal Grandmother   . Hypertension Maternal Grandfather   . Asthma Daughter   . Alcohol abuse Maternal Uncle   . Drug abuse Maternal Uncle     ROS: ROS  PHYSICAL EXAM: Blood pressure 115/81, pulse 89, height 5\' 2"  (1.575 m), weight 184 lb (83.5 kg), last menstrual period 10/21/2018, SpO2 99 %. Body mass index is 33.65 kg/m. Physical Exam  RECENT LABS AND TESTS: BMET    Component Value Date/Time   NA 136 09/08/2018 1042   K 4.1 09/08/2018 1042   CL 99 09/08/2018 1042   CO2 23 09/08/2018 1042   GLUCOSE 88 09/08/2018 1042   GLUCOSE 95 12/05/2017 0747   BUN 14 09/08/2018 1042   CREATININE 0.75 09/08/2018 1042   CREATININE 0.64 01/17/2013 1720   CALCIUM 9.8 09/08/2018 1042   GFRNONAA 101 09/08/2018 1042   GFRAA 117 09/08/2018 1042   Lab Results  Component Value Date   HGBA1C 5.2 09/08/2018   Lab Results  Component Value Date   INSULIN 11.5 09/08/2018   CBC    Component Value Date/Time   WBC 3.8 09/08/2018 1042   WBC 6.0 12/06/2017 0000   RBC 4.21 09/08/2018 1042   RBC 4.52 12/06/2017 0000   HGB 11.1 09/08/2018 1042   HCT 34.9 09/08/2018 1042   PLT 265 12/06/2017 0000   MCV 83 09/08/2018 1042   MCH 26.4 (L) 09/08/2018 1042   MCH 19.7 (L) 12/06/2017 0000   MCHC 31.8 09/08/2018 1042   MCHC 29.5 (L) 12/06/2017 0000   RDW 13.6 09/08/2018 1042   LYMPHSABS 1.6 09/08/2018 1042   MONOABS 0.3 12/04/2017 1858   EOSABS 0.0 09/08/2018 1042   BASOSABS 0.0 09/08/2018 1042   Iron/TIBC/Ferritin/ %Sat    Component Value Date/Time   IRON 32 09/08/2018 1042   TIBC 351 09/08/2018 1042   FERRITIN 9 (L) 09/08/2018 1042   IRONPCTSAT 9 (LL) 09/08/2018 1042   Lipid Panel     Component Value Date/Time   CHOL 193 09/08/2018 1042   TRIG 71 09/08/2018 1042   HDL 78 09/08/2018 1042   LDLCALC 101 (H) 09/08/2018 1042   Hepatic Function Panel     Component Value Date/Time   PROT 7.7 09/08/2018 1042   ALBUMIN 4.5 09/08/2018 1042   AST 25 09/08/2018 1042   ALT 30  09/08/2018 1042   ALKPHOS 78 09/08/2018 1042   BILITOT 0.8 09/08/2018 1042      Component Value Date/Time   TSH 0.755 09/08/2018 1042   Results for Bhargava,  Lin (MRN [id number]) as of 11/17/2018 07:29  Ref. Range 09/08/2018 10:42  Vitamin D, 25-Hydroxy Latest Ref Range: 30.0 - 100.0 ng/mL 9.6 (L)   ASSESSMENT AND PLAN: Vitamin D deficiency - Plan: Vitamin D, Ergocalciferol, (DRISDOL) 1.25 MG (50000 UT) CAPS capsule  At risk for osteoporosis  Class 1 obesity with serious comorbidity and body mass index (BMI) of 33.0 to 33.9 in adult, unspecified obesity type  PLAN:  Vitamin D Deficiency Belmira was informed that low vitamin D levels contributes to fatigue and are associated with obesity, breast, and colon cancer. She agrees to continue to take prescription Vit D @50 ,000 IU every week #4 with no refills and will follow up for routine testing of vitamin D, at least 2-3 times per year. She was informed of the risk of over-replacement of vitamin D and agrees to not increase her dose unless she discusses this with Patricia Lin first. Kimberlyann agrees to follow up in 3 to 4 weeks.  At risk for osteopenia and osteoporosis Gabriel was given extended (15 minutes) osteoporosis prevention counseling today. Lasundra is at risk for osteopenia and osteoporosis due to her vitamin D deficiency. She was encouraged to take her vitamin D and follow her higher calcium diet and increase strengthening exercise to help strengthen her bones and decrease her risk of osteopenia and osteoporosis.  Obesity [name] is currently in the action stage of change. As such, her goal is to continue with weight loss efforts. She has agreed to follow the Category 3 plan. [name] has been instructed to work up to a goal of 150 minutes of combined cardio and strengthening exercise per week for weight loss and overall health benefits. We discussed the following Behavioral Modification Strategies today: increasing lean protein intake, decreasing  simple carbohydrates, and work on meal planning and easy cooking plans.  Mollye has agreed to follow up with our clinic in 3 to 4 weeks. She was informed of the importance of frequent follow up visits to maximize her success with intensive lifestyle modifications for her multiple health conditions.   OBESITY BEHAVIORAL INTERVENTION VISIT  Today's visit was # 5   Starting weight: 191 lbs Starting date: 09/08/18 Today's weight : Weight: 184 lb (83.5 kg)  Today's date: 11/16/2018 Total lbs lost to date: 7  ASK: We discussed the diagnosis of obesity with Courtne Soucek today and Winnifred agreed to give Patricia Lin permission to discuss obesity behavioral modification therapy today.  ASSESS: Tristyn has the diagnosis of obesity and her BMI today is 33.6 Akaila is in the action stage of change.  ADVISE: Alaysiah was educated on the multiple health risks of obesity as well as the benefit of weight loss to improve her health. She was advised of the need for [facility] term treatment and the importance of lifestyle modifications to improve her current health and to decrease her risk of future health problems.  AGREE: Multiple dietary modification options and treatment options were discussed and Tessy agreed to follow the recommendations documented in the above note.  ARRANGE: Kylani was educated on the importance of frequent visits to treat obesity as outlined per CMS and USPSTF guidelines and agreed to schedule her next follow up appointment today.  I, Kirke Corin, am acting as transcriptionist for Wilder Glade, MD  I have reviewed the above documentation for accuracy and completeness, and I agree with the above. -Quillian Quince, MD

## 2018-12-14 ENCOUNTER — Encounter (INDEPENDENT_AMBULATORY_CARE_PROVIDER_SITE_OTHER): Payer: Self-pay | Admitting: Family Medicine

## 2018-12-14 ENCOUNTER — Ambulatory Visit (INDEPENDENT_AMBULATORY_CARE_PROVIDER_SITE_OTHER): Payer: 59 | Admitting: Family Medicine

## 2018-12-14 VITALS — BP 118/78 | HR 75 | Temp 97.8°F | Ht 62.0 in | Wt 185.0 lb

## 2018-12-14 DIAGNOSIS — E669 Obesity, unspecified: Secondary | ICD-10-CM

## 2018-12-14 DIAGNOSIS — Z9189 Other specified personal risk factors, not elsewhere classified: Secondary | ICD-10-CM

## 2018-12-14 DIAGNOSIS — E559 Vitamin D deficiency, unspecified: Secondary | ICD-10-CM | POA: Diagnosis not present

## 2018-12-14 DIAGNOSIS — Z6833 Body mass index (BMI) 33.0-33.9, adult: Secondary | ICD-10-CM | POA: Diagnosis not present

## 2018-12-14 MED ORDER — VITAMIN D (ERGOCALCIFEROL) 1.25 MG (50000 UNIT) PO CAPS: 50000.0000 [IU] | ORAL_CAPSULE | ORAL | 0 refills | Status: DC

## 2018-12-15 NOTE — Progress Notes (Signed)
Office: [phone]  /  Fax: [phone]   HPI:   Chief Complaint: OBESITY Patricia Lin is here to discuss her progress with her obesity treatment plan. She is on the Category 3 plan and is following her eating plan approximately 0 % of the time. She states she is exercising 0 minutes 0 times per week. Patricia Lin did well minimizing weight gain over the holidays. She notes that family is not supportive of eating healthy which makes things more difficult for her. She is ready to get back on track.  Her weight is 185 lb (83.9 kg) today and has had a weight gain of 1 pound over a period of 4 weeks since her last visit. She has lost 6 lbs since starting treatment with [place].  Vitamin D deficiency Patricia Lin has a diagnosis of vitamin D deficiency. She is currently stable on vit D, but not yet at goal. She denies nausea, vomiting, or muscle weakness.  At risk for osteopenia and osteoporosis Patricia Lin is at higher risk of osteopenia and osteoporosis due to vitamin D deficiency.   ASSESSMENT AND PLAN:  Vitamin D deficiency - Plan: Vitamin D, Ergocalciferol, (DRISDOL) 1.25 MG (50000 UT) CAPS capsule  At risk for osteoporosis  Class 1 obesity with serious comorbidity and body mass index (BMI) of 33.0 to 33.9 in adult, unspecified obesity type  PLAN:  Vitamin D Deficiency Patricia Lin was informed that low vitamin D levels contributes to fatigue and are associated with obesity, breast, and colon cancer. She agrees to continue to take prescription Vit D @50 ,000 IU every week #4 with no refills and will follow up for routine testing of vitamin D, at least 2-3 times per year. She was informed of the risk of over-replacement of vitamin D and agrees to not increase her dose unless she discusses this with [place] first. We will check her labs in 1 month. Patricia Lin agrees to follow up in 2 weeks.  At risk for osteopenia and osteoporosis Patricia Lin was given extended (15 minutes) osteoporosis prevention counseling today. Patricia Lin is at  risk for osteopenia and osteoporosis due to her vitamin D deficiency. She was encouraged to take her vitamin D and follow her higher calcium diet and increase strengthening exercise to help strengthen her bones and decrease her risk of osteopenia and osteoporosis.  Obesity Patricia Lin is currently in the action stage of change. As such, her goal is to continue with weight loss efforts. She has agreed to follow the Category 3 plan. Patricia Lin has been instructed to work up to a goal of 150 minutes of combined cardio and strengthening exercise per week for weight loss and overall health benefits. We discussed the following Behavioral Modification Strategies today: work on meal planning and easy cooking plans and dealing with family or coworker sabotage.  Patricia Lin has agreed to follow up with our clinic in 2 weeks. She was informed of the importance of frequent follow up visits to maximize her success with intensive lifestyle modifications for her multiple health conditions.  ALLERGIES: Allergies  Allergen Reactions  . Latex Itching and Rash    MEDICATIONS: Current Outpatient Medications on File Prior to Visit  Medication Sig Dispense Refill  . ferrous sulfate 325 (65 FE) MG tablet Take 1 tablet (325 mg total) by mouth 3 (three) times daily with meals. 90 tablet 0  . naproxen sodium (ALEVE) 220 MG tablet Take 220 mg by mouth daily as needed.     No current facility-administered medications on file prior to visit.     PAST  MEDICAL HISTORY: Past Medical History:  Diagnosis Date  . Abnormal Pap smear   . Anemia   . Anxiety   . Back pain   . BV (bacterial vaginosis) 2011  . Chlamydia infection   . CIN I (cervical intraepithelial neoplasia I) 2008  . Depression   . Dyspnea   . Dysrhythmia 2009   TACHYCARDIA;   HAD W/U 2010? POSSIBLE HEART VALVE ISSUE; NOT F/U NEEDED X 3 YEARS PER PT  . Dysuria 2010  . Fatigue   . H/O pre-eclampsia in prior pregnancy, currently pregnant   . H/O varicella   .  H/O vitamin D deficiency   . Headache(784.0)    MIGRAINES  . HTN (hypertension)   . Hx of breast reduction, elective   . Hx: UTI (urinary tract infection)   . Infection    UTI DURING PREGNANCY  . Infection    YEAST WITH PREGNANCY  . Infection    CHLAMYDIA X 1  . Knee pain   . 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 Low iron   . PIH (pregnancy induced hypertension) 11-2012   Today pp one month.  B/P nl, and 122/76 on repeat-sitting  . Postpartum depression 2011   NO MEDS  . Pregnancy induced hypertension    ALL PREGNANCIES  . Shortness of breath    WITH EXERTION SICNE PREGNANCY  . Tachycardia   . Tachycardia 2010   HAS HAD WORKUP. UNSURE IF HAS POSSIBLE HEART VALVE ISSUE  . Vulvitis 2009    PAST SURGICAL HISTORY: Past Surgical History:  Procedure Laterality Date  . BREAST REDUCTION SURGERY  2008  . BREAST SURGERY    . DILATION AND CURETTAGE OF UTERUS    . FINGER SURGERY     Right index finger  . TUBAL LIGATION  01/09/2013   Procedure: POST PARTUM TUBAL LIGATION;  Surgeon: Michael Litter, MD;  Location: [facility];  Service: Gynecology;  Laterality: Bilateral;  post partum tubal ligation bilateral  . US ECHOCARDIOGRAPHY  06/18/2009   ef 55-60%  . WISDOM TOOTH EXTRACTION      SOCIAL HISTORY: Social History   Tobacco Use  . Smoking status: Passive Smoke Exposure - Never Smoker  . Smokeless tobacco: Never Used  Substance Use Topics  . Alcohol use: Yes    Comment: WEEKENDS -LIQUOR;  LAST DRANK 04/03/12  . Drug use: No    FAMILY HISTORY: Family History  Problem Relation Age of Onset  . Hypertension Mother   . Hyperlipidemia Mother   . Coronary artery disease Mother   . Heart disease Mother        high cholesterol  . Asthma Mother   . Diabetes Mother   . Depression Mother   . Anxiety disorder Mother   . Sleep apnea Mother   . Obesity Mother   . Eating disorder Mother   . Hypertension Father   .  Hypertension Maternal Grandmother   . Hypertension Maternal Grandfather   . Asthma Daughter   . Alcohol abuse Maternal Uncle   . Drug abuse Maternal Uncle     ROS: Review of Systems  Constitutional: Negative for weight loss.  Gastrointestinal: Negative for nausea and vomiting.  Musculoskeletal:       Negative for muscle weakness.    PHYSICAL EXAM: Blood pressure 118/78, pulse 75, temperature 97.8 F (36.6 C), temperature source Oral, height 5\' 2"  (1.575 m), weight 185 lb (83.9 kg), SpO2 100 %. Body mass index  is 33.84 kg/m. Physical Exam Vitals signs reviewed.  Constitutional:      Appearance: Normal appearance. She is obese.  Cardiovascular:     Rate and Rhythm: Normal rate.  Pulmonary:     Effort: Pulmonary effort is normal.  Musculoskeletal: Normal range of motion.  Skin:    General: Skin is warm and dry.  Neurological:     Mental Status: She is alert and oriented to person, place, and time.  Psychiatric:        Mood and Affect: Mood normal.        Behavior: Behavior normal.     RECENT LABS AND TESTS: BMET    Component Value Date/Time   NA 136 09/08/2018 1042   K 4.1 09/08/2018 1042   CL 99 09/08/2018 1042   CO2 23 09/08/2018 1042   GLUCOSE 88 09/08/2018 1042   GLUCOSE 95 12/05/2017 0747   BUN 14 09/08/2018 1042   CREATININE 0.75 09/08/2018 1042   CREATININE 0.64 01/17/2013 1720   CALCIUM 9.8 09/08/2018 1042   GFRNONAA 101 09/08/2018 1042   GFRAA 117 09/08/2018 1042   Lab Results  Component Value Date   HGBA1C 5.2 09/08/2018   Lab Results  Component Value Date   INSULIN 11.5 09/08/2018   CBC    Component Value Date/Time   WBC 3.8 09/08/2018 1042   WBC 6.0 12/06/2017 0000   RBC 4.21 09/08/2018 1042   RBC 4.52 12/06/2017 0000   HGB 11.1 09/08/2018 1042   HCT 34.9 09/08/2018 1042   PLT 265 12/06/2017 0000   MCV 83 09/08/2018 1042   MCH 26.4 (L) 09/08/2018 1042   MCH 19.7 (L) 12/06/2017 0000   MCHC 31.8 09/08/2018 1042   MCHC 29.5 (L)  12/06/2017 0000   RDW 13.6 09/08/2018 1042   LYMPHSABS 1.6 09/08/2018 1042   MONOABS 0.3 12/04/2017 1858   EOSABS 0.0 09/08/2018 1042   BASOSABS 0.0 09/08/2018 1042   Iron/TIBC/Ferritin/ %Sat    Component Value Date/Time   IRON 32 09/08/2018 1042   TIBC 351 09/08/2018 1042   FERRITIN 9 (L) 09/08/2018 1042   IRONPCTSAT 9 (LL) 09/08/2018 1042   Lipid Panel     Component Value Date/Time   CHOL 193 09/08/2018 1042   TRIG 71 09/08/2018 1042   HDL 78 09/08/2018 1042   LDLCALC 101 (H) 09/08/2018 1042   Hepatic Function Panel     Component Value Date/Time   PROT 7.7 09/08/2018 1042   ALBUMIN 4.5 09/08/2018 1042   AST 25 09/08/2018 1042   ALT 30 09/08/2018 1042   ALKPHOS 78 09/08/2018 1042   BILITOT 0.8 09/08/2018 1042      Component Value Date/Time   TSH 0.755 09/08/2018 1042   Results for Streeter, Zanyla (MRN [id number]) as of 12/15/2018 11:53  Ref. Range 09/08/2018 10:42  Vitamin D, 25-Hydroxy Latest Ref Range: 30.0 - 100.0 ng/mL 9.6 (L)    OBESITY BEHAVIORAL INTERVENTION VISIT  Today's visit was # 6   Starting weight: 191 lbs Starting date: 09/08/18 Today's weight : Weight: 185 lb (83.9 kg)  Today's date: 12/14/2018 Total lbs lost to date: 6  ASK: We discussed the diagnosis of obesity with Marka Pharr today and Ilissa agreed to give [place] permission to discuss obesity behavioral modification therapy today.  ASSESS: Allycia has the diagnosis of obesity and her BMI today is 33.8. Chai is in the action stage of change.   ADVISE: Rashi was educated on the multiple health risks of obesity as well as the benefit  of weight loss to improve her health. She was advised of the need for [place] term treatment and the importance of lifestyle modifications to improve her current health and to decrease her risk of future health problems.  AGREE: Multiple dietary modification options and treatment options were discussed and Jalaila agreed to follow the recommendations documented in the  above note.  ARRANGE: Lawanna was educated on the importance of frequent visits to treat obesity as outlined per CMS and USPSTF guidelines and agreed to schedule her next follow up appointment today.  I, Kirke Corinara Soares, am acting as transcriptionist for Wilder Gladearen D. Naliya Gish, MD  I have reviewed the above documentation for accuracy and completeness, and I agree with the above. -Quillian Quincearen Shiya Fogelman, MD

## 2018-12-17 ENCOUNTER — Ambulatory Visit (INDEPENDENT_AMBULATORY_CARE_PROVIDER_SITE_OTHER): Payer: 59 | Admitting: Nurse Practitioner

## 2018-12-17 ENCOUNTER — Encounter: Payer: Self-pay | Admitting: Nurse Practitioner

## 2018-12-17 ENCOUNTER — Other Ambulatory Visit (HOSPITAL_COMMUNITY)
Admission: RE | Admit: 2018-12-17 | Discharge: 2018-12-17 | Disposition: A | Discharge status: Home / Self Care | Payer: Self-pay | Source: Ambulatory Visit | Attending: Nurse Practitioner | Admitting: Nurse Practitioner

## 2018-12-17 VITALS — BP 122/80 | HR 83 | Temp 98.3°F | Ht 62.0 in | Wt 191.0 lb

## 2018-12-17 DIAGNOSIS — G8929 Other chronic pain: Secondary | ICD-10-CM | POA: Diagnosis not present

## 2018-12-17 DIAGNOSIS — R51 Headache: Secondary | ICD-10-CM

## 2018-12-17 DIAGNOSIS — D509 Iron deficiency anemia, unspecified: Secondary | ICD-10-CM | POA: Diagnosis not present

## 2018-12-17 DIAGNOSIS — F419 Anxiety disorder, unspecified: Secondary | ICD-10-CM | POA: Diagnosis not present

## 2018-12-17 DIAGNOSIS — Z124 Encounter for screening for malignant neoplasm of cervix: Secondary | ICD-10-CM | POA: Diagnosis not present

## 2018-12-17 DIAGNOSIS — Z113 Encounter for screening for infections with a predominantly sexual mode of transmission: Secondary | ICD-10-CM

## 2018-12-17 DIAGNOSIS — R1031 Right lower quadrant pain: Secondary | ICD-10-CM | POA: Diagnosis not present

## 2018-12-17 DIAGNOSIS — R519 Headache, unspecified: Secondary | ICD-10-CM | POA: Insufficient documentation

## 2018-12-17 DIAGNOSIS — Z0000 Encounter for general adult medical examination without abnormal findings: Secondary | ICD-10-CM | POA: Diagnosis not present

## 2018-12-17 LAB — POCT URINALYSIS DIPSTICK
Bilirubin, UA: NEGATIVE
Glucose, UA: NEGATIVE
KETONES UA: NEGATIVE
Leukocytes, UA: NEGATIVE
Nitrite, UA: NEGATIVE
Protein, UA: NEGATIVE
Spec Grav, UA: 1.025 (ref 1.010–1.025)
Urobilinogen, UA: 0.2 E.U./dL
pH, UA: 6 (ref 5.0–8.0)

## 2018-12-17 MED ORDER — TOPIRAMATE 25 MG PO TABS: ORAL_TABLET | ORAL | 2 refills | Status: DC

## 2018-12-17 MED ORDER — HYDROXYZINE HCL 25 MG PO TABS: 25.0000 mg | ORAL_TABLET | Freq: Every evening | ORAL | 0 refills | Status: DC | PRN

## 2018-12-17 NOTE — Patient Instructions (Signed)
Recurrent Migraine Headache A migraine headache is very bad, throbbing pain that is usually on one side of your head. Recurrent migraines keep coming back (recurring). Talk with your doctor about what things may bring on (trigger) your migraine headaches. Follow these instructions at home: Medicines  Take over-the-counter and prescription medicines only as told by your doctor.  Do not drive or use heavy machinery while taking prescription pain medicine. Lifestyle  Do not use any products that contain nicotine or tobacco, such as cigarettes and e-cigarettes. If you need help quitting, ask your doctor.  Limit alcohol intake to no more than 1 drink a day for nonpregnant women and 2 drinks a day for men. One drink equals 12 oz of beer, 5 oz of wine, or 1 oz of hard liquor.  Get 7-9 hours of sleep each night.  Lessen any stress in your life. Ask your doctor about ways to lower your stress.  Stay at a healthy weight. Talk with your doctor if you need help losing weight.  Get regular exercise. General instructions   Keep a journal to find out if certain things bring on migraine headaches. For example, write down: ? What you eat and drink. ? How much sleep you get. ? Any change to your diet or medicines.  Lie down in a dark, quiet room when you have a migraine.  Try placing a cool towel over your head when you have a migraine.  Keep lights dim if bright lights bother you or make your migraines worse.  Keep all follow-up visits as told by your doctor. This is important. Contact a doctor if:  Medicine does not help your migraines.  Your pain keeps coming back.  You have a fever.  You have weight loss without trying. Get help right away if:  Your migraine becomes really bad and medicine does not help.  You have a stiff neck.  You have trouble seeing.  Your muscles are weak or you lose control of your muscles.  You lose your balance or have trouble walking.  You feel  like you will pass out (faint) or you pass out.  You have really bad symptoms that are different than your first symptoms.  You start having sudden, very bad headaches that last for one second or less, like a thunderclap. Summary  A migraine headache is very bad, throbbing pain that is usually on one side of your head.  Talk with your doctor about what things may bring on (trigger) your migraine headaches.  Take over-the-counter and prescription medicines only as told by your doctor.  Lie down in a dark, quiet room when you have a migraine.  Keep a journal about what you eat and drink, how much sleep you get, and any changes to your medicines. This can help you find out if certain things make you have migraine headaches. This information is not intended to replace advice given to you by your health care provider. Make sure you discuss any questions you have with your health care provider. Document Released: 09/02/2008 Document Revised: 10/17/2016 Document Reviewed: 10/17/2016 Elsevier Interactive Patient Education  2019 [place] Maintenance, Female Adopting a healthy lifestyle and getting preventive care can go a [place] way to promote health and wellness. Talk with your health care provider about what schedule of regular examinations is right for you. This is a good chance for you to check in with your provider about disease prevention and staying healthy. In between checkups, there are plenty of things  you can do on your own. Experts have done a lot of research about which lifestyle changes and preventive measures are most likely to keep you healthy. Ask your health care provider for more information. Weight and diet Eat a healthy diet  Be sure to include plenty of vegetables, fruits, low-fat dairy products, and lean protein.  Do not eat a lot of foods high in solid fats, added sugars, or salt.  Get regular exercise. This is one of the most important things you can do for  your health. ? Most adults should exercise for at least 150 minutes each week. The exercise should increase your heart rate and make you sweat (moderate-intensity exercise). ? Most adults should also do strengthening exercises at least twice a week. This is in addition to the moderate-intensity exercise. Maintain a healthy weight  Body mass index (BMI) is a measurement that can be used to identify possible weight problems. It estimates body fat based on height and weight. Your health care provider can help determine your BMI and help you achieve or maintain a healthy weight.  For females 65 years of age and older: ? A BMI below 18.5 is considered underweight. ? A BMI of 18.5 to 24.9 is normal. ? A BMI of 25 to 29.9 is considered overweight. ? A BMI of 30 and above is considered obese. Watch levels of cholesterol and blood lipids  You should start having your blood tested for lipids and cholesterol at 39 years of age, then have this test every 5 years.  You may need to have your cholesterol levels checked more often if: ? Your lipid or cholesterol levels are high. ? You are older than 39 years of age. ? You are at high risk for heart disease. Cancer screening Lung Cancer  Lung cancer screening is recommended for adults 13-20 years old who are at high risk for lung cancer because of a history of smoking.  A yearly low-dose CT scan of the lungs is recommended for people who: ? Currently smoke. ? Have quit within the past 15 years. ? Have at least a 30-pack-year history of smoking. A pack year is smoking an average of one pack of cigarettes a day for 1 year.  Yearly screening should continue until it has been 15 years since you quit.  Yearly screening should stop if you develop a health problem that would prevent you from having lung cancer treatment. Breast Cancer  Practice breast self-awareness. This means understanding how your breasts normally appear and feel.  It also means  doing regular breast self-exams. Let your health care provider know about any changes, no matter how small.  If you are in your 20s or 30s, you should have a clinical breast exam (CBE) by a health care provider every 1-3 years as part of a regular health exam.  If you are 21 or older, have a CBE every year. Also consider having a breast X-ray (mammogram) every year.  If you have a family history of breast cancer, talk to your health care provider about genetic screening.  If you are at high risk for breast cancer, talk to your health care provider about having an MRI and a mammogram every year.  Breast cancer gene (BRCA) assessment is recommended for women who have family members with BRCA-related cancers. BRCA-related cancers include: ? Breast. ? Ovarian. ? Tubal. ? Peritoneal cancers.  Results of the assessment will determine the need for genetic counseling and BRCA1 and BRCA2 testing. Cervical Cancer Your  health care provider may recommend that you be screened regularly for cancer of the pelvic organs (ovaries, uterus, and vagina). This screening involves a pelvic examination, including checking for microscopic changes to the surface of your cervix (Pap test). You may be encouraged to have this screening done every 3 years, beginning at age 44.  For women ages 62-65, health care providers may recommend pelvic exams and Pap testing every 3 years, or they may recommend the Pap and pelvic exam, combined with testing for human papilloma virus (HPV), every 5 years. Some types of HPV increase your risk of cervical cancer. Testing for HPV may also be done on women of any age with unclear Pap test results.  Other health care providers may not recommend any screening for nonpregnant women who are considered low risk for pelvic cancer and who do not have symptoms. Ask your health care provider if a screening pelvic exam is right for you.  If you have had past treatment for cervical cancer or a  condition that could lead to cancer, you need Pap tests and screening for cancer for at least 20 years after your treatment. If Pap tests have been discontinued, your risk factors (such as having a new sexual partner) need to be reassessed to determine if screening should resume. Some women have medical problems that increase the chance of getting cervical cancer. In these cases, your health care provider may recommend more frequent screening and Pap tests. Colorectal Cancer  This type of cancer can be detected and often prevented.  Routine colorectal cancer screening usually begins at 39 years of age and continues through 39 years of age.  Your health care provider may recommend screening at an earlier age if you have risk factors for colon cancer.  Your health care provider may also recommend using home test kits to check for hidden blood in the stool.  A small camera at the end of a tube can be used to examine your colon directly (sigmoidoscopy or colonoscopy). This is done to check for the earliest forms of colorectal cancer.  Routine screening usually begins at age 18.  Direct examination of the colon should be repeated every 5-10 years through 39 years of age. However, you may need to be screened more often if early forms of precancerous polyps or small growths are found. Skin Cancer  Check your skin from head to toe regularly.  Tell your health care provider about any new moles or changes in moles, especially if there is a change in a mole's shape or color.  Also tell your health care provider if you have a mole that is larger than the size of a pencil eraser.  Always use sunscreen. Apply sunscreen liberally and repeatedly throughout the day.  Protect yourself by wearing [place] sleeves, pants, a wide-brimmed hat, and sunglasses whenever you are outside. Heart disease, diabetes, and high blood pressure  High blood pressure causes heart disease and increases the risk of stroke. High  blood pressure is more likely to develop in: ? People who have blood pressure in the high end of the normal range (130-139/85-89 mm Hg). ? People who are overweight or obese. ? People who are African American.  If you are 17-43 years of age, have your blood pressure checked every 3-5 years. If you are 66 years of age or older, have your blood pressure checked every year. You should have your blood pressure measured twice-once when you are at a hospital or clinic, and once when you  are not at a hospital or clinic. Record the average of the two measurements. To check your blood pressure when you are not at a hospital or clinic, you can use: ? An automated blood pressure machine at a pharmacy. ? A home blood pressure monitor.  If you are between 62 years and 22 years old, ask your health care provider if you should take aspirin to prevent strokes.  Have regular diabetes screenings. This involves taking a blood sample to check your fasting blood sugar level. ? If you are at a normal weight and have a low risk for diabetes, have this test once every three years after 39 years of age. ? If you are overweight and have a high risk for diabetes, consider being tested at a younger age or more often. Preventing infection Hepatitis B  If you have a higher risk for hepatitis B, you should be screened for this virus. You are considered at high risk for hepatitis B if: ? You were born in a country where hepatitis B is common. Ask your health care provider which countries are considered high risk. ? Your parents were born in a high-risk country, and you have not been immunized against hepatitis B (hepatitis B vaccine). ? You have HIV or AIDS. ? You use needles to inject street drugs. ? You live with someone who has hepatitis B. ? You have had sex with someone who has hepatitis B. ? You get hemodialysis treatment. ? You take certain medicines for conditions, including cancer, organ transplantation, and  autoimmune conditions. Hepatitis C  Blood testing is recommended for: ? Everyone born from [birth year] through 1966. ? Anyone with known risk factors for hepatitis C. Sexually transmitted infections (STIs)  You should be screened for sexually transmitted infections (STIs) including gonorrhea and chlamydia if: ? You are sexually active and are younger than 39 years of age. ? You are older than 39 years of age and your health care provider tells you that you are at risk for this type of infection. ? Your sexual activity has changed since you were last screened and you are at an increased risk for chlamydia or gonorrhea. Ask your health care provider if you are at risk.  If you do not have HIV, but are at risk, it may be recommended that you take a prescription medicine daily to prevent HIV infection. This is called pre-exposure prophylaxis (PrEP). You are considered at risk if: ? You are sexually active and do not regularly use condoms or know the HIV status of your partner(s). ? You take drugs by injection. ? You are sexually active with a partner who has HIV. Talk with your health care provider about whether you are at high risk of being infected with HIV. If you choose to begin PrEP, you should first be tested for HIV. You should then be tested every 3 months for as [place] as you are taking PrEP. Pregnancy  If you are premenopausal and you may become pregnant, ask your health care provider about preconception counseling.  If you may become pregnant, take 400 to 800 micrograms (mcg) of folic acid every day.  If you want to prevent pregnancy, talk to your health care provider about birth control (contraception). Osteoporosis and menopause  Osteoporosis is a disease in which the bones lose minerals and strength with aging. This can result in serious bone fractures. Your risk for osteoporosis can be identified using a bone density scan.  If you are 28 years of age or  older, or if you are at risk for  osteoporosis and fractures, ask your health care provider if you should be screened.  Ask your health care provider whether you should take a calcium or vitamin D supplement to lower your risk for osteoporosis.  Menopause may have certain physical symptoms and risks.  Hormone replacement therapy may reduce some of these symptoms and risks. Talk to your health care provider about whether hormone replacement therapy is right for you. Follow these instructions at home:  Schedule regular health, dental, and eye exams.  Stay current with your immunizations.  Do not use any tobacco products including cigarettes, chewing tobacco, or electronic cigarettes.  If you are pregnant, do not drink alcohol.  If you are breastfeeding, limit how much and how often you drink alcohol.  Limit alcohol intake to no more than 1 drink per day for nonpregnant women. One drink equals 12 ounces of beer, 5 ounces of wine, or 1 ounces of hard liquor.  Do not use street drugs.  Do not share needles.  Ask your health care provider for help if you need support or information about quitting drugs.  Tell your health care provider if you often feel depressed.  Tell your health care provider if you have ever been abused or do not feel safe at home. This information is not intended to replace advice given to you by your health care provider. Make sure you discuss any questions you have with your health care provider. Document Released: 06/09/2011 Document Revised: 05/01/2016 Document Reviewed: 08/28/2015 Elsevier Interactive Patient Education  2019 [employer].

## 2018-12-17 NOTE — Progress Notes (Signed)
Subjective:     Patient ID: Patricia Lin , female    DOB: 08/08/1981 , 38 y.o.   MRN: [id number]   Chief Complaint  Patient presents with  . Headache    patient needs fmla paperwork filled out  . Annual Exam   The patient states she uses none for birth control. Last LMP was Patient's last menstrual period was 12/13/2018.. Negative for Dysmenorrhea and Negative for Menorrhagia.   Negative for: breast discharge, breast lump(s), breast pain and breast self exam.  Pertinent negatives include abnormal bleeding (hematology), anxiety, decreased libido, depression, difficulty falling sleep, dyspareunia, history of infertility, nocturia, sexual dysfunction, sleep disturbances, urinary incontinence, urinary urgency, vaginal discharge and vaginal itching. Diet regular.The patient states her exercise level is  none    The patient's tobacco use is:  Social History   Tobacco Use  Smoking Status Passive Smoke Exposure - Never Smoker  Smokeless Tobacco Never Used  . She has been exposed to passive smoke. The patient's alcohol use is:  Social History   Substance and Sexual Activity  Alcohol Use Yes   Comment: WEEKENDS -LIQUOR;  LAST DRANK 04/03/12   Additional information: Last pap 2018, next one scheduled for     HPI  Headache   This is a chronic problem. The current episode started more than 1 year ago. The problem occurs intermittently. The problem has been unchanged. The pain does not radiate. The pain quality is not similar to prior headaches. The quality of the pain is described as aching. The pain is at a severity of 4/10. Pertinent negatives include no abdominal pain or dizziness. The symptoms are aggravated by fatigue. She has tried nothing for the symptoms. Her past medical history is significant for migraine headaches and obesity. There is no history of cancer, hypertension or migraines in the family.      Past Medical History:  Diagnosis Date  . Abnormal Pap smear   . Anemia   .  Anxiety   . Back pain   . BV (bacterial vaginosis) 2011  . Chlamydia infection   . CIN I (cervical intraepithelial neoplasia I) 2008  . Depression   . Dyspnea   . Dysrhythmia 2009   TACHYCARDIA;   HAD W/U 2010? POSSIBLE HEART VALVE ISSUE; NOT F/U NEEDED X 3 YEARS PER PT  . Dysuria 2010  . Fatigue   . H/O pre-eclampsia in prior pregnancy, currently pregnant   . H/O varicella   . H/O vitamin D deficiency   . Headache(784.0)    MIGRAINES  . HTN (hypertension)   . Hx of breast reduction, elective   . Hx: UTI (urinary tract infection)   . Infection    UTI DURING PREGNANCY  . Infection    YEAST WITH PREGNANCY  . Infection    CHLAMYDIA X 1  . Knee pain   . 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 Low iron   . PIH (pregnancy induced hypertension) 11-2012   Today pp one month.  B/P nl, and 122/76 on repeat-sitting  . Postpartum depression 2011   NO MEDS  . Pregnancy induced hypertension    ALL PREGNANCIES  . Shortness of breath    WITH EXERTION SICNE PREGNANCY  . Tachycardia   . Tachycardia 2010   HAS HAD WORKUP. UNSURE IF HAS POSSIBLE HEART VALVE ISSUE  . Vulvitis 2009     Family History  Problem Relation Age of Onset  . Hypertension Mother   .  Hyperlipidemia Mother   . Coronary artery disease Mother   . Heart disease Mother        high cholesterol  . Asthma Mother   . Diabetes Mother   . Depression Mother   . Anxiety disorder Mother   . Sleep apnea Mother   . Obesity Mother   . Eating disorder Mother   . Hypertension Father   . Hypertension Maternal Grandmother   . Hypertension Maternal Grandfather   . Asthma Daughter   . Alcohol abuse Maternal Uncle   . Drug abuse Maternal Uncle      Current Outpatient Medications:  .  ferrous sulfate 325 (65 FE) MG tablet, Take 1 tablet (325 mg total) by mouth 3 (three) times daily with meals., Disp: 90 tablet, Rfl: 0 .  Vitamin D, Ergocalciferol, (DRISDOL) 1.25 MG  (50000 UT) CAPS capsule, Take 1 capsule (50,000 Units total) by mouth every 7 (seven) days., Disp: 4 capsule, Rfl: 0   Allergies  Allergen Reactions  . Latex Itching and Rash     Review of Systems  Constitutional: Negative.   HENT: Negative.   Eyes: Negative.   Respiratory: Negative.   Cardiovascular: Negative.  Negative for chest pain, palpitations and leg swelling.  Gastrointestinal: Negative.  Negative for abdominal pain.  Endocrine: Negative.   Genitourinary: Negative.   Musculoskeletal: Negative.   Skin: Negative.   Allergic/Immunologic: Negative.   Neurological: Positive for headaches. Negative for dizziness.  Hematological: Negative.   Psychiatric/Behavioral: Negative.      Today's Vitals   12/17/18 1424  BP: 122/80  Pulse: 83  Temp: 98.3 F (36.8 C)  TempSrc: Oral  SpO2: 96%  Weight: 191 lb (86.6 kg)  Height: _0  (1.575 m)  PainSc: 6   PainLoc: Head   Body mass index is 34.93 kg/m.   Objective:  Physical Exam Vitals signs reviewed.  Constitutional:      Appearance: She is well-developed.  HENT:     Head: Normocephalic and atraumatic.     Right Ear: Hearing, tympanic membrane, ear canal and external ear normal.     Left Ear: Hearing, tympanic membrane, ear canal and external ear normal.     Nose: Nose normal.     Mouth/Throat:     Mouth: Mucous membranes are moist.  Eyes:     General: Lids are normal.     Conjunctiva/sclera: Conjunctivae normal.     Pupils: Pupils are equal, round, and reactive to light.     Funduscopic exam:    Right eye: No papilledema.        Left eye: No papilledema.  Neck:     Musculoskeletal: Full passive range of motion without pain, normal range of motion and neck supple.     Thyroid: No thyroid mass.     Vascular: No carotid bruit.  Cardiovascular:     Rate and Rhythm: Normal rate and regular rhythm.     Pulses: Normal pulses.     Heart sounds: Normal heart sounds. No murmur.  Pulmonary:     Effort: Pulmonary  effort is normal.     Breath sounds: Normal breath sounds.  Abdominal:     General: Bowel sounds are normal.     Palpations: Abdomen is soft.  Genitourinary:    Labia:        Right: No rash or tenderness.        Left: No rash or tenderness.      Vagina: Normal.     Cervix: Normal.  Uterus: Normal.      Adnexa:        Right: Tenderness present. No mass.         Left: No mass or tenderness.    Musculoskeletal: Normal range of motion.  Skin:    General: Skin is warm and dry.     Capillary Refill: Capillary refill takes less than 2 seconds.  Neurological:     Mental Status: She is alert and oriented to person, place, and time.     Cranial Nerves: No cranial nerve deficit.     Sensory: No sensory deficit.  Psychiatric:        Mood and Affect: Mood normal.        Behavior: Behavior normal.        Thought Content: Thought content normal.        Judgment: Judgment normal.         Assessment And Plan:     1. Anxiety  She continues to take hydroxyzine with some relief - hydrOXYzine (ATARAX/VISTARIL) 25 MG tablet; Take 1 tablet (25 mg total) by mouth at bedtime as needed for anxiety.  Dispense: 30 tablet; Refill: 0  2. Generalized headaches  This is ongoing, chronic  Fairly controlled.  Encouraged to avoid foods that increase risk for headaches - CBC with Diff - BMP8+eGFR  3. Iron deficiency anemia, unspecified iron deficiency anemia type  Chronic,   Will check iron studies  Advised to continue with iron supplements - CBC with Diff - Iron, TIBC and Ferritin Panel  4. Health maintenance examination . Behavior modifications discussed and diet history reviewed.   . Pt will continue to exercise regularly and modify diet with low GI, plant based foods and decrease intake of processed foods.  . Recommend intake of daily multivitamin, Vitamin D, and calcium.  . Recommends  immunizations that include influenza, TDAP - BMP8+eGFR - Lipid panel - HIV antibody (with  reflex) - T pallidum Screening [place]  5. Right lower quadrant abdominal pain  She is having painful intercourse and pain to right abdomen  Mild tenderness on palpation  Will check transvaginal ultrasound due to pain on bimanual exam - POCT Urinalysis Dipstick (81002) - US PELVIC COMPLETE WITH TRANSVAGINAL; Future  6. Encounter for Papanicolaou smear of cervix  - Cytology -Pap Smear  7. Encounter for screening examination for sexually transmitted disease  - Cytology (oral, anal, urethral) ancillary only       Minette Brine, FNP

## 2018-12-20 LAB — BMP8+EGFR
BUN/Creatinine Ratio: 18 (ref 9–23)
BUN: 15 mg/dL (ref 6–20)
CHLORIDE: 99 mmol/L (ref 96–106)
CO2: 22 mmol/L (ref 20–29)
Calcium: 9.6 mg/dL (ref 8.7–10.2)
Creatinine, Ser: 0.83 mg/dL (ref 0.57–1.00)
GFR calc non Af Amer: 90 mL/min/{1.73_m2} (ref >59)
GFR, EST AFRICAN AMERICAN: 103 mL/min/{1.73_m2} (ref >59)
Glucose: 85 mg/dL (ref 65–99)
Potassium: 3.9 mmol/L (ref 3.5–5.2)
Sodium: 137 mmol/L (ref 134–144)

## 2018-12-20 LAB — CBC WITH DIFFERENTIAL/PLATELET
BASOS: 1 %
Basophils Absolute: 0.1 10*3/uL (ref 0.0–0.2)
EOS (ABSOLUTE): 0.1 10*3/uL (ref 0.0–0.4)
Eos: 1 %
Hematocrit: 31.5 % — ABNORMAL LOW (ref 34.0–46.6)
Hemoglobin: 9.7 g/dL — ABNORMAL LOW (ref 11.1–15.9)
Immature Grans (Abs): 0 10*3/uL (ref 0.0–0.1)
Immature Granulocytes: 0 %
LYMPHS: 30 %
Lymphocytes Absolute: 1.7 10*3/uL (ref 0.7–3.1)
MCH: 24 pg — ABNORMAL LOW (ref 26.6–33.0)
MCHC: 30.8 g/dL — ABNORMAL LOW (ref 31.5–35.7)
MCV: 78 fL — ABNORMAL LOW (ref 79–97)
Monocytes Absolute: 0.4 10*3/uL (ref 0.1–0.9)
Monocytes: 7 %
NEUTROS ABS: 3.6 10*3/uL (ref 1.4–7.0)
NEUTROS PCT: 61 %
Platelets: 363 10*3/uL (ref 150–450)
RBC: 4.04 x10E6/uL (ref 3.77–5.28)
RDW: 14.9 % (ref 11.7–15.4)
WBC: 5.8 10*3/uL (ref 3.4–10.8)

## 2018-12-20 LAB — IRON,TIBC AND FERRITIN PANEL
Ferritin: 7 ng/mL — ABNORMAL LOW (ref 15–150)
Iron Saturation: 8 % — CL (ref 15–55)
Iron: 29 ug/dL (ref 27–159)
TIBC: 369 ug/dL (ref 250–450)
UIBC: 340 ug/dL (ref 131–425)

## 2018-12-20 LAB — HIV ANTIBODY (ROUTINE TESTING W REFLEX): HIV Screen 4th Generation wRfx: NONREACTIVE

## 2018-12-20 LAB — LIPID PANEL
Chol/HDL Ratio: 3 ratio (ref 0.0–4.4)
Cholesterol, Total: 213 mg/dL — ABNORMAL HIGH (ref 100–199)
HDL: 71 mg/dL (ref >39)
LDL Calculated: 125 mg/dL — ABNORMAL HIGH (ref 0–99)
Triglycerides: 87 mg/dL (ref 0–149)
VLDL Cholesterol Cal: 17 mg/dL (ref 5–40)

## 2018-12-20 LAB — T PALLIDUM SCREENING CASCADE: T pallidum Antibodies (TP-PA): NONREACTIVE

## 2018-12-21 LAB — CYTOLOGY, (ORAL, ANAL, URETHRAL) ANCILLARY ONLY
Bacterial vaginitis: NEGATIVE
Candida vaginitis: NEGATIVE
Chlamydia: NEGATIVE
Neisseria Gonorrhea: NEGATIVE
Trichomonas: NEGATIVE

## 2018-12-21 LAB — CYTOLOGY - PAP: DIAGNOSIS: NEGATIVE

## 2018-12-29 ENCOUNTER — Ambulatory Visit (INDEPENDENT_AMBULATORY_CARE_PROVIDER_SITE_OTHER): Payer: 59 | Admitting: Family Medicine

## 2018-12-29 ENCOUNTER — Encounter (INDEPENDENT_AMBULATORY_CARE_PROVIDER_SITE_OTHER): Payer: Self-pay | Admitting: Family Medicine

## 2018-12-29 VITALS — BP 115/80 | HR 81 | Temp 98.2°F | Ht 62.0 in | Wt 187.0 lb

## 2018-12-29 DIAGNOSIS — E8881 Metabolic syndrome: Secondary | ICD-10-CM

## 2018-12-29 DIAGNOSIS — E669 Obesity, unspecified: Secondary | ICD-10-CM | POA: Diagnosis not present

## 2018-12-29 DIAGNOSIS — Z6834 Body mass index (BMI) 34.0-34.9, adult: Secondary | ICD-10-CM

## 2018-12-30 NOTE — Progress Notes (Signed)
Office: [phone]  /  Fax: [phone]   HPI:   Chief Complaint: OBESITY Davina is here to discuss her progress with her obesity treatment plan. She is on the Category 3 plan and is following her eating plan approximately 0 % of the time. She states she is exercising 0 minutes 0 times per week. Anette is struggling to get back on track, but finds she is struggling with motivation. She is bored with her plan and isn't doing well with meal planning and prepping.  Her weight is 187 lb (84.8 kg) today and has gained 2 pounds since her last visit. She has lost 4 lbs since starting treatment with [place].  Insulin Resistance Marionna has a diagnosis of insulin resistance based on her elevated fasting insulin level >5. Although Annaliz's blood glucose readings are still under good control, insulin resistance puts her at greater risk of metabolic syndrome and diabetes. She is not taking metformin currently. She notes increased polyphagia as her simple carbohydrates have increased, this seems worse at night. She denies hypoglycemia. She continues to work on diet and exercise to decrease risk of diabetes.  ASSESSMENT AND PLAN:  Insulin resistance  Class 1 obesity with serious comorbidity and body mass index (BMI) of 34.0 to 34.9 in adult, unspecified obesity type  PLAN:  Insulin Resistance Rocsi will continue to work on weight loss, exercise, and decreasing simple carbohydrates in her diet to help decrease the risk of diabetes. We dicussed metformin including benefits and risks. She was informed that eating too many simple carbohydrates or too many calories at one sitting increases the likelihood of GI side effects. Idalie declined metformin for now and prescription was not written today. She is to go back to decreasing simple carbohydrates, if she still notes polyphagia we will consider starting metformin. Ciera agrees to follow up with our clinic in 2 weeks with Adah Salvage, FNP as directed to  monitor her progress.  I spent > than 50% of the 25 minute visit on counseling as documented in the note.  Obesity Sarajane is currently in the action stage of change. As such, her goal is to continue with weight loss efforts She has agreed to change to keep a food journal with 1300-1500 calories and 80+ grams of protein daily Dannelle has been instructed to work up to a goal of 150 minutes of combined cardio and strengthening exercise per week for weight loss and overall health benefits. We discussed the following Behavioral Modification Strategies today: increasing lean protein intake, increasing vegetables, work on meal planning and easy cooking plans, and keep a strict food journal   Antonio has agreed to follow up with our clinic in 2 weeks with Adah Salvage, FNP. She was informed of the importance of frequent follow up visits to maximize her success with intensive lifestyle modifications for her multiple health conditions.  ALLERGIES: Allergies  Allergen Reactions  . Latex Itching and Rash    MEDICATIONS: Current Outpatient Medications on File Prior to Visit  Medication Sig Dispense Refill  . ferrous sulfate 325 (65 FE) MG tablet Take 1 tablet (325 mg total) by mouth 3 (three) times daily with meals. 90 tablet 0  . hydrOXYzine (ATARAX/VISTARIL) 25 MG tablet Take 1 tablet (25 mg total) by mouth at bedtime as needed for anxiety. 30 tablet 0  . topiramate (TOPAMAX) 25 MG tablet Take 2 tabs by mouth at bedtime 60 tablet 2  . Vitamin D, Ergocalciferol, (DRISDOL) 1.25 MG (50000 UT) CAPS capsule Take 1 capsule (50,000  Units total) by mouth every 7 (seven) days. 4 capsule 0   No current facility-administered medications on file prior to visit.     PAST MEDICAL HISTORY: Past Medical History:  Diagnosis Date  . Abnormal Pap smear   . Anemia   . Anxiety   . Back pain   . BV (bacterial vaginosis) 2011  . Chlamydia infection   . CIN I (cervical intraepithelial neoplasia I) 2008  .  Depression   . Dyspnea   . Dysrhythmia 2009   TACHYCARDIA;   HAD W/U 2010? POSSIBLE HEART VALVE ISSUE; NOT F/U NEEDED X 3 YEARS PER PT  . Dysuria 2010  . Fatigue   . H/O pre-eclampsia in prior pregnancy, currently pregnant   . H/O varicella   . H/O vitamin D deficiency   . Headache(784.0)    MIGRAINES  . HTN (hypertension)   . Hx of breast reduction, elective   . Hx: UTI (urinary tract infection)   . Infection    UTI DURING PREGNANCY  . Infection    YEAST WITH PREGNANCY  . Infection    CHLAMYDIA X 1  . Knee pain   . 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 Low iron   . PIH (pregnancy induced hypertension) 11-2012   Today pp one month.  B/P nl, and 122/76 on repeat-sitting  . Postpartum depression 2011   NO MEDS  . Pregnancy induced hypertension    ALL PREGNANCIES  . Shortness of breath    WITH EXERTION SICNE PREGNANCY  . Tachycardia   . Tachycardia 2010   HAS HAD WORKUP. UNSURE IF HAS POSSIBLE HEART VALVE ISSUE  . Vulvitis 2009    PAST SURGICAL HISTORY: Past Surgical History:  Procedure Laterality Date  . BREAST REDUCTION SURGERY  2008  . BREAST SURGERY    . DILATION AND CURETTAGE OF UTERUS    . FINGER SURGERY     Right index finger  . TUBAL LIGATION  01/09/2013   Procedure: POST PARTUM TUBAL LIGATION;  Surgeon: Michael LitterNaima A Dillard, MD;  Location: [facility];  Service: Gynecology;  Laterality: Bilateral;  post partum tubal ligation bilateral  . US ECHOCARDIOGRAPHY  06/18/2009   ef 55-60%  . WISDOM TOOTH EXTRACTION      SOCIAL HISTORY: Social History   Tobacco Use  . Smoking status: Passive Smoke Exposure - Never Smoker  . Smokeless tobacco: Never Used  Substance Use Topics  . Alcohol use: Yes    Comment: WEEKENDS -LIQUOR;  LAST DRANK 04/03/12  . Drug use: No    FAMILY HISTORY: Family History  Problem Relation Age of Onset  . Hypertension Mother   . Hyperlipidemia Mother   . Coronary artery disease Mother     . Heart disease Mother        high cholesterol  . Asthma Mother   . Diabetes Mother   . Depression Mother   . Anxiety disorder Mother   . Sleep apnea Mother   . Obesity Mother   . Eating disorder Mother   . Hypertension Father   . Hypertension Maternal Grandmother   . Hypertension Maternal Grandfather   . Asthma Daughter   . Alcohol abuse Maternal Uncle   . Drug abuse Maternal Uncle     ROS: Review of Systems  Constitutional: Negative for weight loss.  Endo/Heme/Allergies:       Positive polyphagia Negative hypoglycemia    PHYSICAL EXAM: Blood pressure 115/80, pulse 81, temperature 98.2 F (  36.8 C), temperature source Oral, height 5\' 2"  (1.575 m), weight 187 lb (84.8 kg), last menstrual period 12/13/2018, SpO2 100 %. Body mass index is 34.2 kg/m. Physical Exam Vitals signs reviewed.  Constitutional:      Appearance: Normal appearance. She is obese.  Cardiovascular:     Rate and Rhythm: Normal rate.     Pulses: Normal pulses.  Pulmonary:     Effort: Pulmonary effort is normal.     Breath sounds: Normal breath sounds.  Musculoskeletal: Normal range of motion.  Skin:    General: Skin is warm and dry.  Neurological:     Mental Status: She is alert and oriented to person, place, and time.  Psychiatric:        Mood and Affect: Mood normal.        Behavior: Behavior normal.     RECENT LABS AND TESTS: BMET    Component Value Date/Time   NA 137 12/17/2018 1522   K 3.9 12/17/2018 1522   CL 99 12/17/2018 1522   CO2 22 12/17/2018 1522   GLUCOSE 85 12/17/2018 1522   GLUCOSE 95 12/05/2017 0747   BUN 15 12/17/2018 1522   CREATININE 0.83 12/17/2018 1522   CREATININE 0.64 01/17/2013 1720   CALCIUM 9.6 12/17/2018 1522   GFRNONAA 90 12/17/2018 1522   GFRAA 103 12/17/2018 1522   Lab Results  Component Value Date   HGBA1C 5.2 09/08/2018   Lab Results  Component Value Date   INSULIN 11.5 09/08/2018   CBC    Component Value Date/Time   WBC 5.8 12/17/2018 1522    WBC 6.0 12/06/2017 0000   RBC 4.04 12/17/2018 1522   RBC 4.52 12/06/2017 0000   HGB 9.7 (L) 12/17/2018 1522   HCT 31.5 (L) 12/17/2018 1522   PLT 363 12/17/2018 1522   MCV 78 (L) 12/17/2018 1522   MCH 24.0 (L) 12/17/2018 1522   MCH 19.7 (L) 12/06/2017 0000   MCHC 30.8 (L) 12/17/2018 1522   MCHC 29.5 (L) 12/06/2017 0000   RDW 14.9 12/17/2018 1522   LYMPHSABS 1.7 12/17/2018 1522   MONOABS 0.3 12/04/2017 1858   EOSABS 0.1 12/17/2018 1522   BASOSABS 0.1 12/17/2018 1522   Iron/TIBC/Ferritin/ %Sat    Component Value Date/Time   IRON 29 12/17/2018 1522   TIBC 369 12/17/2018 1522   FERRITIN 7 (L) 12/17/2018 1522   IRONPCTSAT 8 (LL) 12/17/2018 1522   Lipid Panel     Component Value Date/Time   CHOL 213 (H) 12/17/2018 1522   TRIG 87 12/17/2018 1522   HDL 71 12/17/2018 1522   CHOLHDL 3.0 12/17/2018 1522   LDLCALC 125 (H) 12/17/2018 1522   Hepatic Function Panel     Component Value Date/Time   PROT 7.7 09/08/2018 1042   ALBUMIN 4.5 09/08/2018 1042   AST 25 09/08/2018 1042   ALT 30 09/08/2018 1042   ALKPHOS 78 09/08/2018 1042   BILITOT 0.8 09/08/2018 1042      Component Value Date/Time   TSH 0.755 09/08/2018 1042      OBESITY BEHAVIORAL INTERVENTION VISIT  Today's visit was # 7   Starting weight: 191 lbs Starting date: 09/08/18 Today's weight : 187 lbs  Today's date: 12/29/2018 Total lbs lost to date: 4    ASK: We discussed the diagnosis of obesity with Suhaylah Encalada today and Maciah agreed to give [place] permission to discuss obesity behavioral modification therapy today.  ASSESS: Xandra has the diagnosis of obesity and her BMI today is 34.19 Rayonna is in  the action stage of change   ADVISE: Jonika was educated on the multiple health risks of obesity as well as the benefit of weight loss to improve her health. She was advised of the need for [place] term treatment and the importance of lifestyle modifications to improve her current health and to decrease her risk of  future health problems.  AGREE: Multiple dietary modification options and treatment options were discussed and  Margarete agreed to follow the recommendations documented in the above note.  ARRANGE: Akila was educated on the importance of frequent visits to treat obesity as outlined per CMS and USPSTF guidelines and agreed to schedule her next follow up appointment today.  I, Burt KnackSharon Martin, am acting as transcriptionist for Quillian Quincearen Icelyn Navarrete, MD  I have reviewed the above documentation for accuracy and completeness, and I agree with the above. -Quillian Quincearen Maryclare Nydam, MD

## 2018-12-31 ENCOUNTER — Encounter: Payer: Self-pay | Admitting: Nurse Practitioner

## 2019-01-03 ENCOUNTER — Telehealth: Payer: Self-pay

## 2019-01-03 NOTE — Telephone Encounter (Signed)
RETURNED PT CALL AND ADVISED HER THAT HER FORMS ARE COMPLETED AND AT THE FRONT DESK FOR HER TO PICK UP. YRL,RMA

## 2019-01-11 ENCOUNTER — Ambulatory Visit (INDEPENDENT_AMBULATORY_CARE_PROVIDER_SITE_OTHER): Payer: 59 | Admitting: Psychiatry

## 2019-01-11 ENCOUNTER — Encounter (HOSPITAL_COMMUNITY): Payer: Self-pay | Admitting: Psychiatry

## 2019-01-11 VITALS — BP 125/82 | HR 78 | Ht 62.0 in | Wt 188.0 lb

## 2019-01-11 DIAGNOSIS — F411 Generalized anxiety disorder: Secondary | ICD-10-CM | POA: Diagnosis not present

## 2019-01-11 DIAGNOSIS — F330 Major depressive disorder, recurrent, mild: Secondary | ICD-10-CM | POA: Diagnosis not present

## 2019-01-11 NOTE — Progress Notes (Signed)
BH MD/PA/NP OP Progress Note  01/11/2019 4:09 PM Patricia Lin  MRN:  [id number]  Chief Complaint: I tried 1-2 times the medication but I did not like it.  HPI: Patient came for her follow-up appointment.  On her last visit we tried Prozac but after taking 2 pills she did not like the medication.  She did not describe why she did not like it but endorsed that she is not medication taking present.  In the past she had tried Zoloft stopped due to sexual side effects, Remeron stopped due to weight gain, Vistaril stopped due to sedation.  She admitted most of these medication she tried only for few pills.  She is now drinking tea which is helping her sleep and helping her anxiety.  She is still interested to see a therapist as she still struggle at work.  She is sleeping better.  She is still have crying spells, anxiety and sometimes feeling of hopelessness but denies any suicidal thoughts or homicidal thought.  She like to reestablish care with Lavada Mesi for therapy.  She denies illegal substance use.  Her appetite is okay.  Patient denies any paranoia, hallucination or any suicidal thought.  Visit Diagnosis:    ICD-10-CM   1. GAD (generalized anxiety disorder) F41.1   2. MDD (major depressive disorder), recurrent episode, mild (HCC) F33.0     Past Psychiatric History: Reviewed. No H/O inpatient treatment or any suicidal attempt.  Tried Zoloft in her 26s but stopped due to sexual side effects. Did IOP in June 2019. Tried Remeron (weight gain), Prozac and Vistaril.  Past Medical History:  Past Medical History:  Diagnosis Date  . Abnormal Pap smear   . Anemia   . Anxiety   . Back pain   . BV (bacterial vaginosis) 2011  . Chlamydia infection   . CIN I (cervical intraepithelial neoplasia I) 2008  . Depression   . Dyspnea   . Dysrhythmia 2009   TACHYCARDIA;   HAD W/U 2010? POSSIBLE HEART VALVE ISSUE; NOT F/U NEEDED X 3 YEARS PER PT  . Dysuria 2010  . Fatigue   . H/O pre-eclampsia in prior  pregnancy, currently pregnant   . H/O varicella   . H/O vitamin D deficiency   . Headache(784.0)    MIGRAINES  . HTN (hypertension)   . Hx of breast reduction, elective   . Hx: UTI (urinary tract infection)   . Infection    UTI DURING PREGNANCY  . Infection    YEAST WITH PREGNANCY  . Infection    CHLAMYDIA X 1  . Knee pain   . 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 Low iron   . PIH (pregnancy induced hypertension) 11-2012   Today pp one month.  B/P nl, and 122/76 on repeat-sitting  . Postpartum depression 2011   NO MEDS  . Pregnancy induced hypertension    ALL PREGNANCIES  . Shortness of breath    WITH EXERTION SICNE PREGNANCY  . Tachycardia   . Tachycardia 2010   HAS HAD WORKUP. UNSURE IF HAS POSSIBLE HEART VALVE ISSUE  . Vulvitis 2009    Past Surgical History:  Procedure Laterality Date  . BREAST REDUCTION SURGERY  2008  . BREAST SURGERY    . DILATION AND CURETTAGE OF UTERUS    . FINGER SURGERY     Right index finger  . TUBAL LIGATION  01/09/2013   Procedure: POST PARTUM TUBAL LIGATION;  Surgeon: Pierre Bali  Dillard, MD;  Location: [facility];  Service: Gynecology;  Laterality: Bilateral;  post partum tubal ligation bilateral  . US ECHOCARDIOGRAPHY  06/18/2009   ef 55-60%  . WISDOM TOOTH EXTRACTION      Family Psychiatric History: Reviewed.  Family History:  Family History  Problem Relation Age of Onset  . Hypertension Mother   . Hyperlipidemia Mother   . Coronary artery disease Mother   . Heart disease Mother        high cholesterol  . Asthma Mother   . Diabetes Mother   . Depression Mother   . Anxiety disorder Mother   . Sleep apnea Mother   . Obesity Mother   . Eating disorder Mother   . Hypertension Father   . Hypertension Maternal Grandmother   . Hypertension Maternal Grandfather   . Asthma Daughter   . Alcohol abuse Maternal Uncle   . Drug abuse Maternal Uncle     Social History:  Social History    Socioeconomic History  . Marital status: Single    Spouse name: Patricia Lin  . Number of children: 3  . Years of education: 7613  . Highest education level: Not on file  Occupational History  . Occupation: Physiological scientistCUSTOMER SERVICE     Employer: [employer]  Social Needs  . Financial resource strain: Not on file  . Food insecurity:    Worry: Not on file    Inability: Not on file  . Transportation needs:    Medical: Not on file    Non-medical: Not on file  Tobacco Use  . Smoking status: Passive Smoke Exposure - Never Smoker  . Smokeless tobacco: Never Used  Substance and Sexual Activity  . Alcohol use: Yes    Comment: WEEKENDS -LIQUOR;  LAST DRANK 04/03/12  . Drug use: No  . Sexual activity: Not Currently    Partners: Male    Birth control/protection: None  Lifestyle  . Physical activity:    Days per week: Not on file    Minutes per session: Not on file  . Stress: Not on file  Relationships  . Social connections:    Talks on phone: Not on file    Gets together: Not on file    Attends religious service: Not on file    Active member of club or organization: Not on file    Attends meetings of clubs or organizations: Not on file    Relationship status: Not on file  Other Topics Concern  . Not on file  Social History Narrative  . Not on file    Allergies:  Allergies  Allergen Reactions  . Latex Itching and Rash    Metabolic Disorder Labs: Lab Results  Component Value Date   HGBA1C 5.2 09/08/2018   No results found for: PROLACTIN Lab Results  Component Value Date   CHOL 213 (H) 12/17/2018   TRIG 87 12/17/2018   HDL 71 12/17/2018   CHOLHDL 3.0 12/17/2018   LDLCALC 125 (H) 12/17/2018   LDLCALC 101 (H) 09/08/2018   Lab Results  Component Value Date   TSH 0.755 09/08/2018    Therapeutic Level Labs: No results found for: LITHIUM No results found for: VALPROATE No components found for:  CBMZ  Current Medications: Current Outpatient Medications  Medication Sig  Dispense Refill  . ferrous sulfate 325 (65 FE) MG tablet Take 1 tablet (325 mg total) by mouth 3 (three) times daily with meals. 90 tablet 0  . Vitamin D, Ergocalciferol, (DRISDOL) 1.25 MG (50000  UT) CAPS capsule Take 1 capsule (50,000 Units total) by mouth every 7 (seven) days. 4 capsule 0  . hydrOXYzine (ATARAX/VISTARIL) 25 MG tablet Take 1 tablet (25 mg total) by mouth at bedtime as needed for anxiety. (Patient not taking: Reported on 01/11/2019) 30 tablet 0  . topiramate (TOPAMAX) 25 MG tablet Take 2 tabs by mouth at bedtime (Patient not taking: Reported on 01/11/2019) 60 tablet 2   No current facility-administered medications for this visit.      Musculoskeletal: Strength & Muscle Tone: within normal limits Gait & Station: normal Patient leans: N/A  Psychiatric Specialty Exam: ROS  Blood pressure 125/82, pulse 78, height 5\' 2"  (1.575 m), weight 188 lb (85.3 kg), last menstrual period 12/13/2018, SpO2 98 %.There is no height or weight on file to calculate BMI.  General Appearance: Casual  Eye Contact:  Good  Speech:  Clear and Coherent  Volume:  Normal  Mood:  Dysphoric  Affect:  Congruent  Thought Process:  Goal Directed  Orientation:  Full (Time, Place, and Person)  Thought Content: Rumination   Suicidal Thoughts:  No  Homicidal Thoughts:  No  Memory:  Immediate;   Good Recent;   Good Remote;   Good  Judgement:  Good  Insight:  Fair  Psychomotor Activity:  Normal  Concentration:  Concentration: Fair and Attention Span: Fair  Recall:  Good  Fund of Knowledge: Good  Language: Good  Akathisia:  No  Handed:  Right  AIMS (if indicated): not done  Assets:  Communication Skills Desire for Improvement Housing Physical Health Resilience Social Support Talents/Skills  ADL's:  Intact  Cognition: WNL  Sleep:  Improved   Screenings: PHQ2-9     Office Visit from 12/17/2018 in [facility]   PHQ-2 Total Score  2  4  PHQ-9 Total Score  -  14       Assessment and Plan: Arlyss Represslyssa is a [age] year old African-American employed female with history of depression and anxiety came for appointment.  She had tried multiple antidepressant and antianxiety medication but she did not give enough time and admitted that she does not like taking medication.  Her depression is chronic and I do believe she should resume therapy.  Patient is not interested to take any other medication.  We also talked about holistic approach and provided information for right care services for holistic approach.  I will also schedule appointment to see a therapist Lavada MesiBeth McKenzie who she has seen in the past.  I will not schedule any more appointment unless patient needed for medication management therapy.  Discussed that anytime having active suicidal thoughts or homicidal thought then she need to call 911 or go to local emergency room.  No follow-up needed at this time.   Cleotis NipperSyed T Ceaser Ebeling, MD 01/11/2019, 4:09 PM

## 2019-01-12 ENCOUNTER — Encounter (INDEPENDENT_AMBULATORY_CARE_PROVIDER_SITE_OTHER): Payer: Self-pay | Admitting: Physician Assistant

## 2019-01-12 ENCOUNTER — Ambulatory Visit (INDEPENDENT_AMBULATORY_CARE_PROVIDER_SITE_OTHER): Payer: 59 | Admitting: Physician Assistant

## 2019-01-12 VITALS — BP 123/81 | HR 71 | Temp 97.8°F | Ht 62.0 in | Wt 188.0 lb

## 2019-01-12 DIAGNOSIS — E669 Obesity, unspecified: Secondary | ICD-10-CM

## 2019-01-12 DIAGNOSIS — Z6834 Body mass index (BMI) 34.0-34.9, adult: Secondary | ICD-10-CM

## 2019-01-12 DIAGNOSIS — E7849 Other hyperlipidemia: Secondary | ICD-10-CM

## 2019-01-12 NOTE — Progress Notes (Signed)
Office: [phone]  /  Fax: [phone]   HPI:   Chief Complaint: OBESITY Anikah is here to discuss her progress with her obesity treatment plan. She was to keep a food journal with 1300-1500 calories and 80 grams of protein but is following her eating plan approximately 0% of the time. She states she is exercising 0 minutes 0 times per week. Tyeesha reports that she has not been journaling at all due to birthday parties and [employer] parties. She has not fallen back on the Category 3 plan but states that she likes the plan and would like to try it again. Her weight is 188 lb (85.3 kg) today and has had a weight gain of 1 pound since her last visit. She has lost 3 lbs since starting treatment with [place].  Hyperlipidemia Antoine has hyperlipidemia and has been trying to improve her cholesterol levels with intensive lifestyle modification including a low saturated fat diet, exercise and weight loss. She denies any chest pain. Blannie is on no medication.  ASSESSMENT AND PLAN:  Other hyperlipidemia  Class 1 obesity with serious comorbidity and body mass index (BMI) of 34.0 to 34.9 in adult, unspecified obesity type  PLAN:  Hyperlipidemia Ashiyah was informed of the American Heart Association Guidelines emphasizing intensive lifestyle modifications as the first line treatment for hyperlipidemia. We discussed many lifestyle modifications today in depth, and Harlean will continue to work on decreasing saturated fats such as fatty red meat, butter and many fried foods. She will also increase vegetables and lean protein in her diet and continue to work on exercise and weight loss efforts.  I spent > than 50% of the 15 minute visit on counseling as documented in the note.  Obesity Aubrea is currently in the action stage of change. As such, her goal is to continue with weight loss efforts. She has agreed to change to Category 3 diet. Chamya has been instructed to work up to a goal of 150 minutes  of combined cardio and strengthening exercise per week for weight loss and overall health benefits. We discussed the following Behavioral Modification Strategies today: work on meal planning and easy cooking plans and planning for success.   Grover has agreed to follow up with our clinic in 2 weeks. She was informed of the importance of frequent follow up visits to maximize her success with intensive lifestyle modifications for her multiple health conditions.  ALLERGIES: Allergies  Allergen Reactions  . Latex Itching and Rash    MEDICATIONS: Current Outpatient Medications on File Prior to Visit  Medication Sig Dispense Refill  . ferrous sulfate 325 (65 FE) MG tablet Take 1 tablet (325 mg total) by mouth 3 (three) times daily with meals. 90 tablet 0  . hydrOXYzine (ATARAX/VISTARIL) 25 MG tablet Take 1 tablet (25 mg total) by mouth at bedtime as needed for anxiety. 30 tablet 0  . topiramate (TOPAMAX) 25 MG tablet Take 2 tabs by mouth at bedtime 60 tablet 2  . Vitamin D, Ergocalciferol, (DRISDOL) 1.25 MG (50000 UT) CAPS capsule Take 1 capsule (50,000 Units total) by mouth every 7 (seven) days. 4 capsule 0   No current facility-administered medications on file prior to visit.     PAST MEDICAL HISTORY: Past Medical History:  Diagnosis Date  . Abnormal Pap smear   . Anemia   . Anxiety   . Back pain   . BV (bacterial vaginosis) 2011  . Chlamydia infection   . CIN I (cervical intraepithelial neoplasia I) 2008  .  Depression   . Dyspnea   . Dysrhythmia 2009   TACHYCARDIA;   HAD W/U 2010? POSSIBLE HEART VALVE ISSUE; NOT F/U NEEDED X 3 YEARS PER PT  . Dysuria 2010  . Fatigue   . H/O pre-eclampsia in prior pregnancy, currently pregnant   . H/O varicella   . H/O vitamin D deficiency   . Headache(784.0)    MIGRAINES  . HTN (hypertension)   . Hx of breast reduction, elective   . Hx: UTI (urinary tract infection)   . Infection    UTI DURING PREGNANCY  . Infection    YEAST WITH  PREGNANCY  . Infection    CHLAMYDIA X 1  . Knee pain   . 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 Low iron   . PIH (pregnancy induced hypertension) 11-2012   Today pp one month.  B/P nl, and 122/76 on repeat-sitting  . Postpartum depression 2011   NO MEDS  . Pregnancy induced hypertension    ALL PREGNANCIES  . Shortness of breath    WITH EXERTION SICNE PREGNANCY  . Tachycardia   . Tachycardia 2010   HAS HAD WORKUP. UNSURE IF HAS POSSIBLE HEART VALVE ISSUE  . Vulvitis 2009    PAST SURGICAL HISTORY: Past Surgical History:  Procedure Laterality Date  . BREAST REDUCTION SURGERY  2008  . BREAST SURGERY    . DILATION AND CURETTAGE OF UTERUS    . FINGER SURGERY     Right index finger  . TUBAL LIGATION  01/09/2013   Procedure: POST PARTUM TUBAL LIGATION;  Surgeon: Michael Litter, MD;  Location: [facility];  Service: Gynecology;  Laterality: Bilateral;  post partum tubal ligation bilateral  . US ECHOCARDIOGRAPHY  06/18/2009   ef 55-60%  . WISDOM TOOTH EXTRACTION      SOCIAL HISTORY: Social History   Tobacco Use  . Smoking status: Passive Smoke Exposure - Never Smoker  . Smokeless tobacco: Never Used  Substance Use Topics  . Alcohol use: Yes    Comment: WEEKENDS -LIQUOR;  LAST DRANK 04/03/12  . Drug use: No    FAMILY HISTORY: Family History  Problem Relation Age of Onset  . Hypertension Mother   . Hyperlipidemia Mother   . Coronary artery disease Mother   . Heart disease Mother        high cholesterol  . Asthma Mother   . Diabetes Mother   . Depression Mother   . Anxiety disorder Mother   . Sleep apnea Mother   . Obesity Mother   . Eating disorder Mother   . Hypertension Father   . Hypertension Maternal Grandmother   . Hypertension Maternal Grandfather   . Asthma Daughter   . Alcohol abuse Maternal Uncle   . Drug abuse Maternal Uncle    ROS: Review of Systems  Constitutional: Negative for weight loss.    Cardiovascular: Negative for chest pain.   PHYSICAL EXAM: Blood pressure 123/81, pulse 71, temperature 97.8 F (36.6 C), temperature source Oral, height 5\' 2"  (1.575 m), weight 188 lb (85.3 kg), last menstrual period 12/13/2018, SpO2 99 %. Body mass index is 34.39 kg/m. Physical Exam Vitals signs reviewed.  Constitutional:      Appearance: Normal appearance. She is obese.  Cardiovascular:     Rate and Rhythm: Normal rate.     Pulses: Normal pulses.  Pulmonary:     Effort: Pulmonary effort is normal.     Breath sounds: Normal breath sounds.  Musculoskeletal: Normal range of motion.  Skin:    General: Skin is warm and dry.  Neurological:     Mental Status: She is alert and oriented to person, place, and time.  Psychiatric:        Mood and Affect: Mood normal.        Behavior: Behavior normal.   RECENT LABS AND TESTS: BMET    Component Value Date/Time   NA 137 12/17/2018 1522   K 3.9 12/17/2018 1522   CL 99 12/17/2018 1522   CO2 22 12/17/2018 1522   GLUCOSE 85 12/17/2018 1522   GLUCOSE 95 12/05/2017 0747   BUN 15 12/17/2018 1522   CREATININE 0.83 12/17/2018 1522   CREATININE 0.64 01/17/2013 1720   CALCIUM 9.6 12/17/2018 1522   GFRNONAA 90 12/17/2018 1522   GFRAA 103 12/17/2018 1522   Lab Results  Component Value Date   HGBA1C 5.2 09/08/2018   Lab Results  Component Value Date   INSULIN 11.5 09/08/2018   CBC    Component Value Date/Time   WBC 5.8 12/17/2018 1522   WBC 6.0 12/06/2017 0000   RBC 4.04 12/17/2018 1522   RBC 4.52 12/06/2017 0000   HGB 9.7 (L) 12/17/2018 1522   HCT 31.5 (L) 12/17/2018 1522   PLT 363 12/17/2018 1522   MCV 78 (L) 12/17/2018 1522   MCH 24.0 (L) 12/17/2018 1522   MCH 19.7 (L) 12/06/2017 0000   MCHC 30.8 (L) 12/17/2018 1522   MCHC 29.5 (L) 12/06/2017 0000   RDW 14.9 12/17/2018 1522   LYMPHSABS 1.7 12/17/2018 1522   MONOABS 0.3 12/04/2017 1858   EOSABS 0.1 12/17/2018 1522   BASOSABS 0.1 12/17/2018 1522   Iron/TIBC/Ferritin/  %Sat    Component Value Date/Time   IRON 29 12/17/2018 1522   TIBC 369 12/17/2018 1522   FERRITIN 7 (L) 12/17/2018 1522   IRONPCTSAT 8 (LL) 12/17/2018 1522   Lipid Panel     Component Value Date/Time   CHOL 213 (H) 12/17/2018 1522   TRIG 87 12/17/2018 1522   HDL 71 12/17/2018 1522   CHOLHDL 3.0 12/17/2018 1522   LDLCALC 125 (H) 12/17/2018 1522   Hepatic Function Panel     Component Value Date/Time   PROT 7.7 09/08/2018 1042   ALBUMIN 4.5 09/08/2018 1042   AST 25 09/08/2018 1042   ALT 30 09/08/2018 1042   ALKPHOS 78 09/08/2018 1042   BILITOT 0.8 09/08/2018 1042      Component Value Date/Time   TSH 0.755 09/08/2018 1042   Results for Grochowski, Rael (MRN [id number]) as of 01/12/2019 16:53  Ref. Range 09/08/2018 10:42  Vitamin D, 25-Hydroxy Latest Ref Range: 30.0 - 100.0 ng/mL 9.6 (L)   OBESITY BEHAVIORAL INTERVENTION VISIT  Today's visit was #8   Starting weight: 191 lbs Starting date: 09/08/2018 Today's weight: 188 lbs Today's date: 01/12/2019 Total lbs lost to date: 3  ASK: We discussed the diagnosis of obesity with Laree Rathgeber today and Rexanna agreed to give us permission to discuss obesity behavioral modification therapy today.  ASSESS: Terilyn has the diagnosis of obesity and her BMI today is @ 34.39. Pia is in the action stage of change.  ADVISE: Arlyss Represslyssa was educated on the multiple health risks of obesity as well as the benefit of weight loss to improve her health. She was advised of the need for [place] term treatment and the importance of lifestyle modifications to improve her current health and to decrease her risk of future health problems.  AGREE: Multiple dietary modification  options and treatment options were discussed and  Velecia agreed to follow the recommendations documented in the above note.  ARRANGE: Spruha was educated on the importance of frequent visits to treat obesity as outlined per CMS and USPSTF guidelines and agreed to schedule her next  follow up appointment today.  Fernanda DrumI, Denise Haag, am acting as transcriptionist for Alois Clicheracey Agness Sibrian, PA-C I, Alois Clicheracey Harrol Novello, PA-C have reviewed above note and agree with its content

## 2019-01-14 ENCOUNTER — Encounter: Payer: Self-pay | Admitting: Nurse Practitioner

## 2019-01-17 ENCOUNTER — Ambulatory Visit
Admission: RE | Admit: 2019-01-17 | Discharge: 2019-01-17 | Disposition: A | Discharge status: Home / Self Care | Payer: 59 | Source: Ambulatory Visit | Attending: Nurse Practitioner | Admitting: Nurse Practitioner

## 2019-01-17 DIAGNOSIS — R1031 Right lower quadrant pain: Secondary | ICD-10-CM

## 2019-01-20 ENCOUNTER — Other Ambulatory Visit (INDEPENDENT_AMBULATORY_CARE_PROVIDER_SITE_OTHER): Payer: Self-pay

## 2019-01-20 ENCOUNTER — Encounter (INDEPENDENT_AMBULATORY_CARE_PROVIDER_SITE_OTHER): Payer: Self-pay | Admitting: Family Medicine

## 2019-01-20 ENCOUNTER — Other Ambulatory Visit (INDEPENDENT_AMBULATORY_CARE_PROVIDER_SITE_OTHER): Payer: Self-pay | Admitting: Family Medicine

## 2019-01-20 DIAGNOSIS — E559 Vitamin D deficiency, unspecified: Secondary | ICD-10-CM

## 2019-01-20 MED ORDER — VITAMIN D (ERGOCALCIFEROL) 1.25 MG (50000 UNIT) PO CAPS: 50000.0000 [IU] | ORAL_CAPSULE | ORAL | 0 refills | Status: DC

## 2019-01-24 ENCOUNTER — Encounter: Payer: Self-pay | Admitting: Nurse Practitioner

## 2019-01-25 NOTE — Progress Notes (Signed)
If she is no longer having an issue, we would continue to monitor her pain otherwise I would refer her to a GYN for further evaluation

## 2019-01-28 ENCOUNTER — Ambulatory Visit (INDEPENDENT_AMBULATORY_CARE_PROVIDER_SITE_OTHER): Payer: 59 | Admitting: Nurse Practitioner

## 2019-01-28 ENCOUNTER — Encounter: Payer: Self-pay | Admitting: Nurse Practitioner

## 2019-01-28 ENCOUNTER — Other Ambulatory Visit: Payer: Self-pay

## 2019-01-28 VITALS — BP 136/76 | HR 79 | Temp 97.9°F | Ht 62.0 in | Wt 191.4 lb

## 2019-01-28 DIAGNOSIS — F419 Anxiety disorder, unspecified: Secondary | ICD-10-CM | POA: Diagnosis not present

## 2019-01-28 DIAGNOSIS — Z803 Family history of malignant neoplasm of breast: Secondary | ICD-10-CM

## 2019-01-28 DIAGNOSIS — R519 Headache, unspecified: Secondary | ICD-10-CM

## 2019-01-28 DIAGNOSIS — R1031 Right lower quadrant pain: Secondary | ICD-10-CM | POA: Diagnosis not present

## 2019-01-28 DIAGNOSIS — R51 Headache: Secondary | ICD-10-CM | POA: Diagnosis not present

## 2019-01-28 NOTE — Progress Notes (Signed)
Subjective:     Patient ID: Patricia Lin , female    DOB: 1981-01-30 , 38 y.o.   MRN: [id number]   Chief Complaint  Patient presents with  . Headache    HPI  She has had a headache about 3 days.  Has been taking the medication when she gets a headache. She does report the headache improves when taking the topiramate, she does admit it will help with cravings.  [name] is her obesity provider seeing them every 2 weeks.    Headache   This is a chronic problem. The current episode started more than 1 year ago. The problem occurs intermittently. The pain does not radiate. Pertinent negatives include no abdominal pain, coughing, dizziness or sinus pressure.     Past Medical History:  Diagnosis Date  . Abnormal Pap smear   . Anemia   . Anxiety   . Back pain   . BV (bacterial vaginosis) 2011  . Chlamydia infection   . CIN I (cervical intraepithelial neoplasia I) 2008  . Depression   . Dyspnea   . Dysrhythmia 2009   TACHYCARDIA;   HAD W/U 2010? POSSIBLE HEART VALVE ISSUE; NOT F/U NEEDED X 3 YEARS PER PT  . Dysuria 2010  . Fatigue   . H/O pre-eclampsia in prior pregnancy, currently pregnant   . H/O varicella   . H/O vitamin D deficiency   . Headache(784.0)    MIGRAINES  . HTN (hypertension)   . Hx of breast reduction, elective   . Hx: UTI (urinary tract infection)   . Infection    UTI DURING PREGNANCY  . Infection    YEAST WITH PREGNANCY  . Infection    CHLAMYDIA X 1  . Knee pain   . 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 Low iron   . PIH (pregnancy induced hypertension) 11-2012   Today pp one month.  B/P nl, and 122/76 on repeat-sitting  . Postpartum depression 2011   NO MEDS  . Pregnancy induced hypertension    ALL PREGNANCIES  . Shortness of breath    WITH EXERTION SICNE PREGNANCY  . Tachycardia   . Tachycardia 2010   HAS HAD WORKUP. UNSURE IF HAS POSSIBLE HEART VALVE ISSUE  . Vulvitis 2009     Family  History  Problem Relation Age of Onset  . Hypertension Mother   . Hyperlipidemia Mother   . Coronary artery disease Mother   . Heart disease Mother        high cholesterol  . Asthma Mother   . Diabetes Mother   . Depression Mother   . Anxiety disorder Mother   . Sleep apnea Mother   . Obesity Mother   . Eating disorder Mother   . Hypertension Father   . Hypertension Maternal Grandmother   . Hypertension Maternal Grandfather   . Asthma Daughter   . Alcohol abuse Maternal Uncle   . Drug abuse Maternal Uncle      Current Outpatient Medications:  .  ferrous sulfate 325 (65 FE) MG tablet, Take 1 tablet (325 mg total) by mouth 3 (three) times daily with meals., Disp: 90 tablet, Rfl: 0 .  topiramate (TOPAMAX) 25 MG tablet, Take 2 tabs by mouth at bedtime, Disp: 60 tablet, Rfl: 2 .  hydrOXYzine (ATARAX/VISTARIL) 25 MG tablet, Take 1 tablet (25 mg total) by mouth at bedtime as needed for anxiety., Disp: 30 tablet, Rfl: 0 .  Vitamin D, Ergocalciferol, (DRISDOL)  1.25 MG (50000 UT) CAPS capsule, Take 1 capsule (50,000 Units total) by mouth every 7 (seven) days., Disp: 4 capsule, Rfl: 0   Allergies  Allergen Reactions  . Latex Itching and Rash     Review of Systems  Constitutional: Negative for fatigue.  HENT: Negative for sinus pressure.   Respiratory: Negative for cough.   Cardiovascular: Negative for chest pain, palpitations and leg swelling.  Gastrointestinal: Negative for abdominal pain.  Endocrine: Negative for polydipsia, polyphagia and polyuria.  Neurological: Positive for headaches. Negative for dizziness.     Today's Vitals   01/28/19 1539  BP: 136/76  Pulse: 79  Temp: 97.9 F (36.6 C)  TempSrc: Oral  SpO2: 99%  Weight: 191 lb 6.4 oz (86.8 kg)  Height: 5\' 2"  (1.575 m)   Body mass index is 35.01 kg/m.   Objective:  Physical Exam Vitals signs reviewed.  Constitutional:      Appearance: Normal appearance.  Cardiovascular:     Rate and Rhythm: Normal rate and  regular rhythm.     Pulses: Normal pulses.     Heart sounds: Normal heart sounds. No murmur.  Pulmonary:     Effort: No respiratory distress.     Breath sounds: Normal breath sounds.  Skin:    General: Skin is warm and dry.     Capillary Refill: Capillary refill takes less than 2 seconds.  Neurological:     General: No focal deficit present.     Mental Status: She is alert.  Psychiatric:        Mood and Affect: Mood normal.        Behavior: Behavior normal.        Thought Content: Thought content normal.        Judgment: Judgment normal.         Assessment And Plan:     1. Right lower quadrant abdominal pain  Most recent ultrasound was unable to visualize right ovary would like a referral to GYN for further evaluation - Ambulatory referral to Gynecology  2. Generalized headaches  Chronic,  She has had 3 headaches since her last visit, she has not been taking the topiramate every day only taking when has a headache  Advised to take daily as the therapeutic level is more effective and can help with cravings.  3. Anxiety  She will be seeing the counselor on Tuesday.    4. Family history of breast cancer  - MM Digital Screening; Future        Arnette Felts, FNP

## 2019-01-31 ENCOUNTER — Ambulatory Visit (INDEPENDENT_AMBULATORY_CARE_PROVIDER_SITE_OTHER): Payer: PRIVATE HEALTH INSURANCE | Admitting: Psychiatry

## 2019-01-31 ENCOUNTER — Encounter (HOSPITAL_COMMUNITY): Payer: Self-pay | Admitting: Psychiatry

## 2019-01-31 DIAGNOSIS — F330 Major depressive disorder, recurrent, mild: Secondary | ICD-10-CM

## 2019-01-31 NOTE — Progress Notes (Signed)
[name]  Date: 01/31/19  Time: 3:34-4:27p  Type of Therapy: Individual Therapy  Axis I/II Diagnosis:? Major Depressive Disorder, Chronic, Mild  Treatment goals addressed: Patricia Lin will engage in individual therapy to work on self-care, healthy decision making and boundary setting, to decrease depressive symptoms and improve overall well-being.  Interventions: CBT, Motivational Interviewing, Psychoeducation, Coping Skill Building  Summary: Client Patricia Lin, 38yo female who presents with Major Depressive Disorder, due to professional, financial, marital and familial stressors. Counselor using therapeutic interventions to address depressive symptoms of emotional numbing/avoiding and to prepare for finding a new vocation, housing, relationships, and support system.  Therapist Response: Patricia Lin met with Counselor for individual therapy. Counselor joined with Patricia Lin as she shared about job, home life, relationships, therapy needs, treatment goals and resources. Counselor assessed current psychiatric symptoms and life stressors with Patricia Lin. Patricia Lin reports calling out of work 30-40 hours per month, sitting in her work parking lot crying, avoiding work, expressing frustrations at home and has lost interest in things she use to enjoy. She reports feeling burned out and overwhelmed in all areas of life. Counselor assessed her therapeutic needs, gathering information, creating goals, and a plan for treatment. Patricia Lin is interested in rejoining the IOP group, but will need to meet with the intake coordinator to see if that is feasible. Patricia Lin reported stopping her medications because of the negative side effects. Reports Dr. Adele Schilder has recommended her to a Biochemist, clinical. Counselor provided resources for wellness in the community. Counselor and Patricia Lin decided to start meeting on a routine basis and looking into starting IOP.  Suicidal/Homicidal: No current safety concerns. No plan/intent to harm self or others.   Plan: To return in 2 week. Will apply skills learned in session at home, until next session.  ?  Lise Auer, LCSW

## 2019-02-01 ENCOUNTER — Ambulatory Visit (INDEPENDENT_AMBULATORY_CARE_PROVIDER_SITE_OTHER): Payer: 59 | Admitting: Family Medicine

## 2019-02-01 ENCOUNTER — Encounter (INDEPENDENT_AMBULATORY_CARE_PROVIDER_SITE_OTHER): Payer: Self-pay | Admitting: Family Medicine

## 2019-02-01 VITALS — BP 115/78 | HR 75 | Ht 62.0 in | Wt 185.0 lb

## 2019-02-01 DIAGNOSIS — Z9189 Other specified personal risk factors, not elsewhere classified: Secondary | ICD-10-CM

## 2019-02-01 DIAGNOSIS — E559 Vitamin D deficiency, unspecified: Secondary | ICD-10-CM | POA: Diagnosis not present

## 2019-02-01 DIAGNOSIS — E669 Obesity, unspecified: Secondary | ICD-10-CM

## 2019-02-01 DIAGNOSIS — Z6833 Body mass index (BMI) 33.0-33.9, adult: Secondary | ICD-10-CM

## 2019-02-01 MED ORDER — VITAMIN D (ERGOCALCIFEROL) 1.25 MG (50000 UNIT) PO CAPS: 50000.0000 [IU] | ORAL_CAPSULE | ORAL | 0 refills | Status: DC

## 2019-02-02 ENCOUNTER — Other Ambulatory Visit: Payer: Self-pay | Admitting: Nurse Practitioner

## 2019-02-02 DIAGNOSIS — Z1231 Encounter for screening mammogram for malignant neoplasm of breast: Secondary | ICD-10-CM

## 2019-02-02 NOTE — Progress Notes (Signed)
Office: [phone]  /  Fax: [phone]   HPI:   Chief Complaint: OBESITY Patricia Lin is here to discuss her progress with her obesity treatment plan. She is on the Category 3 plan and is following her eating plan approximately 60 % of the time. She states she is taking the stairs at works. Patches continues to lose weight, but is deviating from her plan more. She is disappointed that her weight loss isn't faster. She has some celebrations coming up and needs advice on how to avoid weight gain.  Her weight is 185 lb (83.9 kg) today and has had a weight loss of 3 pounds over a period of 2 weeks since her last visit. She has lost 6 lbs since starting treatment with [place].  Vitamin D deficiency Patricia Lin has a diagnosis of vitamin D deficiency. She is currently stable on vit D and denies nausea, vomiting, or muscle weakness.  At risk for osteopenia and osteoporosis Patricia Lin is at higher risk of osteopenia and osteoporosis due to vitamin D deficiency.   ASSESSMENT AND PLAN:  Vitamin D deficiency - Plan: Vitamin D, Ergocalciferol, (DRISDOL) 1.25 MG (50000 UT) CAPS capsule  At risk for osteoporosis  Class 1 obesity with serious comorbidity and body mass index (BMI) of 33.0 to 33.9 in adult, unspecified obesity type  PLAN:  Vitamin D Deficiency Patricia Lin was informed that low vitamin D levels contributes to fatigue and are associated with obesity, breast, and colon cancer. [name] agrees to continue to take prescription Vit D ,000 IU every week #4 with no refills and will follow up for routine testing of vitamin D, at least 2-3 times per year. She was informed of the risk of over-replacement of vitamin D and agrees to not increase her dose unless she discusses this with [place] first. [name] agrees to follow up in 3 weeks as directed.  At risk for osteopenia and osteoporosis Patricia Lin was given extended (15 minutes) osteoporosis prevention counseling today. [name] is at risk for osteopenia and osteoporosis  due to her vitamin D deficiency. She was encouraged to take her vitamin D and follow her higher calcium diet and increase strengthening exercise to help strengthen her bones and decrease her risk of osteopenia and osteoporosis.  Obesity Patricia Lin is currently in the action stage of change. As such, her goal is to continue with weight loss efforts. She has agreed to follow the Category 3 plan. Patricia Lin has been instructed to work up to a goal of 150 minutes of combined cardio and strengthening exercise per week for weight loss and overall health benefits. We discussed the following Behavioral Modification Strategies today: increasing lean protein intake, decreasing simple carbohydrates, no skipping meals, better snacking choices, and celebration eating strategies.   Patricia Lin has agreed to follow up with our clinic in 3 weeks. She was informed of the importance of frequent follow up visits to maximize her success with intensive lifestyle modifications for her multiple health conditions.  ALLERGIES: Allergies  Allergen Reactions  . Latex Itching and Rash    MEDICATIONS: Current Outpatient Medications on File Prior to Visit  Medication Sig Dispense Refill  . ferrous sulfate 325 (65 FE) MG tablet Take 1 tablet (325 mg total) by mouth 3 (three) times daily with meals. 90 tablet 0  . hydrOXYzine (ATARAX/VISTARIL) 25 MG tablet Take 1 tablet (25 mg total) by mouth at bedtime as needed for anxiety. 30 tablet 0  . topiramate (TOPAMAX) 25 MG tablet Take 2 tabs by mouth at bedtime 60 tablet 2  No current facility-administered medications on file prior to visit.     PAST MEDICAL HISTORY: Past Medical History:  Diagnosis Date  . Abnormal Pap smear   . Anemia   . Anxiety   . Back pain   . BV (bacterial vaginosis) 2011  . Chlamydia infection   . CIN I (cervical intraepithelial neoplasia I) 2008  . Depression   . Dyspnea   . Dysrhythmia 2009   TACHYCARDIA;   HAD W/U 2010? POSSIBLE HEART VALVE ISSUE;  NOT F/U NEEDED X 3 YEARS PER PT  . Dysuria 2010  . Fatigue   . H/O pre-eclampsia in prior pregnancy, currently pregnant   . H/O varicella   . H/O vitamin D deficiency   . Headache(784.0)    MIGRAINES  . HTN (hypertension)   . Hx of breast reduction, elective   . Hx: UTI (urinary tract infection)   . Infection    UTI DURING PREGNANCY  . Infection    YEAST WITH PREGNANCY  . Infection    CHLAMYDIA X 1  . Knee pain   . 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 Low iron   . PIH (pregnancy induced hypertension) 11-2012   Today pp one month.  B/P nl, and 122/76 on repeat-sitting  . Postpartum depression 2011   NO MEDS  . Pregnancy induced hypertension    ALL PREGNANCIES  . Shortness of breath    WITH EXERTION SICNE PREGNANCY  . Tachycardia   . Tachycardia 2010   HAS HAD WORKUP. UNSURE IF HAS POSSIBLE HEART VALVE ISSUE  . Vulvitis 2009    PAST SURGICAL HISTORY: Past Surgical History:  Procedure Laterality Date  . BREAST REDUCTION SURGERY  2008  . BREAST SURGERY    . DILATION AND CURETTAGE OF UTERUS    . FINGER SURGERY     Right index finger  . TUBAL LIGATION  01/09/2013   Procedure: POST PARTUM TUBAL LIGATION;  Surgeon: Michael Litter, MD;  Location: [facility];  Service: Gynecology;  Laterality: Bilateral;  post partum tubal ligation bilateral  . US ECHOCARDIOGRAPHY  06/18/2009   ef 55-60%  . WISDOM TOOTH EXTRACTION      SOCIAL HISTORY: Social History   Tobacco Use  . Smoking status: Passive Smoke Exposure - Never Smoker  . Smokeless tobacco: Never Used  Substance Use Topics  . Alcohol use: Yes    Comment: socially  . Drug use: No    FAMILY HISTORY: Family History  Problem Relation Age of Onset  . Hypertension Mother   . Hyperlipidemia Mother   . Coronary artery disease Mother   . Heart disease Mother        high cholesterol  . Asthma Mother   . Diabetes Mother   . Depression Mother   . Anxiety disorder  Mother   . Sleep apnea Mother   . Obesity Mother   . Eating disorder Mother   . Hypertension Father   . Hypertension Maternal Grandmother   . Hypertension Maternal Grandfather   . Asthma Daughter   . Alcohol abuse Maternal Uncle   . Drug abuse Maternal Uncle     ROS: Review of Systems  Constitutional: Positive for weight loss.  Gastrointestinal: Negative for nausea and vomiting.  Musculoskeletal:       Negative for muscle weakness.    PHYSICAL EXAM: Blood pressure 115/78, pulse 75, height 5\' 2"  (1.575 m), weight 185 lb (83.9 kg), last menstrual period 01/07/2019, SpO2  100 %. Body mass index is 33.84 kg/m. Physical Exam Vitals signs reviewed.  Constitutional:      Appearance: Normal appearance. She is obese.  Cardiovascular:     Rate and Rhythm: Normal rate.  Pulmonary:     Effort: Pulmonary effort is normal.  Musculoskeletal: Normal range of motion.  Skin:    General: Skin is warm and dry.  Neurological:     Mental Status: She is alert and oriented to person, place, and time.  Psychiatric:        Mood and Affect: Mood normal.        Behavior: Behavior normal.     RECENT LABS AND TESTS: BMET    Component Value Date/Time   NA 137 12/17/2018 1522   K 3.9 12/17/2018 1522   CL 99 12/17/2018 1522   CO2 22 12/17/2018 1522   GLUCOSE 85 12/17/2018 1522   GLUCOSE 95 12/05/2017 0747   BUN 15 12/17/2018 1522   CREATININE 0.83 12/17/2018 1522   CREATININE 0.64 01/17/2013 1720   CALCIUM 9.6 12/17/2018 1522   GFRNONAA 90 12/17/2018 1522   GFRAA 103 12/17/2018 1522   Lab Results  Component Value Date   HGBA1C 5.2 09/08/2018   Lab Results  Component Value Date   INSULIN 11.5 09/08/2018   CBC    Component Value Date/Time   WBC 5.8 12/17/2018 1522   WBC 6.0 12/06/2017 0000   RBC 4.04 12/17/2018 1522   RBC 4.52 12/06/2017 0000   HGB 9.7 (L) 12/17/2018 1522   HCT 31.5 (L) 12/17/2018 1522   PLT 363 12/17/2018 1522   MCV 78 (L) 12/17/2018 1522   MCH 24.0 (L)  12/17/2018 1522   MCH 19.7 (L) 12/06/2017 0000   MCHC 30.8 (L) 12/17/2018 1522   MCHC 29.5 (L) 12/06/2017 0000   RDW 14.9 12/17/2018 1522   LYMPHSABS 1.7 12/17/2018 1522   MONOABS 0.3 12/04/2017 1858   EOSABS 0.1 12/17/2018 1522   BASOSABS 0.1 12/17/2018 1522   Iron/TIBC/Ferritin/ %Sat    Component Value Date/Time   IRON 29 12/17/2018 1522   TIBC 369 12/17/2018 1522   FERRITIN 7 (L) 12/17/2018 1522   IRONPCTSAT 8 (LL) 12/17/2018 1522   Lipid Panel     Component Value Date/Time   CHOL 213 (H) 12/17/2018 1522   TRIG 87 12/17/2018 1522   HDL 71 12/17/2018 1522   CHOLHDL 3.0 12/17/2018 1522   LDLCALC 125 (H) 12/17/2018 1522   Hepatic Function Panel     Component Value Date/Time   PROT 7.7 09/08/2018 1042   ALBUMIN 4.5 09/08/2018 1042   AST 25 09/08/2018 1042   ALT 30 09/08/2018 1042   ALKPHOS 78 09/08/2018 1042   BILITOT 0.8 09/08/2018 1042      Component Value Date/Time   TSH 0.755 09/08/2018 1042   Results for Pesci, Patricia Lin (MRN [id number]) as of 02/02/2019 07:41  Ref. Range 09/08/2018 10:42  Vitamin D, 25-Hydroxy Latest Ref Range: 30.0 - 100.0 ng/mL 9.6 (L)    OBESITY BEHAVIORAL INTERVENTION VISIT  Today's visit was # 9   Starting weight: 191 lbs Starting date: 09/08/18 Today's weight : Weight: 185 lb (83.9 kg)  Today's date: 02/01/2019 Total lbs lost to date: 6    02/01/2019  Height 5\' 2"  (1.575 m)  Weight 185 lb (83.9 kg)  BMI (Calculated) 33.83  BLOOD PRESSURE - SYSTOLIC 115  BLOOD PRESSURE - DIASTOLIC 78   Body Fat % 39.4 %  Total Body Water (lbs) 73.8 lbs    ASK:  We discussed the diagnosis of obesity with Patricia Lin today and Patricia Lin agreed to give [place] permission to discuss obesity behavioral modification therapy today.  ASSESS: Patricia Lin has the diagnosis of obesity and her BMI today is 33.83. Patricia Lin is in the action stage of change.   ADVISE: Patricia Lin was educated on the multiple health risks of obesity as well as the benefit of weight loss to  improve her health. She was advised of the need for Patricia Lin term treatment and the importance of lifestyle modifications to improve her current health and to decrease her risk of future health problems.  AGREE: Multiple dietary modification options and treatment options were discussed and Patricia Lin agreed to follow the recommendations documented in the above note.  ARRANGE: Patricia Lin was educated on the importance of frequent visits to treat obesity as outlined per CMS and USPSTF guidelines and agreed to schedule her next follow up appointment today.  IKirke Corin, CMA, am acting as transcriptionist for Wilder Glade, MD  I have reviewed the above documentation for accuracy and completeness, and I agree with the above. -Quillian Quince, MD

## 2019-02-08 ENCOUNTER — Encounter: Payer: Self-pay | Admitting: Nurse Practitioner

## 2019-02-14 ENCOUNTER — Encounter (HOSPITAL_COMMUNITY): Payer: Self-pay | Admitting: Psychiatry

## 2019-02-14 ENCOUNTER — Ambulatory Visit (INDEPENDENT_AMBULATORY_CARE_PROVIDER_SITE_OTHER): Payer: 59 | Admitting: Psychiatry

## 2019-02-14 DIAGNOSIS — F330 Major depressive disorder, recurrent, mild: Secondary | ICD-10-CM | POA: Diagnosis not present

## 2019-02-14 DIAGNOSIS — F411 Generalized anxiety disorder: Secondary | ICD-10-CM

## 2019-02-15 ENCOUNTER — Other Ambulatory Visit: Payer: Self-pay | Admitting: Nurse Practitioner

## 2019-02-15 NOTE — Progress Notes (Signed)
[name]  Date: 02/14/19  Time: 3:32-4:26p  Type of Therapy: Individual Therapy  Axis I/II Diagnosis:? Major Depressive Disorder, Chronic, Mild  Treatment goals addressed: Patricia Lin will engage in individual therapy to work on self-care, healthy decision making and boundary setting, to decrease depressive symptoms and improve overall well-being.  Interventions: CBT, Motivational Interviewing, Psychoeducation, Coping Skill Building  Summary: Client Patricia Lin, 38yo female who presents with Major Depressive Disorder, due to professional, financial, marital and familial stressors. Counselor using therapeutic interventions to address depressive symptoms of emotional numbing/avoiding and to prepare for finding a new vocation, housing, relationships, and support system.  Therapist Response: Patricia Lin met with Counselor for individual therapy. Counselor joined with Patricia Lin as she provided updates on her work situation, marital issues, mental health and family concerns. Counselor assessed current psychiatric symptoms and life stressors with Patricia Lin. Patricia Lin reported that she continues to miss multiple days of work in one week, due to anxiety and depressive symptoms. She has no desire or interest to continue working in the current conditions. Counselor explored alternative vocations with Patricia Lin, identifying several viable options. Patricia Lin expressed concern about changing jobs/careers and potentially having financial challenges and lack of health care. Counselor offered for Patricia Lin to consider IOP, if work will Patricia Lin to cover her position. Patricia Lin expressed interest. Patricia Lin also shared that she is not only wanting to escape from work, but that she is also having major issues at home with her husband's behaviors and her inability to "control" her 3 children at home and 1 daughter in college. Counselor provided psychoeducation about the stages of families, and that her role as mother is shifting for the children which may  be causing some identity issues. Counselor challenged Thresa to continue applying for alternative jobs, look into joining IOP group and to use gratitude statements and positive affirmations to work through her anxieties.  Suicidal/Homicidal: No current safety concerns. No plan/intent to harm self or others.  Plan: To return in 2 week. Will apply skills learned in session at home, until next session.  ?  Patricia Auer, LCSW

## 2019-02-21 ENCOUNTER — Telehealth (HOSPITAL_COMMUNITY): Payer: Self-pay | Admitting: Psychiatry

## 2019-02-22 ENCOUNTER — Ambulatory Visit (INDEPENDENT_AMBULATORY_CARE_PROVIDER_SITE_OTHER): Payer: 59 | Admitting: Family Medicine

## 2019-02-23 ENCOUNTER — Encounter (INDEPENDENT_AMBULATORY_CARE_PROVIDER_SITE_OTHER): Payer: Self-pay

## 2019-02-23 ENCOUNTER — Ambulatory Visit (INDEPENDENT_AMBULATORY_CARE_PROVIDER_SITE_OTHER): Payer: 59 | Admitting: Family Medicine

## 2019-02-28 ENCOUNTER — Ambulatory Visit: Payer: Self-pay

## 2019-03-03 ENCOUNTER — Encounter (INDEPENDENT_AMBULATORY_CARE_PROVIDER_SITE_OTHER): Payer: Self-pay

## 2019-03-08 ENCOUNTER — Other Ambulatory Visit (INDEPENDENT_AMBULATORY_CARE_PROVIDER_SITE_OTHER): Payer: Self-pay | Admitting: Family Medicine

## 2019-03-08 DIAGNOSIS — E559 Vitamin D deficiency, unspecified: Secondary | ICD-10-CM

## 2019-03-11 ENCOUNTER — Encounter (INDEPENDENT_AMBULATORY_CARE_PROVIDER_SITE_OTHER): Payer: Self-pay | Admitting: Family Medicine

## 2019-03-14 ENCOUNTER — Encounter (INDEPENDENT_AMBULATORY_CARE_PROVIDER_SITE_OTHER): Payer: Self-pay | Admitting: Family Medicine

## 2019-03-16 ENCOUNTER — Ambulatory Visit (INDEPENDENT_AMBULATORY_CARE_PROVIDER_SITE_OTHER): Payer: 59 | Admitting: Family Medicine

## 2019-03-16 ENCOUNTER — Other Ambulatory Visit: Payer: Self-pay

## 2019-03-16 ENCOUNTER — Encounter (INDEPENDENT_AMBULATORY_CARE_PROVIDER_SITE_OTHER): Payer: Self-pay | Admitting: Family Medicine

## 2019-03-16 DIAGNOSIS — Z6833 Body mass index (BMI) 33.0-33.9, adult: Secondary | ICD-10-CM

## 2019-03-16 DIAGNOSIS — F3289 Other specified depressive episodes: Secondary | ICD-10-CM | POA: Diagnosis not present

## 2019-03-16 DIAGNOSIS — E669 Obesity, unspecified: Secondary | ICD-10-CM | POA: Diagnosis not present

## 2019-03-16 DIAGNOSIS — E559 Vitamin D deficiency, unspecified: Secondary | ICD-10-CM

## 2019-03-16 MED ORDER — TOPIRAMATE 100 MG PO TABS: 100.0000 mg | ORAL_TABLET | Freq: Every day | ORAL | 0 refills | Status: DC

## 2019-03-16 MED ORDER — VITAMIN D (ERGOCALCIFEROL) 1.25 MG (50000 UNIT) PO CAPS: 50000.0000 [IU] | ORAL_CAPSULE | ORAL | 0 refills | Status: DC

## 2019-03-16 NOTE — Progress Notes (Signed)
Office: [phone]  /  Fax: [phone] TeleHealth Visit:  Patricia Lin has verbally consented to this TeleHealth visit today. The patient is located at home, the provider is located at the [facility] office. The participants in this visit include the listed provider and patient. The visit was conducted today via Face Time.  HPI:   Chief Complaint: OBESITY Patricia Lin is here to discuss her progress with her obesity treatment plan. She is on the Category 3 plan and is following her eating plan approximately 0 % of the time. She states she is dancing with her daughter and walking the block for 30 minutes 3 times per week. Patricia Lin feels like she has gained about 11 pounds since our last visit. She has fallen off track and notes significant emotional eating, especially since COVID19 isolation. She recognizes the need to get back on track.  We were unable to weigh the patient today for this TeleHealth visit. She feels as if she has gained weight since her last visit. She has lost 6 lbs since starting treatment with Patricia Lin.  Vitamin D Deficiency Patricia Lin has a diagnosis of vitamin D deficiency. She is currently on vit D, but is not yet at goal. Patricia Lin denies nausea, vomiting, or muscle weakness.  Depression with emotional eating behaviors Patricia Lin has been on topiramate 50 mg qhs for 2 weeks. She is still struggling with stress eating and even some binge episodes. She has a history of tubal ligation for birth control. Patricia Lin is struggling with emotional eating and using food for comfort to the extent that it is negatively impacting her health. She often snacks when she is not hungry. Patricia Lin sometimes feels she is out of control and then feels guilty that she made poor food choices. She has been working on behavior modification techniques to help reduce her emotional eating and has been somewhat successful. She shows no sign of suicidal or homicidal ideations.  ASSESSMENT AND PLAN:  Vitamin D  deficiency - Plan: Vitamin D, Ergocalciferol, (DRISDOL) 1.25 MG (50000 UT) CAPS capsule  Other depression - with emotional eating - Plan: topiramate (TOPAMAX) 100 MG tablet  Class 1 obesity with serious comorbidity and body mass index (BMI) of 33.0 to 33.9 in adult, unspecified obesity type  PLAN:  Vitamin D Deficiency Patricia Lin was informed that low vitamin D levels contribute to fatigue and are associated with obesity, breast, and colon cancer. Patricia Lin agrees to continue to take prescription Vit D @50 ,000 IU every week #4 with no refills and will follow up for routine testing of vitamin D, at least 2-3 times per year. She was informed of the risk of over-replacement of vitamin D and agrees to not increase her dose unless she discusses this with Patricia Lin first. Patricia Lin agrees to follow up in 3 weeks as directed.  Depression with Emotional Eating Behaviors We discussed behavior modification techniques today to help Patricia Lin with her emotional eating and depression. She has agreed to increase topiramate to 100 mg qhs #30 with no refills and she agreed to follow up as directed.  Obesity Patricia Lin is currently in the action stage of change. As such, her goal is to continue with weight loss efforts. She has agreed to follow the Category 3 plan. [name] has been instructed to work up to a goal of 150 minutes of combined cardio and strengthening exercise per week for weight loss and overall health benefits. We discussed the following Behavioral Modification Strategies today: work on meal planning and easy cooking plans, emotional  eating strategies, ways to avoid boredom eating, no skipping meals, keeping healthy foods in the home, better snacking choices, and ways to avoid night time snacking.  Patricia Lin has agreed to follow up with our clinic in 3 weeks. She was informed of the importance of frequent follow up visits to maximize her success with intensive lifestyle modifications for her multiple health conditions.   ALLERGIES: Allergies  Allergen Reactions  . Latex Itching and Rash    MEDICATIONS: Current Outpatient Medications on File Prior to Visit  Medication Sig Dispense Refill  . ferrous sulfate 325 (65 FE) MG tablet Take 1 tablet (325 mg total) by mouth 3 (three) times daily with meals. 90 tablet 0  . hydrOXYzine (ATARAX/VISTARIL) 25 MG tablet Take 1 tablet (25 mg total) by mouth at bedtime as needed for anxiety. 30 tablet 0   No current facility-administered medications on file prior to visit.     PAST MEDICAL HISTORY: Past Medical History:  Diagnosis Date  . Abnormal Pap smear   . Anemia   . Anxiety   . Back pain   . BV (bacterial vaginosis) 2011  . Chlamydia infection   . CIN I (cervical intraepithelial neoplasia I) 2008  . Depression   . Dyspnea   . Dysrhythmia 2009   TACHYCARDIA;   HAD W/U 2010? POSSIBLE HEART VALVE ISSUE; NOT F/U NEEDED X 3 YEARS PER PT  . Dysuria 2010  . Fatigue   . H/O pre-eclampsia in prior pregnancy, currently pregnant   . H/O varicella   . H/O vitamin D deficiency   . Headache(784.0)    MIGRAINES  . HTN (hypertension)   . Hx of breast reduction, elective   . Hx: UTI (urinary tract infection)   . Infection    UTI DURING PREGNANCY  . Infection    YEAST WITH PREGNANCY  . Infection    CHLAMYDIA X 1  . Knee pain   . 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 Low iron   . PIH (pregnancy induced hypertension) 11-2012   Today pp one month.  B/P nl, and 122/76 on repeat-sitting  . Postpartum depression 2011   NO MEDS  . Pregnancy induced hypertension    ALL PREGNANCIES  . Shortness of breath    WITH EXERTION SICNE PREGNANCY  . Tachycardia   . Tachycardia 2010   HAS HAD WORKUP. UNSURE IF HAS POSSIBLE HEART VALVE ISSUE  . Vulvitis 2009    PAST SURGICAL HISTORY: Past Surgical History:  Procedure Laterality Date  . BREAST REDUCTION SURGERY  2008  . BREAST SURGERY    . DILATION AND  CURETTAGE OF UTERUS    . FINGER SURGERY     Right index finger  . TUBAL LIGATION  01/09/2013   Procedure: POST PARTUM TUBAL LIGATION;  Surgeon: Michael Litter, MD;  Location: [facility];  Service: Gynecology;  Laterality: Bilateral;  post partum tubal ligation bilateral  . US ECHOCARDIOGRAPHY  06/18/2009   ef 55-60%  . WISDOM TOOTH EXTRACTION      SOCIAL HISTORY: Social History   Tobacco Use  . Smoking status: Passive Smoke Exposure - Never Smoker  . Smokeless tobacco: Never Used  Substance Use Topics  . Alcohol use: Yes    Comment: socially  . Drug use: No    FAMILY HISTORY: Family History  Problem Relation Age of Onset  . Hypertension Mother   . Hyperlipidemia Mother   . Coronary artery disease Mother   .  Heart disease Mother        high cholesterol  . Asthma Mother   . Diabetes Mother   . Depression Mother   . Anxiety disorder Mother   . Sleep apnea Mother   . Obesity Mother   . Eating disorder Mother   . Hypertension Father   . Hypertension Maternal Grandmother   . Hypertension Maternal Grandfather   . Asthma Daughter   . Alcohol abuse Maternal Uncle   . Drug abuse Maternal Uncle     ROS: Review of Systems  Gastrointestinal: Negative for nausea and vomiting.  Psychiatric/Behavioral: Positive for depression.    PHYSICAL EXAM: Pt in no acute distress  RECENT LABS AND TESTS: BMET    Component Value Date/Time   NA 137 12/17/2018 1522   K 3.9 12/17/2018 1522   CL 99 12/17/2018 1522   CO2 22 12/17/2018 1522   GLUCOSE 85 12/17/2018 1522   GLUCOSE 95 12/05/2017 0747   BUN 15 12/17/2018 1522   CREATININE 0.83 12/17/2018 1522   CREATININE 0.64 01/17/2013 1720   CALCIUM 9.6 12/17/2018 1522   GFRNONAA 90 12/17/2018 1522   GFRAA 103 12/17/2018 1522   Lab Results  Component Value Date   HGBA1C 5.2 09/08/2018   Lab Results  Component Value Date   INSULIN 11.5 09/08/2018   CBC    Component Value Date/Time   WBC 5.8 12/17/2018 1522   WBC 6.0 12/06/2017  0000   RBC 4.04 12/17/2018 1522   RBC 4.52 12/06/2017 0000   HGB 9.7 (L) 12/17/2018 1522   HCT 31.5 (L) 12/17/2018 1522   PLT 363 12/17/2018 1522   MCV 78 (L) 12/17/2018 1522   MCH 24.0 (L) 12/17/2018 1522   MCH 19.7 (L) 12/06/2017 0000   MCHC 30.8 (L) 12/17/2018 1522   MCHC 29.5 (L) 12/06/2017 0000   RDW 14.9 12/17/2018 1522   LYMPHSABS 1.7 12/17/2018 1522   MONOABS 0.3 12/04/2017 1858   EOSABS 0.1 12/17/2018 1522   BASOSABS 0.1 12/17/2018 1522   Iron/TIBC/Ferritin/ %Sat    Component Value Date/Time   IRON 29 12/17/2018 1522   TIBC 369 12/17/2018 1522   FERRITIN 7 (L) 12/17/2018 1522   IRONPCTSAT 8 (LL) 12/17/2018 1522   Lipid Panel     Component Value Date/Time   CHOL 213 (H) 12/17/2018 1522   TRIG 87 12/17/2018 1522   HDL 71 12/17/2018 1522   CHOLHDL 3.0 12/17/2018 1522   LDLCALC 125 (H) 12/17/2018 1522   Hepatic Function Panel     Component Value Date/Time   PROT 7.7 09/08/2018 1042   ALBUMIN 4.5 09/08/2018 1042   AST 25 09/08/2018 1042   ALT 30 09/08/2018 1042   ALKPHOS 78 09/08/2018 1042   BILITOT 0.8 09/08/2018 1042      Component Value Date/Time   TSH 0.755 09/08/2018 1042   Results for Sneath, Astrid (MRN [id number]) as of 03/16/2019 10:40  Ref. Range 09/08/2018 10:42  Vitamin D, 25-Hydroxy Latest Ref Range: 30.0 - 100.0 ng/mL 9.6 (L)     I, Kirke Corin, CMA, am acting as transcriptionist for Wilder Glade, MD  I have reviewed the above documentation for accuracy and completeness, and I agree with the above. -Quillian Quince, MD

## 2019-04-03 ENCOUNTER — Other Ambulatory Visit (INDEPENDENT_AMBULATORY_CARE_PROVIDER_SITE_OTHER): Payer: Self-pay | Admitting: Family Medicine

## 2019-04-03 DIAGNOSIS — E559 Vitamin D deficiency, unspecified: Secondary | ICD-10-CM

## 2019-04-05 ENCOUNTER — Other Ambulatory Visit: Payer: Self-pay

## 2019-04-05 ENCOUNTER — Encounter (INDEPENDENT_AMBULATORY_CARE_PROVIDER_SITE_OTHER): Payer: Self-pay | Admitting: Family Medicine

## 2019-04-05 ENCOUNTER — Ambulatory Visit (INDEPENDENT_AMBULATORY_CARE_PROVIDER_SITE_OTHER): Payer: 59 | Admitting: Family Medicine

## 2019-04-05 ENCOUNTER — Other Ambulatory Visit (INDEPENDENT_AMBULATORY_CARE_PROVIDER_SITE_OTHER): Payer: Self-pay | Admitting: Family Medicine

## 2019-04-05 DIAGNOSIS — Z6833 Body mass index (BMI) 33.0-33.9, adult: Secondary | ICD-10-CM | POA: Diagnosis not present

## 2019-04-05 DIAGNOSIS — E559 Vitamin D deficiency, unspecified: Secondary | ICD-10-CM

## 2019-04-05 DIAGNOSIS — F3289 Other specified depressive episodes: Secondary | ICD-10-CM

## 2019-04-05 DIAGNOSIS — E669 Obesity, unspecified: Secondary | ICD-10-CM

## 2019-04-05 MED ORDER — VITAMIN D (ERGOCALCIFEROL) 1.25 MG (50000 UNIT) PO CAPS: 50000.0000 [IU] | ORAL_CAPSULE | ORAL | 0 refills | Status: DC

## 2019-04-05 NOTE — Progress Notes (Signed)
Office: [phone]  /  Fax: [phone] TeleHealth Visit:  Patricia Lin has verbally consented to this TeleHealth visit today. The patient is located at home, the provider is located at the [facility] office. The participants in this visit include the listed provider and patient. The visit was conducted today via Face Time.  HPI:   Chief Complaint: OBESITY Patricia Lin is here to discuss her progress with her obesity treatment plan. She is on the Category 3 plan and is following her eating plan approximately 0 % of the time. She states she is walking around the neighborhood and playing with the kids. Patricia Lin is still struggling to follow her plan. She is not meal planning or prepping for herself and she is still eating for stress and comfort.  We were unable to weigh the patient today for this TeleHealth visit. She feels as if she has gained weight since her last visit. She has lost 6 lbs since starting treatment with [place].  Vitamin D Deficiency Patricia Lin has a diagnosis of vitamin D deficiency. She is currently stable on vit D. Patricia Lin denies nausea, vomiting, or muscle weakness.  Depression with emotional eating behaviors Patricia Lin had increased Topamax to 100 mg at her last visit. She took it for 3 days and "felt funny", but couldn't elaborate further. She stopped taking it after 3 days. She is struggling with emotional eating and using food for comfort to the extent that it is negatively impacting her health. She often snacks when she is not hungry. Patricia Lin sometimes feels she is out of control and then feels guilty that she made poor food choices. She has been working on behavior modification techniques to help reduce her emotional eating and has been somewhat successful.   ASSESSMENT AND PLAN:  Vitamin D deficiency - Plan: Vitamin D, Ergocalciferol, (DRISDOL) 1.25 MG (50000 UT) CAPS capsule  Other depression - with emotional eating  Class 1 obesity with serious comorbidity and body  mass index (BMI) of 33.0 to 33.9 in adult, unspecified obesity type  PLAN:  Vitamin D Deficiency Patricia Lin was informed that low vitamin D levels contribute to fatigue and are associated with obesity, breast, and colon cancer. Patricia Lin agrees to continue to take prescription Vit D @50 ,000 IU every week #4 with no refills and will follow up for routine testing of vitamin D, at least 2-3 times per year. She was informed of the risk of over-replacement of vitamin D and agrees to not increase her dose unless she discusses this with [place] first. Patricia Lin agrees to follow up in 2 weeks as directed.  Depression with Emotional Eating Behaviors We discussed behavior modification techniques today to help Patricia Lin deal with her emotional eating and depression. She has agreed to restart topamax 100 mg qd, 1/2 pill (50 mg) per day with no refills needed and agreed to follow up as directed.  Obesity Patricia Lin is currently in the action stage of change. As such, her goal is to maintain weight for now until out of isolation. She has agreed to keep a food journal with 1400 to 1600 calories and 85+ grams of protein.  Patricia Lin has been instructed to work up to a goal of 150 minutes of combined cardio and strengthening exercise per week for weight loss and overall health benefits. We discussed the following Behavioral Modification Strategies today: increasing lean protein intake, decreasing simple carbohydrates, keep a strict food journal, and work on meal planning and easy cooking plans.  Patricia Lin has agreed to follow up with our  clinic in 2 weeks. She was informed of the importance of frequent follow up visits to maximize her success with intensive lifestyle modifications for her multiple health conditions.  ALLERGIES: Allergies  Allergen Reactions  . Latex Itching and Rash    MEDICATIONS: Current Outpatient Medications on File Prior to Visit  Medication Sig Dispense Refill  . ferrous sulfate 325 (65 FE) MG tablet Take 1  tablet (325 mg total) by mouth 3 (three) times daily with meals. 90 tablet 0  . hydrOXYzine (ATARAX/VISTARIL) 25 MG tablet Take 1 tablet (25 mg total) by mouth at bedtime as needed for anxiety. 30 tablet 0  . topiramate (TOPAMAX) 100 MG tablet Take 1 tablet (100 mg total) by mouth daily. 30 tablet 0  . Vitamin D, Ergocalciferol, (DRISDOL) 1.25 MG (50000 UT) CAPS capsule Take 1 capsule (50,000 Units total) by mouth every 7 (seven) days. 4 capsule 0   No current facility-administered medications on file prior to visit.     PAST MEDICAL HISTORY: Past Medical History:  Diagnosis Date  . Abnormal Pap smear   . Anemia   . Anxiety   . Back pain   . BV (bacterial vaginosis) 2011  . Chlamydia infection   . CIN I (cervical intraepithelial neoplasia I) 2008  . Depression   . Dyspnea   . Dysrhythmia 2009   TACHYCARDIA;   HAD W/U 2010? POSSIBLE HEART VALVE ISSUE; NOT F/U NEEDED X 3 YEARS PER PT  . Dysuria 2010  . Fatigue   . H/O pre-eclampsia in prior pregnancy, currently pregnant   . H/O varicella   . H/O vitamin D deficiency   . Headache(784.0)    MIGRAINES  . HTN (hypertension)   . Hx of breast reduction, elective   . Hx: UTI (urinary tract infection)   . Infection    UTI DURING PREGNANCY  . Infection    YEAST WITH PREGNANCY  . Infection    CHLAMYDIA X 1  . Knee pain   . 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 Low iron   . PIH (pregnancy induced hypertension) 11-2012   Today pp one month.  B/P nl, and 122/76 on repeat-sitting  . Postpartum depression 2011   NO MEDS  . Pregnancy induced hypertension    ALL PREGNANCIES  . Shortness of breath    WITH EXERTION SICNE PREGNANCY  . Tachycardia   . Tachycardia 2010   HAS HAD WORKUP. UNSURE IF HAS POSSIBLE HEART VALVE ISSUE  . Vulvitis 2009    PAST SURGICAL HISTORY: Past Surgical History:  Procedure Laterality Date  . BREAST REDUCTION SURGERY  2008  . BREAST SURGERY    .  DILATION AND CURETTAGE OF UTERUS    . FINGER SURGERY     Right index finger  . TUBAL LIGATION  01/09/2013   Procedure: POST PARTUM TUBAL LIGATION;  Surgeon: Michael Litter, MD;  Location: [facility];  Service: Gynecology;  Laterality: Bilateral;  post partum tubal ligation bilateral  . US ECHOCARDIOGRAPHY  06/18/2009   ef 55-60%  . WISDOM TOOTH EXTRACTION      SOCIAL HISTORY: Social History   Tobacco Use  . Smoking status: Passive Smoke Exposure - Never Smoker  . Smokeless tobacco: Never Used  Substance Use Topics  . Alcohol use: Yes    Comment: socially  . Drug use: No    FAMILY HISTORY: Family History  Problem Relation Age of Onset  . Hypertension Mother   .  Hyperlipidemia Mother   . Coronary artery disease Mother   . Heart disease Mother        high cholesterol  . Asthma Mother   . Diabetes Mother   . Depression Mother   . Anxiety disorder Mother   . Sleep apnea Mother   . Obesity Mother   . Eating disorder Mother   . Hypertension Father   . Hypertension Maternal Grandmother   . Hypertension Maternal Grandfather   . Asthma Daughter   . Alcohol abuse Maternal Uncle   . Drug abuse Maternal Uncle     ROS: Review of Systems  Gastrointestinal: Negative for nausea and vomiting.  Musculoskeletal:       Negative for muscle weakness.  Psychiatric/Behavioral: Positive for depression.    PHYSICAL EXAM: Pt in no acute distress  RECENT LABS AND TESTS: BMET    Component Value Date/Time   NA 137 12/17/2018 1522   K 3.9 12/17/2018 1522   CL 99 12/17/2018 1522   CO2 22 12/17/2018 1522   GLUCOSE 85 12/17/2018 1522   GLUCOSE 95 12/05/2017 0747   BUN 15 12/17/2018 1522   CREATININE 0.83 12/17/2018 1522   CREATININE 0.64 01/17/2013 1720   CALCIUM 9.6 12/17/2018 1522   GFRNONAA 90 12/17/2018 1522   GFRAA 103 12/17/2018 1522   Lab Results  Component Value Date   HGBA1C 5.2 09/08/2018   Lab Results  Component Value Date   INSULIN 11.5 09/08/2018   CBC     Component Value Date/Time   WBC 5.8 12/17/2018 1522   WBC 6.0 12/06/2017 0000   RBC 4.04 12/17/2018 1522   RBC 4.52 12/06/2017 0000   HGB 9.7 (L) 12/17/2018 1522   HCT 31.5 (L) 12/17/2018 1522   PLT 363 12/17/2018 1522   MCV 78 (L) 12/17/2018 1522   MCH 24.0 (L) 12/17/2018 1522   MCH 19.7 (L) 12/06/2017 0000   MCHC 30.8 (L) 12/17/2018 1522   MCHC 29.5 (L) 12/06/2017 0000   RDW 14.9 12/17/2018 1522   LYMPHSABS 1.7 12/17/2018 1522   MONOABS 0.3 12/04/2017 1858   EOSABS 0.1 12/17/2018 1522   BASOSABS 0.1 12/17/2018 1522   Iron/TIBC/Ferritin/ %Sat    Component Value Date/Time   IRON 29 12/17/2018 1522   TIBC 369 12/17/2018 1522   FERRITIN 7 (L) 12/17/2018 1522   IRONPCTSAT 8 (LL) 12/17/2018 1522   Lipid Panel     Component Value Date/Time   CHOL 213 (H) 12/17/2018 1522   TRIG 87 12/17/2018 1522   HDL 71 12/17/2018 1522   CHOLHDL 3.0 12/17/2018 1522   LDLCALC 125 (H) 12/17/2018 1522   Hepatic Function Panel     Component Value Date/Time   PROT 7.7 09/08/2018 1042   ALBUMIN 4.5 09/08/2018 1042   AST 25 09/08/2018 1042   ALT 30 09/08/2018 1042   ALKPHOS 78 09/08/2018 1042   BILITOT 0.8 09/08/2018 1042      Component Value Date/Time   TSH 0.755 09/08/2018 1042   Results for Doolen, Lauralye (MRN [id number]) as of 04/05/2019 15:58  Ref. Range 09/08/2018 10:42  Vitamin D, 25-Hydroxy Latest Ref Range: 30.0 - 100.0 ng/mL 9.6 (L)     I, Kirke Corinara Soares, CMA, am acting as transcriptionist for Wilder Gladearen D. Mariona Scholes, MD I have reviewed the above documentation for accuracy and completeness, and I agree with the above. -Quillian Quincearen Elder Davidian, MD

## 2019-04-06 ENCOUNTER — Telehealth (HOSPITAL_COMMUNITY): Payer: Self-pay | Admitting: Psychiatry

## 2019-04-06 NOTE — Telephone Encounter (Signed)
D:  Pt phoned and left vm that she would like to join MH-IOP now.  Her therapist Hilbert Odor, [place]) had referred her in March 2020, but pt couldn't do it then.  A:  Placed call to re-orient pt and provide her with a start date, but there was no answer.  Left vm for pt to call writer back.  Inform [employer], LCSW.

## 2019-04-11 ENCOUNTER — Telehealth (HOSPITAL_COMMUNITY): Payer: Self-pay | Admitting: Psychiatry

## 2019-04-14 ENCOUNTER — Telehealth (HOSPITAL_COMMUNITY): Payer: Self-pay | Admitting: Psychiatry

## 2019-04-18 ENCOUNTER — Encounter (HOSPITAL_COMMUNITY): Payer: Self-pay | Admitting: Family

## 2019-04-18 ENCOUNTER — Other Ambulatory Visit: Payer: Self-pay

## 2019-04-18 ENCOUNTER — Other Ambulatory Visit (HOSPITAL_COMMUNITY): Payer: 59 | Attending: Psychiatry | Admitting: Licensed Clinical Social Worker

## 2019-04-18 DIAGNOSIS — Z8249 Family history of ischemic heart disease and other diseases of the circulatory system: Secondary | ICD-10-CM | POA: Diagnosis not present

## 2019-04-18 DIAGNOSIS — Z79899 Other long term (current) drug therapy: Secondary | ICD-10-CM | POA: Insufficient documentation

## 2019-04-18 DIAGNOSIS — E559 Vitamin D deficiency, unspecified: Secondary | ICD-10-CM | POA: Insufficient documentation

## 2019-04-18 DIAGNOSIS — F329 Major depressive disorder, single episode, unspecified: Secondary | ICD-10-CM | POA: Diagnosis present

## 2019-04-18 DIAGNOSIS — F419 Anxiety disorder, unspecified: Secondary | ICD-10-CM | POA: Insufficient documentation

## 2019-04-18 DIAGNOSIS — I10 Essential (primary) hypertension: Secondary | ICD-10-CM | POA: Insufficient documentation

## 2019-04-18 DIAGNOSIS — F330 Major depressive disorder, recurrent, mild: Secondary | ICD-10-CM

## 2019-04-18 DIAGNOSIS — Z818 Family history of other mental and behavioral disorders: Secondary | ICD-10-CM | POA: Diagnosis not present

## 2019-04-18 DIAGNOSIS — Z7722 Contact with and (suspected) exposure to environmental tobacco smoke (acute) (chronic): Secondary | ICD-10-CM | POA: Insufficient documentation

## 2019-04-18 DIAGNOSIS — D649 Anemia, unspecified: Secondary | ICD-10-CM | POA: Insufficient documentation
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
## 2019-04-19 ENCOUNTER — Other Ambulatory Visit: Payer: Self-pay

## 2019-04-19 ENCOUNTER — Other Ambulatory Visit (HOSPITAL_COMMUNITY): Payer: 59 | Admitting: Licensed Clinical Social Worker

## 2019-04-19 ENCOUNTER — Encounter (INDEPENDENT_AMBULATORY_CARE_PROVIDER_SITE_OTHER): Payer: Self-pay | Admitting: Family Medicine

## 2019-04-19 ENCOUNTER — Ambulatory Visit (INDEPENDENT_AMBULATORY_CARE_PROVIDER_SITE_OTHER): Payer: 59 | Admitting: Family Medicine

## 2019-04-19 DIAGNOSIS — E669 Obesity, unspecified: Secondary | ICD-10-CM

## 2019-04-19 DIAGNOSIS — F329 Major depressive disorder, single episode, unspecified: Secondary | ICD-10-CM | POA: Diagnosis not present

## 2019-04-19 DIAGNOSIS — Z6833 Body mass index (BMI) 33.0-33.9, adult: Secondary | ICD-10-CM

## 2019-04-19 DIAGNOSIS — R030 Elevated blood-pressure reading, without diagnosis of hypertension: Secondary | ICD-10-CM | POA: Diagnosis not present

## 2019-04-19 DIAGNOSIS — F330 Major depressive disorder, recurrent, mild: Secondary | ICD-10-CM

## 2019-04-19 NOTE — Progress Notes (Signed)
Virtual Visit via Video Note  I connected with Patricia Lin on 04/19/19 at  9:00 AM EDT by a video enabled telemedicine application and verified that I am speaking with the correct person using two identifiers.   I discussed the limitations of evaluation and management by telemedicine and the availability of in person appointments. The patient expressed understanding and agreed to proceed.  I discussed the assessment and treatment plan with the patient. The patient was provided an opportunity to ask questions and all were answered. The patient agreed with the plan and demonstrated an understanding of the instructions.   The patient was advised to call back or seek an in-person evaluation if the symptoms worsen or if the condition fails to improve as anticipated.  I provided 180 minutes of non-face-to-face time during this encounter.   [name], LCSW     Daily Group Progress Note  Program: IOP  Group Time: 9am-12pm  Participation Level: Active  Behavioral Response: Appropriate  Type of Therapy:  Group Therapy; process group, psycho-educational group  Summary of Progress:  The purpose of this group is to utilize CBT and DBT skills in a telehealth group setting to increase use of healthy coping skills and decrease frequency and intensity of active mental health symptoms.  9am-10:30am Clinician checked in with group members, assessing for SI/HI/psychosis and overall level of functioning. Clinician and clients processed stressors from the previous day and how they were addressed. Clinician and group members processed anger as a secondary emotion. Clinician and group members discussed how angry might be expressed based on what was learned growing up, and which emotions sometimes present as anger. Clinician presented values clarification activity with clients and processed results and their effect on decision making, communication styles, and relationships. 10:30am-12pm Clinician  presented CBT skills of assertive communication. Clinician and group members reviewed [employer] and engaging in discussions without escalating self or others. Clinician and group members practiced utilizing I-statements with [employer] to identify and share personal thoughts, feelings, and needs while avoiding attacking others. Clinician checked out with group members inquiring about self-care activity planned for the day. Client engaged in group discussions. Client processed with group thoughts/feelings related to husbands substance use. Client was receptive to education about co-dependency and benefits of al-anon. Client plans to attend I-statements with her husband to avoid escalated communication later this evening.  [name], LCSW

## 2019-04-20 ENCOUNTER — Telehealth (HOSPITAL_COMMUNITY): Payer: Self-pay | Admitting: Psychiatry

## 2019-04-20 ENCOUNTER — Other Ambulatory Visit: Payer: Self-pay

## 2019-04-20 ENCOUNTER — Other Ambulatory Visit (HOSPITAL_COMMUNITY): Payer: 59 | Admitting: Psychiatry

## 2019-04-20 NOTE — Progress Notes (Signed)
Virtual Visit via Video Note  I connected with Patricia Lin on 04/18/2019 at  9:00 AM EDT by a video enabled telemedicine application and verified that I am speaking with the correct person using two identifiers.   I discussed the limitations of evaluation and management by telemedicine and the availability of in person appointments. The patient expressed understanding and agreed to proceed.   I discussed the assessment and treatment plan with the patient. The patient was provided an opportunity to ask questions and all were answered. The patient agreed with the plan and demonstrated an understanding of the instructions.   The patient was advised to call back or seek an in-person evaluation if the symptoms worsen or if the condition fails to improve as anticipated.  I provided 180 minutes of non-face-to-face time during this encounter.   [name], LCSW     Daily Group Progress Note  Program: IOP  Group Time: 9am-12pm  Participation Level: Active  Behavioral Response: Appropriate  Type of Therapy:  Group Therapy; process group, psycho-educational group  Summary of Progress:  The purpose of this group is to utilize CBT and DBT skills in a group setting to increase use of healthy coping skills and decrease frequency and intensity of active mental health symptoms.  9am-10:30am Clinician presented the topic for process of Guilt vs Shame. Clinician and group members processed healthy vs unhealthy guilt vs shame. Clinician facilitated processing group on causes, outcomes, and resolutions with how to address feelings and associated behaviors including making amends and practicing self-compassion. Clinician validated client feelings and challenged thought distortions. 10:30am-12pm Clinician presented the psycho-educational skill of Radical Acceptance. Clinician and group discussed what realities need to accepted, why, and barriers to acceptance. Clinician and group members reviewed  Ruminating Thoughts and addressing these with using Brain dump and going back to identify and challenge cognitive distortions and create realistic thoughts. See Case Management and NP notes for additional details. Client engaged in group discussions, sharing struggles with family dynamics. Client was open to feedback from group members.  [name], LCSW

## 2019-04-20 NOTE — Progress Notes (Signed)
Office: [phone]  /  Fax: [phone] TeleHealth Visit:  Patricia Lin has verbally consented to this TeleHealth visit today. The patient is located at home, the provider is located at the [facility] office. The participants in this visit include the listed provider and patient. The visit was conducted today via Face Time.  HPI:   Chief Complaint: OBESITY Patricia Lin is here to discuss her progress with her obesity treatment plan. She is keeping a food journal with 1400 to 1600 calories and 85+ grams of protein and is following her eating plan approximately 0 % of the time. She states she is exercising 0 minutes 0 times per week. Patricia Lin has not been able to concentrate on weight loss and feels that she has been gaining weight. She notes increased stress at home and at work, but thinks that she is ready to get back on track. She would like to give food journaling a try again.  We were unable to weigh the patient today for this TeleHealth visit. She feels as if she has gained weight since her last visit. She has lost 6 lbs since starting treatment with us.  Elevated Blood Pressure without History of Hypertension Patricia Lin's blood pressure at home has been elevated at 160/100 last week and 144/91 this week. Her blood pressure readings in the office were within normal limits previously. She has increased her sodium intake and had gained weight which may contribute.  ASSESSMENT AND PLAN:  Blood pressure elevated without history of HTN  Class 1 obesity with serious comorbidity and body mass index (BMI) of 33.0 to 33.9 in adult, unspecified obesity type  PLAN:  Elevated Blood Pressure without History of Hypertension Patricia Lin agrees to reduce her sodium intake and to get back to her diet. She will continue to check her blood pressure and follow up in 2 weeks. Patricia Lin agrees with this plan.  I spent > than 50% of the 15 minute visit on counseling as documented in the note.  Obesity  Patricia Lin is currently in the action stage of change. As such, her goal is to continue with weight loss efforts. She has agreed to keep a food journal with 1400 to 1600 calories and 85+ grams of protein daily. Patricia Lin has been instructed to work up to a goal of 150 minutes of combined cardio and strengthening exercise per week for weight loss and overall health benefits. We discussed the following Behavioral Modification Strategies today: increasing lean protein intake, increase H2O intake, decreasing simple carbohydrates, decreasing sodium intake, work on meal planning and easy cooking plans, and emotional eating strategies.  Patricia Lin has agreed to follow up with our clinic in 2 weeks. She was informed of the importance of frequent follow up visits to maximize her success with intensive lifestyle modifications for her multiple health conditions.  ALLERGIES: Allergies  Allergen Reactions  . Latex Itching and Rash    MEDICATIONS: Current Outpatient Medications on File Prior to Visit  Medication Sig Dispense Refill  . ferrous sulfate 325 (65 FE) MG tablet Take 1 tablet (325 mg total) by mouth 3 (three) times daily with meals. 90 tablet 0  . hydrOXYzine (ATARAX/VISTARIL) 25 MG tablet Take 1 tablet (25 mg total) by mouth at bedtime as needed for anxiety. 30 tablet 0  . topiramate (TOPAMAX) 25 MG tablet Take 25 mg by mouth daily.    . Vitamin D, Ergocalciferol, (DRISDOL) 1.25 MG (50000 UT) CAPS capsule Take 1 capsule (50,000 Units total) by mouth every 7 (seven) days. 4  capsule 0   No current facility-administered medications on file prior to visit.     PAST MEDICAL HISTORY: Past Medical History:  Diagnosis Date  . Abnormal Pap smear   . Anemia   . Anxiety   . Back pain   . BV (bacterial vaginosis) 2011  . Chlamydia infection   . CIN I (cervical intraepithelial neoplasia I) 2008  . Depression   . Dyspnea   . Dysrhythmia 2009   TACHYCARDIA;   HAD W/U 2010? POSSIBLE HEART VALVE ISSUE; NOT  F/U NEEDED X 3 YEARS PER PT  . Dysuria 2010  . Fatigue   . H/O pre-eclampsia in prior pregnancy, currently pregnant   . H/O varicella   . H/O vitamin D deficiency   . Headache(784.0)    MIGRAINES  . HTN (hypertension)   . Hx of breast reduction, elective   . Hx: UTI (urinary tract infection)   . Infection    UTI DURING PREGNANCY  . Infection    YEAST WITH PREGNANCY  . Infection    CHLAMYDIA X 1  . Knee pain   . 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 Low iron   . PIH (pregnancy induced hypertension) 11-2012   Today pp one month.  B/P nl, and 122/76 on repeat-sitting  . Postpartum depression 2011   NO MEDS  . Pregnancy induced hypertension    ALL PREGNANCIES  . Shortness of breath    WITH EXERTION SICNE PREGNANCY  . Tachycardia   . Tachycardia 2010   HAS HAD WORKUP. UNSURE IF HAS POSSIBLE HEART VALVE ISSUE  . Vulvitis 2009    PAST SURGICAL HISTORY: Past Surgical History:  Procedure Laterality Date  . BREAST REDUCTION SURGERY  2008  . BREAST SURGERY    . DILATION AND CURETTAGE OF UTERUS    . FINGER SURGERY     Right index finger  . TUBAL LIGATION  01/09/2013   Procedure: POST PARTUM TUBAL LIGATION;  Surgeon: Michael Litter, MD;  Location: [facility];  Service: Gynecology;  Laterality: Bilateral;  post partum tubal ligation bilateral  . US ECHOCARDIOGRAPHY  06/18/2009   ef 55-60%  . WISDOM TOOTH EXTRACTION      SOCIAL HISTORY: Social History   Tobacco Use  . Smoking status: Passive Smoke Exposure - Never Smoker  . Smokeless tobacco: Never Used  Substance Use Topics  . Alcohol use: Yes    Comment: socially  . Drug use: No    FAMILY HISTORY: Family History  Problem Relation Age of Onset  . Hypertension Mother   . Hyperlipidemia Mother   . Coronary artery disease Mother   . Heart disease Mother        high cholesterol  . Asthma Mother   . Diabetes Mother   . Depression Mother   . Anxiety disorder Mother    . Sleep apnea Mother   . Obesity Mother   . Eating disorder Mother   . Hypertension Father   . Hypertension Maternal Grandmother   . Hypertension Maternal Grandfather   . Asthma Daughter   . Alcohol abuse Maternal Uncle   . Drug abuse Maternal Uncle     ROS: ROS  PHYSICAL EXAM: Pt in no acute distress  RECENT LABS AND TESTS: BMET    Component Value Date/Time   NA 137 12/17/2018 1522   K 3.9 12/17/2018 1522   CL 99 12/17/2018 1522   CO2 22 12/17/2018 1522   GLUCOSE 85  12/17/2018 1522   GLUCOSE 95 12/05/2017 0747   BUN 15 12/17/2018 1522   CREATININE 0.83 12/17/2018 1522   CREATININE 0.64 01/17/2013 1720   CALCIUM 9.6 12/17/2018 1522   GFRNONAA 90 12/17/2018 1522   GFRAA 103 12/17/2018 1522   Lab Results  Component Value Date   HGBA1C 5.2 09/08/2018   Lab Results  Component Value Date   INSULIN 11.5 09/08/2018   CBC    Component Value Date/Time   WBC 5.8 12/17/2018 1522   WBC 6.0 12/06/2017 0000   RBC 4.04 12/17/2018 1522   RBC 4.52 12/06/2017 0000   HGB 9.7 (L) 12/17/2018 1522   HCT 31.5 (L) 12/17/2018 1522   PLT 363 12/17/2018 1522   MCV 78 (L) 12/17/2018 1522   MCH 24.0 (L) 12/17/2018 1522   MCH 19.7 (L) 12/06/2017 0000   MCHC 30.8 (L) 12/17/2018 1522   MCHC 29.5 (L) 12/06/2017 0000   RDW 14.9 12/17/2018 1522   LYMPHSABS 1.7 12/17/2018 1522   MONOABS 0.3 12/04/2017 1858   EOSABS 0.1 12/17/2018 1522   BASOSABS 0.1 12/17/2018 1522   Iron/TIBC/Ferritin/ %Sat    Component Value Date/Time   IRON 29 12/17/2018 1522   TIBC 369 12/17/2018 1522   FERRITIN 7 (L) 12/17/2018 1522   IRONPCTSAT 8 (LL) 12/17/2018 1522   Lipid Panel     Component Value Date/Time   CHOL 213 (H) 12/17/2018 1522   TRIG 87 12/17/2018 1522   HDL 71 12/17/2018 1522   CHOLHDL 3.0 12/17/2018 1522   LDLCALC 125 (H) 12/17/2018 1522   Hepatic Function Panel     Component Value Date/Time   PROT 7.7 09/08/2018 1042   ALBUMIN 4.5 09/08/2018 1042   AST 25 09/08/2018 1042    ALT 30 09/08/2018 1042   ALKPHOS 78 09/08/2018 1042   BILITOT 0.8 09/08/2018 1042      Component Value Date/Time   TSH 0.755 09/08/2018 1042    Results for Lin, Patricia (MRN [id number]) as of 04/20/2019 11:21  Ref. Range 09/08/2018 10:42  Vitamin D, 25-Hydroxy Latest Ref Range: 30.0 - 100.0 ng/mL 9.6 (L)    I, Kirke Corin, CMA, am acting as transcriptionist for Wilder Glade, MD I have reviewed the above documentation for accuracy and completeness, and I agree with the above. -Quillian Quince, MD

## 2019-04-21 ENCOUNTER — Other Ambulatory Visit (HOSPITAL_COMMUNITY): Payer: 59 | Admitting: Licensed Clinical Social Worker

## 2019-04-21 ENCOUNTER — Other Ambulatory Visit: Payer: Self-pay

## 2019-04-21 DIAGNOSIS — F329 Major depressive disorder, single episode, unspecified: Secondary | ICD-10-CM | POA: Diagnosis not present

## 2019-04-21 DIAGNOSIS — F330 Major depressive disorder, recurrent, mild: Secondary | ICD-10-CM

## 2019-04-22 ENCOUNTER — Other Ambulatory Visit (HOSPITAL_COMMUNITY): Payer: 59 | Admitting: Licensed Clinical Social Worker

## 2019-04-22 ENCOUNTER — Other Ambulatory Visit: Payer: Self-pay

## 2019-04-22 DIAGNOSIS — F329 Major depressive disorder, single episode, unspecified: Secondary | ICD-10-CM | POA: Diagnosis not present

## 2019-04-22 DIAGNOSIS — F411 Generalized anxiety disorder: Secondary | ICD-10-CM

## 2019-04-22 DIAGNOSIS — F330 Major depressive disorder, recurrent, mild: Secondary | ICD-10-CM

## 2019-04-25 ENCOUNTER — Other Ambulatory Visit: Payer: Self-pay

## 2019-04-25 ENCOUNTER — Other Ambulatory Visit (HOSPITAL_COMMUNITY): Payer: 59 | Admitting: Psychiatry

## 2019-04-25 DIAGNOSIS — F330 Major depressive disorder, recurrent, mild: Secondary | ICD-10-CM

## 2019-04-25 DIAGNOSIS — F329 Major depressive disorder, single episode, unspecified: Secondary | ICD-10-CM | POA: Diagnosis not present

## 2019-04-25 DIAGNOSIS — F411 Generalized anxiety disorder: Secondary | ICD-10-CM

## 2019-04-25 NOTE — Progress Notes (Signed)
Virtual Visit via Video Note  I connected with Patricia Lin on 04/21/2019 at  9:00 AM EDT by a video enabled telemedicine application and verified that I am speaking with the correct person using two identifiers.   I discussed the limitations of evaluation and management by telemedicine and the availability of in person appointments. The patient expressed understanding and agreed to proceed.  I discussed the assessment and treatment plan with the patient. The patient was provided an opportunity to ask questions and all were answered. The patient agreed with the plan and demonstrated an understanding of the instructions.   The patient was advised to call back or seek an in-person evaluation if the symptoms worsen or if the condition fails to improve as anticipated.  I provided 180 minutes of non-face-to-face time during this encounter.   Olegario Messier, LCSW     Daily Group Progress Note  Program: IOP  Group Time: 9am-12pm  Participation Level: Active  Behavioral Response: Appropriate and Sharing  Type of Therapy:  Group Therapy; process group, psycho-educational group  Summary of Progress:  The purpose of this group is to utilize CBT and DBT skills in a group setting to increase use of healthy coping skills and decrease frequency and intensity of active mental health symtpoms. 9am-10:30am Clinician met with group members via telehealth to maintain health and safety measures due to pandemic. Clinician inquired about self care activities from the previous day. Clinician presented the topic of Healthy Boundaries. Clinician and group members reviewed types of boundaries, healthy vs unhealthy signs and discussed tips for setting healthy boundaries. Clinician and group members role played setting healthy boundaries with self, family, and co-workers. 108:30am-12pm Clinician and group members reviewed [place] of Rights and discussed causes and concerns with co-dependency. Client  continues to report dysfunctional relationship with husband, including poor boundaries. Client practiced setting boundaries with co-workers, being able to role play interactions despite feeling uncomfortable. 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 Client is open to [employer].  Olegario Messier, LCSW

## 2019-04-26 ENCOUNTER — Other Ambulatory Visit: Payer: Self-pay

## 2019-04-26 ENCOUNTER — Other Ambulatory Visit (HOSPITAL_COMMUNITY): Payer: 59 | Admitting: Licensed Clinical Social Worker

## 2019-04-26 DIAGNOSIS — F411 Generalized anxiety disorder: Secondary | ICD-10-CM

## 2019-04-26 DIAGNOSIS — F330 Major depressive disorder, recurrent, mild: Secondary | ICD-10-CM

## 2019-04-26 DIAGNOSIS — F329 Major depressive disorder, single episode, unspecified: Secondary | ICD-10-CM | POA: Diagnosis not present

## 2019-04-27 ENCOUNTER — Other Ambulatory Visit: Payer: Self-pay

## 2019-04-27 ENCOUNTER — Other Ambulatory Visit (HOSPITAL_COMMUNITY): Payer: 59 | Admitting: Licensed Clinical Social Worker

## 2019-04-27 ENCOUNTER — Other Ambulatory Visit (INDEPENDENT_AMBULATORY_CARE_PROVIDER_SITE_OTHER): Payer: Self-pay | Admitting: Family Medicine

## 2019-04-27 DIAGNOSIS — F411 Generalized anxiety disorder: Secondary | ICD-10-CM

## 2019-04-27 DIAGNOSIS — E559 Vitamin D deficiency, unspecified: Secondary | ICD-10-CM

## 2019-04-27 DIAGNOSIS — F330 Major depressive disorder, recurrent, mild: Secondary | ICD-10-CM

## 2019-04-28 ENCOUNTER — Other Ambulatory Visit: Payer: Self-pay

## 2019-04-28 ENCOUNTER — Other Ambulatory Visit (HOSPITAL_COMMUNITY): Payer: 59 | Admitting: Psychiatry

## 2019-04-28 ENCOUNTER — Encounter (HOSPITAL_COMMUNITY): Payer: Self-pay | Admitting: Psychiatry

## 2019-04-28 DIAGNOSIS — F329 Major depressive disorder, single episode, unspecified: Secondary | ICD-10-CM | POA: Diagnosis not present

## 2019-04-28 DIAGNOSIS — F330 Major depressive disorder, recurrent, mild: Secondary | ICD-10-CM

## 2019-04-28 DIAGNOSIS — F411 Generalized anxiety disorder: Secondary | ICD-10-CM

## 2019-04-28 NOTE — Progress Notes (Addendum)
Virtual Visit via Video Note  I connected with Patricia Lin on 04/26/2019 at  9:00 AM EDT by a video enabled telemedicine application and verified that I am speaking with the correct person using two identifiers.   I discussed the limitations of evaluation and management by telemedicine and the availability of in person appointments. The patient expressed understanding and agreed to proceed.  I discussed the assessment and treatment plan with the patient. The patient was provided an opportunity to ask questions and all were answered. The patient agreed with the plan and demonstrated an understanding of the instructions.   The patient was advised to call back or seek an in-person evaluation if the symptoms worsen or if the condition fails to improve as anticipated.  I provided 180 minutes of non-face-to-face time during this encounter.   [name], LCSW     Daily Group Progress Note  Program: IOP  Group Time: 9am-12pm  Participation Level: Active  Behavioral Response: Appropriate  Type of Therapy:  Group Therapy; process group, psycho-educational group  Summary of Progress:  The purpose of this group is to utilize CBT and DBT skills in a group setting to increase use of healthy coping skills and decrease frequency and intensity of active mental health symptoms. 9am-10:30am Clinician checked in with group members, assessing for SI/HI/psychosis and overall level of functioning. Clinician inquired about self-care activity completed yesterday, and thoughts related to yesterday's session. Clinician presented the topic of Feelings, Thoughts, and Mind Traps. Clinician and group members reviewed traps and processed ways to challenge thoughts. 10:30am-12pm Clinician presented Roadblocks to Healthy Thinking. Clinician and group members reviewed roadblocks and discussed ways to challenge thoughts. Clinician and group members reviwed Developing New Thinking Habits worksheet and Tips For  Breaking the Cycle, including the STOPP skill. Client engaged in group discussions, identifying mind traps and how they are helpful/hurtful in her daily life.   [name], LCSW

## 2019-04-28 NOTE — Progress Notes (Signed)
    Daily Group Progress Note  Program: IOP  Group Time: 9am-12pm  Participation Level: Active  Behavioral Response: Appropriate  Type of Therapy:  Group Therapy; process group, psycho-educational group  Summary of Progress:  The purpose of this group is to utilize CBT and DBT skills in a group setting to increase use of healthy coping skills and decrease frequency and intensity of active mental health symptoms.  9am-10:30am Clinician checked in with clients, assessing for SI/HI/psychosis and overall level of functioning. Process group co-facilitated by Candelaria Stagers, with a focus on loss and grieving losses. Clients identified loss of sense of self as a concern and a goal for therapy.  10:30am-11am Clinician reviewed discharge planning workbook, encouraging clients to review and follow up with therapist or case manager with questions.  11am-12pm Psycho-educational group co-facilitated by staff from [facility], with focus on holistic changes which support mental health growth and sustainment. Topics included sleep hygiene, nutrition, exercise, and self care. Client engaged in group discussion, showing some improvement and insight into attatchment concerns and poor sense of self and self worth based on relationship with her mother. Client notes this also shaped how she behaves with her children, wanting them to know they are loved and taken care of.  [name], LCSW

## 2019-04-28 NOTE — Progress Notes (Signed)
Virtual Visit via Video Note  I connected with Patricia Lin on 04/28/19 at  9:00 AM EDT by a video enabled telemedicine application and verified that I am speaking with the correct person using two identifiers.  Location: Patient: Patricia Lin Provider: Hilbert Odor, LCSW   I discussed the limitations of evaluation and management by telemedicine and the availability of in person appointments. The patient expressed understanding and agreed to proceed.  History of Present Illness: MDD/GAD   Observations/Objective: Patricia Lin was an active and fully engaged participant in group today.. From 9-10:30 we had a guest speaker from the Pharmacy provide psychoeducation on medications. Patricia Lin asked questions and shared concerns with the Pharmacist and reported leaving with a better understanding of the positive effects of anti-depressants. From 10:30-12 the Counselor engaged participants in the "Body Scan" activity and developed lists of questions and concerns to share with doctors at their next appointments regarding their symptoms. Counselor provided psychoeducation on how Trauma Impacts the Brain and provided materials to the participants. Counselor closed group by feedback on takeaways from informaiton covered and shared and with all identifying self-care plans for the day. Patricia Lin denied any current SI/HI or safety concerns.   Assessment and Plan: Patricia Lin will implement skills learned in group, will take medications as prescribed, follow crisis/safety plan and will return tomorrow to group.   Follow Up Instructions: Counselor will send Webex link for tomorrow's session.    I discussed the assessment and treatment plan with the patient. The patient was provided an opportunity to ask questions and all were answered. The patient agreed with the plan and demonstrated an understanding of the instructions.   The patient was advised to call back or seek an in-person evaluation if the symptoms worsen or if the  condition fails to improve as anticipated.  I provided 180 minutes of non-face-to-face time during this encounter.   Hilbert Odor, LCSW

## 2019-04-29 ENCOUNTER — Telehealth: Payer: Self-pay | Admitting: Internal Medicine

## 2019-04-29 ENCOUNTER — Other Ambulatory Visit (HOSPITAL_COMMUNITY): Payer: 59 | Admitting: Licensed Clinical Social Worker

## 2019-04-29 ENCOUNTER — Ambulatory Visit: Payer: 59 | Admitting: Internal Medicine

## 2019-04-29 ENCOUNTER — Other Ambulatory Visit: Payer: Self-pay

## 2019-04-29 NOTE — Telephone Encounter (Signed)
Patient has agreed to do a virtual visit with dr.sanders 5/26

## 2019-05-01 ENCOUNTER — Encounter (HOSPITAL_COMMUNITY): Payer: Self-pay | Admitting: Psychiatry

## 2019-05-01 NOTE — Progress Notes (Signed)
Virtual Visit via Video Note  I connected with Patricia Lin on 05/01/19 at  9:00 AM EDT by a video enabled telemedicine application and verified that I am speaking with the correct person using two identifiers.  Location: Patient: Patricia Lin  Provider: Hilbert Odor, LCSW   I discussed the limitations of evaluation and management by telemedicine and the availability of in person appointments. The patient expressed understanding and agreed to proceed.  History of Present Illness: MDD and GAD   Observations/Objective: Patricia Lin participated well and was engaged with group members and Counselor during group. Patricia Lin shared takeaways from yesterday's group regarding grief and loss in her own life. Counselor provided psychoeducation on the ACEStudy to better understand how childhood traumas are connected with adult medical conditions. Patricia Lin took the questionnaire, shared her results and reflected on her childhood. Counselor then shared a video on [employer] of Resilience and prompted group members to take the Resilience questionnaire. Counselor processed results with the group members. Counselor then transitioned the group to Chair Assisted Yoga with Forde Radon, Counselor and Yoga instructor. Patricia Lin participated in the the guided Yoga activities and provided thoughtful feedback, reporting positive benefits at the end of the session. Counselor assessed self-care ideas with the group in closing.   Assessment and Plan: Counselor recommends Patricia Lin continue in IOP treatment to address mental health symptoms and to build coping skills.   Follow Up Instructions: Counselor will send out Webex link for tomorrows session.    I discussed the assessment and treatment plan with the patient. The patient was provided an opportunity to ask questions and all were answered. The patient agreed with the plan and demonstrated an understanding of the instructions.   The patient was advised to call back or seek an  in-person evaluation if the symptoms worsen or if the condition fails to improve as anticipated.  I provided 180 minutes of non-face-to-face time during this encounter.   Hilbert Odor, LCSW

## 2019-05-03 ENCOUNTER — Other Ambulatory Visit (HOSPITAL_COMMUNITY): Payer: 59 | Admitting: Licensed Clinical Social Worker

## 2019-05-03 ENCOUNTER — Ambulatory Visit (INDEPENDENT_AMBULATORY_CARE_PROVIDER_SITE_OTHER): Payer: 59 | Admitting: Internal Medicine

## 2019-05-03 ENCOUNTER — Other Ambulatory Visit: Payer: Self-pay

## 2019-05-03 ENCOUNTER — Encounter: Payer: Self-pay | Admitting: Internal Medicine

## 2019-05-03 VITALS — BP 136/88 | HR 91 | Ht 62.0 in | Wt 195.0 lb

## 2019-05-03 DIAGNOSIS — G43909 Migraine, unspecified, not intractable, without status migrainosus: Secondary | ICD-10-CM | POA: Diagnosis not present

## 2019-05-03 DIAGNOSIS — F411 Generalized anxiety disorder: Secondary | ICD-10-CM

## 2019-05-03 DIAGNOSIS — E6609 Other obesity due to excess calories: Secondary | ICD-10-CM

## 2019-05-03 DIAGNOSIS — D509 Iron deficiency anemia, unspecified: Secondary | ICD-10-CM | POA: Diagnosis not present

## 2019-05-03 DIAGNOSIS — F330 Major depressive disorder, recurrent, mild: Secondary | ICD-10-CM

## 2019-05-03 DIAGNOSIS — N920 Excessive and frequent menstruation with regular cycle: Secondary | ICD-10-CM | POA: Diagnosis not present

## 2019-05-03 DIAGNOSIS — Z6835 Body mass index (BMI) 35.0-35.9, adult: Secondary | ICD-10-CM

## 2019-05-03 DIAGNOSIS — F329 Major depressive disorder, single episode, unspecified: Secondary | ICD-10-CM | POA: Diagnosis not present

## 2019-05-03 NOTE — Progress Notes (Signed)
Virtual Visit via Video   This visit type was conducted due to national recommendations for restrictions regarding the COVID-19 Pandemic (e.g. social distancing) in an effort to limit this patient's exposure and mitigate transmission in our community.  Due to her co-morbid illnesses, this patient is at least at moderate risk for complications without adequate follow up.  This format is felt to be most appropriate for this patient at this time.  All issues noted in this document were discussed and addressed.  A limited physical exam was performed with this format.    This visit type was conducted due to national recommendations for restrictions regarding the COVID-19 Pandemic (e.g. social distancing) in an effort to limit this patient's exposure and mitigate transmission in our community.  Patients identity confirmed using two different identifiers.  This format is felt to be most appropriate for this patient at this time.  All issues noted in this document were discussed and addressed.  No physical exam was performed (except for noted visual exam findings with Video Visits).    Date:  05/03/2019   ID:  Patricia Lin, DOB [birth year], MRN [id number]  Patient Location:  In car, 3 other people in the car, she was okay with moving forward with the call. She was the passenger.   Provider location:   Office    Chief Complaint:  Migraine f/u History of Present Illness:    Patricia Lin is a 38 y.o. female who presents via video conferencing for a telehealth visit today.    The patient does not have symptoms concerning for COVID-19 infection (fever, chills, cough, or new shortness of breath).   She presents today for virtual visit. She prefers this method of contact due to COVID-19 pandemic.  Migraine   This is a recurrent problem. The current episode started more than 1 month ago. The problem has been gradually improving. The treatment provided moderate relief. Her past medical history is  significant for hypertension and obesity.     Past Medical History:  Diagnosis Date  . Abnormal Pap smear   . Anemia   . Anxiety   . Back pain   . BV (bacterial vaginosis) 2011  . Chlamydia infection   . CIN I (cervical intraepithelial neoplasia I) 2008  . Depression   . Dyspnea   . Dysrhythmia 2009   TACHYCARDIA;   HAD W/U 2010? POSSIBLE HEART VALVE ISSUE; NOT F/U NEEDED X 3 YEARS PER PT  . Dysuria 2010  . Fatigue   . H/O pre-eclampsia in prior pregnancy, currently pregnant   . H/O varicella   . H/O vitamin D deficiency   . Headache(784.0)    MIGRAINES  . HTN (hypertension)   . Hx of breast reduction, elective   . Hx: UTI (urinary tract infection)   . Infection    UTI DURING PREGNANCY  . Infection    YEAST WITH PREGNANCY  . Infection    CHLAMYDIA X 1  . Knee pain   . 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 Low iron   . PIH (pregnancy induced hypertension) 11-2012   Today pp one month.  B/P nl, and 122/76 on repeat-sitting  . Postpartum depression 2011   NO MEDS  . Pregnancy induced hypertension    ALL PREGNANCIES  . Shortness of breath    WITH EXERTION SICNE PREGNANCY  . Tachycardia   . Tachycardia 2010   HAS HAD WORKUP. UNSURE IF HAS POSSIBLE HEART VALVE  ISSUE  . Vulvitis 2009   Past Surgical History:  Procedure Laterality Date  . BREAST REDUCTION SURGERY  2008  . BREAST SURGERY    . DILATION AND CURETTAGE OF UTERUS    . FINGER SURGERY     Right index finger  . TUBAL LIGATION  01/09/2013   Procedure: POST PARTUM TUBAL LIGATION;  Surgeon: Michael Litter, MD;  Location: [facility];  Service: Gynecology;  Laterality: Bilateral;  post partum tubal ligation bilateral  . US ECHOCARDIOGRAPHY  06/18/2009   ef 55-60%  . WISDOM TOOTH EXTRACTION       Current Meds  Medication Sig  . ferrous sulfate 325 (65 FE) MG tablet Take 1 tablet (325 mg total) by mouth 3 (three) times daily with meals.  . topiramate  (TOPAMAX) 25 MG tablet Take 25 mg by mouth daily.     Allergies:   Latex   Social History   Tobacco Use  . Smoking status: Passive Smoke Exposure - Never Smoker  . Smokeless tobacco: Never Used  Substance Use Topics  . Alcohol use: Yes    Comment: socially  . Drug use: No     Family Hx: The patient's family history includes Alcohol abuse in her maternal uncle; Anxiety disorder in her mother; Asthma in her daughter and mother; Coronary artery disease in her mother; Depression in her mother; Diabetes in her mother; Drug abuse in her maternal uncle; Eating disorder in her mother; Heart disease in her mother; Hyperlipidemia in her mother; Hypertension in her father, maternal grandfather, maternal grandmother, and mother; Obesity in her mother; Sleep apnea in her mother.  ROS:   Please see the history of present illness.    Review of Systems  Constitutional: Negative.   Respiratory: Negative.   Cardiovascular: Negative.   Gastrointestinal: Negative.   Neurological: Positive for headaches.  Psychiatric/Behavioral: Negative.     All other systems reviewed and are negative.   Labs/Other Tests and Data Reviewed:    Recent Labs: 09/08/2018: ALT 30; TSH 0.755 12/17/2018: BUN 15; Creatinine, Ser 0.83; Hemoglobin 9.7; Platelets 363; Potassium 3.9; Sodium 137   Recent Lipid Panel Lab Results  Component Value Date/Time   CHOL 213 (H) 12/17/2018 03:22 PM   TRIG 87 12/17/2018 03:22 PM   HDL 71 12/17/2018 03:22 PM   CHOLHDL 3.0 12/17/2018 03:22 PM   LDLCALC 125 (H) 12/17/2018 03:22 PM    Wt Readings from Last 3 Encounters:  05/03/19 195 lb (88.5 kg)  02/01/19 185 lb (83.9 kg)  01/28/19 191 lb 6.4 oz (86.8 kg)     Exam:    Vital Signs:  BP 136/88 (BP Location: Left Arm, Patient Position: Sitting, Cuff Size: Normal) Comment: pt provided  Pulse 91 Comment: pt provided  Ht  (1.575 m)   Wt 195 lb (88.5 kg) Comment: pt provided  LMP 04/19/2019   BMI 35.67 kg/m     Physical  Exam  Constitutional: She is oriented to person, place, and time and well-developed, well-nourished, and in no distress.  HENT:  Head: Normocephalic and atraumatic.  Neck: Normal range of motion.  Pulmonary/Chest: Effort normal.  Neurological: She is alert and oriented to person, place, and time.  Psychiatric: Affect normal.  Nursing note and vitals reviewed.   ASSESSMENT & PLAN:     1. Migraine without status migrainosus, not intractable, unspecified migraine type  She will continue with topamax. States she does not need refill b/c Weight Mgmt team has refilled this for her.   2. Iron  deficiency anemia, unspecified iron deficiency anemia type  She agrees to come in tomorrow for [employer]. I will make further recommendations once her labs are available for review.   - CBC with Diff - Iron and IBC ([id number]) - Ferritin  3. Menorrhagia with regular cycle  She is encouraged to f/u with GYN. Unfortunately, her previously scheduled appt was rescheduled due to COVID-19 pandemic. Marland Kitchen.   4. Class 2 obesity due to excess calories without serious comorbidity with body mass index (BMI) of 35.0 to 35.9 in adult  She is encouraged to exercise at least 150 minutes per week and avoid processed foods.         COVID-19 Education: The signs and symptoms of COVID-19 were discussed with the patient and how to seek care for testing (follow up with PCP or arrange E-visit).  The importance of social distancing was discussed today.  Patient Risk:   After full review of this patients clinical status, I feel that they are at least moderate risk at this time.  Time:   Today, I have spent 9 minutes with the patient with telehealth technology discussing above diagnoses.     Medication Adjustments/Labs and Tests Ordered: Current medicines are reviewed at length with the patient today.  Concerns regarding medicines are outlined above.   Tests Ordered: Orders Placed This Encounter  Procedures   . CBC with Diff  . Iron and IBC ([id number])  . Ferritin    Medication Changes: No orders of the defined types were placed in this encounter.   Disposition:  Follow up prn  Signed, Gwynneth Alimentobyn N Seham Garden[place], MD

## 2019-05-03 NOTE — Patient Instructions (Signed)
Iron Deficiency Anemia, Adult  Iron-deficiency anemia is when you have a low amount of red blood cells or hemoglobin. This happens because you have too little iron in your body. Hemoglobin carries oxygen to parts of the body. Anemia can cause your body to not get enough oxygen. It may or may not cause symptoms.  Follow these instructions at home:  Medicines  · Take over-the-counter and prescription medicines only as told by your doctor. This includes iron pills (supplements) and vitamins.  · If you cannot handle taking iron pills by mouth, ask your doctor about getting iron through:  ? A vein (intravenously).  ? A shot (injection) into a muscle.  · Take iron pills when your stomach is empty. If you cannot handle this, take them with food.  · Do not drink milk or take antacids at the same time as your iron pills.  · To prevent trouble pooping (constipation), eat fiber or take medicine (stool softener) as told by your doctor.  Eating and drinking    · Talk with your doctor before changing the foods you eat. He or she may tell you to eat foods that have a lot of iron, such as:  ? Liver.  ? Lowfat (lean) beef.  ? Breads and cereals that have iron added to them (fortified breads and cereals).  ? Eggs.  ? Dried fruit.  ? Dark green, leafy vegetables.  · Drink enough fluid to keep your pee (urine) clear or pale yellow.  · Eat fresh fruits and vegetables that are high in vitamin C. They help your body to use iron. Foods with a lot of vitamin C include:  ? Oranges.  ? Peppers.  ? Tomatoes.  ? Mangoes.  General instructions  · Return to your normal activities as told by your doctor. Ask your doctor what activities are safe for you.  · Keep yourself clean, and keep things clean around you (your surroundings). Anemia can make you get sick more easily.  · Keep all follow-up visits as told by your doctor. This is important.  Contact a doctor if:  · You feel sick to your stomach (nauseous).  · You throw up (vomit).  · You feel  weak.  · You are sweating for no clear reason.  · You have trouble pooping, such as:  ? Pooping (having a bowel movement) less than 3 times a week.  ? Straining to poop.  ? Having poop that is hard, dry, or larger than normal.  ? Feeling full or bloated.  ? Pain in the lower belly.  ? Not feeling better after pooping.  Get help right away if:  · You pass out (faint). If this happens, do not drive yourself to the hospital. Call your local emergency services (911 in the [place]  · You have chest pain.  · You have shortness of breath that:  ? Is very bad.  ? Gets worse with physical activity.  · You have a fast heartbeat.  · You get light-headed when getting up from sitting or lying down.  This information is not intended to replace advice given to you by your health care provider. Make sure you discuss any questions you have with your health care provider.  Document Released: 12/27/2010 Document Revised: 08/13/2016 Document Reviewed: 08/13/2016  Elsevier Interactive Patient Education © 2019 [employer]

## 2019-05-04 ENCOUNTER — Ambulatory Visit (INDEPENDENT_AMBULATORY_CARE_PROVIDER_SITE_OTHER): Payer: 59 | Admitting: Family Medicine

## 2019-05-04 ENCOUNTER — Encounter (INDEPENDENT_AMBULATORY_CARE_PROVIDER_SITE_OTHER): Payer: Self-pay | Admitting: Family Medicine

## 2019-05-04 ENCOUNTER — Other Ambulatory Visit: Payer: Self-pay

## 2019-05-04 ENCOUNTER — Ambulatory Visit (HOSPITAL_COMMUNITY): Payer: 59

## 2019-05-04 ENCOUNTER — Other Ambulatory Visit: Payer: 59

## 2019-05-04 ENCOUNTER — Other Ambulatory Visit (HOSPITAL_COMMUNITY): Payer: 59 | Admitting: Psychiatry

## 2019-05-04 ENCOUNTER — Encounter (HOSPITAL_COMMUNITY): Payer: Self-pay

## 2019-05-04 DIAGNOSIS — G4489 Other headache syndrome: Secondary | ICD-10-CM

## 2019-05-04 DIAGNOSIS — Z6833 Body mass index (BMI) 33.0-33.9, adult: Secondary | ICD-10-CM | POA: Diagnosis not present

## 2019-05-04 DIAGNOSIS — F329 Major depressive disorder, single episode, unspecified: Secondary | ICD-10-CM | POA: Diagnosis not present

## 2019-05-04 DIAGNOSIS — F411 Generalized anxiety disorder: Secondary | ICD-10-CM

## 2019-05-04 DIAGNOSIS — F330 Major depressive disorder, recurrent, mild: Secondary | ICD-10-CM

## 2019-05-04 DIAGNOSIS — E669 Obesity, unspecified: Secondary | ICD-10-CM

## 2019-05-04 NOTE — Progress Notes (Signed)
Virtual Visit via Video Note  I connected with Patricia Lin on 04/22/2019 at  9:00 AM EDT by a video enabled telemedicine application and verified that I am speaking with the correct person using two identifiers.   I discussed the limitations of evaluation and management by telemedicine and the availability of in person appointments. The patient expressed understanding and agreed to proceed.   I discussed the assessment and treatment plan with the patient. The patient was provided an opportunity to ask questions and all were answered. The patient agreed with the plan and demonstrated an understanding of the instructions.   The patient was advised to call back or seek an in-person evaluation if the symptoms worsen or if the condition fails to improve as anticipated.  I provided 180 minutes of non-face-to-face time during this encounter.   [name], LCSW     Daily Group Progress Note  Program: IOP  Group Time: 9am-12pm  Participation Level: Active  Behavioral Response: Appropriate  Type of Therapy:  Group Therapy  Summary of Progress:  The purpose of this group is to utilize CBT skills in a group setting to increase use of healthy coping skills and decrease frequency and intensity of active mental health sytmpoms.  9am-10:30am Clinician checked in with clients, assessing for SI/HI/psychosis and overal level of functioning. Clinician and group members processed core values and vulnerability. Clinician and group members practiced small moments of vulnerability and utilizing distress tolerance skills by engaging in loaded questions. 10:30am-12pm Clinician and group members reviewed skills for self compassion and practiced Opposite Action as a skill for building self compassion. Clinician and group members identified common automatic responses such as avoiding, giving up, procrastinating, and identified the opposite including staying mindfully in the comment, using assertive  communication, and problem solving. Client engaged in group discussions, identifying struggle with self compassion. Client reports often avoiding situations or running away and is open to staying in the moment longer than comfortable if things are only distressing, and not dangerous. Client was able to demonstrate assertive communication skills in session.  [name], LCSW

## 2019-05-04 NOTE — Progress Notes (Signed)
Virtual Visit via Telephone Note  I connected with Shayla Gipe on 05/04/19 at  9:00 AM EDT by telephone and verified that I am speaking with the correct person using two identifiers.  Location: Patient: Patricia Lin Provider: Hilbert Odor, LCSW   I discussed the limitations, risks, security and privacy concerns of performing an evaluation and management service by telephone and the availability of in person appointments. I also discussed with the patient that there may be a patient responsible charge related to this service. The patient expressed understanding and agreed to proceed.   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    Observations/Objective: Counselor checked in with Jonessa and group members to gauge their mood and functioning to start the session. Counselor reviewed the TIPP Skills DBT Worksheet and processed history of panic attacks with group members. Little shared what her Panic Attack symptoms are and was open to using skills to address it. Counselor introduced [place], [place] to process grief and loss issues with the group members. Girtie volunteered to participated first and was able to connect with the emotions triggered by the losses in her life. Counselor thanked Clinical biochemist for his work with the group, then we spent time reflecting on lessons learned from the processing. Counselor prompted the group to have a time to journal and self-reflection on moving forward and accepting losses. Counselor gathered reflective responses from group members. Odetta had profound reflections and an action plan in addressing her issues. Counselor then transitioned to sharing psychoeducation on Self-Compassion, which was something Shawnika reported losing in her life. Counselor summarized today's session and prompted each participant to share one self-care or productive task they would accomplish today.   Assessment and Plan: Safina participated well in group  today. She continues to be invested in treatment and improving her mental health. Counselor recommends that she continues participation in group to address mental health needs and treatment plan goals.   Follow Up Instructions: Counselor will send Webex link for next group meeting.    I discussed the assessment and treatment plan with the patient. The patient was provided an opportunity to ask questions and all were answered. The patient agreed with the plan and demonstrated an understanding of the instructions.   The patient was advised to call back or seek an in-person evaluation if the symptoms worsen or if the condition fails to improve as anticipated.  I provided 180 minutes of non-face-to-face time during this encounter.   Hilbert Odor, LCSW
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
## 2019-05-05 ENCOUNTER — Other Ambulatory Visit (HOSPITAL_COMMUNITY): Payer: 59 | Admitting: Psychiatry

## 2019-05-05 ENCOUNTER — Ambulatory Visit (HOSPITAL_COMMUNITY): Payer: 59 | Admitting: Psychiatry

## 2019-05-05 ENCOUNTER — Ambulatory Visit (HOSPITAL_COMMUNITY): Payer: 59

## 2019-05-05 ENCOUNTER — Telehealth (HOSPITAL_COMMUNITY): Payer: Self-pay | Admitting: Psychiatry

## 2019-05-05 LAB — CBC WITH DIFFERENTIAL/PLATELET
Basophils Absolute: 0 10*3/uL (ref 0.0–0.2)
Basos: 1 %
EOS (ABSOLUTE): 0.1 10*3/uL (ref 0.0–0.4)
Eos: 1 %
Hematocrit: 28.7 % — ABNORMAL LOW (ref 34.0–46.6)
Hemoglobin: 9 g/dL — ABNORMAL LOW (ref 11.1–15.9)
Immature Grans (Abs): 0 10*3/uL (ref 0.0–0.1)
Immature Granulocytes: 0 %
Lymphocytes Absolute: 1.5 10*3/uL (ref 0.7–3.1)
Lymphs: 33 %
MCH: 22.8 pg — ABNORMAL LOW (ref 26.6–33.0)
MCHC: 31.4 g/dL — ABNORMAL LOW (ref 31.5–35.7)
MCV: 73 fL — ABNORMAL LOW (ref 79–97)
Monocytes Absolute: 0.4 10*3/uL (ref 0.1–0.9)
Monocytes: 10 %
Neutrophils Absolute: 2.5 10*3/uL (ref 1.4–7.0)
Neutrophils: 55 %
Platelets: 328 10*3/uL (ref 150–450)
RBC: 3.94 x10E6/uL (ref 3.77–5.28)
RDW: 16.3 % — ABNORMAL HIGH (ref 11.7–15.4)
WBC: 4.4 10*3/uL (ref 3.4–10.8)

## 2019-05-05 LAB — IRON AND TIBC
Iron Saturation: 12 % — ABNORMAL LOW (ref 15–55)
Iron: 44 ug/dL (ref 27–159)
Total Iron Binding Capacity: 375 ug/dL (ref 250–450)
UIBC: 331 ug/dL (ref 131–425)

## 2019-05-05 LAB — FERRITIN: Ferritin: 5 ng/mL — ABNORMAL LOW (ref 15–150)

## 2019-05-05 NOTE — Progress Notes (Signed)
Office: [phone]  /  Fax: [phone] TeleHealth Visit:  Patricia Lin has verbally consented to this TeleHealth visit today. The patient is located at home, the provider is located at the [facility] office. The participants in this visit include the listed provider and patient and any and all parties involved. The visit was conducted today via FaceTime.  HPI:   Chief Complaint: OBESITY Patricia Lin is here to discuss her progress with her obesity treatment plan. She is on the keep a food journal with 1400 to 1600 calories and 85+ grams of protein daily plan and is following her eating plan approximately 25 % of the time. She states she is walking 30 minutes 2 times per week. [name] tried to change to journaling and she did well for a few days, but then she got distracted. Marlee feels she has gained a couple of pounds, but she is ready to get back on track. We were unable to weigh the patient today for this TeleHealth visit. She feels as if she has gained weight since her last visit. She has gained 4 lbs since starting treatment with [place].  Headache [name] restarted her Topiramate to help with headaches. She notes an increase in headaches with weather changes, but she feels the Topiramate is helping. She denies nausea or vomiting.  ASSESSMENT AND PLAN:  Other headache syndrome  Class 1 obesity with serious comorbidity and body mass index (BMI) of 33.0 to 33.9 in adult, unspecified obesity type  PLAN:  Headache [name] is to continue Topiramate at the current dose, as higher doses make her feel "funny". [name] will follow up with our clinic in 2 weeks.  I spent > than 50% of the 15 minute visit on counseling as documented in the note.  Obesity [name] is currently in the action stage of change. As such, her goal is to continue with weight loss efforts She has agreed to keep a food journal with 1400 to 1600 calories and 85+ grams of protein daily [name] has been instructed  to work up to a goal of 150 minutes of combined cardio and strengthening exercise per week for weight loss and overall health benefits. We discussed the following Behavioral Modification Strategies today: increasing lean protein intake, decreasing simple carbohydrates, increasing vegetables and work on meal planning and easy cooking plans  [name] has agreed to follow up with our clinic in 2 weeks. She was informed of the importance of frequent follow up visits to maximize her success with intensive lifestyle modifications for her multiple health conditions.  ALLERGIES: Allergies  Allergen Reactions  . Latex Itching and Rash    MEDICATIONS: Current Outpatient Medications on File Prior to Visit  Medication Sig Dispense Refill  . ferrous sulfate 325 (65 FE) MG tablet Take 1 tablet (325 mg total) by mouth 3 (three) times daily with meals. 90 tablet 0  . topiramate (TOPAMAX) 25 MG tablet Take 25 mg by mouth daily.     No current facility-administered medications on file prior to visit.     PAST MEDICAL HISTORY: Past Medical History:  Diagnosis Date  . Abnormal Pap smear   . Anemia   . Anxiety   . Back pain   . BV (bacterial vaginosis) 2011  . Chlamydia infection   . CIN I (cervical intraepithelial neoplasia I) 2008  . Depression   . Dyspnea   . Dysrhythmia 2009   TACHYCARDIA;   HAD W/U 2010? POSSIBLE HEART VALVE ISSUE; NOT F/U NEEDED X 3 YEARS PER  PT  . Dysuria 2010  . Fatigue   . H/O pre-eclampsia in prior pregnancy, currently pregnant   . H/O varicella   . H/O vitamin D deficiency   . Headache(784.0)    MIGRAINES  . HTN (hypertension)   . Hx of breast reduction, elective   . Hx: UTI (urinary tract infection)   . Infection    UTI DURING PREGNANCY  . Infection    YEAST WITH PREGNANCY  . Infection    CHLAMYDIA X 1  . Knee pain   . 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 Low iron   . PIH (pregnancy induced  hypertension) 11-2012   Today pp one month.  B/P nl, and 122/76 on repeat-sitting  . Postpartum depression 2011   NO MEDS  . Pregnancy induced hypertension    ALL PREGNANCIES  . Shortness of breath    WITH EXERTION SICNE PREGNANCY  . Tachycardia   . Tachycardia 2010   HAS HAD WORKUP. UNSURE IF HAS POSSIBLE HEART VALVE ISSUE  . Vulvitis 2009    PAST SURGICAL HISTORY: Past Surgical History:  Procedure Laterality Date  . BREAST REDUCTION SURGERY  2008  . BREAST SURGERY    . DILATION AND CURETTAGE OF UTERUS    . FINGER SURGERY     Right index finger  . TUBAL LIGATION  01/09/2013   Procedure: POST PARTUM TUBAL LIGATION;  Surgeon: Michael Litter, MD;  Location: [facility];  Service: Gynecology;  Laterality: Bilateral;  post partum tubal ligation bilateral  . US ECHOCARDIOGRAPHY  06/18/2009   ef 55-60%  . WISDOM TOOTH EXTRACTION      SOCIAL HISTORY: Social History   Tobacco Use  . Smoking status: Passive Smoke Exposure - Never Smoker  . Smokeless tobacco: Never Used  Substance Use Topics  . Alcohol use: Yes    Comment: socially  . Drug use: No    FAMILY HISTORY: Family History  Problem Relation Age of Onset  . Hypertension Mother   . Hyperlipidemia Mother   . Coronary artery disease Mother   . Heart disease Mother        high cholesterol  . Asthma Mother   . Diabetes Mother   . Depression Mother   . Anxiety disorder Mother   . Sleep apnea Mother   . Obesity Mother   . Eating disorder Mother   . Hypertension Father   . Hypertension Maternal Grandmother   . Hypertension Maternal Grandfather   . Asthma Daughter   . Alcohol abuse Maternal Uncle   . Drug abuse Maternal Uncle     ROS: Review of Systems  Constitutional: Negative for weight loss.  Gastrointestinal: Negative for nausea and vomiting.  Neurological: Positive for headaches.    PHYSICAL EXAM: Pt in no acute distress  RECENT LABS AND TESTS: BMET    Component Value Date/Time   NA 137 12/17/2018 1522    K 3.9 12/17/2018 1522   CL 99 12/17/2018 1522   CO2 22 12/17/2018 1522   GLUCOSE 85 12/17/2018 1522   GLUCOSE 95 12/05/2017 0747   BUN 15 12/17/2018 1522   CREATININE 0.83 12/17/2018 1522   CREATININE 0.64 01/17/2013 1720   CALCIUM 9.6 12/17/2018 1522   GFRNONAA 90 12/17/2018 1522   GFRAA 103 12/17/2018 1522   Lab Results  Component Value Date   HGBA1C 5.2 09/08/2018   Lab Results  Component Value Date   INSULIN 11.5 09/08/2018   CBC  Component Value Date/Time   WBC 4.4 05/04/2019 1420   WBC 6.0 12/06/2017 0000   RBC 3.94 05/04/2019 1420   RBC 4.52 12/06/2017 0000   HGB 9.0 (L) 05/04/2019 1420   HCT 28.7 (L) 05/04/2019 1420   PLT 328 05/04/2019 1420   MCV 73 (L) 05/04/2019 1420   MCH 22.8 (L) 05/04/2019 1420   MCH 19.7 (L) 12/06/2017 0000   MCHC 31.4 (L) 05/04/2019 1420   MCHC 29.5 (L) 12/06/2017 0000   RDW 16.3 (H) 05/04/2019 1420   LYMPHSABS 1.5 05/04/2019 1420   MONOABS 0.3 12/04/2017 1858   EOSABS 0.1 05/04/2019 1420   BASOSABS 0.0 05/04/2019 1420   Iron/TIBC/Ferritin/ %Sat    Component Value Date/Time   IRON 44 05/04/2019 1420   TIBC 375 05/04/2019 1420   FERRITIN 5 (L) 05/04/2019 1420   IRONPCTSAT 12 (L) 05/04/2019 1420   Lipid Panel     Component Value Date/Time   CHOL 213 (H) 12/17/2018 1522   TRIG 87 12/17/2018 1522   HDL 71 12/17/2018 1522   CHOLHDL 3.0 12/17/2018 1522   LDLCALC 125 (H) 12/17/2018 1522   Hepatic Function Panel     Component Value Date/Time   PROT 7.7 09/08/2018 1042   ALBUMIN 4.5 09/08/2018 1042   AST 25 09/08/2018 1042   ALT 30 09/08/2018 1042   ALKPHOS 78 09/08/2018 1042   BILITOT 0.8 09/08/2018 1042      Component Value Date/Time   TSH 0.755 09/08/2018 1042     Ref. Range 09/08/2018 10:42  Vitamin D, 25-Hydroxy Latest Ref Range: 30.0 - 100.0 ng/mL 9.6 (L)    I, [place] CraneJoanne Murray, am acting as transcriptionist for Quillian Quincearen Khaliel Morey, MD I have reviewed the above documentation for accuracy and completeness, and I  agree with the above. -Quillian Quincearen Genola Yuille, MD

## 2019-05-06 ENCOUNTER — Other Ambulatory Visit (HOSPITAL_COMMUNITY): Payer: 59 | Admitting: Licensed Clinical Social Worker

## 2019-05-06 ENCOUNTER — Ambulatory Visit (HOSPITAL_COMMUNITY): Payer: 59

## 2019-05-06 ENCOUNTER — Other Ambulatory Visit: Payer: Self-pay

## 2019-05-06 DIAGNOSIS — F330 Major depressive disorder, recurrent, mild: Secondary | ICD-10-CM

## 2019-05-06 DIAGNOSIS — F329 Major depressive disorder, single episode, unspecified: Secondary | ICD-10-CM | POA: Diagnosis not present

## 2019-05-06 DIAGNOSIS — F411 Generalized anxiety disorder: Secondary | ICD-10-CM

## 2019-05-09 ENCOUNTER — Other Ambulatory Visit: Payer: Self-pay

## 2019-05-09 ENCOUNTER — Telehealth (HOSPITAL_COMMUNITY): Payer: Self-pay | Admitting: Psychiatry

## 2019-05-09 ENCOUNTER — Other Ambulatory Visit (HOSPITAL_COMMUNITY): Payer: 59 | Admitting: Psychiatry

## 2019-05-09 NOTE — Progress Notes (Signed)
Virtual Visit via Video Note  I connected with Patricia Lin on 05/06/2019 at  9:00 AM EDT by a video enabled telemedicine application and verified that I am speaking with the correct person using two identifiers.   I discussed the limitations of evaluation and management by telemedicine and the availability of in person appointments. The patient expressed understanding and agreed to proceed.  I discussed the assessment and treatment plan with the patient. The patient was provided an opportunity to ask questions and all were answered. The patient agreed with the plan and demonstrated an understanding of the instructions.   The patient was advised to call back or seek an in-person evaluation if the symptoms worsen or if the condition fails to improve as anticipated.  I provided 180 minutes of non-face-to-face time during this encounter.   Patricia Ditty, LCSW     Daily Group Progress Note  Program: IOP  Group Time: 9am-12pm  Participation Level: Active  Behavioral Response: Appropriate  Type of Therapy:  Group Therapy; process group, psycho-educational group  Summary of Progress:  The purpose of this group is to utilize CBT in a group setting to increase use of healthy coping skills and decrease frequency and intensity of active mental health symptoms.  Clinician and group members practiced emotional regulation and vulnerability by utilizing Loaded Questions. Topics processed included reasons behind anger, fear, and ultimately feelings of lack of control and powerlessness. Clinician and group members processed how seeking events involvement chaos or self-sabotage, while unhealthy, are often perceived as normal based on family dynamics growing up. Clinician presented the skill of Tapping, focused on managing self-talk and self-acceptance, including perceived flaws. Clinician and group members reviewed DBT skill IMPROVE and discussed ways some of the skills could be incorporated in  daily life. Client discussed ongoing distress with her husband but not being willing to make immediate, drastic life changes to family dynamics. Client verbalizes frustration with children mirroring husband's behaviors and grandmother's behaviors. Client reports teaching skills to children to help manage children's frustration as well as her own.  Patricia Ditty, LCSW

## 2019-05-10 ENCOUNTER — Other Ambulatory Visit (HOSPITAL_COMMUNITY): Payer: 59 | Attending: Psychiatry | Admitting: Licensed Clinical Social Worker

## 2019-05-10 ENCOUNTER — Other Ambulatory Visit: Payer: Self-pay

## 2019-05-10 DIAGNOSIS — F411 Generalized anxiety disorder: Secondary | ICD-10-CM

## 2019-05-10 DIAGNOSIS — F330 Major depressive disorder, recurrent, mild: Secondary | ICD-10-CM

## 2019-05-10 DIAGNOSIS — F329 Major depressive disorder, single episode, unspecified: Secondary | ICD-10-CM | POA: Insufficient documentation

## 2019-05-10 NOTE — Progress Notes (Signed)
Virtual Visit via Video Note  I connected with Shantrell Bibb on 05/10/19 at  9:00 AM EDT by a video enabled telemedicine application and verified that I am speaking with the correct person using two identifiers.   I discussed the limitations of evaluation and management by telemedicine and the availability of in person appointments. The patient expressed understanding and agreed to proceed.  I discussed the assessment and treatment plan with the patient. The patient was provided an opportunity to ask questions and all were answered. The patient agreed with the plan and demonstrated an understanding of the instructions.   The patient was advised to call back or seek an in-person evaluation if the symptoms worsen or if the condition fails to improve as anticipated.  I provided 180 minutes of non-face-to-face time during this encounter.   [name], LCSW     Daily Group Progress Note  Program: IOP  Group Time: 9am-12pm  Participation Level: Active  Behavioral Response: Appropriate  Type of Therapy:  Group Therapy; process group, psycho-educational group  Summary of Progress:  The purpose of the group is to utilize CBT and DBT skills to increase use of healthy coping skills and decrease frequency and intensity of active mental health symptoms. 9am-10:30am Clinician checked in with clients, assessing for SI/HI/psychosis and overall level of functioning. Clinician inquired about any recently attempted coping skills and their effectiveness.  Clinician utilized Loaded Questions to process with clients bad habits known but not changed, and addressing insecurities and criticism. Clinician praised clients for utilizing distress tolerance skills, including opposite action to remain engaged in conversation despite feelings if discomfort. Clinician led clients in Guided visualization as new skill to help manage anxiety and racing thoughts. 10:30am-12pm Clinician presented the topic of Radical  Acceptance. Clinician reviewed CBT triangle and the connection of thoughts, feelings, and behaviors. Clinician and group members discussed what is and is not radical acceptance, what things need to be accepted and why as well as factors that interfere with acceptance. Clinician and group discussed what is something they would like to radically accept and the current barriers. Clinician requested clients identify a positive self-trait and discussed difficulty with positive self-talk as well as the importance of strengthening this skill. Client reports what she is working on accepting is that she cannot change or 'fix' her husband's drinking.   [name], LCSW

## 2019-05-11 ENCOUNTER — Other Ambulatory Visit (HOSPITAL_COMMUNITY): Payer: 59 | Admitting: Psychiatry

## 2019-05-11 ENCOUNTER — Other Ambulatory Visit: Payer: Self-pay

## 2019-05-11 DIAGNOSIS — F330 Major depressive disorder, recurrent, mild: Secondary | ICD-10-CM

## 2019-05-11 DIAGNOSIS — F411 Generalized anxiety disorder: Secondary | ICD-10-CM

## 2019-05-11 DIAGNOSIS — F329 Major depressive disorder, single episode, unspecified: Secondary | ICD-10-CM | POA: Diagnosis not present

## 2019-05-12 ENCOUNTER — Other Ambulatory Visit: Payer: Self-pay

## 2019-05-12 ENCOUNTER — Other Ambulatory Visit (HOSPITAL_COMMUNITY): Payer: 59 | Admitting: Family

## 2019-05-12 ENCOUNTER — Encounter (HOSPITAL_COMMUNITY): Payer: Self-pay

## 2019-05-12 ENCOUNTER — Encounter (HOSPITAL_COMMUNITY): Payer: Self-pay | Admitting: Psychiatry

## 2019-05-12 DIAGNOSIS — F330 Major depressive disorder, recurrent, mild: Secondary | ICD-10-CM

## 2019-05-12 DIAGNOSIS — F329 Major depressive disorder, single episode, unspecified: Secondary | ICD-10-CM | POA: Diagnosis not present

## 2019-05-12 DIAGNOSIS — F411 Generalized anxiety disorder: Secondary | ICD-10-CM

## 2019-05-12 NOTE — Progress Notes (Signed)
Virtual Visit via Video Note  I connected with Patricia Lin on 05/12/19 at  9:00 AM EDT by a video enabled telemedicine application and verified that I am speaking with the correct person using two identifiers.  Location: Patient: Patricia Lin Provider: Hilbert Odor, LCSW   I discussed the limitations of evaluation and management by telemedicine and the availability of in person appointments. The patient expressed understanding and agreed to proceed.  History of Present Illness: MDD and GAD due to adverse life experiences, marital issues and work related stressors.    Objective/Observations: Patricia Lin participated well and was engaged in giving feedback and in the therapy activities in group today. Counselor checked in with all group members to assess current mood and functioning. Patricia Lin shared that she was feeling ok and was about to rest some the afternoon before. Counselor engaged the group in psychoeducation and skill development on grounding techniques. Patricia Lin shared when called on and offered ways she intended to utilize these skills in everyday situations. She particularly liked the breathing techniques, the soothing exercises and connecting with nature. Counselor shared a video on Setting Boundaries and processed the concepts with group members. Patricia Lin shared about work situations and family related situations where she could apply the skills. Counselor closed with checking in with all about their self-care plan and productivity task for the day.   Assessment and Plan: Counselor recommends that Patricia Lin remains in IOP treatment through Thursday to continue work on treatment plan goals and better management of symptoms. After Thursday she will be connected with a therapist to continue work on goals. She should continue taking medications as prescribed, follow safety and crisis plan and implement skills used in session.   Follow Up Instructions: Counselor will send out Webex Link to connect to  IOP for tomorrow.    I discussed the assessment and treatment plan with the patient. The patient was provided an opportunity to ask questions and all were answered. The patient agreed with the plan and demonstrated an understanding of the instructions.   The patient was advised to call back or seek an in-person evaluation if the symptoms worsen or if the condition fails to improve as anticipated.  I provided 180 minutes of non-face-to-face time during this encounter.   Hilbert Odor, LCSW
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
## 2019-05-12 NOTE — Progress Notes (Signed)
Virtual Visit via Video Note  I connected with Patricia Lin on 05/12/19 at  9:00 AM EDT by a video enabled telemedicine application and verified that I am speaking with the correct person using two identifiers.  Location: Patient: Patricia Lin Provider: Hilbert Odor, LCSW   I discussed the limitations of evaluation and management by telemedicine and the availability of in person appointments. The patient expressed understanding and agreed to proceed.  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    Observations/Objective: Counselor checked in with all group members to determine current state and mood. Patricia Lin shared that she was feeling happy and sad today, because it was her last day of group. Counselor and group celebrated her for graduating today from the program. Patricia Lin shared insights of what she has gained from the group, her action plan for addressing her mental health and life stressors and thanked the group members for listening. Counselor introduced the Doctor, hospital, Theda [place], [facility] who covered grief and loss issues. Patricia Lin shared that she is grieving for the [facility], for having Black children who are exposed to hate and about the grief she's felt in relationship choices in life. Counselor allowed group members to journal/self-reflect on the presentation and processed out loud their major takeaways. Counselor introduced a second Doctor, hospital who walked the group through two yoga practices. Patricia Lin participated fully and shared that she benefited from the experience, finding it very grounding and relaxing. Counselor summarized session and group members shared their intentions for the afternoon. Counselor higlighted once again all Patricia Lin's personal work and growth while in group.   Assessment and Plan: Counselor recommends that Patricia Lin begin individual therapy to continue  addressing mental health symptoms and treatment plan goals. It is recommended that Patricia Lin take medications as prescribed, apply skills learned in session, follow safety and crisis plan and communicate needs with support system.   Follow Up Instructions: Counselor will send link for tomorrow's group meeting via Webex.     I discussed the assessment and treatment plan with the patient. The patient was provided an opportunity to ask questions and all were answered. The patient agreed with the plan and demonstrated an understanding of the instructions.   The patient was advised to call back or seek an in-person evaluation if the symptoms worsen or if the condition fails to improve as anticipated.  I provided 180 minutes of non-face-to-face time during this encounter.   Hilbert Odor, LCSW

## 2019-05-12 NOTE — Patient Instructions (Addendum)
D:  Patient completed MH-IOP today.  A: Discharged today.  Follow up with Hilbert Odor, LCSW on 05-13-19 @ 12 noon and patient to call [facility] or [facility] for appointment.  RTW on 05-16-19 without any restrictions.  R:  Patient receptive.

## 2019-05-13 ENCOUNTER — Ambulatory Visit (INDEPENDENT_AMBULATORY_CARE_PROVIDER_SITE_OTHER): Payer: 59 | Admitting: Psychiatry

## 2019-05-13 ENCOUNTER — Other Ambulatory Visit: Payer: Self-pay

## 2019-05-13 ENCOUNTER — Encounter (HOSPITAL_COMMUNITY): Payer: Self-pay | Admitting: Psychiatry

## 2019-05-13 DIAGNOSIS — F330 Major depressive disorder, recurrent, mild: Secondary | ICD-10-CM | POA: Diagnosis not present

## 2019-05-13 DIAGNOSIS — F411 Generalized anxiety disorder: Secondary | ICD-10-CM

## 2019-05-13 NOTE — Progress Notes (Signed)
Virtual Visit via Video Note  I connected with [name] on 05/13/19 at 12:00 PM EDT by a video enabled telemedicine application and verified that I am speaking with the correct person using two identifiers.  Location: Patient: Patricia Lin  Provider: Lise Auer, LCSW   I discussed the limitations of evaluation and management by telemedicine and the availability of in person appointments. The patient expressed understanding and agreed to proceed.  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    Observations/Objective: Counselor met with Ikia for individual therapy via Webex. Counselor assessed MH symptoms and progress on treatment plan goals. Yashvi denied suicidal ideation or self-harm behaviors. Nastasia shared that she was very anxious to the point of dread in going back to work on Monday. Counselor and Sunset processed her transition back to work, ways she can apply skills learned in IOP, how to advocate for herself and an exist strategy if the work environment does not improve. Counselor and Jull discussed her progress made in IOP treatment. Elsye reports that she was more vulnerable in opening up about her current problems and in reassessing her life and what direction she wants to go. Counselor and Connor ended earlier than planned because she and her family were going on a hike today. Counselor encouraged Ingrid to continue applying the skills and insert small activities of self-care within her work day to reduce anxiety and stress.    Assessment and Plan: Counselor will continue to meet with Vernesha to address treatment plan goals. Wilba will continue to follow recommendations of providers and implement skills learned in session.  Follow Up Instructions: Counselor will send information for next session via Webex.     I discussed the assessment and treatment plan with the patient. The  patient was provided an opportunity to ask questions and all were answered. The patient agreed with the plan and demonstrated an understanding of the instructions.   The patient was advised to call back or seek an in-person evaluation if the symptoms worsen or if the condition fails to improve as anticipated.  I provided 30 minutes of non-face-to-face time during this encounter.   Lise Auer, LCSW
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
## 2019-05-16 ENCOUNTER — Other Ambulatory Visit: Payer: Self-pay | Admitting: Nurse Practitioner

## 2019-05-16 ENCOUNTER — Encounter: Payer: Self-pay | Admitting: Nurse Practitioner

## 2019-05-16 ENCOUNTER — Encounter (INDEPENDENT_AMBULATORY_CARE_PROVIDER_SITE_OTHER): Payer: Self-pay | Admitting: Family Medicine

## 2019-05-16 DIAGNOSIS — D509 Iron deficiency anemia, unspecified: Secondary | ICD-10-CM

## 2019-05-16 MED ORDER — FERROUS SULFATE 325 (65 FE) MG PO TABS: 325.0000 mg | ORAL_TABLET | Freq: Three times a day (TID) | ORAL | 3 refills | Status: DC

## 2019-05-18 ENCOUNTER — Ambulatory Visit (INDEPENDENT_AMBULATORY_CARE_PROVIDER_SITE_OTHER): Payer: 59 | Admitting: Family Medicine

## 2019-05-18 ENCOUNTER — Encounter (INDEPENDENT_AMBULATORY_CARE_PROVIDER_SITE_OTHER): Payer: Self-pay | Admitting: Family Medicine

## 2019-05-18 ENCOUNTER — Other Ambulatory Visit: Payer: Self-pay

## 2019-05-18 DIAGNOSIS — E669 Obesity, unspecified: Secondary | ICD-10-CM | POA: Diagnosis not present

## 2019-05-18 DIAGNOSIS — Z6833 Body mass index (BMI) 33.0-33.9, adult: Secondary | ICD-10-CM | POA: Diagnosis not present

## 2019-05-18 DIAGNOSIS — E559 Vitamin D deficiency, unspecified: Secondary | ICD-10-CM | POA: Diagnosis not present

## 2019-05-18 MED ORDER — VITAMIN D (ERGOCALCIFEROL) 1.25 MG (50000 UNIT) PO CAPS: 50000.0000 [IU] | ORAL_CAPSULE | ORAL | 0 refills | Status: DC

## 2019-05-23 NOTE — Progress Notes (Signed)
Office: [phone]  /  Fax: [phone] TeleHealth Visit:  Cerena Baine has verbally consented to this TeleHealth visit today. The patient is located in a vehicle, the provider is located at the [employer] [facility] office. The participants in this visit include the listed provider, patient, and her spouse Enid Derry. The visit was conducted today via Face Time.  HPI:   Chief Complaint: OBESITY Katelynne is here to discuss her progress with her obesity treatment plan. She is on the  keep a food journal with 1400 to 1600 calories and 85+ grams of protein and is following her eating plan approximately 0 % of the time. She states she climbed at [employer] once in the 2 weeks. Dalena has completely gotten off track and has not been trying to lose weight. She is sure that she has gained weight, but is not sure how much. Nalina doesn't want to do a structured plan, but is considering doing some type of "smoothie fast".  We were unable to weigh the patient today for this TeleHealth visit. She feels as if she has gained weight since her last visit. She has lost 6 lbs since starting treatment with [place].  Vitamin D Deficiency Vinisha has a diagnosis of vitamin D deficiency. She is currently stable on vit D, but is not yet at goal. Chihiro denies nausea, vomiting, or muscle weakness.  ASSESSMENT AND PLAN:  Vitamin D deficiency  Class 1 obesity with serious comorbidity and body mass index (BMI) of 33.0 to 33.9 in adult, unspecified obesity type  PLAN:  Vitamin D Deficiency Ainsleigh was informed that low vitamin D levels contribute to fatigue and are associated with obesity, breast, and colon cancer. Apryll agrees to continue to take prescription Vit D @50 ,000 IU every week #4 with no refills and will follow up for routine testing of vitamin D, at least 2-3 times per year. She was informed of the risk of over-replacement of vitamin D and agrees to not increase her dose unless she discusses this with [place]  first. Melynda agrees to follow up in 2 weeks as directed.  Obesity Richele is currently in the action stage of change. As such, her goal is to maintain her weight for now. She has agreed to portion control better and make smarter food choices, such as increase vegetables and decrease simple carbohydrates.  [employer] has been instructed to work up to a goal of 150 minutes of combined cardio and strengthening exercise per week for weight loss and overall health benefits. We discussed the following Behavioral Modification Strategies today: increasing lean protein intake, decreasing simple carbohydrates, increasing vegetables, and decrease eating out. Doriann was encouraged to follow a healthy eating plan and avoid fad diets which will likely only drop her resting metabolic rate. We will follow her to monitor her progress.  Kaula has agreed to follow up with our clinic in 2 weeks. She was informed of the importance of frequent follow up visits to maximize her success with intensive lifestyle modifications for her multiple health conditions.  ALLERGIES: Allergies  Allergen Reactions  . Latex Itching and Rash    MEDICATIONS: Current Outpatient Medications on File Prior to Visit  Medication Sig Dispense Refill  . ferrous sulfate 325 (65 FE) MG tablet Take 1 tablet (325 mg total) by mouth 3 (three) times daily with meals. 90 tablet 3  . topiramate (TOPAMAX) 25 MG tablet Take 25 mg by mouth daily.     No current facility-administered medications on file prior to visit.  PAST MEDICAL HISTORY: Past Medical History:  Diagnosis Date  . Abnormal Pap smear   . Anemia   . Anxiety   . Back pain   . BV (bacterial vaginosis) 2011  . Chlamydia infection   . CIN I (cervical intraepithelial neoplasia I) 2008  . Depression   . Dyspnea   . Dysrhythmia 2009   TACHYCARDIA;   HAD W/U 2010? POSSIBLE HEART VALVE ISSUE; NOT F/U NEEDED X 3 YEARS PER PT  . Dysuria 2010  . Fatigue   . H/O pre-eclampsia in  prior pregnancy, currently pregnant   . H/O varicella   . H/O vitamin D deficiency   . Headache(784.0)    MIGRAINES  . HTN (hypertension)   . Hx of breast reduction, elective   . Hx: UTI (urinary tract infection)   . Infection    UTI DURING PREGNANCY  . Infection    YEAST WITH PREGNANCY  . Infection    CHLAMYDIA X 1  . Knee pain   . 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 Low iron   . PIH (pregnancy induced hypertension) 11-2012   Today pp one month.  B/P nl, and 122/76 on repeat-sitting  . Postpartum depression 2011   NO MEDS  . Pregnancy induced hypertension    ALL PREGNANCIES  . Shortness of breath    WITH EXERTION SICNE PREGNANCY  . Tachycardia   . Tachycardia 2010   HAS HAD WORKUP. UNSURE IF HAS POSSIBLE HEART VALVE ISSUE  . Vulvitis 2009    PAST SURGICAL HISTORY: Past Surgical History:  Procedure Laterality Date  . BREAST REDUCTION SURGERY  2008  . BREAST SURGERY    . DILATION AND CURETTAGE OF UTERUS    . FINGER SURGERY     Right index finger  . TUBAL LIGATION  01/09/2013   Procedure: POST PARTUM TUBAL LIGATION;  Surgeon: Michael LitterNaima A Dillard, MD;  Location: [facility];  Service: Gynecology;  Laterality: Bilateral;  post partum tubal ligation bilateral  . US ECHOCARDIOGRAPHY  06/18/2009   ef 55-60%  . WISDOM TOOTH EXTRACTION      SOCIAL HISTORY: Social History   Tobacco Use  . Smoking status: Passive Smoke Exposure - Never Smoker  . Smokeless tobacco: Never Used  Substance Use Topics  . Alcohol use: Yes    Comment: socially  . Drug use: No    FAMILY HISTORY: Family History  Problem Relation Age of Onset  . Hypertension Mother   . Hyperlipidemia Mother   . Coronary artery disease Mother   . Heart disease Mother        high cholesterol  . Asthma Mother   . Diabetes Mother   . Depression Mother   . Anxiety disorder Mother   . Sleep apnea Mother   . Obesity Mother   . Eating disorder Mother   .  Hypertension Father   . Hypertension Maternal Grandmother   . Hypertension Maternal Grandfather   . Asthma Daughter   . Alcohol abuse Maternal Uncle   . Drug abuse Maternal Uncle     ROS: Review of Systems  Gastrointestinal: Negative for nausea and vomiting.  Musculoskeletal:       Negative for muscle weakness.    PHYSICAL EXAM: Pt in no acute distress  RECENT LABS AND TESTS: BMET    Component Value Date/Time   NA 137 12/17/2018 1522   K 3.9 12/17/2018 1522   CL 99 12/17/2018 1522   CO2 22  12/17/2018 1522   GLUCOSE 85 12/17/2018 1522   GLUCOSE 95 12/05/2017 0747   BUN 15 12/17/2018 1522   CREATININE 0.83 12/17/2018 1522   CREATININE 0.64 01/17/2013 1720   CALCIUM 9.6 12/17/2018 1522   GFRNONAA 90 12/17/2018 1522   GFRAA 103 12/17/2018 1522   Lab Results  Component Value Date   HGBA1C 5.2 09/08/2018   Lab Results  Component Value Date   INSULIN 11.5 09/08/2018   CBC    Component Value Date/Time   WBC 4.4 05/04/2019 1420   WBC 6.0 12/06/2017 0000   RBC 3.94 05/04/2019 1420   RBC 4.52 12/06/2017 0000   HGB 9.0 (L) 05/04/2019 1420   HCT 28.7 (L) 05/04/2019 1420   PLT 328 05/04/2019 1420   MCV 73 (L) 05/04/2019 1420   MCH 22.8 (L) 05/04/2019 1420   MCH 19.7 (L) 12/06/2017 0000   MCHC 31.4 (L) 05/04/2019 1420   MCHC 29.5 (L) 12/06/2017 0000   RDW 16.3 (H) 05/04/2019 1420   LYMPHSABS 1.5 05/04/2019 1420   MONOABS 0.3 12/04/2017 1858   EOSABS 0.1 05/04/2019 1420   BASOSABS 0.0 05/04/2019 1420   Iron/TIBC/Ferritin/ %Sat    Component Value Date/Time   IRON 44 05/04/2019 1420   TIBC 375 05/04/2019 1420   FERRITIN 5 (L) 05/04/2019 1420   IRONPCTSAT 12 (L) 05/04/2019 1420   Lipid Panel     Component Value Date/Time   CHOL 213 (H) 12/17/2018 1522   TRIG 87 12/17/2018 1522   HDL 71 12/17/2018 1522   CHOLHDL 3.0 12/17/2018 1522   LDLCALC 125 (H) 12/17/2018 1522   Hepatic Function Panel     Component Value Date/Time   PROT 7.7 09/08/2018 1042    ALBUMIN 4.5 09/08/2018 1042   AST 25 09/08/2018 1042   ALT 30 09/08/2018 1042   ALKPHOS 78 09/08/2018 1042   BILITOT 0.8 09/08/2018 1042      Component Value Date/Time   TSH 0.755 09/08/2018 1042   Results for Claunch, Shenee (MRN [id number]) as of 05/23/2019 17:39  Ref. Range 09/08/2018 10:42  Vitamin D, 25-Hydroxy Latest Ref Range: 30.0 - 100.0 ng/mL 9.6 (L)     I, Kirke Corinara Soares, CMA, am acting as transcriptionist for Wilder Gladearen D. Delton Stelle, MD I have reviewed the above documentation for accuracy and completeness, and I agree with the above. -Quillian Quincearen Brittay Mogle, MD

## 2019-06-01 ENCOUNTER — Ambulatory Visit (INDEPENDENT_AMBULATORY_CARE_PROVIDER_SITE_OTHER): Payer: 59 | Admitting: Psychiatry

## 2019-06-01 ENCOUNTER — Other Ambulatory Visit: Payer: Self-pay

## 2019-06-01 ENCOUNTER — Other Ambulatory Visit (INDEPENDENT_AMBULATORY_CARE_PROVIDER_SITE_OTHER): Payer: Self-pay | Admitting: Family Medicine

## 2019-06-01 DIAGNOSIS — F3289 Other specified depressive episodes: Secondary | ICD-10-CM

## 2019-06-01 DIAGNOSIS — F411 Generalized anxiety disorder: Secondary | ICD-10-CM | POA: Diagnosis not present

## 2019-06-02 ENCOUNTER — Telehealth (INDEPENDENT_AMBULATORY_CARE_PROVIDER_SITE_OTHER): Payer: 59 | Admitting: Family Medicine

## 2019-06-02 ENCOUNTER — Encounter (INDEPENDENT_AMBULATORY_CARE_PROVIDER_SITE_OTHER): Payer: Self-pay | Admitting: Family Medicine

## 2019-06-02 ENCOUNTER — Other Ambulatory Visit: Payer: Self-pay

## 2019-06-02 DIAGNOSIS — E669 Obesity, unspecified: Secondary | ICD-10-CM

## 2019-06-02 DIAGNOSIS — F419 Anxiety disorder, unspecified: Secondary | ICD-10-CM

## 2019-06-02 DIAGNOSIS — Z6833 Body mass index (BMI) 33.0-33.9, adult: Secondary | ICD-10-CM

## 2019-06-03 ENCOUNTER — Encounter (HOSPITAL_COMMUNITY): Payer: Self-pay | Admitting: Psychiatry
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
## 2019-06-06 NOTE — Progress Notes (Signed)
Office: [phone]  /  Fax: [phone] TeleHealth Visit:  [name] has verbally consented to this TeleHealth visit today. The patient is located at home, the provider is located at the [facility] office. The participants in this visit include the listed provider and patient. The visit was conducted today via telephone call (FaceTime failed - changed to telephone call).  HPI:   Chief Complaint: OBESITY Patricia Lin is here to discuss her progress with her obesity treatment plan. She is keeping a food journal with 1300-1500 calories and 80 grams of protein and is following her eating plan approximately 0% of the time. She states she is exercising 0 minutes 0 times per week. Patricia Lin states she has done well maintaining her weight but has not been journaling. She has been mindful of her food choices and has tried to portion control. She reports being unable to concentrate on diet and exercise right now. We were unable to weigh the patient today for this TeleHealth visit. She feels as if she has maintained her weight since her last visit. She has lost 6 lbs since starting treatment with us.  Depression with Anxiety Patricia Lin has had a lot of stressors in her life with family health issues and significant home repair issues. She is talking to her therapist and feels she needs to take a leave of absence to help her anxiety. She shows no sign of suicidal or homicidal ideations.  [facility] 05/03/2019 01/28/2019 12/17/2018 09/08/2018  Decreased Interest 3 0 2 1  Down, Depressed, Hopeless 3 0 0 3  PHQ - 2 Score 6 0 2 4  Altered sleeping 3 - - 3  Tired, decreased energy 3 - - 2  Change in appetite 3 - - 2  Feeling bad or failure about yourself  1 - - 1  Trouble concentrating 3 - - 1  Moving slowly or fidgety/restless 0 - - 0  Suicidal thoughts 0 - - 1  PHQ-9 Score 19 - - 14  Difficult doing work/chores Somewhat difficult - - Somewhat difficult   ASSESSMENT AND PLAN:   Anxiety  Class 1 obesity with serious comorbidity and body mass index (BMI) of 33.0 to 33.9 in adult, unspecified obesity type  PLAN:  Depression with Emotional Eating Behaviors Patricia Lin was offered support and advised to let us know if her mood worsens. She will get back to weight loss efforts when she is able to concentrate on it again.  I spent > than 50% of the 15 minute visit on counseling as documented in the note.  Obesity Patricia Lin is not currently in the action stage of change. As such, her goal is to maintain weight for now until she is ready and able to concentrate on her health.  She has agreed to keep a food journal with 1300-1500 calories and 80+ grams of protein daily.  Patricia Lin has been instructed to work up to a goal of 150 minutes of combined cardio and strengthening exercise per week for weight loss and overall health benefits. We discussed the following Behavioral Modification Strategies today: work on meal planning and easy cooking plans, better snacking choices, and emotional eating strategies.  Patricia Lin has agreed to follow-up with our clinic in 3 weeks. She was informed of the importance of frequent follow-up visits to maximize her success with intensive lifestyle modifications for her multiple health conditions.  ALLERGIES: Allergies  Allergen Reactions  . Latex Itching and Rash    MEDICATIONS: Current Outpatient Medications on File Prior  to Visit  Medication Sig Dispense Refill  . ferrous sulfate 325 (65 FE) MG tablet Take 1 tablet (325 mg total) by mouth 3 (three) times daily with meals. 90 tablet 3  . topiramate (TOPAMAX) 25 MG tablet Take 25 mg by mouth daily.    . Vitamin D, Ergocalciferol, (DRISDOL) 1.25 MG (50000 UT) CAPS capsule Take 1 capsule (50,000 Units total) by mouth every 7 (seven) days. 4 capsule 0   No current facility-administered medications on file prior to visit.     PAST MEDICAL HISTORY: Past Medical History:  Diagnosis Date  . Abnormal  Pap smear   . Anemia   . Anxiety   . Back pain   . BV (bacterial vaginosis) 2011  . Chlamydia infection   . CIN I (cervical intraepithelial neoplasia I) 2008  . Depression   . Dyspnea   . Dysrhythmia 2009   TACHYCARDIA;   HAD W/U 2010? POSSIBLE HEART VALVE ISSUE; NOT F/U NEEDED X 3 YEARS PER PT  . Dysuria 2010  . Fatigue   . H/O pre-eclampsia in prior pregnancy, currently pregnant   . H/O varicella   . H/O vitamin D deficiency   . Headache(784.0)    MIGRAINES  . HTN (hypertension)   . Hx of breast reduction, elective   . Hx: UTI (urinary tract infection)   . Infection    UTI DURING PREGNANCY  . Infection    YEAST WITH PREGNANCY  . Infection    CHLAMYDIA X 1  . Knee pain   . 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 Low iron   . PIH (pregnancy induced hypertension) 11-2012   Today pp one month.  B/P nl, and 122/76 on repeat-sitting  . Postpartum depression 2011   NO MEDS  . Pregnancy induced hypertension    ALL PREGNANCIES  . Shortness of breath    WITH EXERTION SICNE PREGNANCY  . Tachycardia   . Tachycardia 2010   HAS HAD WORKUP. UNSURE IF HAS POSSIBLE HEART VALVE ISSUE  . Vulvitis 2009    PAST SURGICAL HISTORY: Past Surgical History:  Procedure Laterality Date  . BREAST REDUCTION SURGERY  2008  . BREAST SURGERY    . DILATION AND CURETTAGE OF UTERUS    . FINGER SURGERY     Right index finger  . TUBAL LIGATION  01/09/2013   Procedure: POST PARTUM TUBAL LIGATION;  Surgeon: Betsy Coder, MD;  Location: [facility];  Service: Gynecology;  Laterality: Bilateral;  post partum tubal ligation bilateral  . US ECHOCARDIOGRAPHY  06/18/2009   ef 55-60%  . WISDOM TOOTH EXTRACTION      SOCIAL HISTORY: Social History   Tobacco Use  . Smoking status: Passive Smoke Exposure - Never Smoker  . Smokeless tobacco: Never Used  Substance Use Topics  . Alcohol use: Yes    Comment: socially  . Drug use: No    FAMILY  HISTORY: Family History  Problem Relation Age of Onset  . Hypertension Mother   . Hyperlipidemia Mother   . Coronary artery disease Mother   . Heart disease Mother        high cholesterol  . Asthma Mother   . Diabetes Mother   . Depression Mother   . Anxiety disorder Mother   . Sleep apnea Mother   . Obesity Mother   . Eating disorder Mother   . Hypertension Father   . Hypertension Maternal Grandmother   . Hypertension Maternal Grandfather   .  Asthma Daughter   . Alcohol abuse Maternal Uncle   . Drug abuse Maternal Uncle    ROS: Review of Systems  Psychiatric/Behavioral: Positive for depression. Negative for suicidal ideas. The patient is nervous/anxious.        Negative for homicidal ideas.   PHYSICAL EXAM: Pt in no acute distress  RECENT LABS AND TESTS: BMET    Component Value Date/Time   NA 137 12/17/2018 1522   K 3.9 12/17/2018 1522   CL 99 12/17/2018 1522   CO2 22 12/17/2018 1522   GLUCOSE 85 12/17/2018 1522   GLUCOSE 95 12/05/2017 0747   BUN 15 12/17/2018 1522   CREATININE 0.83 12/17/2018 1522   CREATININE 0.64 01/17/2013 1720   CALCIUM 9.6 12/17/2018 1522   GFRNONAA 90 12/17/2018 1522   GFRAA 103 12/17/2018 1522   Lab Results  Component Value Date   HGBA1C 5.2 09/08/2018   Lab Results  Component Value Date   INSULIN 11.5 09/08/2018   CBC    Component Value Date/Time   WBC 4.4 05/04/2019 1420   WBC 6.0 12/06/2017 0000   RBC 3.94 05/04/2019 1420   RBC 4.52 12/06/2017 0000   HGB 9.0 (L) 05/04/2019 1420   HCT 28.7 (L) 05/04/2019 1420   PLT 328 05/04/2019 1420   MCV 73 (L) 05/04/2019 1420   MCH 22.8 (L) 05/04/2019 1420   MCH 19.7 (L) 12/06/2017 0000   MCHC 31.4 (L) 05/04/2019 1420   MCHC 29.5 (L) 12/06/2017 0000   RDW 16.3 (H) 05/04/2019 1420   LYMPHSABS 1.5 05/04/2019 1420   MONOABS 0.3 12/04/2017 1858   EOSABS 0.1 05/04/2019 1420   BASOSABS 0.0 05/04/2019 1420   Iron/TIBC/Ferritin/ %Sat    Component Value Date/Time   IRON 44  05/04/2019 1420   TIBC 375 05/04/2019 1420   FERRITIN 5 (L) 05/04/2019 1420   IRONPCTSAT 12 (L) 05/04/2019 1420   Lipid Panel     Component Value Date/Time   CHOL 213 (H) 12/17/2018 1522   TRIG 87 12/17/2018 1522   HDL 71 12/17/2018 1522   CHOLHDL 3.0 12/17/2018 1522   LDLCALC 125 (H) 12/17/2018 1522   Hepatic Function Panel     Component Value Date/Time   PROT 7.7 09/08/2018 1042   ALBUMIN 4.5 09/08/2018 1042   AST 25 09/08/2018 1042   ALT 30 09/08/2018 1042   ALKPHOS 78 09/08/2018 1042   BILITOT 0.8 09/08/2018 1042      Component Value Date/Time   TSH 0.755 09/08/2018 1042   Results for Bake, Patricia Lin (MRN [id number]) as of 06/06/2019 11:39  Ref. Range 09/08/2018 10:42  Vitamin D, 25-Hydroxy Latest Ref Range: 30.0 - 100.0 ng/mL 9.6 (L)    I, Patricia Lin, am acting as Energy managertranscriptionist for Quillian Quincearen Latyra Jaye, MD I have reviewed the above documentation for accuracy and completeness, and I agree with the above. -Quillian Quincearen Ferguson Gertner, MD

## 2019-06-07 ENCOUNTER — Other Ambulatory Visit (INDEPENDENT_AMBULATORY_CARE_PROVIDER_SITE_OTHER): Payer: Self-pay | Admitting: Family Medicine

## 2019-06-13 ENCOUNTER — Telehealth: Payer: Self-pay

## 2019-06-13 ENCOUNTER — Encounter: Payer: Self-pay | Admitting: Nurse Practitioner

## 2019-06-13 ENCOUNTER — Telehealth: Payer: Self-pay | Admitting: *Deleted

## 2019-06-13 DIAGNOSIS — Z20822 Contact with and (suspected) exposure to covid-19: Secondary | ICD-10-CM

## 2019-06-13 NOTE — Telephone Encounter (Signed)
-----   Message from [place], [place] sent at 06/13/2019 11:18 AM EDT ----- Patient needs to be tested she was recently at the beach. [phone]

## 2019-06-13 NOTE — Telephone Encounter (Signed)
Patient sent a message in the patient portal stating she had [place] been [place] the beach on [place] and she needs to be tested. Pt consented to a virtual appt on 07/07. YRL,RMA

## 2019-06-13 NOTE — Telephone Encounter (Signed)
Schedule patient an appt

## 2019-06-13 NOTE — Telephone Encounter (Signed)
LM for patient to call [phone] M-F 7a-7p to schedule covid testing. Order placed

## 2019-06-14 ENCOUNTER — Other Ambulatory Visit: Payer: Self-pay

## 2019-06-14 ENCOUNTER — Encounter: Payer: Self-pay | Admitting: Nurse Practitioner

## 2019-06-14 ENCOUNTER — Ambulatory Visit (INDEPENDENT_AMBULATORY_CARE_PROVIDER_SITE_OTHER): Payer: 59 | Admitting: Nurse Practitioner

## 2019-06-14 VITALS — BP 130/85 | HR 97 | Temp 97.9°F | Wt 198.4 lb

## 2019-06-14 DIAGNOSIS — Z0489 Encounter for examination and observation for other specified reasons: Secondary | ICD-10-CM

## 2019-06-14 DIAGNOSIS — Z20822 Contact with and (suspected) exposure to covid-19: Secondary | ICD-10-CM

## 2019-06-14 NOTE — Progress Notes (Signed)
Virtual Visit via Video   This visit type was conducted due to national recommendations for restrictions regarding the COVID-19 Pandemic (e.g. social distancing) in an effort to limit this patient's exposure and mitigate transmission in our community.  Due to her co-morbid illnesses, this patient is at least at moderate risk for complications without adequate follow up.  This format is felt to be most appropriate for this patient at this time.  All issues noted in this document were discussed and addressed.  A limited physical exam was performed with this format.    This visit type was conducted due to national recommendations for restrictions regarding the COVID-19 Pandemic (e.g. social distancing) in an effort to limit this patient's exposure and mitigate transmission in our community.  Patients identity confirmed using two different identifiers.  This format is felt to be most appropriate for this patient at this time.  All issues noted in this document were discussed and addressed.  No physical exam was performed (except for noted visual exam findings with Video Visits).    Date:  06/26/2019   ID:  Patricia Lin, DOB March 16, 1981, MRN [id number]  Patient Location:  Home - spoke with [place]  Provider location:   Office    Chief Complaint:  Needs to be tested for COVID-19  History of Present Illness:    Patricia Lin is a 38 y.o. female who presents via video conferencing for a telehealth visit today.    The patient does not have symptoms concerning for COVID-19 infection (fever, chills, cough, or new shortness of breath).   Visit made to evaluate for coronavirus to go back to work  Went to [employer] and returned from May 24, 2019 and due to the mandate she was to be in self isolation for 14 days which would have been completed on June 30th.    No symptoms.     Past Medical History:  Diagnosis Date  . Abnormal Pap smear   . Anemia   . Anxiety   . Back pain   . BV (bacterial  vaginosis) 2011  . Chlamydia infection   . CIN I (cervical intraepithelial neoplasia I) 2008  . Depression   . Dyspnea   . Dysrhythmia 2009   TACHYCARDIA;   HAD W/U 2010? POSSIBLE HEART VALVE ISSUE; NOT F/U NEEDED X 3 YEARS PER PT  . Dysuria 2010  . Fatigue   . H/O pre-eclampsia in prior pregnancy, currently pregnant   . H/O varicella   . H/O vitamin D deficiency   . Headache(784.0)    MIGRAINES  . HTN (hypertension)   . Hx of breast reduction, elective   . Hx: UTI (urinary tract infection)   . Infection    UTI DURING PREGNANCY  . Infection    YEAST WITH PREGNANCY  . Infection    CHLAMYDIA X 1  . Knee pain   . 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 Low iron   . PIH (pregnancy induced hypertension) 11-2012   Today pp one month.  B/P nl, and 122/76 on repeat-sitting  . Postpartum depression 2011   NO MEDS  . Pregnancy induced hypertension    ALL PREGNANCIES  . Shortness of breath    WITH EXERTION SICNE PREGNANCY  . Tachycardia   . Tachycardia 2010   HAS HAD WORKUP. UNSURE IF HAS POSSIBLE HEART VALVE ISSUE  . Vulvitis 2009   Past Surgical History:  Procedure Laterality Date  . BREAST  REDUCTION SURGERY  2008  . BREAST SURGERY    . DILATION AND CURETTAGE OF UTERUS    . FINGER SURGERY     Right index finger  . TUBAL LIGATION  01/09/2013   Procedure: POST PARTUM TUBAL LIGATION;  Surgeon: Michael LitterNaima A Dillard, MD;  Location: [facility];  Service: Gynecology;  Laterality: Bilateral;  post partum tubal ligation bilateral  . US ECHOCARDIOGRAPHY  06/18/2009   ef 55-60%  . WISDOM TOOTH EXTRACTION       Current Meds  Medication Sig  . ferrous sulfate 325 (65 FE) MG tablet Take 1 tablet (325 mg total) by mouth 3 (three) times daily with meals.  . topiramate (TOPAMAX) 25 MG tablet Take 25 mg by mouth daily.  . [DISCONTINUED] Vitamin D, Ergocalciferol, (DRISDOL) 1.25 MG (50000 UT) CAPS capsule Take 1 capsule (50,000 Units total) by  mouth every 7 (seven) days.     Allergies:   Latex   Social History   Tobacco Use  . Smoking status: Passive Smoke Exposure - Never Smoker  . Smokeless tobacco: Never Used  Substance Use Topics  . Alcohol use: Yes    Comment: socially  . Drug use: No     Family Hx: The patient's family history includes Alcohol abuse in her maternal uncle; Anxiety disorder in her mother; Asthma in her daughter and mother; Coronary artery disease in her mother; Depression in her mother; Diabetes in her mother; Drug abuse in her maternal uncle; Eating disorder in her mother; Heart disease in her mother; Hyperlipidemia in her mother; Hypertension in her father, maternal grandfather, maternal grandmother, and mother; Obesity in her mother; Sleep apnea in her mother.  ROS:   Please see the history of present illness.    Review of Systems  Constitutional: Negative.   Respiratory: Negative.   Cardiovascular: Negative.   Gastrointestinal: Negative.   Musculoskeletal: Negative.   Neurological: Negative.  Negative for dizziness and headaches.    All other systems reviewed and are negative.   Labs/Other Tests and Data Reviewed:    Recent Labs: 09/08/2018: ALT 30; TSH 0.755 12/17/2018: BUN 15; Creatinine, Ser 0.83; Potassium 3.9; Sodium 137 05/04/2019: Hemoglobin 9.0; Platelets 328   Recent Lipid Panel Lab Results  Component Value Date/Time   CHOL 213 (H) 12/17/2018 03:22 PM   TRIG 87 12/17/2018 03:22 PM   HDL 71 12/17/2018 03:22 PM   CHOLHDL 3.0 12/17/2018 03:22 PM   LDLCALC 125 (H) 12/17/2018 03:22 PM    Wt Readings from Last 3 Encounters:  06/14/19 198 lb 6.4 oz (90 kg)  05/03/19 195 lb (88.5 kg)  02/01/19 185 lb (83.9 kg)     Exam:    Vital Signs:  BP 130/85   Pulse 97   Temp 97.9 F (36.6 C) (Oral)   Wt 198 lb 6.4 oz (90 kg)   LMP 06/11/2019   BMI 36.29 kg/m     Physical Exam  Constitutional: She is oriented to person, place, and time and well-developed, well-nourished, and in  no distress.  Pulmonary/Chest: Effort normal.  Neurological: She is alert and oriented to person, place, and time.  Psychiatric: Mood, memory, affect and judgment normal.    ASSESSMENT & PLAN:    1. Suspected Covid-19 Virus Infection  No current symptoms needs to be ruled out [place]"  I have advised her to call the [facility] back to have done before Friday since her date of return was June  16th and we are past the 14 day quarantine.   Advised if start with any symptoms to call office   COVID-19 Education: The signs and symptoms of COVID-19 were discussed with the patient and how to seek care for testing (follow up with PCP or arrange E-visit).  The importance of social distancing was discussed today.  Patient Risk:   After full review of this patients clinical status, I feel that they are at least moderate risk at this time.  Time:   Today, I have spent 11 minutes/ seconds with the patient with telehealth technology discussing above diagnoses.     Medication Adjustments/Labs and Tests Ordered: Current medicines are reviewed at length with the patient today.  Concerns regarding medicines are outlined above.   Tests Ordered: No orders of the defined types were placed in this encounter.   Medication Changes: No orders of the defined types were placed in this encounter.   Disposition:  Follow up prn  Signed, Arnette FeltsJanece Mitcheal Sweetin, FNP

## 2019-06-14 NOTE — Telephone Encounter (Signed)
Pt has appt [facility] 7.10.20 at 12pm

## 2019-06-17 ENCOUNTER — Other Ambulatory Visit: Payer: Self-pay

## 2019-06-17 DIAGNOSIS — Z20822 Contact with and (suspected) exposure to covid-19: Secondary | ICD-10-CM

## 2019-06-20 ENCOUNTER — Other Ambulatory Visit: Payer: Self-pay

## 2019-06-20 ENCOUNTER — Ambulatory Visit (INDEPENDENT_AMBULATORY_CARE_PROVIDER_SITE_OTHER): Payer: 59 | Admitting: Psychiatry

## 2019-06-20 DIAGNOSIS — F3289 Other specified depressive episodes: Secondary | ICD-10-CM | POA: Diagnosis not present

## 2019-06-20 DIAGNOSIS — F411 Generalized anxiety disorder: Secondary | ICD-10-CM | POA: Diagnosis not present

## 2019-06-21 ENCOUNTER — Encounter (HOSPITAL_COMMUNITY): Payer: Self-pay | Admitting: Psychiatry

## 2019-06-21 NOTE — Progress Notes (Signed)
Virtual Visit via Video Note  I connected with Orvella Bevans on 06/21/19 at  4:00 PM EDT by a video enabled telemedicine application and verified that I am speaking with the correct person using two identifiers.  Location: Patient: Patricia Lin Provider: Lise Auer, LCSW   I discussed the limitations of evaluation and management by telemedicine and the availability of in person appointments. The patient expressed understanding and agreed to proceed.  History of Present Illness: GAD and Depression   Observations/Objective: Counselor met with Keyaira for individual therapy via Webex. Counselor assessed MH symptoms and progress on treatment plan goals. Yoseline denied suicidal ideation or self-harm behaviors. Cherry shared that she has secured extended time out of work due to the increased anxiety and panic responses she had upon return to work. Counselor and Janicia discussed her plans for alternative employment to better meet her current needs, passions and lifestyle. Counselor created an action plan with Roselinda around the steps it will take to make her dreams become reality. Counselor explored family stressors with Dalylah, including marital, financial and parenting stressors. Lamis shared about her parental influences growing up and how she has devoted her motherhood to providing more and better for her children than what she was provided. Counselor discussed effective communication skills with her spouse and upcoming individuals as she pursues her new career. Fizza will work on completing tasks discussed in session before our next meeting and report back.   Assessment and Plan: Counselor will continue to meet with Alika to address treatment plan goals. Tayden will continue to follow recommendations of providers and implement skills learned in session.  Follow Up Instructions: Counselor will send information for next session via Webex.     I discussed the assessment and treatment plan with the  patient. The patient was provided an opportunity to ask questions and all were answered. The patient agreed with the plan and demonstrated an understanding of the instructions.   The patient was advised to call back or seek an in-person evaluation if the symptoms worsen or if the condition fails to improve as anticipated.  I provided 35 minutes of non-face-to-face time during this encounter.   Lise Auer, LCSW

## 2019-06-22 ENCOUNTER — Encounter (INDEPENDENT_AMBULATORY_CARE_PROVIDER_SITE_OTHER): Payer: Self-pay | Admitting: Family Medicine

## 2019-06-22 ENCOUNTER — Telehealth (INDEPENDENT_AMBULATORY_CARE_PROVIDER_SITE_OTHER): Payer: 59 | Admitting: Family Medicine

## 2019-06-22 ENCOUNTER — Other Ambulatory Visit: Payer: Self-pay

## 2019-06-22 DIAGNOSIS — E559 Vitamin D deficiency, unspecified: Secondary | ICD-10-CM | POA: Diagnosis not present

## 2019-06-22 DIAGNOSIS — Z6833 Body mass index (BMI) 33.0-33.9, adult: Secondary | ICD-10-CM | POA: Diagnosis not present

## 2019-06-22 DIAGNOSIS — E669 Obesity, unspecified: Secondary | ICD-10-CM | POA: Diagnosis not present

## 2019-06-22 LAB — NOVEL CORONAVIRUS, NAA: SARS-CoV-2, NAA: NOT DETECTED

## 2019-06-22 MED ORDER — VITAMIN D (ERGOCALCIFEROL) 1.25 MG (50000 UNIT) PO CAPS: 50000.0000 [IU] | ORAL_CAPSULE | ORAL | 0 refills | Status: DC

## 2019-06-23 ENCOUNTER — Telehealth: Payer: Self-pay

## 2019-06-23 ENCOUNTER — Encounter: Payer: Self-pay | Admitting: Nurse Practitioner

## 2019-06-23 NOTE — Progress Notes (Signed)
Office: [phone]  /  Fax: [phone] TeleHealth Visit:  [name] has verbally consented to this TeleHealth visit today. The patient is located at home, the provider is located at the [facility] office. The participants in this visit include the listed provider and patient and any and all parties involved. The visit was conducted today via FaceTime.  HPI:   Chief Complaint: OBESITY Patricia Lin is here to discuss her progress with her obesity treatment plan. She is on the keep a food journal with 1300 to 1500 calories and 80+ grams of protein plan and is following her eating plan approximately 0 % of the time. She states she is exercising 0 minutes 0 times per week. Christianna feels she has done well maintaining her weight, and she weighed 198 pounds this morning. We were unable to weigh the patient today for this TeleHealth visit. She feels as if she has maintained weight since her last visit. She has gained 7 lbs since starting treatment with [place].  Vitamin D deficiency [name] has a diagnosis of vitamin D deficiency. Her last level was not at goal. [name] is stable on vit D and she denies nausea, vomiting or muscle weakness.  ASSESSMENT AND PLAN:  Vitamin D deficiency - Plan: Vitamin D, Ergocalciferol, (DRISDOL) 1.25 MG (50000 UT) CAPS capsule  Class 1 obesity with serious comorbidity and body mass index (BMI) of 33.0 to 33.9 in adult, unspecified obesity type  PLAN:  Vitamin D Deficiency [name] was informed that low vitamin D levels contributes to fatigue and are associated with obesity, breast, and colon cancer. She agrees to continue to take prescription Vit D @50 ,000 IU every week #4 with no refills and will follow up for routine testing of vitamin D, at least 2-3 times per year. She was informed of the risk of over-replacement of vitamin D and agrees to not increase her dose unless she discusses this with [place] first. We will check labs in 1 month and [name] agrees to  follow up as directed.  Obesity [name] is currently in the action stage of change. As such, her goal is to continue with weight loss efforts She has agreed to keep a food journal with 1500 calories and 80+ grams of protein daily [name] has been instructed to work up to a goal of 150 minutes of combined cardio and strengthening exercise per week for weight loss and overall health benefits. We discussed the following Behavioral Modification Strategies today: increase H2O intake, keep a strict food journal, increasing lean protein intake and work on meal planning and easy cooking plans  [name] has agreed to follow up with our clinic in 2 weeks. She was informed of the importance of frequent follow up visits to maximize her success with intensive lifestyle modifications for her multiple health conditions.  ALLERGIES: Allergies  Allergen Reactions  . Latex Itching and Rash    MEDICATIONS: Current Outpatient Medications on File Prior to Visit  Medication Sig Dispense Refill  . ferrous sulfate 325 (65 FE) MG tablet Take 1 tablet (325 mg total) by mouth 3 (three) times daily with meals. 90 tablet 3  . topiramate (TOPAMAX) 25 MG tablet Take 25 mg by mouth daily.     No current facility-administered medications on file prior to visit.     PAST MEDICAL HISTORY: Past Medical History:  Diagnosis Date  . Abnormal Pap smear   . Anemia   . Anxiety   . Back pain   . BV (bacterial vaginosis) 2011  .  Chlamydia infection   . CIN I (cervical intraepithelial neoplasia I) 2008  . Depression   . Dyspnea   . Dysrhythmia 2009   TACHYCARDIA;   HAD W/U 2010? POSSIBLE HEART VALVE ISSUE; NOT F/U NEEDED X 3 YEARS PER PT  . Dysuria 2010  . Fatigue   . H/O pre-eclampsia in prior pregnancy, currently pregnant   . H/O varicella   . H/O vitamin D deficiency   . Headache(784.0)    MIGRAINES  . HTN (hypertension)   . Hx of breast reduction, elective   . Hx: UTI (urinary tract infection)   . Infection     UTI DURING PREGNANCY  . Infection    YEAST WITH PREGNANCY  . Infection    CHLAMYDIA X 1  . Knee pain   . 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 Low iron   . PIH (pregnancy induced hypertension) 11-2012   Today pp one month.  B/P nl, and 122/76 on repeat-sitting  . Postpartum depression 2011   NO MEDS  . Pregnancy induced hypertension    ALL PREGNANCIES  . Shortness of breath    WITH EXERTION SICNE PREGNANCY  . Tachycardia   . Tachycardia 2010   HAS HAD WORKUP. UNSURE IF HAS POSSIBLE HEART VALVE ISSUE  . Vulvitis 2009    PAST SURGICAL HISTORY: Past Surgical History:  Procedure Laterality Date  . BREAST REDUCTION SURGERY  2008  . BREAST SURGERY    . DILATION AND CURETTAGE OF UTERUS    . FINGER SURGERY     Right index finger  . TUBAL LIGATION  01/09/2013   Procedure: POST PARTUM TUBAL LIGATION;  Surgeon: Michael LitterNaima A Dillard, MD;  Location: [facility];  Service: Gynecology;  Laterality: Bilateral;  post partum tubal ligation bilateral  . US ECHOCARDIOGRAPHY  06/18/2009   ef 55-60%  . WISDOM TOOTH EXTRACTION      SOCIAL HISTORY: Social History   Tobacco Use  . Smoking status: Passive Smoke Exposure - Never Smoker  . Smokeless tobacco: Never Used  Substance Use Topics  . Alcohol use: Yes    Comment: socially  . Drug use: No    FAMILY HISTORY: Family History  Problem Relation Age of Onset  . Hypertension Mother   . Hyperlipidemia Mother   . Coronary artery disease Mother   . Heart disease Mother        high cholesterol  . Asthma Mother   . Diabetes Mother   . Depression Mother   . Anxiety disorder Mother   . Sleep apnea Mother   . Obesity Mother   . Eating disorder Mother   . Hypertension Father   . Hypertension Maternal Grandmother   . Hypertension Maternal Grandfather   . Asthma Daughter   . Alcohol abuse Maternal Uncle   . Drug abuse Maternal Uncle     ROS: Review of Systems  Constitutional: Negative  for weight loss.  Gastrointestinal: Negative for nausea and vomiting.  Musculoskeletal:       Negative for muscle weakness    PHYSICAL EXAM: Pt in no acute distress  RECENT LABS AND TESTS: BMET    Component Value Date/Time   NA 137 12/17/2018 1522   K 3.9 12/17/2018 1522   CL 99 12/17/2018 1522   CO2 22 12/17/2018 1522   GLUCOSE 85 12/17/2018 1522   GLUCOSE 95 12/05/2017 0747   BUN 15 12/17/2018 1522   CREATININE 0.83 12/17/2018 1522   CREATININE 0.64  01/17/2013 1720   CALCIUM 9.6 12/17/2018 1522   GFRNONAA 90 12/17/2018 1522   GFRAA 103 12/17/2018 1522   Lab Results  Component Value Date   HGBA1C 5.2 09/08/2018   Lab Results  Component Value Date   INSULIN 11.5 09/08/2018   CBC    Component Value Date/Time   WBC 4.4 05/04/2019 1420   WBC 6.0 12/06/2017 0000   RBC 3.94 05/04/2019 1420   RBC 4.52 12/06/2017 0000   HGB 9.0 (L) 05/04/2019 1420   HCT 28.7 (L) 05/04/2019 1420   PLT 328 05/04/2019 1420   MCV 73 (L) 05/04/2019 1420   MCH 22.8 (L) 05/04/2019 1420   MCH 19.7 (L) 12/06/2017 0000   MCHC 31.4 (L) 05/04/2019 1420   MCHC 29.5 (L) 12/06/2017 0000   RDW 16.3 (H) 05/04/2019 1420   LYMPHSABS 1.5 05/04/2019 1420   MONOABS 0.3 12/04/2017 1858   EOSABS 0.1 05/04/2019 1420   BASOSABS 0.0 05/04/2019 1420   Iron/TIBC/Ferritin/ %Sat    Component Value Date/Time   IRON 44 05/04/2019 1420   TIBC 375 05/04/2019 1420   FERRITIN 5 (L) 05/04/2019 1420   IRONPCTSAT 12 (L) 05/04/2019 1420   Lipid Panel     Component Value Date/Time   CHOL 213 (H) 12/17/2018 1522   TRIG 87 12/17/2018 1522   HDL 71 12/17/2018 1522   CHOLHDL 3.0 12/17/2018 1522   LDLCALC 125 (H) 12/17/2018 1522   Hepatic Function Panel     Component Value Date/Time   PROT 7.7 09/08/2018 1042   ALBUMIN 4.5 09/08/2018 1042   AST 25 09/08/2018 1042   ALT 30 09/08/2018 1042   ALKPHOS 78 09/08/2018 1042   BILITOT 0.8 09/08/2018 1042      Component Value Date/Time   TSH 0.755 09/08/2018 1042      Ref. Range 09/08/2018 10:42  Vitamin D, 25-Hydroxy Latest Ref Range: 30.0 - 100.0 ng/mL 9.6 (L)    I, [place] CraneJoanne Murray, am acting as transcriptionist for Quillian Quincearen Romi Rathel, MD I have reviewed the above documentation for accuracy and completeness, and I agree with the above. -Quillian Quincearen Brynlea Spindler, MD

## 2019-06-23 NOTE — Telephone Encounter (Signed)
I have called pt to tell her the results from her COVID test and notify her that she can return back to work tomorrow. Left her a v/m to call the office [facility]

## 2019-06-26 ENCOUNTER — Encounter: Payer: Self-pay | Admitting: Nurse Practitioner

## 2019-07-06 ENCOUNTER — Encounter (INDEPENDENT_AMBULATORY_CARE_PROVIDER_SITE_OTHER): Payer: Self-pay

## 2019-07-06 ENCOUNTER — Encounter (INDEPENDENT_AMBULATORY_CARE_PROVIDER_SITE_OTHER): Payer: Self-pay | Admitting: Family Medicine

## 2019-07-06 ENCOUNTER — Other Ambulatory Visit: Payer: Self-pay

## 2019-07-06 ENCOUNTER — Telehealth (INDEPENDENT_AMBULATORY_CARE_PROVIDER_SITE_OTHER): Payer: 59 | Admitting: Family Medicine

## 2019-07-06 DIAGNOSIS — F3289 Other specified depressive episodes: Secondary | ICD-10-CM | POA: Diagnosis not present

## 2019-07-06 DIAGNOSIS — E669 Obesity, unspecified: Secondary | ICD-10-CM

## 2019-07-06 DIAGNOSIS — R030 Elevated blood-pressure reading, without diagnosis of hypertension: Secondary | ICD-10-CM | POA: Diagnosis not present

## 2019-07-06 DIAGNOSIS — Z6833 Body mass index (BMI) 33.0-33.9, adult: Secondary | ICD-10-CM

## 2019-07-07 NOTE — Progress Notes (Signed)
Office: [phone]  /  Fax: [phone] TeleHealth Visit:  Patricia Lin has verbally consented to this TeleHealth visit today. The patient is located at home, the provider is located at the [facility] office. The participants in this visit include the listed provider and patient and any and all parties involved. The visit was conducted today via FaceTime.  HPI:   Chief Complaint: OBESITY Patricia Lin is here to discuss her progress with her obesity treatment plan. She is on the keep a food journal with 1300 to 1500 calories and 80+ grams of protein daily plan and she is following her eating plan approximately 0 % of the time. She states she is exercising 0 minutes 0 times per week. Patricia Lin feels bloated today, and she states her weight was 199 pounds (increase of 7 lbs). She has not been journaling since our last visit. She states her motivation to change has decreased, and she is not meeting her protein goals. Patricia Lin thinks she is eating a lot of calories overall. We were unable to weigh the patient today for this TeleHealth visit. She feels as if she has gained weight since her last visit. She has lost 6 lbs since starting treatment with us.  Elevated Blood Pressure without history of hypertension Patricia Lin is a 38 y.o. female with elevated blood pressure. Patient states her blood pressure is 138/96 today. Patricia Lin has been gaining weight, and she feels bloated today. She has no history of hypertension in the past.  Depression with emotional eating behaviors Patricia Lin is on Topamax 25 mg, and she feels it is no longer helping to decrease her emotional eating. She was first started on 50 mg, but she thought that dose made her jittery. Patricia Lin struggles with emotional eating and using food for comfort to the extent that it is negatively impacting her health. She often snacks when she is not hungry. Patricia Lin sometimes feels she is out of control and then feels guilty that she made poor food  choices. She has been working on behavior modification techniques to help reduce her emotional eating and has been somewhat successful. She shows no sign of suicidal or homicidal ideations.  ASSESSMENT AND PLAN:  Blood pressure elevated without history of HTN  Other depression  Class 1 obesity with serious comorbidity and body mass index (BMI) of 33.0 to 33.9 in adult, unspecified obesity type  PLAN:  Elevated Blood Pressure without history of hypertension Patricia Lin will continue to check her blood pressure at home, and she will get back to diet, exercise and weight loss. [name] agrees to follow up with our clinic in 2 weeks.  Depression with Emotional Eating Behaviors We discussed behavior modification techniques today to help Patricia Lin deal with her emotional eating and depression. She has agreed to increase topamax back to 50 mg and see if she tolerates that higher dose better. Patricia Lin agreed to follow up as directed.  I spent > than 50% of the 25 minute visit on counseling as documented in the note.  Obesity Patricia Lin is currently in the action stage of change. As such, her goal is to continue with weight loss efforts She has agreed to follow our protein rich vegetarian plan (sending through MyChart) [name] has been instructed to work up to a goal of 150 minutes of combined cardio and strengthening exercise per week for weight loss and overall health benefits. We discussed the following Behavioral Modification Strategies today: increase H2O and increasing vegetables  Patricia Lin has agreed to follow up with our  clinic in 2 weeks. She was informed of the importance of frequent follow up visits to maximize her success with intensive lifestyle modifications for her multiple health conditions.  ALLERGIES: Allergies  Allergen Reactions  . Latex Itching and Rash    MEDICATIONS: Current Outpatient Medications on File Prior to Visit  Medication Sig Dispense Refill  . ferrous sulfate 325 (65 FE) MG  tablet Take 1 tablet (325 mg total) by mouth 3 (three) times daily with meals. 90 tablet 3  . topiramate (TOPAMAX) 25 MG tablet Take 25 mg by mouth daily.    . Vitamin D, Ergocalciferol, (DRISDOL) 1.25 MG (50000 UT) CAPS capsule Take 1 capsule (50,000 Units total) by mouth every 7 (seven) days. 4 capsule 0   No current facility-administered medications on file prior to visit.     PAST MEDICAL HISTORY: Past Medical History:  Diagnosis Date  . Abnormal Pap smear   . Anemia   . Anxiety   . Back pain   . BV (bacterial vaginosis) 2011  . Chlamydia infection   . CIN I (cervical intraepithelial neoplasia I) 2008  . Depression   . Dyspnea   . Dysrhythmia 2009   TACHYCARDIA;   HAD W/U 2010? POSSIBLE HEART VALVE ISSUE; NOT F/U NEEDED X 3 YEARS PER PT  . Dysuria 2010  . Fatigue   . H/O pre-eclampsia in prior pregnancy, currently pregnant   . H/O varicella   . H/O vitamin D deficiency   . Headache(784.0)    MIGRAINES  . HTN (hypertension)   . Hx of breast reduction, elective   . Hx: UTI (urinary tract infection)   . Infection    UTI DURING PREGNANCY  . Infection    YEAST WITH PREGNANCY  . Infection    CHLAMYDIA X 1  . Knee pain   . 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 Low iron   . PIH (pregnancy induced hypertension) 11-2012   Today pp one month.  B/P nl, and 122/76 on repeat-sitting  . Postpartum depression 2011   NO MEDS  . Pregnancy induced hypertension    ALL PREGNANCIES  . Shortness of breath    WITH EXERTION SICNE PREGNANCY  . Tachycardia   . Tachycardia 2010   HAS HAD WORKUP. UNSURE IF HAS POSSIBLE HEART VALVE ISSUE  . Vulvitis 2009    PAST SURGICAL HISTORY: Past Surgical History:  Procedure Laterality Date  . BREAST REDUCTION SURGERY  2008  . BREAST SURGERY    . DILATION AND CURETTAGE OF UTERUS    . FINGER SURGERY     Right index finger  . TUBAL LIGATION  01/09/2013   Procedure: POST PARTUM TUBAL  LIGATION;  Surgeon: Betsy Coder, MD;  Location: [facility];  Service: Gynecology;  Laterality: Bilateral;  post partum tubal ligation bilateral  . US ECHOCARDIOGRAPHY  06/18/2009   ef 55-60%  . WISDOM TOOTH EXTRACTION      SOCIAL HISTORY: Social History   Tobacco Use  . Smoking status: Passive Smoke Exposure - Never Smoker  . Smokeless tobacco: Never Used  Substance Use Topics  . Alcohol use: Yes    Comment: socially  . Drug use: No    FAMILY HISTORY: Family History  Problem Relation Age of Onset  . Hypertension Mother   . Hyperlipidemia Mother   . Coronary artery disease Mother   . Heart disease Mother        high cholesterol  . Asthma Mother   .  Diabetes Mother   . Depression Mother   . Anxiety disorder Mother   . Sleep apnea Mother   . Obesity Mother   . Eating disorder Mother   . Hypertension Father   . Hypertension Maternal Grandmother   . Hypertension Maternal Grandfather   . Asthma Daughter   . Alcohol abuse Maternal Uncle   . Drug abuse Maternal Uncle     ROS: Review of Systems  Constitutional: Negative for weight loss.  Psychiatric/Behavioral: Positive for depression. Negative for suicidal ideas.    PHYSICAL EXAM: Pt in no acute distress  RECENT LABS AND TESTS: BMET    Component Value Date/Time   NA 137 12/17/2018 1522   K 3.9 12/17/2018 1522   CL 99 12/17/2018 1522   CO2 22 12/17/2018 1522   GLUCOSE 85 12/17/2018 1522   GLUCOSE 95 12/05/2017 0747   BUN 15 12/17/2018 1522   CREATININE 0.83 12/17/2018 1522   CREATININE 0.64 01/17/2013 1720   CALCIUM 9.6 12/17/2018 1522   GFRNONAA 90 12/17/2018 1522   GFRAA 103 12/17/2018 1522   Lab Results  Component Value Date   HGBA1C 5.2 09/08/2018   Lab Results  Component Value Date   INSULIN 11.5 09/08/2018   CBC    Component Value Date/Time   WBC 4.4 05/04/2019 1420   WBC 6.0 12/06/2017 0000   RBC 3.94 05/04/2019 1420   RBC 4.52 12/06/2017 0000   HGB 9.0 (L) 05/04/2019 1420   HCT 28.7 (L)  05/04/2019 1420   PLT 328 05/04/2019 1420   MCV 73 (L) 05/04/2019 1420   MCH 22.8 (L) 05/04/2019 1420   MCH 19.7 (L) 12/06/2017 0000   MCHC 31.4 (L) 05/04/2019 1420   MCHC 29.5 (L) 12/06/2017 0000   RDW 16.3 (H) 05/04/2019 1420   LYMPHSABS 1.5 05/04/2019 1420   MONOABS 0.3 12/04/2017 1858   EOSABS 0.1 05/04/2019 1420   BASOSABS 0.0 05/04/2019 1420   Iron/TIBC/Ferritin/ %Sat    Component Value Date/Time   IRON 44 05/04/2019 1420   TIBC 375 05/04/2019 1420   FERRITIN 5 (L) 05/04/2019 1420   IRONPCTSAT 12 (L) 05/04/2019 1420   Lipid Panel     Component Value Date/Time   CHOL 213 (H) 12/17/2018 1522   TRIG 87 12/17/2018 1522   HDL 71 12/17/2018 1522   CHOLHDL 3.0 12/17/2018 1522   LDLCALC 125 (H) 12/17/2018 1522   Hepatic Function Panel     Component Value Date/Time   PROT 7.7 09/08/2018 1042   ALBUMIN 4.5 09/08/2018 1042   AST 25 09/08/2018 1042   ALT 30 09/08/2018 1042   ALKPHOS 78 09/08/2018 1042   BILITOT 0.8 09/08/2018 1042      Component Value Date/Time   TSH 0.755 09/08/2018 1042     Ref. Range 09/08/2018 10:42  Vitamin D, 25-Hydroxy Latest Ref Range: 30.0 - 100.0 ng/mL 9.6 (L)    I, [place] CraneJoanne Murray, am acting as transcriptionist for Quillian Quincearen , MD I have reviewed the above documentation for accuracy and completeness, and I agree with the above. -Quillian Quincearen , MD

## 2019-07-11 ENCOUNTER — Encounter: Payer: 59 | Admitting: Nurse Practitioner

## 2019-07-20 ENCOUNTER — Encounter (INDEPENDENT_AMBULATORY_CARE_PROVIDER_SITE_OTHER): Payer: Self-pay

## 2019-07-20 ENCOUNTER — Telehealth (INDEPENDENT_AMBULATORY_CARE_PROVIDER_SITE_OTHER): Payer: 59 | Admitting: Family Medicine

## 2019-07-20 ENCOUNTER — Encounter (INDEPENDENT_AMBULATORY_CARE_PROVIDER_SITE_OTHER): Payer: Self-pay | Admitting: Family Medicine

## 2019-07-20 ENCOUNTER — Other Ambulatory Visit: Payer: Self-pay

## 2019-07-20 DIAGNOSIS — Z6833 Body mass index (BMI) 33.0-33.9, adult: Secondary | ICD-10-CM | POA: Diagnosis not present

## 2019-07-20 DIAGNOSIS — E559 Vitamin D deficiency, unspecified: Secondary | ICD-10-CM

## 2019-07-20 DIAGNOSIS — E669 Obesity, unspecified: Secondary | ICD-10-CM | POA: Diagnosis not present

## 2019-07-20 DIAGNOSIS — E8881 Metabolic syndrome: Secondary | ICD-10-CM | POA: Diagnosis not present

## 2019-07-20 MED ORDER — VITAMIN D (ERGOCALCIFEROL) 1.25 MG (50000 UNIT) PO CAPS: 50000.0000 [IU] | ORAL_CAPSULE | ORAL | 0 refills | Status: DC

## 2019-07-25 NOTE — Progress Notes (Signed)
Office: [phone]  /  Fax: [phone] TeleHealth Visit:  Patricia Lin has verbally consented to this TeleHealth visit today. The patient is located at home, the provider is located at the [facility] office. The participants in this visit include the listed provider and patient. The visit was conducted today via FaceTime.  HPI:   Chief Complaint: OBESITY Patricia Lin is here to discuss her progress with her obesity treatment plan. She states she is not following a plan. She states she is walking 20-25 minutes 3-4 times per week. Patricia Lin has maintained her weight since her last visit and states she weighs 199 today. She has been struggling to keep a food journal or follow a structured plan. Her 39-year-old daughter wants to eat Pescatarian so she asks if we have a plan like that. We were unable to weigh the patient today for this TeleHealth visit. She feels as if she has maintained her weight since her last visit. She has lost 6 lbs since starting treatment with us.  Vitamin D deficiency Patricia Lin has a diagnosis of Vitamin D deficiency. She is currently stable on prescription Vit D and denies nausea, vomiting or muscle weakness. She is due for labs soon.  Insulin Resistance Patricia Lin has a diagnosis of insulin resistance based on her elevated fasting insulin level >5. Although Patricia Lin's blood glucose readings are still under good control, insulin resistance puts her at greater risk of metabolic syndrome and diabetes. She is working on diet and exercise and is due for labs soon. She is struggling with weight loss and decreasing simple carbs.  ASSESSMENT AND PLAN:  Vitamin D deficiency - Plan: Vitamin D, Ergocalciferol, (DRISDOL) 1.25 MG (50000 UT) CAPS capsule  Insulin resistance  Class 1 obesity with serious comorbidity and body mass index (BMI) of 33.0 to 33.9 in adult, unspecified obesity type  PLAN:  Vitamin D Deficiency Patricia Lin was informed that low Vitamin D levels contributes  to fatigue and are associated with obesity, breast, and colon cancer. She agrees to continue to take prescription Vit D @ 50,000 IU every week #4 with 0 refills and will follow-up for routine testing of Vitamin D at her next visit. She was informed of the risk of over-replacement of Vitamin D and agrees to not increase her dose unless she discusses this with us first. Patricia Lin agrees to follow-up with our clinic in 2-3 weeks.  Insulin Resistance Patricia Lin will continue to work on weight loss, exercise, and decreasing simple carbohydrates in her diet to help decrease the risk of diabetes. We dicussed metformin including benefits and risks. She was informed that eating too many simple carbohydrates or too many calories at one sitting increases the likelihood of GI side effects. Chrystel will change her diet prescription and will have labs checked at her next visit. She will follow-up with us as directed to monitor her progress.  Obesity Patricia Lin is currently in the action stage of change. As such, her goal is to continue with weight loss efforts. She will change and follow the Pescatarian eating plan. Patricia Lin has been instructed to work up to a goal of 150 minutes of combined cardio and strengthening exercise per week for weight loss and overall health benefits. We discussed the following Behavioral Modification Strategies today: increasing lean protein intake, decreasing simple carbohydrates, work on meal planning and easy cooking plans, and emotional eating strategies.  Patricia Lin has agreed to follow-up with our clinic in 2-3 weeks. She was informed of the importance of frequent follow-up visits to maximize  her success with intensive lifestyle modifications for her multiple health conditions.  ALLERGIES: Allergies  Allergen Reactions   Latex Itching and Rash    MEDICATIONS: Current Outpatient Medications on File Prior to Visit  Medication Sig Dispense Refill   ferrous sulfate 325 (65 FE) MG tablet Take 1  tablet (325 mg total) by mouth 3 (three) times daily with meals. 90 tablet 3   topiramate (TOPAMAX) 25 MG tablet Take 25 mg by mouth daily.     No current facility-administered medications on file prior to visit.     PAST MEDICAL HISTORY: Past Medical History:  Diagnosis Date   Abnormal Pap smear    Anemia    Anxiety    Back pain    BV (bacterial vaginosis) 2011   Chlamydia infection    CIN I (cervical intraepithelial neoplasia I) 2008   Depression    Dyspnea    Dysrhythmia 2009   TACHYCARDIA;   HAD W/U 2010? 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  B/P nl, and 122/76 on repeat-sitting   Postpartum depression 2011   NO MEDS   Pregnancy induced hypertension    ALL PREGNANCIES   Shortness of breath    WITH EXERTION SICNE PREGNANCY   Tachycardia    Tachycardia 2010   HAS HAD WORKUP. UNSURE IF HAS POSSIBLE HEART VALVE ISSUE   Vulvitis 2009    PAST SURGICAL HISTORY: Past Surgical History:  Procedure Laterality Date   BREAST REDUCTION SURGERY  2008   BREAST SURGERY     DILATION AND CURETTAGE OF UTERUS     FINGER SURGERY     Right index finger   TUBAL LIGATION  01/09/2013   Procedure: POST PARTUM TUBAL LIGATION;  Surgeon: Michael LitterNaima A Dillard, MD;  Location: [facility];  Service: Gynecology;  Laterality: Bilateral;  post partum tubal ligation bilateral   US ECHOCARDIOGRAPHY   06/18/2009   ef 55-60%   WISDOM TOOTH EXTRACTION      SOCIAL HISTORY: Social History   Tobacco Use   Smoking status: Passive Smoke Exposure - Never Smoker   Smokeless tobacco: Never Used  Substance Use Topics   Alcohol use: Yes    Comment: socially   Drug use: No    FAMILY HISTORY: Family History  Problem Relation Age of Onset   Hypertension Mother    Hyperlipidemia Mother    Coronary artery disease Mother    Heart disease Mother        high cholesterol   Asthma Mother    Diabetes Mother    Depression Mother    Anxiety disorder Mother    Sleep apnea Mother    Obesity Mother    Eating disorder Mother    Hypertension Father    Hypertension Maternal Grandmother  Hypertension Maternal Grandfather    Asthma Daughter    Alcohol abuse Maternal Uncle    Drug abuse Maternal Uncle    ROS: Review of Systems  Gastrointestinal: Negative for nausea and vomiting.  Musculoskeletal:       Negative for muscle weakness.   PHYSICAL EXAM: Pt in no acute distress  RECENT LABS AND TESTS: BMET    Component Value Date/Time   NA 137 12/17/2018 1522   K 3.9 12/17/2018 1522   CL 99 12/17/2018 1522   CO2 22 12/17/2018 1522   GLUCOSE 85 12/17/2018 1522   GLUCOSE 95 12/05/2017 0747   BUN 15 12/17/2018 1522   CREATININE 0.83 12/17/2018 1522   CREATININE 0.64 01/17/2013 1720   CALCIUM 9.6 12/17/2018 1522   GFRNONAA 90 12/17/2018 1522   GFRAA 103 12/17/2018 1522   Lab Results  Component Value Date   HGBA1C 5.2 09/08/2018   Lab Results  Component Value Date   INSULIN 11.5 09/08/2018   CBC    Component Value Date/Time   WBC 4.4 05/04/2019 1420   WBC 6.0 12/06/2017 0000   RBC 3.94 05/04/2019 1420   RBC 4.52 12/06/2017 0000   HGB 9.0 (L) 05/04/2019 1420   HCT 28.7 (L) 05/04/2019 1420   PLT 328 05/04/2019 1420   MCV 73 (L) 05/04/2019 1420   MCH 22.8 (L) 05/04/2019 1420   MCH 19.7 (L) 12/06/2017 0000   MCHC 31.4 (L) 05/04/2019 1420   MCHC 29.5 (L)  12/06/2017 0000   RDW 16.3 (H) 05/04/2019 1420   LYMPHSABS 1.5 05/04/2019 1420   MONOABS 0.3 12/04/2017 1858   EOSABS 0.1 05/04/2019 1420   BASOSABS 0.0 05/04/2019 1420   Iron/TIBC/Ferritin/ %Sat    Component Value Date/Time   IRON 44 05/04/2019 1420   TIBC 375 05/04/2019 1420   FERRITIN 5 (L) 05/04/2019 1420   IRONPCTSAT 12 (L) 05/04/2019 1420   Lipid Panel     Component Value Date/Time   CHOL 213 (H) 12/17/2018 1522   TRIG 87 12/17/2018 1522   HDL 71 12/17/2018 1522   CHOLHDL 3.0 12/17/2018 1522   LDLCALC 125 (H) 12/17/2018 1522   Hepatic Function Panel     Component Value Date/Time   PROT 7.7 09/08/2018 1042   ALBUMIN 4.5 09/08/2018 1042   AST 25 09/08/2018 1042   ALT 30 09/08/2018 1042   ALKPHOS 78 09/08/2018 1042   BILITOT 0.8 09/08/2018 1042      Component Value Date/Time   TSH 0.755 09/08/2018 1042   Results for Lowman, Kaori (MRN [id number]) as of 07/25/2019 15:11  Ref. Range 09/08/2018 10:42  Vitamin D, 25-Hydroxy Latest Ref Range: 30.0 - 100.0 ng/mL 9.6 (L)   I, Michaelene Song, am acting as Location manager for Dennard Nip, MD  I have reviewed the above documentation for accuracy and completeness, and I agree with the above. -Dennard Nip, MD

## 2019-08-08 ENCOUNTER — Encounter (INDEPENDENT_AMBULATORY_CARE_PROVIDER_SITE_OTHER): Payer: Self-pay | Admitting: Family Medicine

## 2019-08-08 ENCOUNTER — Other Ambulatory Visit: Payer: Self-pay

## 2019-08-08 ENCOUNTER — Telehealth (INDEPENDENT_AMBULATORY_CARE_PROVIDER_SITE_OTHER): Payer: 59 | Admitting: Family Medicine

## 2019-08-08 DIAGNOSIS — F3289 Other specified depressive episodes: Secondary | ICD-10-CM

## 2019-08-08 DIAGNOSIS — E559 Vitamin D deficiency, unspecified: Secondary | ICD-10-CM

## 2019-08-08 MED ORDER — VITAMIN D (ERGOCALCIFEROL) 1.25 MG (50000 UNIT) PO CAPS: 50000.0000 [IU] | ORAL_CAPSULE | ORAL | 0 refills | Status: DC

## 2019-08-08 MED ORDER — BUPROPION HCL ER (SR) 150 MG PO TB12: 150.0000 mg | ORAL_TABLET | Freq: Every day | ORAL | 0 refills | Status: DC

## 2019-08-08 NOTE — Progress Notes (Signed)
Office: [phone]  /  Fax: [phone] TeleHealth Visit:  Patricia Lin has verbally consented to this TeleHealth visit today. The patient is located at home, the provider is located at the [facility] office. The participants in this visit include the listed provider and patient. The visit was conducted today via face time.  HPI:   Chief Complaint: OBESITY Patricia Lin is here to discuss her progress with her obesity treatment plan. She is on the Pescatarian eating plan and is following her eating plan approximately 0 % of the time. She states she is walking for 30 minutes 1 time per week. Patricia Lin changed her visit to telehealth due to no childcare. She has been struggling with increased emotional eating and has a lot of stress in her life currently. She still wants to lose weight but she hasn't been able to follow her plan. She is trying to get back on track though. She states her weight was 194 lbs last week (down 5 lbs since last visit). We were unable to weigh the patient today for this TeleHealth visit. She feels as if she has lost weight since her last visit. She has lost 6 lbs since starting treatment with [place].  Vitamin D Deficiency Patricia Lin has a diagnosis of vitamin D deficiency. She is stable on prescription Vit D and denies nausea, vomiting or muscle weakness.  Depression with Emotional Eating Behaviors Patricia Lin is struggling with concentrating on her health. Her husband left and the kids and as expected her stress is very high. Patricia Lin struggles with emotional eating and using food for comfort to the extent that it is negatively impacting her health. She often snacks when she is not hungry. Patricia Lin sometimes feels she is out of control and then feels guilty that she made poor food choices. She has been working on behavior modification techniques to help reduce her emotional eating and has been somewhat successful. She shows no sign of suicidal or homicidal ideations.  Depression  screen [facility] 2/9 06/14/2019 05/03/2019 01/28/2019 12/17/2018 09/08/2018  Decreased Interest 0 3 0 2 1  Down, Depressed, Hopeless 0 3 0 0 3  PHQ - 2 Score 0 6 0 2 4  Altered sleeping - 3 - - 3  Tired, decreased energy - 3 - - 2  Change in appetite - 3 - - 2  Feeling bad or failure about yourself  - 1 - - 1  Trouble concentrating - 3 - - 1  Moving slowly or fidgety/restless - 0 - - 0  Suicidal thoughts - 0 - - 1  PHQ-9 Score - 19 - - 14  Difficult doing work/chores - Somewhat difficult - - Somewhat difficult    ASSESSMENT AND PLAN:  Vitamin D deficiency - Plan: Vitamin D, Ergocalciferol, (DRISDOL) 1.25 MG (50000 UT) CAPS capsule  Other depression - Plan: buPROPion (WELLBUTRIN SR) 150 MG 12 hr tablet  PLAN:  Vitamin D Deficiency [name] was informed that low vitamin D levels contributes to fatigue and are associated with obesity, breast, and colon cancer. [name] agrees to continue taking prescription Vit D 50,000 IU every week #4 and we will refill for 1 month. She will follow up for routine testing of vitamin D, at least 2-3 times per year. She was informed of the risk of over-replacement of vitamin D and agrees to not increase her dose unless she discusses this with [place] first. Patricia Lin agrees to follow up with our clinic in 2 weeks.  Depression with Emotional Eating Behaviors We discussed behavior  modification techniques today to help [name] deal with her emotional eating and depression. [name] agrees to start Wellbutrin SR 150 mg q AM #30 with no refills, and we will follow closely. [name] agrees to follow up with our clinic in 2 weeks.  Obesity [name] is currently in the action stage of change. As such, her goal is to continue with weight loss efforts She has agreed to portion control better and make smarter food choices, such as increase vegetables and decrease simple carbohydrates  [name] has been instructed to work up to a goal of 150 minutes of combined cardio and strengthening exercise per  week for weight loss and overall health benefits. We discussed the following Behavioral Modification Strategies today: increasing lean protein intake and increase H20 intake   [name] has agreed to follow up with our clinic in 2 weeks. She was informed of the importance of frequent follow up visits to maximize her success with intensive lifestyle modifications for her multiple health conditions.  ALLERGIES: Allergies  Allergen Reactions   Latex Itching and Rash    MEDICATIONS: Current Outpatient Medications on File Prior to Visit  Medication Sig Dispense Refill   ferrous sulfate 325 (65 FE) MG tablet Take 1 tablet (325 mg total) by mouth 3 (three) times daily with meals. 90 tablet 3   topiramate (TOPAMAX) 25 MG tablet Take 25 mg by mouth daily.     No current facility-administered medications on file prior to visit.     PAST MEDICAL HISTORY: Past Medical History:  Diagnosis Date   Abnormal Pap smear    Anemia    Anxiety    Back pain    BV (bacterial vaginosis) 2011   Chlamydia infection    CIN I (cervical intraepithelial neoplasia I) 2008   Depression    Dyspnea    Dysrhythmia 2009   TACHYCARDIA;   HAD W/U 2010? 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  B/P nl, and 122/76 on repeat-sitting   Postpartum depression 2011   NO MEDS   Pregnancy induced hypertension    ALL  PREGNANCIES   Shortness of breath    WITH EXERTION SICNE PREGNANCY   Tachycardia    Tachycardia 2010   HAS HAD WORKUP. UNSURE IF HAS POSSIBLE HEART VALVE ISSUE   Vulvitis 2009    PAST SURGICAL HISTORY: Past Surgical History:  Procedure Laterality Date   BREAST REDUCTION SURGERY  2008   BREAST SURGERY     DILATION AND CURETTAGE OF UTERUS     FINGER SURGERY     Right index finger   TUBAL LIGATION  01/09/2013   Procedure: POST PARTUM TUBAL LIGATION;  Surgeon: Michael Litter, MD;  Location: [facility];  Service: Gynecology;  Laterality: Bilateral;  post partum tubal  ligation bilateral   US ECHOCARDIOGRAPHY  06/18/2009   ef 55-60%   WISDOM TOOTH EXTRACTION      SOCIAL HISTORY: Social History   Tobacco Use   Smoking status: Passive Smoke Exposure - Never Smoker   Smokeless tobacco: Never Used  Substance Use Topics   Alcohol use: Yes    Comment: socially   Drug use: No    FAMILY HISTORY: Family History  Problem Relation Age of Onset   Hypertension Mother    Hyperlipidemia Mother    Coronary artery disease Mother    Heart disease Mother        high cholesterol   Asthma Mother    Diabetes Mother    Depression Mother    Anxiety disorder Mother    Sleep apnea Mother    Obesity Mother    Eating disorder Mother    Hypertension Father    Hypertension Maternal Grandmother    Hypertension Maternal Grandfather    Asthma Daughter    Alcohol abuse Maternal Uncle    Drug abuse Maternal Uncle     ROS: Review of Systems  Constitutional: Positive for weight loss.  Gastrointestinal: Negative for nausea and vomiting.  Musculoskeletal:       Negative muscle weakness  Psychiatric/Behavioral: Positive for depression. Negative for suicidal ideas.    PHYSICAL EXAM: Pt in no acute distress  RECENT LABS AND TESTS: BMET    Component Value Date/Time   NA 137 12/17/2018 1522   K 3.9 12/17/2018 1522   CL 99 12/17/2018 1522   CO2 22 12/17/2018 1522     GLUCOSE 85 12/17/2018 1522   GLUCOSE 95 12/05/2017 0747   BUN 15 12/17/2018 1522   CREATININE 0.83 12/17/2018 1522   CREATININE 0.64 01/17/2013 1720   CALCIUM 9.6 12/17/2018 1522   GFRNONAA 90 12/17/2018 1522   GFRAA 103 12/17/2018 1522   Lab Results  Component Value Date   HGBA1C 5.2 09/08/2018   Lab Results  Component Value Date   INSULIN 11.5 09/08/2018   CBC    Component Value Date/Time   WBC 4.4 05/04/2019 1420   WBC 6.0 12/06/2017 0000   RBC 3.94 05/04/2019 1420   RBC 4.52 12/06/2017 0000   HGB 9.0 (L) 05/04/2019 1420   HCT 28.7 (L) 05/04/2019 1420   PLT 328 05/04/2019 1420   MCV 73 (L) 05/04/2019 1420   MCH 22.8 (L) 05/04/2019 1420   MCH 19.7 (L) 12/06/2017 0000   MCHC 31.4 (L) 05/04/2019 1420   MCHC 29.5 (L) 12/06/2017 0000   RDW 16.3 (H) 05/04/2019 1420   LYMPHSABS 1.5 05/04/2019 1420   MONOABS 0.3 12/04/2017 1858   EOSABS 0.1 05/04/2019 1420   BASOSABS 0.0 05/04/2019 1420   Iron/TIBC/Ferritin/ %Sat    Component Value Date/Time   IRON 44 05/04/2019 1420   TIBC 375 05/04/2019 1420   FERRITIN 5 (L) 05/04/2019 1420   IRONPCTSAT 12 (L) 05/04/2019 1420   Lipid Panel     Component Value Date/Time   CHOL 213 (H) 12/17/2018 1522   TRIG 87 12/17/2018 1522   HDL 71 12/17/2018 1522   CHOLHDL 3.0 12/17/2018 1522   LDLCALC 125 (H) 12/17/2018 1522   Hepatic Function Panel     Component Value Date/Time   PROT 7.7 09/08/2018 1042   ALBUMIN 4.5 09/08/2018 1042   AST 25 09/08/2018 1042   ALT 30 09/08/2018 1042   ALKPHOS 78 09/08/2018 1042   BILITOT 0.8 09/08/2018 1042      Component Value Date/Time  TSH 0.755 09/08/2018 1042      I, Burt KnackSharon Martin, am acting as transcriptionist for Quillian Quincearen Kentrell Hallahan, MD I have reviewed the above documentation for accuracy and completeness, and I agree with the above. -Quillian Quincearen Zaharah Amir, MD

## 2019-08-17 ENCOUNTER — Ambulatory Visit (INDEPENDENT_AMBULATORY_CARE_PROVIDER_SITE_OTHER): Payer: 59 | Admitting: Psychiatry

## 2019-08-17 ENCOUNTER — Other Ambulatory Visit: Payer: Self-pay

## 2019-08-17 ENCOUNTER — Encounter (HOSPITAL_COMMUNITY): Payer: Self-pay | Admitting: Psychiatry

## 2019-08-17 DIAGNOSIS — F411 Generalized anxiety disorder: Secondary | ICD-10-CM | POA: Diagnosis not present

## 2019-08-17 DIAGNOSIS — F330 Major depressive disorder, recurrent, mild: Secondary | ICD-10-CM | POA: Diagnosis not present

## 2019-08-17 NOTE — Progress Notes (Signed)
Virtual Visit via Video Note  I connected with Sarahelizabeth Ledet on 08/17/19 at  2:00 PM EDT by a video enabled telemedicine application and verified that I am speaking with the correct person using two identifiers.  Location: Patient: Patricia Lin Provider: Lise Auer, LCSW   I discussed the limitations of evaluation and management by telemedicine and the availability of in person appointments. The patient expressed understanding and agreed to proceed.  History of Present Illness: MDD and GAD   Observations/Objective: Counselor met with Braelee for individual therapy via Webex. Counselor assessed MH symptoms and progress on treatment plan goals. Terease denied suicidal ideation or self-harm behaviors. Clarrissa shared that she is under immense stress related to her role as a mom-having 3 kids in virtual learning, being unable to work, related to marital problems and extended family issues. Counselor assessed her daily functioning and impact on her ability to self-care and communicate about needs. Alsie shared that she is in couples counseling with her husband weekly. She reported that they are at a turning point in their marriage and are living separate at this time. Counselor assessed her healthy supports and coping strategies. Christee identified a lack of support at this time. Counselor discussed increasing frequency of sessions for stabilization. Counselor offered other community resources. Rosamaria shared her thoughts and feelings related to her life stressors and her plan on dealing with them in a healthier manner. Counselor shared psychoeducation on marital conflicts and impact on children. Keyasha would like to continue work in these areas through journaling, eating healthy, taking medications and continuing in treatment.   Assessment and Plan: Counselor will continue to meet with patient to address treatment plan goals. Patient will continue to follow recommendations of providers and implement skills  learned in session.  Follow Up Instructions: Counselor will send information for next session via Webex.     I discussed the assessment and treatment plan with the patient. The patient was provided an opportunity to ask questions and all were answered. The patient agreed with the plan and demonstrated an understanding of the instructions.   The patient was advised to call back or seek an in-person evaluation if the symptoms worsen or if the condition fails to improve as anticipated.  I provided 60 minutes of non-face-to-face time during this encounter.   Lise Auer, LCSW

## 2019-08-18 ENCOUNTER — Encounter (HOSPITAL_COMMUNITY): Payer: Self-pay | Admitting: Psychiatry

## 2019-08-25 ENCOUNTER — Telehealth (HOSPITAL_COMMUNITY): Payer: Self-pay | Admitting: Psychiatry

## 2019-08-25 NOTE — Telephone Encounter (Signed)
D:  This morning, writer received a vm from a gentleman stating that pt needed to return to IOP/group.  He left a phone number that he wanted writer to call [phone]).  Writer placed call to the number, but there was no answer.  A:  Placed call to pt's number ([phone]) that's in Epic; according to pt, she has no idea who called on her behalf.  "I don't need to come back to group."  Will inform pt's therapist Network engineer).

## 2019-09-04 ENCOUNTER — Emergency Department (HOSPITAL_COMMUNITY)
Admission: EM | Admit: 2019-09-04 | Discharge: 2019-09-04 | Disposition: A | Discharge status: Home / Self Care | Payer: 59 | Attending: Emergency Medicine | Admitting: Emergency Medicine

## 2019-09-04 ENCOUNTER — Other Ambulatory Visit: Payer: Self-pay

## 2019-09-04 ENCOUNTER — Encounter (HOSPITAL_COMMUNITY): Payer: Self-pay | Admitting: *Deleted

## 2019-09-04 DIAGNOSIS — I10 Essential (primary) hypertension: Secondary | ICD-10-CM | POA: Insufficient documentation

## 2019-09-04 DIAGNOSIS — Z91040 Latex allergy status: Secondary | ICD-10-CM | POA: Diagnosis not present

## 2019-09-04 DIAGNOSIS — E860 Dehydration: Secondary | ICD-10-CM | POA: Diagnosis not present

## 2019-09-04 DIAGNOSIS — Z7722 Contact with and (suspected) exposure to environmental tobacco smoke (acute) (chronic): Secondary | ICD-10-CM | POA: Insufficient documentation

## 2019-09-04 DIAGNOSIS — Z79899 Other long term (current) drug therapy: Secondary | ICD-10-CM | POA: Diagnosis not present

## 2019-09-04 DIAGNOSIS — R42 Dizziness and giddiness: Secondary | ICD-10-CM | POA: Diagnosis present

## 2019-09-04 LAB — BASIC METABOLIC PANEL
Anion gap: 9 (ref 5–15)
BUN: 19 mg/dL (ref 6–20)
CO2: 25 mmol/L (ref 22–32)
Calcium: 9.6 mg/dL (ref 8.9–10.3)
Chloride: 105 mmol/L (ref 98–111)
Creatinine, Ser: 0.84 mg/dL (ref 0.44–1.00)
GFR calc Af Amer: >60 mL/min (ref >60)
GFR calc non Af Amer: >60 mL/min (ref >60)
Glucose, Bld: 115 mg/dL — ABNORMAL HIGH (ref 70–99)
Potassium: 3.6 mmol/L (ref 3.5–5.1)
Sodium: 139 mmol/L (ref 135–145)

## 2019-09-04 LAB — URINALYSIS, ROUTINE W REFLEX MICROSCOPIC
Bilirubin Urine: NEGATIVE
Glucose, UA: NEGATIVE mg/dL
Hgb urine dipstick: NEGATIVE
Ketones, ur: NEGATIVE mg/dL
Leukocytes,Ua: NEGATIVE
Nitrite: NEGATIVE
Protein, ur: NEGATIVE mg/dL
Specific Gravity, Urine: 1.026 (ref 1.005–1.030)
pH: 6 (ref 5.0–8.0)

## 2019-09-04 LAB — CBC
HCT: 37.6 % (ref 36.0–46.0)
Hemoglobin: 11.1 g/dL — ABNORMAL LOW (ref 12.0–15.0)
MCH: 23.8 pg — ABNORMAL LOW (ref 26.0–34.0)
MCHC: 29.5 g/dL — ABNORMAL LOW (ref 30.0–36.0)
MCV: 80.7 fL (ref 80.0–100.0)
Platelets: 305 10*3/uL (ref 150–400)
RBC: 4.66 MIL/uL (ref 3.87–5.11)
RDW: 21.1 % — ABNORMAL HIGH (ref 11.5–15.5)
WBC: 5.1 10*3/uL (ref 4.0–10.5)
nRBC: 0 % (ref 0.0–0.2)

## 2019-09-04 LAB — I-STAT BETA HCG BLOOD, ED (MC, WL, AP ONLY): I-stat hCG, quantitative: <5 m[IU]/mL (ref <5)

## 2019-09-04 MED ORDER — SODIUM CHLORIDE 0.9% FLUSH
3.0000 mL | Freq: Once | INTRAVENOUS | Status: AC
  Administered 2019-09-04: 3 mL via INTRAVENOUS

## 2019-09-04 MED ORDER — SODIUM CHLORIDE 0.9 % IV BOLUS
1000.0000 mL | Freq: Once | INTRAVENOUS | Status: AC
  Administered 2019-09-04: 1000 mL via INTRAVENOUS

## 2019-09-04 NOTE — Discharge Instructions (Signed)
Your labs in the emergency department today were reassuring.  Drink plenty of fluids to prevent dehydration.  Have your blood pressure rechecked by a primary care doctor as this was elevated in the ED.  You may return for any new or concerning symptoms.

## 2019-09-04 NOTE — ED Notes (Signed)
Patient states that she feels light headed when going from sitting to standing. Patient steady on feet when obtaining orthostatic vitals. Will continue to monitor patient.

## 2019-09-04 NOTE — ED Notes (Signed)
Discharge instructions reviewed with patient. Opportunity for questions and concerns allowed with none verbalized. Patient alert, stable, and ambulatory at discharge.

## 2019-09-04 NOTE — ED Triage Notes (Signed)
Pt reports she was lying down watching TV and she became lightheaded. Currently, feels like her heart is beating fast. Headache, says she feels dehydrated.

## 2019-09-05 NOTE — ED Provider Notes (Signed)
[facility]-EMERGENCY DEPT Provider Note   CSN: [id number] Arrival date & time: 09/04/19  0108     History   Chief Complaint Chief Complaint  Patient presents with  . Dizziness    HPI Patricia Lin is a 38 y.o. female.     38 y/o female with hx of anemia, anxiety, back pain, depression presents to the ED for evaluation of lightheadedness.  Patient states that she was lying down to watch TV when she became lightheaded.  She felt as though her heart was racing at this time.  Continues to report lightheadedness with change in position.  Denies any dizziness or the sensation of the room spinning.  No associated chest pain, shortness of breath, syncope.  Believes that she may be dehydrated; reporting decreased oral intake.  Denies sick contacts or URI symptoms.  The history is provided by the patient. No language interpreter was used.  Dizziness   Past Medical History:  Diagnosis Date  . Abnormal Pap smear   . Anemia   . Anxiety   . Back pain   . BV (bacterial vaginosis) 2011  . Chlamydia infection   . CIN I (cervical intraepithelial neoplasia I) 2008  . Depression   . Dyspnea   . Dysrhythmia 2009   TACHYCARDIA;   HAD W/U 2010? POSSIBLE HEART VALVE ISSUE; NOT F/U NEEDED X 3 YEARS PER PT  . Dysuria 2010  . Fatigue   . H/O pre-eclampsia in prior pregnancy, currently pregnant   . H/O varicella   . H/O vitamin D deficiency   . Headache(784.0)    MIGRAINES  . HTN (hypertension)   . Hx of breast reduction, elective   . Hx: UTI (urinary tract infection)   . Infection    UTI DURING PREGNANCY  . Infection    YEAST WITH PREGNANCY  . Infection    CHLAMYDIA X 1  . Knee pain   . 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 Low iron   . PIH (pregnancy induced hypertension) 11-2012   Today pp one month.  B/P nl, and 122/76 on repeat-sitting  . Postpartum depression 2011   NO MEDS  . Pregnancy induced  hypertension    ALL PREGNANCIES  . Shortness of breath    WITH EXERTION SICNE PREGNANCY  . Tachycardia   . Tachycardia 2010   HAS HAD WORKUP. UNSURE IF HAS POSSIBLE HEART VALVE ISSUE  . Vulvitis 2009    Patient Active Problem List   Diagnosis Date Noted  . Right lower quadrant abdominal pain 01/28/2019  . Generalized headaches 12/17/2018  . Anxiety 12/17/2018  . Anemia 12/05/2017  . S/P tubal ligation 02/09/2013  . Vaginal delivery 01/09/2013  . 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  . Fracture of left ulna 09/27/12 10/23/2012  . 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 Symptomatic anemia 09/03/2012  . H/O pre-eclampsia in prior pregnancy, currently pregnant 08/19/2012  . Chronic hypertension in pregnancy 08/19/2012  . Hx LEEP (loop electrosurgical excision procedure), cervix, pregnancy 08/19/2012  . Hx of postpartum depression, currently pregnant 08/19/2012  . Late prenatal care 08/19/2012  . Hx of precipitous labor and deliveries, antepartum 08/19/2012  . Muscle tension HAs 08/19/2012    Past Surgical History:  Procedure Laterality Date  . BREAST REDUCTION SURGERY  2008  . BREAST SURGERY    . DILATION AND CURETTAGE OF UTERUS    . FINGER SURGERY     Right index finger  .  TUBAL LIGATION  01/09/2013   Procedure: POST PARTUM TUBAL LIGATION;  Surgeon: Michael LitterNaima A Dillard, MD;  Location: [facility];  Service: Gynecology;  Laterality: Bilateral;  post partum tubal ligation bilateral  . US ECHOCARDIOGRAPHY  06/18/2009   ef 55-60%  . WISDOM TOOTH EXTRACTION       OB History    Gravida  7   Para  4   Term  4   Preterm      AB  3   Living  4     SAB  1   TAB  2   Ectopic      Multiple      Live Births  4        Obstetric Comments  2011 PRECLAMPSIA PP; IN ICU X 3 DAYS         Home Medications    Prior to Admission medications   Medication Sig Start Date End Date Taking? Authorizing Provider  buPROPion (WELLBUTRIN SR) 150 MG 12 hr tablet Take  1 tablet (150 mg total) by mouth daily. 08/08/19  Yes Quillian QuinceBeasley, Caren D, MD  ferrous sulfate 325 (65 FE) MG tablet Take 1 tablet (325 mg total) by mouth 3 (three) times daily with meals. Patient taking differently: Take 325 mg by mouth daily.  05/16/19  Yes Arnette FeltsMoore, Janece, FNP  ibuprofen (ADVIL) 200 MG tablet Take 400 mg by mouth every 6 (six) hours as needed for moderate pain.   Yes [provider]  Vitamin D, Ergocalciferol, (DRISDOL) 1.25 MG (50000 UT) CAPS capsule Take 1 capsule (50,000 Units total) by mouth every 7 (seven) days. Patient taking differently: Take 50,000 Units by mouth every Saturday.  08/08/19  Yes Quillian QuinceBeasley, Caren D, MD    Family History Family History  Problem Relation Age of Onset  . Hypertension Mother   . Hyperlipidemia Mother   . Coronary artery disease Mother   . Heart disease Mother        high cholesterol  . Asthma Mother   . Diabetes Mother   . Depression Mother   . Anxiety disorder Mother   . Sleep apnea Mother   . Obesity Mother   . Eating disorder Mother   . Hypertension Father   . Hypertension Maternal Grandmother   . Hypertension Maternal Grandfather   . Asthma Daughter   . Alcohol abuse Maternal Uncle   . Drug abuse Maternal Uncle     Social History Social History   Tobacco Use  . Smoking status: Passive Smoke Exposure - Never Smoker  . Smokeless tobacco: Never Used  Substance Use Topics  . Alcohol use: Yes    Comment: socially  . Drug use: No     Allergies   Latex   Review of Systems Review of Systems  Neurological: Positive for dizziness.  Ten systems reviewed and are negative for acute change, except as noted in the HPI.    Physical Exam Updated Vital Signs BP (!) 136/93 (BP Location: Left Arm)   Pulse 86   Temp 97.9 F (36.6 C)   Resp 18   Ht 5\' 2"  (1.575 m)   Wt 86.2 kg   LMP 08/21/2019   SpO2 98%   BMI 34.75 kg/m   Physical Exam Vitals signs and nursing note reviewed.  Constitutional:      General: She  is not in acute distress.    Appearance: She is well-developed. She is not diaphoretic.     Comments: Nontoxic appearing and in NAD  HENT:  Head: Normocephalic and atraumatic.  Eyes:     General: No scleral icterus.    Conjunctiva/sclera: Conjunctivae normal.  Neck:     Musculoskeletal: Normal range of motion.  Cardiovascular:     Rate and Rhythm: Normal rate and regular rhythm.     Pulses: Normal pulses.  Pulmonary:     Effort: Pulmonary effort is normal. No respiratory distress.     Breath sounds: No stridor. No wheezing.     Comments: Respirations even and unlabored Musculoskeletal: Normal range of motion.  Skin:    General: Skin is warm and dry.     Coloration: Skin is not pale.     Findings: No erythema or rash.  Neurological:     General: No focal deficit present.     Mental Status: She is alert and oriented to person, place, and time.     Coordination: Coordination normal.  Psychiatric:        Behavior: Behavior normal.      ED Treatments / Results  Labs (all labs ordered are listed, but only abnormal results are displayed) Labs Reviewed  BASIC METABOLIC PANEL - Abnormal; Notable for the following components:      Result Value   Glucose, Bld 115 (*)    All other components within normal limits  CBC - Abnormal; Notable for the following components:   Hemoglobin 11.1 (*)    MCH 23.8 (*)    MCHC 29.5 (*)    RDW 21.1 (*)    All other components within normal limits  URINALYSIS, ROUTINE W REFLEX MICROSCOPIC  I-STAT BETA HCG BLOOD, ED ([facility], [facility], [facility] ONLY)    EKG EKG Interpretation  Date/Time:  Sunday September 04 2019 01:19:22 EDT Ventricular Rate:  85 PR Interval:    QRS Duration: 88 QT Interval:  378 QTC Calculation: 450 R Axis:   35 Text Interpretation:  Sinus rhythm Confirmed by Palumbo, April ([id number]) on 09/04/2019 3:19:21 AM   Radiology No results found.  Procedures Procedures (including critical care time)  Medications Ordered in ED  Medications  sodium chloride flush (NS) 0.9 % injection 3 mL (3 mLs Intravenous Given 09/04/19 0324)  sodium chloride 0.9 % bolus 1,000 mL (0 mLs Intravenous Stopped 09/04/19 0432)    5:30 AM Patient reassessed. Resting comfortably. States she feels better. Assisted the patient to a standing position. She states that she is no longer feeling lightheaded with position change.   Initial Impression / Assessment and Plan / ED Course  I have reviewed the triage vital signs and the nursing notes.  Pertinent labs & imaging results that were available during my care of the patient were reviewed by me and considered in my medical decision making (see chart for details).        38  year old female presents to the emergency department for lightheadedness and palpitations which began this evening.  Her symptoms have significantly improved with IV fluids.  Laboratory evaluation reassuring and orthostatic vital signs stable, though patient initially complained of lightheadedness upon standing with orthostatic testing.  This sensation has subsided on repeat assessment.  Patient is otherwise well-appearing, nontoxic.  No associated chest pain, shortness of breath, syncope.  Encouraged continued oral fluid hydration with primary care follow-up.  Return precautions discussed and provided.  Patient discharged in stable condition with no unaddressed concerns.   Final Clinical Impressions(s) / ED Diagnoses   Final diagnoses:  Dehydration  Hypertension not at goal    ED Discharge Orders    None  Antony Madura, PA-C 09/05/19 0174    Palumbo, April, MD 09/11/19 2348

## 2019-09-08 ENCOUNTER — Encounter: Payer: Self-pay | Admitting: Nurse Practitioner

## 2019-09-08 ENCOUNTER — Other Ambulatory Visit: Payer: Self-pay

## 2019-09-08 ENCOUNTER — Telehealth (INDEPENDENT_AMBULATORY_CARE_PROVIDER_SITE_OTHER): Payer: 59 | Admitting: Nurse Practitioner

## 2019-09-08 VITALS — BP 130/92 | HR 71 | Temp 97.9°F | Wt 195.2 lb

## 2019-09-08 DIAGNOSIS — D509 Iron deficiency anemia, unspecified: Secondary | ICD-10-CM

## 2019-09-08 DIAGNOSIS — I10 Essential (primary) hypertension: Secondary | ICD-10-CM | POA: Diagnosis not present

## 2019-09-08 DIAGNOSIS — R42 Dizziness and giddiness: Secondary | ICD-10-CM | POA: Diagnosis not present

## 2019-09-08 DIAGNOSIS — Z139 Encounter for screening, unspecified: Secondary | ICD-10-CM

## 2019-09-08 DIAGNOSIS — Z09 Encounter for follow-up examination after completed treatment for conditions other than malignant neoplasm: Secondary | ICD-10-CM

## 2019-09-08 NOTE — Progress Notes (Signed)
Virtual Visit via Video   This visit type was conducted due to national recommendations for restrictions regarding the COVID-19 Pandemic (e.g. social distancing) in an effort to limit this patient's exposure and mitigate transmission in our community.  Due to her co-morbid illnesses, this patient is at least at moderate risk for complications without adequate follow up.  This format is felt to be most appropriate for this patient at this time.  All issues noted in this document were discussed and addressed.  A limited physical exam was performed with this format.    This visit type was conducted due to national recommendations for restrictions regarding the COVID-19 Pandemic (e.g. social distancing) in an effort to limit this patient's exposure and mitigate transmission in our community.  Patients identity confirmed using two different identifiers.  This format is felt to be most appropriate for this patient at this time.  All issues noted in this document were discussed and addressed.  No physical exam was performed (except for noted visual exam findings with Video Visits).    Date:  09/18/2019   ID:  Patricia Lin, DOB [birth year], MRN [id number]  Patient Location:  Home - spoke with [place]  Provider location:   Office    Chief Complaint:  Follow up ER visit for dizziness  History of Present Illness:    Patricia Lin is a 38 y.o. female who presents via video conferencing for a telehealth visit today.    The patient does not have symptoms concerning for COVID-19 infection (fever, chills, cough, or new shortness of breath).   She was seen at the ER on 9/27 for dizziness and dehydration.  Dr. [facility] psychiatrist report her hemoglobin was 10.  She continues to feel tired and fatigued. She did not go to the GYN.  She is also son buproprion for her weight loss.      Past Medical History:  Diagnosis Date   Abnormal Pap smear    Anemia    Anxiety    Back pain    BV (bacterial  vaginosis) 2011   Chlamydia infection    CIN I (cervical intraepithelial neoplasia I) 2008   Depression    Dyspnea    Dysrhythmia 2009   TACHYCARDIA;   HAD W/U 2010? 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  B/P nl, and 122/76 on repeat-sitting   Postpartum depression 2011   NO MEDS   Pregnancy induced hypertension    ALL PREGNANCIES   Shortness of breath    WITH EXERTION SICNE PREGNANCY   Tachycardia    Tachycardia 2010   HAS HAD WORKUP. UNSURE IF HAS POSSIBLE HEART VALVE ISSUE   Vulvitis 2009   Past Surgical History:  Procedure Laterality Date   BREAST REDUCTION SURGERY  2008   BREAST SURGERY     DILATION AND CURETTAGE OF UTERUS     FINGER SURGERY     Right index finger   TUBAL LIGATION  01/09/2013   Procedure: POST PARTUM TUBAL LIGATION;  Surgeon: Patricia LitterNaima A Dillard, MD;  Location: [facility];  Service: Gynecology;  Laterality: Bilateral;  post partum tubal ligation bilateral   US ECHOCARDIOGRAPHY  06/18/2009   ef 55-60%   WISDOM TOOTH EXTRACTION       Current Meds  Medication Sig   buPROPion (WELLBUTRIN SR) 150 MG 12 hr tablet Take 1 tablet (150 mg total) by mouth daily.   ferrous sulfate 325 (65 FE) MG tablet Take 1 tablet (325 mg total) by mouth 3 (three) times daily with meals. (Patient taking differently: Take 325 mg by mouth daily. )   Vitamin D, Ergocalciferol,  (DRISDOL) 1.25 MG (50000 UT) CAPS capsule Take 1 capsule (50,000 Units total) by mouth every 7 (seven) days. (Patient taking differently: Take 50,000 Units by mouth every Saturday. )     Allergies:   Latex   Social History   Tobacco Use   Smoking status: Passive Smoke Exposure - Never Smoker   Smokeless tobacco: Never Used  Substance Use Topics   Alcohol use: Yes    Comment: socially   Drug use: No     Family Hx: The patient's family history includes Alcohol abuse in her maternal uncle; Anxiety disorder in her mother; Asthma in her daughter and mother; Coronary artery disease in her mother; Depression in her mother; Diabetes in her mother; Drug abuse in her maternal uncle; Eating disorder in her mother; Heart disease in her mother; Hyperlipidemia in her mother; Hypertension in her father, maternal grandfather, maternal grandmother, and mother; Obesity in her mother; Sleep apnea in her mother.  ROS:   Please see the history of present illness.    Review of Systems  Constitutional: Positive for malaise/fatigue.  Cardiovascular: Negative.   Neurological: Positive for dizziness (has improved).  Psychiatric/Behavioral: Negative.     All other systems reviewed and are negative.   Labs/Other Tests and Data Reviewed:    Recent Labs: 09/04/2019: BUN 19; Creatinine, Ser 0.84; Hemoglobin 11.1; Platelets 305; Potassium 3.6; Sodium 139   Recent Lipid Panel Lab Results  Component Value Date/Time   CHOL 213 (H) 12/17/2018 03:22 PM   TRIG 87 12/17/2018 03:22 PM   HDL 71 12/17/2018 03:22 PM   CHOLHDL 3.0 12/17/2018 03:22 PM   LDLCALC 125 (H) 12/17/2018 03:22 PM    Wt Readings from Last 3 Encounters:  09/08/19 195 lb 3.2 oz (88.5 kg)  09/04/19 190 lb (86.2 kg)  06/14/19 198 lb 6.4 oz (90 kg)     Exam:    Vital Signs:  BP (!) 130/92 (BP Location: Left Arm, Patient Position: Sitting, Cuff Size: Small)    Pulse 71    Temp 97.9 F (36.6 C) (Oral)    Wt 195 lb 3.2 oz (88.5 kg)     LMP 08/21/2019    BMI 35.70 kg/m     Physical Exam  Constitutional: She is oriented to person, place, and time and well-developed, well-nourished, and in no distress.  Pulmonary/Chest: Effort normal.  Neurological: She is alert and oriented to person, place, and time.  Psychiatric: Mood, memory, affect and judgment normal.    ASSESSMENT & PLAN:    1. Iron deficiency anemia, unspecified iron deficiency anemia type  I will check her iron studies again since she has a history of iron  deficiency anemia  She is to continue with her iron supplement - Iron, TIBC and Ferritin Panel  2. Encounter for screening  - HIV antibody (with reflex)  3. Dizziness  She admits to her dizziness improving.    I have encouraged her to be sure to stay well hydrated.  4. Elevated blood pressure reading in office with diagnosis of hypertension  Her blood pressure was slightly elevated at the ER  She does not have a blood pressure cuff at home  She is advised to avoid high salt foods   COVID-19 Education: The signs and symptoms of COVID-19 were discussed with the patient and how to seek care for testing (follow up with PCP or arrange E-visit).  The importance of social distancing was discussed today.  Patient Risk:   After full review of this patients clinical status, I feel that they are at least moderate risk at this time.  Time:   Today, I have spent 12 minutes/ seconds with the patient with telehealth technology discussing above diagnoses.     Medication Adjustments/Labs and Tests Ordered: Current medicines are reviewed at length with the patient today.  Concerns regarding medicines are outlined above.   Tests Ordered: Orders Placed This Encounter  Procedures   Iron, TIBC and Ferritin Panel   HIV antibody (with reflex)    Medication Changes: No orders of the defined types were placed in this encounter.   Disposition:  Follow up prn  Signed, Arnette Felts, FNP

## 2019-09-09 LAB — IRON,TIBC AND FERRITIN PANEL
Ferritin: 17 ng/mL (ref 15–150)
Iron Saturation: 47 % (ref 15–55)
Iron: 151 ug/dL (ref 27–159)
Total Iron Binding Capacity: 324 ug/dL (ref 250–450)
UIBC: 173 ug/dL (ref 131–425)

## 2019-09-09 LAB — HIV ANTIBODY (ROUTINE TESTING W REFLEX): HIV Screen 4th Generation wRfx: NONREACTIVE

## 2019-09-15 ENCOUNTER — Ambulatory Visit (INDEPENDENT_AMBULATORY_CARE_PROVIDER_SITE_OTHER): Payer: 59 | Admitting: Psychiatry

## 2019-09-15 ENCOUNTER — Other Ambulatory Visit: Payer: Self-pay

## 2019-09-15 DIAGNOSIS — F411 Generalized anxiety disorder: Secondary | ICD-10-CM

## 2019-09-15 DIAGNOSIS — F330 Major depressive disorder, recurrent, mild: Secondary | ICD-10-CM | POA: Diagnosis not present

## 2019-09-15 NOTE — Progress Notes (Signed)
Virtual Visit via Video Note  I connected with Patricia Lin on 09/15/19 at  3:00 PM EDT by a video enabled telemedicine application and verified that I am speaking with the correct person using two identifiers.  Location: Patient: Patricia Lin Provider: Lise Auer, LCSW   I discussed the limitations of evaluation and management by telemedicine and the availability of in person appointments. The patient expressed understanding and agreed to proceed.  History of Present Illness: MDD and GAD   Observations/Objective: Counselor met with Patricia Lin for individual therapy via Webex. Counselor assessed MH symptoms and progress on treatment plan goals. Her depression is moderate and anxiety moderate today. Patricia Lin denied suicidal ideation or self-harm behaviors. Patricia Lin shared that she is dealing with dehydration, without a desire to eat or drink due to current medications. Counselor and Patricia Lin discussed medications and self-care. Patricia Lin endorsed that she is not putting her and her needs first at all. 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 Counselor provided coping strategies and ways to implement self-care, prioritize health, etc, so she will be more able to engage in her parental roles. Patricia Lin identified coping skills as well. Counselor and Patricia Lin processed thoughts and feelings regarding her current marital and professional situations. Patricia Lin is hopeful about her future and has a 6 mo to year plan in place to better her situation. Counselor praised and encouraged her in these efforts of attempting to regain control of her life and situation. The session was cut short due to a school need of one of her children. We will continue to meet on a EOW basis.   Assessment and Plan: Counselor will continue to meet with patient to address treatment plan goals. Patient will continue to follow recommendations of providers and implement  skills learned in session.  Follow Up Instructions: Counselor will send information for next session via Webex.     I discussed the assessment and treatment plan with the patient. The patient was provided an opportunity to ask questions and all were answered. The patient agreed with the plan and demonstrated an understanding of the instructions.   The patient was advised to call back or seek an in-person evaluation if the symptoms worsen or if the condition fails to improve as anticipated.  I provided 40 minutes of non-face-to-face time during this encounter.   Lise Auer, LCSW

## 2019-09-16 ENCOUNTER — Other Ambulatory Visit (INDEPENDENT_AMBULATORY_CARE_PROVIDER_SITE_OTHER): Payer: Self-pay | Admitting: Family Medicine

## 2019-09-16 DIAGNOSIS — E559 Vitamin D deficiency, unspecified: Secondary | ICD-10-CM

## 2019-09-17 ENCOUNTER — Encounter (HOSPITAL_COMMUNITY): Payer: Self-pay | Admitting: Psychiatry

## 2019-09-18 ENCOUNTER — Encounter: Payer: Self-pay | Admitting: Nurse Practitioner

## 2019-09-26 ENCOUNTER — Other Ambulatory Visit (INDEPENDENT_AMBULATORY_CARE_PROVIDER_SITE_OTHER): Payer: Self-pay | Admitting: Family Medicine

## 2019-09-26 DIAGNOSIS — F3289 Other specified depressive episodes: Secondary | ICD-10-CM

## 2019-09-28 ENCOUNTER — Encounter (INDEPENDENT_AMBULATORY_CARE_PROVIDER_SITE_OTHER): Payer: Self-pay | Admitting: Family Medicine

## 2019-09-29 ENCOUNTER — Other Ambulatory Visit: Payer: Self-pay

## 2019-09-29 ENCOUNTER — Encounter (HOSPITAL_COMMUNITY): Payer: Self-pay | Admitting: Psychiatry

## 2019-09-29 ENCOUNTER — Ambulatory Visit (INDEPENDENT_AMBULATORY_CARE_PROVIDER_SITE_OTHER): Payer: 59 | Admitting: Psychiatry

## 2019-09-29 DIAGNOSIS — F330 Major depressive disorder, recurrent, mild: Secondary | ICD-10-CM | POA: Diagnosis not present

## 2019-09-29 DIAGNOSIS — F411 Generalized anxiety disorder: Secondary | ICD-10-CM

## 2019-09-29 NOTE — Progress Notes (Signed)
Virtual Visit via Video Note  I connected with [employer] on 09/29/19 at  3:00 PM EDT by a video enabled telemedicine application and verified that I am speaking with the correct person using two identifiers.  Location: Patient: Patricia Lin Provider: Lise Auer, LCSW   I discussed the limitations of evaluation and management by telemedicine and the availability of in person appointments. The patient expressed understanding and agreed to proceed.  History of Present Illness: MDD   Observations/Objective: Counselor met with Lee-Ann for individual therapy via Webex. Counselor assessed MH symptoms and progress on treatment plan goals. Xia presented with moderate depression and mild anxiety. Vyctoria denied suicidal ideation or self-harm behaviors. Justyna shared that she identified that she is experiencing a grief episode related to the 2nd anniversary of her grandfathers passing. She noted that he was more life a father to her than a grandfather. Counselor shared psychoeducation on how grief presents itself over time and we explored what stage of grief she was experiencing. Ryin shared about a variety of triggers she has been experiencing over the past month and how she is processing and coping with the loss reminders. Counselor shared information on grief and loss support groups. Keyarah was very interested in attending groups virtually. Counselor assessed daily functioning. Fizza expressed difficulties with self-care, hygiene, regular food intake, sleep disturbances, medication issues, and increased stress/maritial conflicts impacting functioning. Counselor discussed follow up with providers as well as implementation of coping strategies, communication and boundary setting. Latausha was able to identify some coping strategies she could implement to address functioning and stress levels.   Assessment and Plan: Counselor will continue to meet with patient to address treatment plan goals. Patient  will continue to follow recommendations of providers and implement skills learned in session.  Follow Up Instructions: Counselor will send information for next session via Webex.      I discussed the assessment and treatment plan with the patient. The patient was provided an opportunity to ask questions and all were answered. The patient agreed with the plan and demonstrated an understanding of the instructions.   The patient was advised to call back or seek an in-person evaluation if the symptoms worsen or if the condition fails to improve as anticipated.  I provided 55 minutes of non-face-to-face time during this encounter.   Lise Auer, LCSW

## 2019-09-30 ENCOUNTER — Other Ambulatory Visit (INDEPENDENT_AMBULATORY_CARE_PROVIDER_SITE_OTHER): Payer: Self-pay | Admitting: Family Medicine

## 2019-09-30 DIAGNOSIS — E559 Vitamin D deficiency, unspecified: Secondary | ICD-10-CM

## 2019-09-30 DIAGNOSIS — F3289 Other specified depressive episodes: Secondary | ICD-10-CM

## 2019-10-05 ENCOUNTER — Telehealth (INDEPENDENT_AMBULATORY_CARE_PROVIDER_SITE_OTHER): Payer: 59 | Admitting: Family Medicine

## 2019-10-05 ENCOUNTER — Other Ambulatory Visit: Payer: Self-pay

## 2019-10-05 ENCOUNTER — Encounter (INDEPENDENT_AMBULATORY_CARE_PROVIDER_SITE_OTHER): Payer: Self-pay | Admitting: Family Medicine

## 2019-10-05 DIAGNOSIS — Z6833 Body mass index (BMI) 33.0-33.9, adult: Secondary | ICD-10-CM

## 2019-10-05 DIAGNOSIS — F418 Other specified anxiety disorders: Secondary | ICD-10-CM | POA: Diagnosis not present

## 2019-10-05 DIAGNOSIS — E559 Vitamin D deficiency, unspecified: Secondary | ICD-10-CM | POA: Diagnosis not present

## 2019-10-05 DIAGNOSIS — E669 Obesity, unspecified: Secondary | ICD-10-CM

## 2019-10-05 MED ORDER — VITAMIN D (ERGOCALCIFEROL) 1.25 MG (50000 UNIT) PO CAPS: 50000.0000 [IU] | ORAL_CAPSULE | ORAL | 0 refills | Status: DC

## 2019-10-05 MED ORDER — BUPROPION HCL ER (SR) 100 MG PO TB12: 100.0000 mg | ORAL_TABLET | Freq: Every day | ORAL | 0 refills | Status: DC

## 2019-10-06 NOTE — Progress Notes (Signed)
Office: [phone]  /  Fax: [phone] TeleHealth Visit:  Junius Finnerlyssa Quesenberry has verbally consented to this TeleHealth visit today. The patient is located at home, the provider is located at the [facility] office. The participants in this visit include the listed provider and patient. The visit was conducted today via face time.  HPI:   Chief Complaint: OBESITY Malli is here to discuss her progress with her obesity treatment plan. She is on the portion control better and make smarter food choices, such as increase vegetables and decrease simple carbohydrates and is following her eating plan approximately 0 % of the time. She states she is doing crunches and jumping jacks for 30 minutes 4 times per week. Audreanna's last visit was 2 months ago. She has not been following an eating plan, but has been seeing a therapist to work on her depression and anxiety which had been worsening. Her weight was 196 lbs this morning which is close to the same as her last visit.  We were unable to weigh the patient today for this TeleHealth visit. She feels as if she has gained 2 lbs since her last visit. She has lost 6 lbs since starting treatment with us.  Vitamin D Deficiency Dura has a diagnosis of vitamin D deficiency. She is stable on prescription Vit D and denies nausea, vomiting or muscle weakness.  Depression with Anxiety Nariyah feels her Wellbutrin is helping her mood, but it is decreasing her appetite enough that she is skipping meals. She doesn't want to stop it because she feels it is still helping. She shows no sign of suicidal or homicidal ideations.  ASSESSMENT AND PLAN:  Class 1 obesity with serious comorbidity and body mass index (BMI) of 33.0 to 33.9 in adult, unspecified obesity type  Depression with anxiety - Plan: buPROPion (WELLBUTRIN SR) 100 MG 12 hr tablet  Vitamin D deficiency - Plan: Vitamin D, Ergocalciferol, (DRISDOL) 1.25 MG (50000 UT) CAPS capsule  PLAN:   Vitamin D Deficiency Laporchia was informed that low vitamin D levels contributes to fatigue and are associated with obesity, breast, and colon cancer. Galileah agrees to continue taking prescription Vit D 50,000 IU every week #4 and we will refill for 1 month. She will follow up for routine testing of vitamin D, at least 2-3 times per year. She was informed of the risk of over-replacement of vitamin D and agrees to not increase her dose unless she discusses this with us first. We will recheck labs at her next in office visit. Ulah agrees to follow up with our clinic in 2 weeks.  Depression with Emotional Eating Behaviors We discussed behavior modification techniques today to help Latonia deal with her depression and anxiety. Clydean agrees to decrease Wellbutrin SR to 100 mg PO q AM #30 with no refills. Alieah agrees to follow up with our clinic in 2 weeks.  Obesity Maven is currently in the action stage of change. As such, her goal is to continue with weight loss efforts She has agreed to portion control better and make smarter food choices, such as increase vegetables and decrease simple carbohydrates  Louetta has been instructed to work up to a goal of 150 minutes of combined cardio and strengthening exercise per week for weight loss and overall health benefits. We discussed the following Behavioral Modification Strategies today: increasing lean protein intake and no skipping meals   Shamicka has agreed to follow up with our clinic in 2 weeks. She was informed of the importance  of frequent follow up visits to maximize her success with intensive lifestyle modifications for her multiple health conditions.  ALLERGIES: Allergies  Allergen Reactions  . Latex Itching and Rash    MEDICATIONS: Current Outpatient Medications on File Prior to Visit  Medication Sig Dispense Refill  . ferrous sulfate 325 (65 FE) MG tablet Take 1 tablet (325 mg total) by mouth 3 (three) times daily with meals. (Patient  taking differently: Take 325 mg by mouth daily. ) 90 tablet 3  . ibuprofen (ADVIL) 200 MG tablet Take 400 mg by mouth every 6 (six) hours as needed for moderate pain.     No current facility-administered medications on file prior to visit.     PAST MEDICAL HISTORY: Past Medical History:  Diagnosis Date  . Abnormal Pap smear   . Anemia   . Anxiety   . Back pain   . BV (bacterial vaginosis) 2011  . Chlamydia infection   . CIN I (cervical intraepithelial neoplasia I) 2008  . Depression   . Dyspnea   . Dysrhythmia 2009   TACHYCARDIA;   HAD W/U 2010? POSSIBLE HEART VALVE ISSUE; NOT F/U NEEDED X 3 YEARS PER PT  . Dysuria 2010  . Fatigue   . H/O pre-eclampsia in prior pregnancy, currently pregnant   . H/O varicella   . H/O vitamin D deficiency   . Headache(784.0)    MIGRAINES  . HTN (hypertension)   . Hx of breast reduction, elective   . Hx: UTI (urinary tract infection)   . Infection    UTI DURING PREGNANCY  . Infection    YEAST WITH PREGNANCY  . Infection    CHLAMYDIA X 1  . Knee pain   . 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 Low iron   . PIH (pregnancy induced hypertension) 11-2012   Today pp one month.  B/P nl, and 122/76 on repeat-sitting  . Postpartum depression 2011   NO MEDS  . Pregnancy induced hypertension    ALL PREGNANCIES  . Shortness of breath    WITH EXERTION SICNE PREGNANCY  . Tachycardia   . Tachycardia 2010   HAS HAD WORKUP. UNSURE IF HAS POSSIBLE HEART VALVE ISSUE  . Vulvitis 2009    PAST SURGICAL HISTORY: Past Surgical History:  Procedure Laterality Date  . BREAST REDUCTION SURGERY  2008  . BREAST SURGERY    . DILATION AND CURETTAGE OF UTERUS    . FINGER SURGERY     Right index finger  . TUBAL LIGATION  01/09/2013   Procedure: POST PARTUM TUBAL LIGATION;  Surgeon: Michael Litter, MD;  Location: [facility];  Service: Gynecology;  Laterality: Bilateral;  post partum tubal ligation bilateral   . US ECHOCARDIOGRAPHY  06/18/2009   ef 55-60%  . WISDOM TOOTH EXTRACTION      SOCIAL HISTORY: Social History   Tobacco Use  . Smoking status: Passive Smoke Exposure - Never Smoker  . Smokeless tobacco: Never Used  Substance Use Topics  . Alcohol use: Yes    Comment: socially  . Drug use: No    FAMILY HISTORY: Family History  Problem Relation Age of Onset  . Hypertension Mother   . Hyperlipidemia Mother   . Coronary artery disease Mother   . Heart disease Mother        high cholesterol  . Asthma Mother   . Diabetes Mother   . Depression Mother   . Anxiety disorder Mother   .  Sleep apnea Mother   . Obesity Mother   . Eating disorder Mother   . Hypertension Father   . Hypertension Maternal Grandmother   . Hypertension Maternal Grandfather   . Asthma Daughter   . Alcohol abuse Maternal Uncle   . Drug abuse Maternal Uncle     ROS: Review of Systems  Constitutional: Negative for weight loss.  Gastrointestinal: Negative for nausea and vomiting.  Musculoskeletal:       Negative muscle weakness  Psychiatric/Behavioral: Positive for depression. Negative for suicidal ideas.       + Anxiety    PHYSICAL EXAM: Pt in no acute distress  RECENT LABS AND TESTS: BMET    Component Value Date/Time   NA 139 09/04/2019 0130   NA 137 12/17/2018 1522   K 3.6 09/04/2019 0130   CL 105 09/04/2019 0130   CO2 25 09/04/2019 0130   GLUCOSE 115 (H) 09/04/2019 0130   BUN 19 09/04/2019 0130   BUN 15 12/17/2018 1522   CREATININE 0.84 09/04/2019 0130   CREATININE 0.64 01/17/2013 1720   CALCIUM 9.6 09/04/2019 0130   GFRNONAA >60 09/04/2019 0130   GFRAA >60 09/04/2019 0130   Lab Results  Component Value Date   HGBA1C 5.2 09/08/2018   Lab Results  Component Value Date   INSULIN 11.5 09/08/2018   CBC    Component Value Date/Time   WBC 5.1 09/04/2019 0130   RBC 4.66 09/04/2019 0130   HGB 11.1 (L) 09/04/2019 0130   HGB 9.0 (L) 05/04/2019 1420   HCT 37.6 09/04/2019 0130    HCT 28.7 (L) 05/04/2019 1420   PLT 305 09/04/2019 0130   PLT 328 05/04/2019 1420   MCV 80.7 09/04/2019 0130   MCV 73 (L) 05/04/2019 1420   MCH 23.8 (L) 09/04/2019 0130   MCHC 29.5 (L) 09/04/2019 0130   RDW 21.1 (H) 09/04/2019 0130   RDW 16.3 (H) 05/04/2019 1420   LYMPHSABS 1.5 05/04/2019 1420   MONOABS 0.3 12/04/2017 1858   EOSABS 0.1 05/04/2019 1420   BASOSABS 0.0 05/04/2019 1420   Iron/TIBC/Ferritin/ %Sat    Component Value Date/Time   IRON 151 09/08/2019 1512   TIBC 324 09/08/2019 1512   FERRITIN 17 09/08/2019 1512   IRONPCTSAT 47 09/08/2019 1512   Lipid Panel     Component Value Date/Time   CHOL 213 (H) 12/17/2018 1522   TRIG 87 12/17/2018 1522   HDL 71 12/17/2018 1522   CHOLHDL 3.0 12/17/2018 1522   LDLCALC 125 (H) 12/17/2018 1522   Hepatic Function Panel     Component Value Date/Time   PROT 7.7 09/08/2018 1042   ALBUMIN 4.5 09/08/2018 1042   AST 25 09/08/2018 1042   ALT 30 09/08/2018 1042   ALKPHOS 78 09/08/2018 1042   BILITOT 0.8 09/08/2018 1042      Component Value Date/Time   TSH 0.755 09/08/2018 1042      I, Trixie Dredge, am acting as Location manager for Dennard Nip, MD I have reviewed the above documentation for accuracy and completeness, and I agree with the above. -Dennard Nip, MD

## 2019-10-13 ENCOUNTER — Ambulatory Visit (HOSPITAL_COMMUNITY): Payer: 59 | Admitting: Psychiatry

## 2019-10-13 ENCOUNTER — Other Ambulatory Visit: Payer: Self-pay

## 2019-10-20 ENCOUNTER — Other Ambulatory Visit: Payer: Self-pay

## 2019-10-20 ENCOUNTER — Other Ambulatory Visit: Payer: Self-pay | Admitting: Nurse Practitioner

## 2019-10-20 ENCOUNTER — Ambulatory Visit (INDEPENDENT_AMBULATORY_CARE_PROVIDER_SITE_OTHER): Payer: 59 | Admitting: Family Medicine

## 2019-10-20 ENCOUNTER — Encounter (INDEPENDENT_AMBULATORY_CARE_PROVIDER_SITE_OTHER): Payer: Self-pay | Admitting: Family Medicine

## 2019-10-20 VITALS — BP 123/85 | HR 87 | Temp 98.2°F | Ht 62.0 in | Wt 196.0 lb

## 2019-10-20 DIAGNOSIS — E66812 Obesity, class 2: Secondary | ICD-10-CM

## 2019-10-20 DIAGNOSIS — K5909 Other constipation: Secondary | ICD-10-CM | POA: Diagnosis not present

## 2019-10-20 DIAGNOSIS — Z9189 Other specified personal risk factors, not elsewhere classified: Secondary | ICD-10-CM

## 2019-10-20 DIAGNOSIS — Z6835 Body mass index (BMI) 35.0-35.9, adult: Secondary | ICD-10-CM

## 2019-10-20 DIAGNOSIS — F3289 Other specified depressive episodes: Secondary | ICD-10-CM | POA: Diagnosis not present

## 2019-10-20 MED ORDER — BUPROPION HCL ER (SR) 100 MG PO TB12: 100.0000 mg | ORAL_TABLET | Freq: Every day | ORAL | 0 refills | Status: DC

## 2019-10-20 MED ORDER — DOCUSATE SODIUM 100 MG PO CAPS: 100.0000 mg | ORAL_CAPSULE | Freq: Two times a day (BID) | ORAL | 0 refills | Status: DC

## 2019-10-25 NOTE — Progress Notes (Signed)
Office: [phone]  /  Fax: [phone]   HPI:   Chief Complaint: OBESITY Patricia Lin is here to discuss her progress with her obesity treatment plan. She is on the portion control better and make smarter food choices, such as increase vegetables and decrease simple carbohydrates and is following her eating plan approximately 0 % of the time. She states she is exercising 0 minutes 0 times per week. Patricia Lin's last visit in our office was 9 month ago. She got off track during COVID and has had to stay at home and help her kids with virtual learning. She feels she is ready to get back on track.  Her weight is 196 lb (88.9 kg) today and has gained 11 lbs since her last visit. She has lost 0 lbs since starting treatment with [place].  Constipation Patricia Lin feels her Wellbutrin affected her constipation, so she stopped. She has not had any change in constipation. She denies hematochezia or melena. She denies drinking less H20 recently.  Depression with Emotional Eating Behaviors Patricia Lin had stopped her Wellbutrin due to constipation. She has noted emotional eating. She asks to restart it now. Patricia Lin struggles with emotional eating and using food for comfort to the extent that it is negatively impacting her health. She often snacks when she is not hungry. Patricia Lin sometimes feels she is out of control and then feels guilty that she made poor food choices. She has been working on behavior modification techniques to help reduce her emotional eating and has been somewhat successful. She shows no sign of suicidal or homicidal ideations.  Depression screen [facility] 2/9 06/14/2019 05/03/2019 01/28/2019 12/17/2018 09/08/2018  Decreased Interest 0 3 0 2 1  Down, Depressed, Hopeless 0 3 0 0 3  PHQ - 2 Score 0 6 0 2 4  Altered sleeping - 3 - - 3  Tired, decreased energy - 3 - - 2  Change in appetite - 3 - - 2  Feeling bad or failure about yourself  - 1 - - 1  Trouble concentrating - 3 - - 1  Moving slowly or fidgety/restless -  0 - - 0  Suicidal thoughts - 0 - - 1  PHQ-9 Score - 19 - - 14  Difficult doing work/chores - Somewhat difficult - - Somewhat difficult   At risk for cardiovascular disease Patricia Lin is at a higher than average risk for cardiovascular disease due to obesity. She currently denies any chest pain.  ASSESSMENT AND PLAN:  Other constipation - Plan: docusate sodium (COLACE) 100 MG capsule  Other depression - with emotional eating - Plan: buPROPion (WELLBUTRIN SR) 100 MG 12 hr tablet  At risk for heart disease  Class 2 severe obesity with serious comorbidity and body mass index (BMI) of 35.0 to 35.9 in adult, unspecified obesity type (HCC)  PLAN:  Constipation Patricia Lin was informed decrease bowel movement frequency is normal while losing weight, but stools should not be hard or painful. She was advised to increase her H20 intake and work on increasing her fiber intake. High fiber foods were discussed today. Patricia Lin agrees to add OTC Colace 100 mg BID #60 with no refills. Patricia Lin agrees to follow up with our clinic in 3 weeks.  Depression with Emotional Eating Behaviors We discussed behavior modification techniques today to help Patricia Lin deal with her emotional eating and depression. Patricia Lin agrees to restart Wellbutrin SR 100 mg q AM #30 with no refills. Patricia Lin agrees to follow up with our clinic in 3 weeks.  Cardiovascular risk counseling Patricia Lin  was given extended (15 minutes) coronary artery disease prevention counseling today. She is 38 y.o. female and has risk factors for heart disease including obesity. We discussed intensive lifestyle modifications today with an emphasis on specific weight loss instructions and strategies. Pt was also informed of the importance of increasing exercise and decreasing saturated fats to help prevent heart disease.  Obesity Patricia Lin is currently in the action stage of change. As such, her goal is to continue with weight loss efforts She has agreed to follow the Category  3 plan Patricia Lin has been instructed to work up to a goal of 150 minutes of combined cardio and strengthening exercise per week for weight loss and overall health benefits. We discussed the following Behavioral Modification Strategies today: increasing lean protein intake, work on meal planning and easy cooking plans and holiday eating strategies    Patricia Lin has agreed to follow up with our clinic in 3 weeks. She was informed of the importance of frequent follow up visits to maximize her success with intensive lifestyle modifications for her multiple health conditions.  ALLERGIES: Allergies  Allergen Reactions  . Latex Itching and Rash    MEDICATIONS: Current Outpatient Medications on File Prior to Visit  Medication Sig Dispense Refill  . ibuprofen (ADVIL) 200 MG tablet Take 400 mg by mouth every 6 (six) hours as needed for moderate pain.    . ferrous sulfate 325 (65 FE) MG tablet Take 1 tablet (325 mg total) by mouth 3 (three) times daily with meals. (Patient not taking: Reported on 10/20/2019) 90 tablet 3  . Vitamin D, Ergocalciferol, (DRISDOL) 1.25 MG (50000 UT) CAPS capsule Take 1 capsule (50,000 Units total) by mouth every 7 (seven) days. (Patient not taking: Reported on 10/20/2019) 4 capsule 0   No current facility-administered medications on file prior to visit.     PAST MEDICAL HISTORY: Past Medical History:  Diagnosis Date  . Abnormal Pap smear   . Anemia   . Anxiety   . Back pain   . BV (bacterial vaginosis) 2011  . Chlamydia infection   . CIN I (cervical intraepithelial neoplasia I) 2008  . Depression   . Dyspnea   . Dysrhythmia 2009   TACHYCARDIA;   HAD W/U 2010? POSSIBLE HEART VALVE ISSUE; NOT F/U NEEDED X 3 YEARS PER PT  . Dysuria 2010  . Fatigue   . H/O pre-eclampsia in prior pregnancy, currently pregnant   . H/O varicella   . H/O vitamin D deficiency   . Headache(784.0)    MIGRAINES  . HTN (hypertension)   . Hx of breast reduction, elective   . Hx: UTI  (urinary tract infection)   . Infection    UTI DURING PREGNANCY  . Infection    YEAST WITH PREGNANCY  . Infection    CHLAMYDIA X 1  . Knee pain   . 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 Low iron   . PIH (pregnancy induced hypertension) 11-2012   Today pp one month.  B/P nl, and 122/76 on repeat-sitting  . Postpartum depression 2011   NO MEDS  . Pregnancy induced hypertension    ALL PREGNANCIES  . Shortness of breath    WITH EXERTION SICNE PREGNANCY  . Tachycardia   . Tachycardia 2010   HAS HAD WORKUP. UNSURE IF HAS POSSIBLE HEART VALVE ISSUE  . Vulvitis 2009    PAST SURGICAL HISTORY: Past Surgical History:  Procedure Laterality Date  . BREAST REDUCTION SURGERY  2008  . BREAST SURGERY    . DILATION AND CURETTAGE OF UTERUS    . FINGER SURGERY     Right index finger  . TUBAL LIGATION  01/09/2013   Procedure: POST PARTUM TUBAL LIGATION;  Surgeon: Betsy Coder, MD;  Location: [facility];  Service: Gynecology;  Laterality: Bilateral;  post partum tubal ligation bilateral  . US ECHOCARDIOGRAPHY  06/18/2009   ef 55-60%  . WISDOM TOOTH EXTRACTION      SOCIAL HISTORY: Social History   Tobacco Use  . Smoking status: Passive Smoke Exposure - Never Smoker  . Smokeless tobacco: Never Used  Substance Use Topics  . Alcohol use: Yes    Comment: socially  . Drug use: No    FAMILY HISTORY: Family History  Problem Relation Age of Onset  . Hypertension Mother   . Hyperlipidemia Mother   . Coronary artery disease Mother   . Heart disease Mother        high cholesterol  . Asthma Mother   . Diabetes Mother   . Depression Mother   . Anxiety disorder Mother   . Sleep apnea Mother   . Obesity Mother   . Eating disorder Mother   . Hypertension Father   . Hypertension Maternal Grandmother   . Hypertension Maternal Grandfather   . Asthma Daughter   . Alcohol abuse Maternal Uncle   . Drug abuse Maternal Uncle     ROS:  Review of Systems  Constitutional: Negative for weight loss.  Cardiovascular: Negative for chest pain.  Gastrointestinal: Positive for constipation. Negative for melena.       Negative hematochezia  Psychiatric/Behavioral: Positive for depression. Negative for suicidal ideas.    PHYSICAL EXAM: Blood pressure 123/85, pulse 87, temperature 98.2 F (36.8 C), temperature source Oral, height 5\' 2"  (1.575 m), weight 196 lb (88.9 kg), SpO2 100 %. Body mass index is 35.85 kg/m. Physical Exam Vitals signs reviewed.  Constitutional:      Appearance: Normal appearance. She is obese.  Cardiovascular:     Rate and Rhythm: Normal rate.     Pulses: Normal pulses.  Pulmonary:     Effort: Pulmonary effort is normal.     Breath sounds: Normal breath sounds.  Musculoskeletal: Normal range of motion.  Skin:    General: Skin is warm and dry.  Neurological:     Mental Status: She is alert and oriented to person, place, and time.  Psychiatric:        Mood and Affect: Mood normal.        Behavior: Behavior normal.     RECENT LABS AND TESTS: BMET    Component Value Date/Time   NA 139 09/04/2019 0130   NA 137 12/17/2018 1522   K 3.6 09/04/2019 0130   CL 105 09/04/2019 0130   CO2 25 09/04/2019 0130   GLUCOSE 115 (H) 09/04/2019 0130   BUN 19 09/04/2019 0130   BUN 15 12/17/2018 1522   CREATININE 0.84 09/04/2019 0130   CREATININE 0.64 01/17/2013 1720   CALCIUM 9.6 09/04/2019 0130   GFRNONAA >60 09/04/2019 0130   GFRAA >60 09/04/2019 0130   Lab Results  Component Value Date   HGBA1C 5.2 09/08/2018   Lab Results  Component Value Date   INSULIN 11.5 09/08/2018   CBC    Component Value Date/Time   WBC 5.1 09/04/2019 0130   RBC 4.66 09/04/2019 0130   HGB 11.1 (L) 09/04/2019 0130   HGB 9.0 (L) 05/04/2019 1420   HCT 37.6 09/04/2019  0130   HCT 28.7 (L) 05/04/2019 1420   PLT 305 09/04/2019 0130   PLT 328 05/04/2019 1420   MCV 80.7 09/04/2019 0130   MCV 73 (L) 05/04/2019 1420   MCH  23.8 (L) 09/04/2019 0130   MCHC 29.5 (L) 09/04/2019 0130   RDW 21.1 (H) 09/04/2019 0130   RDW 16.3 (H) 05/04/2019 1420   LYMPHSABS 1.5 05/04/2019 1420   MONOABS 0.3 12/04/2017 1858   EOSABS 0.1 05/04/2019 1420   BASOSABS 0.0 05/04/2019 1420   Iron/TIBC/Ferritin/ %Sat    Component Value Date/Time   IRON 151 09/08/2019 1512   TIBC 324 09/08/2019 1512   FERRITIN 17 09/08/2019 1512   IRONPCTSAT 47 09/08/2019 1512   Lipid Panel     Component Value Date/Time   CHOL 213 (H) 12/17/2018 1522   TRIG 87 12/17/2018 1522   HDL 71 12/17/2018 1522   CHOLHDL 3.0 12/17/2018 1522   LDLCALC 125 (H) 12/17/2018 1522   Hepatic Function Panel     Component Value Date/Time   PROT 7.7 09/08/2018 1042   ALBUMIN 4.5 09/08/2018 1042   AST 25 09/08/2018 1042   ALT 30 09/08/2018 1042   ALKPHOS 78 09/08/2018 1042   BILITOT 0.8 09/08/2018 1042      Component Value Date/Time   TSH 0.755 09/08/2018 1042      OBESITY BEHAVIORAL INTERVENTION VISIT  Today's visit was # 21   Starting weight: 191 lbs Starting date: 09/08/18 Today's weight : 196 lbs  Today's date: 10/20/2019 Total lbs lost to date: 0    ASK: We discussed the diagnosis of obesity with Makenli Mallicoat today and Patricia Lin agreed to give Patricia Lin permission to discuss obesity behavioral modification therapy today.  ASSESS: Jayme has the diagnosis of obesity and her BMI today is 35.84 Jerline is in the action stage of change   ADVISE: Patricia Lin was educated on the multiple health risks of obesity as well as the benefit of weight loss to improve her health. She was advised of the need for [place] term treatment and the importance of lifestyle modifications to improve her current health and to decrease her risk of future health problems.  AGREE: Multiple dietary modification options and treatment options were discussed and  Lousie agreed to follow the recommendations documented in the above note.  ARRANGE: Tyshika was educated on the importance of  frequent visits to treat obesity as outlined per CMS and USPSTF guidelines and agreed to schedule her next follow up appointment today.  I, Burt KnackSharon Martin, am acting as transcriptionist for Quillian Quincearen Beasley, MD  I have reviewed the above documentation for accuracy and completeness, and I agree with the above. -Quillian Quincearen Beasley, MD

## 2019-10-27 ENCOUNTER — Other Ambulatory Visit: Payer: Self-pay

## 2019-10-27 ENCOUNTER — Ambulatory Visit (INDEPENDENT_AMBULATORY_CARE_PROVIDER_SITE_OTHER): Payer: 59 | Admitting: Psychiatry

## 2019-10-27 ENCOUNTER — Encounter (HOSPITAL_COMMUNITY): Payer: Self-pay | Admitting: Psychiatry

## 2019-10-27 DIAGNOSIS — F330 Major depressive disorder, recurrent, mild: Secondary | ICD-10-CM

## 2019-10-27 DIAGNOSIS — F411 Generalized anxiety disorder: Secondary | ICD-10-CM

## 2019-10-27 NOTE — Progress Notes (Signed)
Virtual Visit via Video Note  I connected with Patricia Lin on 10/27/19 at  3:00 PM EST by a video enabled telemedicine application and verified that I am speaking with the correct person using two identifiers.  Location: Patient: Patricia Lin Provider: Lise Auer, LCSW   I discussed the limitations of evaluation and management by telemedicine and the availability of in person appointments. The patient expressed understanding and agreed to proceed.  History of Present Illness: MDD and GAD   Observations/Objective: Counselor met with Patricia Lin for individual therapy via Webex. Counselor assessed MH symptoms and progress on treatment plan goals. Patricia Lin presented with moderate depression and moderate anxiety. Patricia Lin denied suicidal ideation or self-harm behaviors. Patricia Lin shared that she will be returning to [employer] next week, after several months out of work. Counselor assessed thoughts, feelings, concerns and coping strategies in transitioning back into full time work from full-time family life and treatment. Patricia Lin was able to identify coping strategies to put in place to prompt a healthy transition back to work. We discussed advocacy for mental health needs in the workplace and intentional development of work-life balance. Counselor explored current home life stressors and utilization of communication skills and boundaries between family members. Patricia Lin identified that he self-esteem and confidence has been impacted by marital conversations and interactions. Counselor and Patricia Lin identified positive cognitive coping strategies to combat negative cognitions and cognitive distortions. Patricia Lin shared about plans for the holidays and how she is navigating COVID protocols, as well as grieving the loss of her late grandfather who was an interictal part of her upbringing and family life.   Assessment and Plan: Counselor will continue to meet with patient to address treatment plan goals. Patient will continue to  follow recommendations of providers and implement skills learned in session.  Follow Up Instructions: Counselor will send information for next session via Webex.     I discussed the assessment and treatment plan with the patient. The patient was provided an opportunity to ask questions and all were answered. The patient agreed with the plan and demonstrated an understanding of the instructions.   The patient was advised to call back or seek an in-person evaluation if the symptoms worsen or if the condition fails to improve as anticipated.  I provided 55 minutes of non-face-to-face time during this encounter.   Lise Auer, LCSW

## 2019-11-09 ENCOUNTER — Encounter (INDEPENDENT_AMBULATORY_CARE_PROVIDER_SITE_OTHER): Payer: Self-pay | Admitting: Family Medicine

## 2019-11-09 ENCOUNTER — Other Ambulatory Visit: Payer: Self-pay

## 2019-11-09 ENCOUNTER — Ambulatory Visit (INDEPENDENT_AMBULATORY_CARE_PROVIDER_SITE_OTHER): Payer: 59 | Admitting: Family Medicine

## 2019-11-09 VITALS — BP 119/83 | HR 93 | Temp 98.1°F | Ht 62.0 in | Wt 193.0 lb

## 2019-11-09 DIAGNOSIS — F3289 Other specified depressive episodes: Secondary | ICD-10-CM

## 2019-11-09 DIAGNOSIS — E559 Vitamin D deficiency, unspecified: Secondary | ICD-10-CM | POA: Diagnosis not present

## 2019-11-09 DIAGNOSIS — Z9189 Other specified personal risk factors, not elsewhere classified: Secondary | ICD-10-CM

## 2019-11-09 DIAGNOSIS — Z6835 Body mass index (BMI) 35.0-35.9, adult: Secondary | ICD-10-CM

## 2019-11-09 MED ORDER — VITAMIN D (ERGOCALCIFEROL) 1.25 MG (50000 UNIT) PO CAPS: 50000.0000 [IU] | ORAL_CAPSULE | ORAL | 0 refills | Status: DC

## 2019-11-09 NOTE — Progress Notes (Signed)
Office: [phone]  /  Fax: [phone]   HPI:   Chief Complaint: OBESITY Saraia is here to discuss her progress with her obesity treatment plan. She is on the Category 3 plan and is following her eating plan approximately 0% of the time. She states she is exercising 0 minutes 0 times per week. Denesha did well avoiding weight gain over Thanksgiving. She notes having a decreased appetite and skipping meals. She feels this is stress related. Her weight is 193 lb (87.5 kg) today and has had a weight loss of 3 pounds over a period of 3 weeks since her last visit. She has lost 0 lbs since starting treatment with [place].  Vitamin D deficiency Markeshia has a diagnosis of Vitamin D deficiency. She is currently stable on prescription Vit D and requests a refill. She denies nausea, vomiting or muscle weakness.  At risk for cardiovascular disease Breawna is at a higher than average risk for cardiovascular disease due to obesity. She currently denies any chest pain.  Depression  Kana is struggling with emotional eating and using food for comfort to the extent that it is negatively impacting her health. She often snacks when she is not hungry. Blythe sometimes feels she is out of control and then feels guilty that she made poor food choices. She has been working on behavior modification techniques to help reduce her emotional eating and has been somewhat successful. Kandra hasn't taken her Wellbutrin in approximately 1 month. She notes increased stress and decreased appetite. She has started back to work and adjusting to her new routine. She reports decreased sleep. She shows no sign of suicidal or homicidal ideations.  Depression screen [facility] 2/9 06/14/2019 05/03/2019 01/28/2019 12/17/2018 09/08/2018  Decreased Interest 0 3 0 2 1  Down, Depressed, Hopeless 0 3 0 0 3  PHQ - 2 Score 0 6 0 2 4  Altered sleeping - 3 - - 3  Tired, decreased energy - 3 - - 2  Change in appetite - 3 - - 2  Feeling bad or failure  about yourself  - 1 - - 1  Trouble concentrating - 3 - - 1  Moving slowly or fidgety/restless - 0 - - 0  Suicidal thoughts - 0 - - 1  PHQ-9 Score - 19 - - 14  Difficult doing work/chores - Somewhat difficult - - Somewhat difficult   ASSESSMENT AND PLAN:  Vitamin D deficiency - Plan: Vitamin D, Ergocalciferol, (DRISDOL) 1.25 MG (50000 UT) CAPS capsule  Other depression, emotional eating  At risk for heart disease  Class 2 severe obesity with serious comorbidity and body mass index (BMI) of 35.0 to 35.9 in adult, unspecified obesity type (HCC)  PLAN:  Vitamin D Deficiency Taneesha was informed that low Vitamin D levels contributes to fatigue and are associated with obesity, breast, and colon cancer. She agrees to continue to take prescription Vit D @ 50,000 IU every week #4 with 0 refills and will follow-up for routine testing of Vitamin D, at least 2-3 times per year. She was informed of the risk of over-replacement of Vitamin D and agrees to not increase her dose unless she discusses this with [place] first. Alesi agrees to follow-up with our clinic in 4 weeks.  Cardiovascular risk counseling Carolann was given extended (15 minutes) coronary artery disease prevention counseling today. She is 38 y.o. female and has risk factors for heart disease including obesity. We discussed intensive lifestyle modifications today with an emphasis on specific weight loss instructions and  strategies. Pt was also informed of the importance of increasing exercise and decreasing saturated fats to help prevent heart disease.  Emotional Eating Behaviors (other depression) We discussed behavior modification techniques today to help Sosie deal with her emotional eating behaviors. Amaura was advised to restart Wellbutrin and she agreed to do so.  Obesity Xiamara is currently in the action stage of change. As such, her goal is to continue with weight loss efforts. She has agreed to portion control better and make  smarter food choices, such as increase vegetables and decrease simple carbohydrates.  Chenell has been instructed to work up to a goal of 150 minutes of combined cardio and strengthening exercise per week for weight loss and overall health benefits. We discussed the following Behavioral Modification Strategies today: increasing lean protein intake, increasing vegetables, and no skipping meals.  Heavyn has agreed to follow-up with our clinic in 4 weeks. She was informed of the importance of frequent follow-up visits to maximize her success with intensive lifestyle modifications for her multiple health conditions.  ALLERGIES: Allergies  Allergen Reactions  . Latex Itching and Rash    MEDICATIONS: Current Outpatient Medications on File Prior to Visit  Medication Sig Dispense Refill  . buPROPion (WELLBUTRIN SR) 100 MG 12 hr tablet Take 1 tablet (100 mg total) by mouth daily. (Patient not taking: Reported on 11/09/2019) 30 tablet 0  . docusate sodium (COLACE) 100 MG capsule Take 1 capsule (100 mg total) by mouth 2 (two) times daily. (Patient not taking: Reported on 11/09/2019) 60 capsule 0  . ferrous sulfate 325 (65 FE) MG tablet Take 1 tablet (325 mg total) by mouth 3 (three) times daily with meals. (Patient not taking: Reported on 11/09/2019) 90 tablet 3  . ibuprofen (ADVIL) 200 MG tablet Take 400 mg by mouth every 6 (six) hours as needed for moderate pain.     No current facility-administered medications on file prior to visit.     PAST MEDICAL HISTORY: Past Medical History:  Diagnosis Date  . Abnormal Pap smear   . Anemia   . Anxiety   . Back pain   . BV (bacterial vaginosis) 2011  . Chlamydia infection   . CIN I (cervical intraepithelial neoplasia I) 2008  . Depression   . Dyspnea   . Dysrhythmia 2009   TACHYCARDIA;   HAD W/U 2010? POSSIBLE HEART VALVE ISSUE; NOT F/U NEEDED X 3 YEARS PER PT  . Dysuria 2010  . Fatigue   . H/O pre-eclampsia in prior pregnancy, currently pregnant   .  H/O varicella   . H/O vitamin D deficiency   . Headache(784.0)    MIGRAINES  . HTN (hypertension)   . Hx of breast reduction, elective   . Hx: UTI (urinary tract infection)   . Infection    UTI DURING PREGNANCY  . Infection    YEAST WITH PREGNANCY  . Infection    CHLAMYDIA X 1  . Knee pain   . 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 Low iron   . PIH (pregnancy induced hypertension) 11-2012   Today pp one month.  B/P nl, and 122/76 on repeat-sitting  . Postpartum depression 2011   NO MEDS  . Pregnancy induced hypertension    ALL PREGNANCIES  . Shortness of breath    WITH EXERTION SICNE PREGNANCY  . Tachycardia   . Tachycardia 2010   HAS HAD WORKUP. UNSURE IF HAS POSSIBLE HEART VALVE ISSUE  . Vulvitis  2009    PAST SURGICAL HISTORY: Past Surgical History:  Procedure Laterality Date  . BREAST REDUCTION SURGERY  2008  . BREAST SURGERY    . DILATION AND CURETTAGE OF UTERUS    . FINGER SURGERY     Right index finger  . TUBAL LIGATION  01/09/2013   Procedure: POST PARTUM TUBAL LIGATION;  Surgeon: Michael LitterNaima A Dillard, MD;  Location: [facility];  Service: Gynecology;  Laterality: Bilateral;  post partum tubal ligation bilateral  . US ECHOCARDIOGRAPHY  06/18/2009   ef 55-60%  . WISDOM TOOTH EXTRACTION      SOCIAL HISTORY: Social History   Tobacco Use  . Smoking status: Passive Smoke Exposure - Never Smoker  . Smokeless tobacco: Never Used  Substance Use Topics  . Alcohol use: Yes    Comment: socially  . Drug use: No    FAMILY HISTORY: Family History  Problem Relation Age of Onset  . Hypertension Mother   . Hyperlipidemia Mother   . Coronary artery disease Mother   . Heart disease Mother        high cholesterol  . Asthma Mother   . Diabetes Mother   . Depression Mother   . Anxiety disorder Mother   . Sleep apnea Mother   . Obesity Mother   . Eating disorder Mother   . Hypertension Father   . Hypertension Maternal  Grandmother   . Hypertension Maternal Grandfather   . Asthma Daughter   . Alcohol abuse Maternal Uncle   . Drug abuse Maternal Uncle    ROS: Review of Systems  Gastrointestinal: Negative for nausea and vomiting.  Musculoskeletal:       Negative for muscle weakness.  Psychiatric/Behavioral: Positive for depression. Negative for suicidal ideas.       Negative for homicidal ideas.   PHYSICAL EXAM: Blood pressure 119/83, pulse 93, temperature 98.1 F (36.7 C), temperature source Oral, height 5\' 2"  (1.575 m), weight 193 lb (87.5 kg), last menstrual period 11/02/2019, SpO2 93 %. Body mass index is 35.3 kg/m. Physical Exam Vitals signs reviewed.  Constitutional:      Appearance: Normal appearance. She is obese.  Cardiovascular:     Rate and Rhythm: Normal rate.     Pulses: Normal pulses.  Pulmonary:     Effort: Pulmonary effort is normal.     Breath sounds: Normal breath sounds.  Musculoskeletal: Normal range of motion.  Skin:    General: Skin is warm and dry.  Neurological:     Mental Status: She is alert and oriented to person, place, and time.  Psychiatric:        Behavior: Behavior normal.   RECENT LABS AND TESTS: BMET    Component Value Date/Time   NA 139 09/04/2019 0130   NA 137 12/17/2018 1522   K 3.6 09/04/2019 0130   CL 105 09/04/2019 0130   CO2 25 09/04/2019 0130   GLUCOSE 115 (H) 09/04/2019 0130   BUN 19 09/04/2019 0130   BUN 15 12/17/2018 1522   CREATININE 0.84 09/04/2019 0130   CREATININE 0.64 01/17/2013 1720   CALCIUM 9.6 09/04/2019 0130   GFRNONAA >60 09/04/2019 0130   GFRAA >60 09/04/2019 0130   Lab Results  Component Value Date   HGBA1C 5.2 09/08/2018   Lab Results  Component Value Date   INSULIN 11.5 09/08/2018   CBC    Component Value Date/Time   WBC 5.1 09/04/2019 0130   RBC 4.66 09/04/2019 0130   HGB 11.1 (L) 09/04/2019 0130   HGB  9.0 (L) 05/04/2019 1420   HCT 37.6 09/04/2019 0130   HCT 28.7 (L) 05/04/2019 1420   PLT 305 09/04/2019  0130   PLT 328 05/04/2019 1420   MCV 80.7 09/04/2019 0130   MCV 73 (L) 05/04/2019 1420   MCH 23.8 (L) 09/04/2019 0130   MCHC 29.5 (L) 09/04/2019 0130   RDW 21.1 (H) 09/04/2019 0130   RDW 16.3 (H) 05/04/2019 1420   LYMPHSABS 1.5 05/04/2019 1420   MONOABS 0.3 12/04/2017 1858   EOSABS 0.1 05/04/2019 1420   BASOSABS 0.0 05/04/2019 1420   Iron/TIBC/Ferritin/ %Sat    Component Value Date/Time   IRON 151 09/08/2019 1512   TIBC 324 09/08/2019 1512   FERRITIN 17 09/08/2019 1512   IRONPCTSAT 47 09/08/2019 1512   Lipid Panel     Component Value Date/Time   CHOL 213 (H) 12/17/2018 1522   TRIG 87 12/17/2018 1522   HDL 71 12/17/2018 1522   CHOLHDL 3.0 12/17/2018 1522   LDLCALC 125 (H) 12/17/2018 1522   Hepatic Function Panel     Component Value Date/Time   PROT 7.7 09/08/2018 1042   ALBUMIN 4.5 09/08/2018 1042   AST 25 09/08/2018 1042   ALT 30 09/08/2018 1042   ALKPHOS 78 09/08/2018 1042   BILITOT 0.8 09/08/2018 1042      Component Value Date/Time   TSH 0.755 09/08/2018 1042   Results for Vold, Markan (MRN [id number]) as of 11/09/2019 16:25  Ref. Range 09/08/2018 10:42  Vitamin D, 25-Hydroxy Latest Ref Range: 30.0 - 100.0 ng/mL 9.6 (L)   OBESITY BEHAVIORAL INTERVENTION VISIT  Today's visit was #22  Starting weight: 191 lbs Starting date: 09/08/2018 Today's weight: 193 lbs  Today's date: 11/09/2019 Total lbs lost to date: 0     11/09/2019  Height 5\' 2"  (1.575 m)  Weight 193 lb (87.5 kg)  BMI (Calculated) 35.29  BLOOD PRESSURE - SYSTOLIC 315  BLOOD PRESSURE - DIASTOLIC 83   Body Fat % 17.6 %  Total Body Water (lbs) 75.4 lbs   ASK: We discussed the diagnosis of obesity with Starla Dhaliwal today and Tiffany agreed to give [place] permission to discuss obesity behavioral modification therapy today.  ASSESS: Viviana has the diagnosis of obesity and her BMI today is 35.4. Lorrie is in the action stage of change.   ADVISE: Ilhan was educated on the multiple health risks of  obesity as well as the benefit of weight loss to improve her health. She was advised of the need for [place] term treatment and the importance of lifestyle modifications to improve her current health and to decrease her risk of future health problems.  AGREE: Multiple dietary modification options and treatment options were discussed and  Charisa agreed to follow the recommendations documented in the above note.  ARRANGE: Destynee was educated on the importance of frequent visits to treat obesity as outlined per CMS and USPSTF guidelines and agreed to schedule her next follow up appointment today.  I, Michaelene Song, am acting as Location manager for Dennard Nip, MD  I have reviewed the above documentation for accuracy and completeness, and I agree with the above. -Dennard Nip, MD

## 2019-11-23 ENCOUNTER — Ambulatory Visit
Admission: EM | Admit: 2019-11-23 | Discharge: 2019-11-23 | Disposition: A | Discharge status: Home / Self Care | Payer: 59 | Attending: Physician Assistant | Admitting: Physician Assistant

## 2019-11-23 DIAGNOSIS — R350 Frequency of micturition: Secondary | ICD-10-CM | POA: Insufficient documentation

## 2019-11-23 DIAGNOSIS — K5909 Other constipation: Secondary | ICD-10-CM | POA: Diagnosis not present

## 2019-11-23 DIAGNOSIS — R1032 Left lower quadrant pain: Secondary | ICD-10-CM | POA: Insufficient documentation

## 2019-11-23 LAB — POCT URINALYSIS DIP (MANUAL ENTRY)
Bilirubin, UA: NEGATIVE
Glucose, UA: NEGATIVE mg/dL
Ketones, POC UA: NEGATIVE mg/dL
Leukocytes, UA: NEGATIVE
Nitrite, UA: NEGATIVE
Spec Grav, UA: >=1.03 — AB (ref 1.010–1.025)
Urobilinogen, UA: 1 E.U./dL
pH, UA: 6.5 (ref 5.0–8.0)

## 2019-11-23 MED ORDER — DOCUSATE SODIUM 100 MG PO CAPS: 100.0000 mg | ORAL_CAPSULE | Freq: Two times a day (BID) | ORAL | 0 refills | Status: DC | PRN

## 2019-11-23 MED ORDER — POLYETHYLENE GLYCOL 3350 17 G PO PACK: 17.0000 g | PACK | Freq: Every day | ORAL | 0 refills | Status: DC

## 2019-11-23 NOTE — Discharge Instructions (Signed)
Urine without alarming signs. At this time, start laxatives for possible constipation causing symptoms. Keep hydrated, urine should be clear to pale yellow in color. If symptoms worsens, nausea/vomiting, fever, bending over for pain, go to the ED for further evaluation needed.

## 2019-11-23 NOTE — ED Provider Notes (Signed)
[facility]    CSN: [id number] Arrival date & time: 11/23/19  1907      History   Chief Complaint Chief Complaint  Patient presents with  . Abdominal Pain    HPI Patricia Lin is a 38 y.o. female.   38 year old female comes in for 3 day history of LLQ pain, urinary changes. States has had urinary frequency. Her LLQ pain is intermittent, worse with urination. Had nausea without vomiting that has since resolved. Denies dysuria, hematuria. Has had constipation with hard BM. Denies URI symptoms such as cough, congestion, sore throat. Chills without fever, body aches. Denies vaginal discharge, itching. LMP 11/02/2019. Sexually active with one female partner, no condom use. No worries for STDs.      Past Medical History:  Diagnosis Date  . Abnormal Pap smear   . Anemia   . Anxiety   . Back pain   . BV (bacterial vaginosis) 2011  . Chlamydia infection   . CIN I (cervical intraepithelial neoplasia I) 2008  . Depression   . Dyspnea   . Dysrhythmia 2009   TACHYCARDIA;   HAD W/U 2010? POSSIBLE HEART VALVE ISSUE; NOT F/U NEEDED X 3 YEARS PER PT  . Dysuria 2010  . Fatigue   . H/O pre-eclampsia in prior pregnancy, currently pregnant   . H/O varicella   . H/O vitamin D deficiency   . Headache(784.0)    MIGRAINES  . HTN (hypertension)   . Hx of breast reduction, elective   . Hx: UTI (urinary tract infection)   . Infection    UTI DURING PREGNANCY  . Infection    YEAST WITH PREGNANCY  . Infection    CHLAMYDIA X 1  . Knee pain   . 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 Low iron   . PIH (pregnancy induced hypertension) 11-2012   Today pp one month.  B/P nl, and 122/76 on repeat-sitting  . Postpartum depression 2011   NO MEDS  . Pregnancy induced hypertension    ALL PREGNANCIES  . Shortness of breath    WITH EXERTION SICNE PREGNANCY  . Tachycardia   . Tachycardia 2010   HAS HAD WORKUP. UNSURE IF HAS  POSSIBLE HEART VALVE ISSUE  . Vulvitis 2009    Patient Active Problem List   Diagnosis Date Noted  . Right lower quadrant abdominal pain 01/28/2019  . Generalized headaches 12/17/2018  . Anxiety 12/17/2018  . Anemia 12/05/2017  . S/P tubal ligation 02/09/2013  . Vaginal delivery 01/09/2013  . 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  . Fracture of left ulna 09/27/12 10/23/2012  . 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 Symptomatic anemia 09/03/2012  . H/O pre-eclampsia in prior pregnancy, currently pregnant 08/19/2012  . Chronic hypertension in pregnancy 08/19/2012  . Hx LEEP (loop electrosurgical excision procedure), cervix, pregnancy 08/19/2012  . Hx of postpartum depression, currently pregnant 08/19/2012  . Late prenatal care 08/19/2012  . Hx of precipitous labor and deliveries, antepartum 08/19/2012  . Muscle tension HAs 08/19/2012    Past Surgical History:  Procedure Laterality Date  . BREAST REDUCTION SURGERY  2008  . BREAST SURGERY    . DILATION AND CURETTAGE OF UTERUS    . FINGER SURGERY     Right index finger  . TUBAL LIGATION  01/09/2013   Procedure: POST PARTUM TUBAL LIGATION;  Surgeon: Betsy Coder, MD;  Location: [facility];  Service: Gynecology;  Laterality: Bilateral;  post  partum tubal ligation bilateral  . US ECHOCARDIOGRAPHY  06/18/2009   ef 55-60%  . WISDOM TOOTH EXTRACTION      OB History    Gravida  7   Para  4   Term  4   Preterm      AB  3   Living  4     SAB  1   TAB  2   Ectopic      Multiple      Live Births  4        Obstetric Comments  2011 PRECLAMPSIA PP; IN ICU X 3 DAYS         Home Medications    Prior to Admission medications   Medication Sig Start Date End Date Taking? Authorizing Provider  buPROPion (WELLBUTRIN SR) 100 MG 12 hr tablet Take 1 tablet (100 mg total) by mouth daily. Patient not taking: Reported on 11/09/2019 10/20/19   Quillian QuinceBeasley, Caren D, MD  docusate sodium (COLACE) 100 MG capsule Take 1  capsule (100 mg total) by mouth 2 (two) times daily as needed for mild constipation. 11/23/19   Cathie HoopsYu, Raymund Manrique V, PA-C  ferrous sulfate 325 (65 FE) MG tablet Take 1 tablet (325 mg total) by mouth 3 (three) times daily with meals. Patient not taking: Reported on 11/09/2019 05/16/19   Arnette FeltsMoore, Janece, FNP  ibuprofen (ADVIL) 200 MG tablet Take 400 mg by mouth every 6 (six) hours as needed for moderate pain.    [provider]  polyethylene glycol (MIRALAX) 17 g packet Take 17 g by mouth daily. 11/23/19   Cathie HoopsYu, Mandie Crabbe V, PA-C  Vitamin D, Ergocalciferol, (DRISDOL) 1.25 MG (50000 UT) CAPS capsule Take 1 capsule (50,000 Units total) by mouth every 7 (seven) days. 11/09/19   Wilder GladeBeasley, Caren D, MD    Family History Family History  Problem Relation Age of Onset  . Hypertension Mother   . Hyperlipidemia Mother   . Coronary artery disease Mother   . Heart disease Mother        high cholesterol  . Asthma Mother   . Diabetes Mother   . Depression Mother   . Anxiety disorder Mother   . Sleep apnea Mother   . Obesity Mother   . Eating disorder Mother   . Hypertension Father   . Hypertension Maternal Grandmother   . Hypertension Maternal Grandfather   . Asthma Daughter   . Alcohol abuse Maternal Uncle   . Drug abuse Maternal Uncle     Social History Social History   Tobacco Use  . Smoking status: Passive Smoke Exposure - Never Smoker  . Smokeless tobacco: Never Used  Substance Use Topics  . Alcohol use: Yes    Comment: socially  . Drug use: No     Allergies   Latex   Review of Systems Review of Systems  Reason unable to perform ROS: See HPI as above.     Physical Exam Triage Vital Signs ED Triage Vitals [11/23/19 1922]  Enc Vitals Group     BP (!) 145/96     Pulse Rate (!) 108     Resp 16     Temp 98.3 F (36.8 C)     Temp Source Oral     SpO2 97 %     Weight      Height      Head Circumference      Peak Flow      Pain Score 8     Pain Loc  Pain Edu?      Excl. in GC?     No data found.  Updated Vital Signs BP (!) 145/96 (BP Location: Left Arm)   Pulse (!) 108   Temp 98.3 F (36.8 C) (Oral)   Resp 16   LMP 11/02/2019 (Exact Date)   SpO2 97%   Physical Exam Constitutional:      General: She is not in acute distress.    Appearance: She is well-developed. She is not ill-appearing, toxic-appearing or diaphoretic.  HENT:     Head: Normocephalic and atraumatic.  Eyes:     Conjunctiva/sclera: Conjunctivae normal.     Pupils: Pupils are equal, round, and reactive to light.  Cardiovascular:     Rate and Rhythm: Normal rate and regular rhythm.     Heart sounds: Normal heart sounds. No murmur. No friction rub. No gallop.   Pulmonary:     Effort: Pulmonary effort is normal.     Breath sounds: Normal breath sounds. No wheezing or rales.  Abdominal:     General: Bowel sounds are normal.     Palpations: Abdomen is soft.     Tenderness: There is abdominal tenderness in the left lower quadrant. There is no right CVA tenderness, left CVA tenderness, guarding or rebound.  Skin:    General: Skin is warm and dry.  Neurological:     Mental Status: She is alert and oriented to person, place, and time.  Psychiatric:        Behavior: Behavior normal.        Judgment: Judgment normal.      UC Treatments / Results  Labs (all labs ordered are listed, but only abnormal results are displayed) Labs Reviewed  POCT URINALYSIS DIP (MANUAL ENTRY) - Abnormal; Notable for the following components:      Result Value   Spec Grav, UA >=1.030 (*)    Blood, UA trace-intact (*)    Protein Ur, POC trace (*)    All other components within normal limits  URINE CULTURE    EKG   Radiology No results found.  Procedures Procedures (including critical care time)  Medications Ordered in UC Medications - No data to display  Initial Impression / Assessment and Plan / UC Course  I have reviewed the triage vital signs and the nursing notes.  Pertinent labs & imaging  results that were available during my care of the patient were reviewed by me and considered in my medical decision making (see chart for details).    Urine with trace blood, no leuks/nitrite. Given current symptom, will also send for urine culture. ?constipation causing pain. Will have patient start laxatives for constipation at this time. Push fluids. Return precautions given. Patient expresses understanding and agrees to plan.  Final Clinical Impressions(s) / UC Diagnoses   Final diagnoses:  LLQ pain  Urinary frequency    ED Prescriptions    Medication Sig Dispense Auth. Provider   docusate sodium (COLACE) 100 MG capsule Take 1 capsule (100 mg total) by mouth 2 (two) times daily as needed for mild constipation. 10 capsule Yanet Balliet V, PA-C   polyethylene glycol (MIRALAX) 17 g packet Take 17 g by mouth daily. 14 each Belinda Fisher, PA-C     PDMP not reviewed this encounter.   Belinda Fisher, PA-C 11/23/19 2144

## 2019-11-23 NOTE — ED Triage Notes (Signed)
Pt c/o lt lower abdominal pain x3days. States pain worse after urinating and having urinary frequency

## 2019-11-24 ENCOUNTER — Encounter: Payer: Self-pay | Admitting: Nurse Practitioner

## 2019-11-26 ENCOUNTER — Telehealth: Payer: Self-pay | Admitting: Emergency Medicine

## 2019-11-26 LAB — URINE CULTURE: Culture: >=100000 — AB

## 2019-11-26 MED ORDER — NITROFURANTOIN MONOHYD MACRO 100 MG PO CAPS: 100.0000 mg | ORAL_CAPSULE | Freq: Two times a day (BID) | ORAL | 0 refills | Status: DC

## 2019-11-26 NOTE — Telephone Encounter (Signed)
Per Amy, send macrobid bid x5 days. Patient contacted by phone and made aware of    results. Pt verbalized understanding and had all questions answered.

## 2019-12-01 ENCOUNTER — Other Ambulatory Visit (INDEPENDENT_AMBULATORY_CARE_PROVIDER_SITE_OTHER): Payer: Self-pay | Admitting: Family Medicine

## 2019-12-01 DIAGNOSIS — F3289 Other specified depressive episodes: Secondary | ICD-10-CM

## 2019-12-08 ENCOUNTER — Ambulatory Visit (INDEPENDENT_AMBULATORY_CARE_PROVIDER_SITE_OTHER): Payer: 59 | Admitting: Nurse Practitioner

## 2019-12-08 ENCOUNTER — Encounter: Payer: Self-pay | Admitting: Nurse Practitioner

## 2019-12-08 ENCOUNTER — Other Ambulatory Visit: Payer: Self-pay

## 2019-12-08 ENCOUNTER — Ambulatory Visit
Admission: RE | Admit: 2019-12-08 | Discharge: 2019-12-08 | Disposition: A | Discharge status: Home / Self Care | Payer: 59 | Source: Ambulatory Visit | Attending: Nurse Practitioner | Admitting: Nurse Practitioner

## 2019-12-08 VITALS — BP 110/78 | HR 70 | Temp 98.1°F | Ht 62.8 in | Wt 199.2 lb

## 2019-12-08 DIAGNOSIS — N390 Urinary tract infection, site not specified: Secondary | ICD-10-CM

## 2019-12-08 DIAGNOSIS — R1032 Left lower quadrant pain: Secondary | ICD-10-CM

## 2019-12-08 LAB — POCT URINALYSIS DIPSTICK
Bilirubin, UA: NEGATIVE
Blood, UA: NEGATIVE
Glucose, UA: NEGATIVE
Ketones, UA: NEGATIVE
Leukocytes, UA: NEGATIVE
Nitrite, UA: NEGATIVE
Protein, UA: NEGATIVE
Spec Grav, UA: 1.025 (ref 1.010–1.025)
Urobilinogen, UA: 0.2 E.U./dL
pH, UA: 6 (ref 5.0–8.0)

## 2019-12-08 NOTE — Progress Notes (Signed)
This visit occurred during the SARS-CoV-2 public health emergency.  Safety protocols were in place, including screening questions prior to the visit, additional usage of staff PPE, and extensive cleaning of exam room while observing appropriate contact time as indicated for disinfecting solutions.  Subjective:     Patient ID: Patricia Lin , female    DOB: 1981-02-19 , 38 y.o.   MRN: [id number]   Chief Complaint  Patient presents with  . Abdominal Pain    patient would like an xray. patient stated her nausea has gone away and she is just now having pain.     HPI  She was seen at the Urgent care a week ago and treated for a urinary tract infection.  She has continued to have abdominal pain to follow up.    Abdominal Pain This is a new problem. The current episode started 1 to 4 weeks ago (2 weeks). The onset quality is gradual. The problem occurs intermittently. The problem has been gradually worsening. The pain is located in the LLQ. The quality of the pain is aching. Associated symptoms include constipation (took laxative and antibiotic for urinary tract infection). Pertinent negatives include no dysuria, fever, frequency, headaches, nausea or vomiting. She has tried antibiotics (laxative) for the symptoms. There is no history of abdominal surgery.     Past Medical History:  Diagnosis Date  . Abnormal Pap smear   . Anemia   . Anxiety   . Back pain   . BV (bacterial vaginosis) 2011  . Chlamydia infection   . CIN I (cervical intraepithelial neoplasia I) 2008  . Depression   . Dyspnea   . Dysrhythmia 2009   TACHYCARDIA;   HAD W/U 2010? POSSIBLE HEART VALVE ISSUE; NOT F/U NEEDED X 3 YEARS PER PT  . Dysuria 2010  . Fatigue   . H/O pre-eclampsia in prior pregnancy, currently pregnant   . H/O varicella   . H/O vitamin D deficiency   . Headache(784.0)    MIGRAINES  . HTN (hypertension)   . Hx of breast reduction, elective   . Hx: UTI (urinary tract infection)   . Infection    UTI  DURING PREGNANCY  . Infection    YEAST WITH PREGNANCY  . Infection    CHLAMYDIA X 1  . Knee pain   . 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 Low iron   . PIH (pregnancy induced hypertension) 11-2012   Today pp one month.  B/P nl, and 122/76 on repeat-sitting  . Postpartum depression 2011   NO MEDS  . Pregnancy induced hypertension    ALL PREGNANCIES  . Shortness of breath    WITH EXERTION SICNE PREGNANCY  . Tachycardia   . Tachycardia 2010   HAS HAD WORKUP. UNSURE IF HAS POSSIBLE HEART VALVE ISSUE  . Vulvitis 2009     Family History  Problem Relation Age of Onset  . Hypertension Mother   . Hyperlipidemia Mother   . Coronary artery disease Mother   . Heart disease Mother        high cholesterol  . Asthma Mother   . Diabetes Mother   . Depression Mother   . Anxiety disorder Mother   . Sleep apnea Mother   . Obesity Mother   . Eating disorder Mother   . Hypertension Father   . Hypertension Maternal Grandmother   . Hypertension Maternal Grandfather   . Asthma Daughter   . Alcohol abuse Maternal Uncle   .  Drug abuse Maternal Uncle      Current Outpatient Medications:  .  ferrous sulfate 325 (65 FE) MG tablet, Take 1 tablet (325 mg total) by mouth 3 (three) times daily with meals., Disp: 90 tablet, Rfl: 3 .  ibuprofen (ADVIL) 200 MG tablet, Take 400 mg by mouth every 6 (six) hours as needed for moderate pain., Disp: , Rfl:  .  Vitamin D, Ergocalciferol, (DRISDOL) 1.25 MG (50000 UT) CAPS capsule, Take 1 capsule (50,000 Units total) by mouth every 7 (seven) days., Disp: 4 capsule, Rfl: 0   Allergies  Allergen Reactions  . Latex Itching and Rash     Review of Systems  Constitutional: Negative for diaphoresis, fatigue and fever.  Respiratory: Negative.   Cardiovascular: Negative.   Gastrointestinal: Positive for abdominal pain and constipation (took laxative and antibiotic for urinary tract infection). Negative  for abdominal distention, nausea and vomiting.  Genitourinary: Negative.  Negative for dysuria, flank pain and frequency.       Reports her urine is "yellow"  Neurological: Negative for headaches.  Psychiatric/Behavioral: Negative.      Today's Vitals   12/08/19 1123  BP: 110/78  Pulse: 70  Temp: 98.1 F (36.7 C)  TempSrc: Oral  Weight: 199 lb 3.2 oz (90.4 kg)  Height: 5' 2.8" (1.595 m)  PainSc: 0-No pain   Body mass index is 35.51 kg/m.   Objective:  Physical Exam Constitutional:      Appearance: She is well-developed.  Pulmonary:     Effort: Pulmonary effort is normal. No respiratory distress.     Breath sounds: Normal breath sounds.  Abdominal:     General: Bowel sounds are normal.     Tenderness: There is no abdominal tenderness.  Neurological:     General: No focal deficit present.     Mental Status: She is alert and oriented to person, place, and time.     Cranial Nerves: No cranial nerve deficit.  Psychiatric:        Mood and Affect: Mood normal.        Behavior: Behavior normal.         Assessment And Plan:     1. Left lower quadrant abdominal pain Will check abdominal xray and has mild tenderness to generalized abdomen - DG Abd 2 Views; Future  2. Urinary tract infection without hematuria, site unspecified  She was treated for a uti at an urgent care repeat urinalysis is normal   Arnette Felts, FNP    THE PATIENT IS ENCOURAGED TO PRACTICE SOCIAL DISTANCING DUE TO THE COVID-19 PANDEMIC.

## 2019-12-08 NOTE — Patient Instructions (Signed)

## 2019-12-12 ENCOUNTER — Ambulatory Visit (HOSPITAL_COMMUNITY): Payer: 59 | Admitting: Psychiatry

## 2019-12-12 ENCOUNTER — Other Ambulatory Visit: Payer: Self-pay

## 2019-12-12 ENCOUNTER — Encounter (HOSPITAL_COMMUNITY): Payer: Self-pay | Admitting: Psychiatry

## 2019-12-12 DIAGNOSIS — F411 Generalized anxiety disorder: Secondary | ICD-10-CM

## 2019-12-12 DIAGNOSIS — F330 Major depressive disorder, recurrent, mild: Secondary | ICD-10-CM

## 2019-12-12 NOTE — Progress Notes (Signed)
Virtual Visit via Video Note  I connected with [name] on 12/12/19 at  4:00 PM EST by a video enabled telemedicine application and verified that I am speaking with the correct person using two identifiers.  Location: Patient: Patricia Lin Provider: Lise Auer, LCSW   I discussed the limitations of evaluation and management by telemedicine and the availability of in person appointments. The patient expressed understanding and agreed to proceed.  History of Present Illness: MDD and GAD   Observations/Objective: Counselor met with Patricia Lin for individual therapy via Webex. Counselor assessed MH symptoms and progress on treatment plan goals. Patricia Lin presents with moderate anxiety and moderate depression. Patricia Lin denied suicidal ideation or self-harm behaviors. Patricia Lin shared that she continues to experience marital issues, with attempts to open lines of communication to discuss past traumas impacting there interactions. Counselor assessed delivery and response to efforts in communication, as well as, impact on daily functioning and family life. Patricia Lin discussed increased anxiety with attempts to return to work and increased depression with lack of structure in daily life. Counselor utilized solution focused interventions to identify ways to create more structure in her life to promote healthy sleep hygiene and address weight concerns. Counselor and Patricia Lin discussed future therapy needs, with Patricia Lin having an interest in starting IOP treatment. Counselor reviewed healthy coping skills to apply and implement to address anxiety and depression. Analyse was receptive to feedback, expressing some resistance and lack of motivation.   Assessment and Plan: Counselor will continue to meet with patient to address treatment plan goals. Patient will continue to follow recommendations of providers and implement skills learned in session.  Follow Up Instructions: Counselor will send information for next session  via Webex.     I discussed the assessment and treatment plan with the patient. The patient was provided an opportunity to ask questions and all were answered. The patient agreed with the plan and demonstrated an understanding of the instructions.   The patient was advised to call back or seek an in-person evaluation if the symptoms worsen or if the condition fails to improve as anticipated.  I provided 55 minutes of non-face-to-face time during this encounter.   Lise Auer, LCSW

## 2019-12-13 ENCOUNTER — Encounter: Payer: Self-pay | Admitting: Nurse Practitioner

## 2019-12-14 ENCOUNTER — Other Ambulatory Visit: Payer: Self-pay

## 2019-12-14 ENCOUNTER — Encounter (INDEPENDENT_AMBULATORY_CARE_PROVIDER_SITE_OTHER): Payer: Self-pay | Admitting: Family Medicine

## 2019-12-14 ENCOUNTER — Telehealth (INDEPENDENT_AMBULATORY_CARE_PROVIDER_SITE_OTHER): Payer: Self-pay | Admitting: Family Medicine

## 2019-12-14 DIAGNOSIS — D509 Iron deficiency anemia, unspecified: Secondary | ICD-10-CM

## 2019-12-14 DIAGNOSIS — Z6835 Body mass index (BMI) 35.0-35.9, adult: Secondary | ICD-10-CM

## 2019-12-14 DIAGNOSIS — E559 Vitamin D deficiency, unspecified: Secondary | ICD-10-CM

## 2019-12-14 MED ORDER — FERROUS SULFATE 325 (65 FE) MG PO TABS: 325.0000 mg | ORAL_TABLET | Freq: Three times a day (TID) | ORAL | 3 refills | Status: DC

## 2019-12-14 MED ORDER — VITAMIN D (ERGOCALCIFEROL) 1.25 MG (50000 UNIT) PO CAPS: 50000.0000 [IU] | ORAL_CAPSULE | ORAL | 0 refills | Status: DC

## 2019-12-20 NOTE — Progress Notes (Signed)
TeleHealth Visit:  Due to the COVID-19 pandemic, this visit was completed with telemedicine (audio/video) technology to reduce patient and provider exposure as well as to preserve personal protective equipment.   Patricia Lin has verbally consented to this TeleHealth visit. The patient is located at home, the provider is located at the [facility] office. The participants in this visit include the listed provider and patient. The visit was conducted today via face time.   Chief Complaint: OBESITY Patricia Lin is here to discuss her progress with her obesity treatment plan along with follow-up of her obesity related diagnoses. Patricia Lin is practicing portion control and making smarter food choices, such as increasing vegetables and decreasing simple carbohydrates and states she is following her eating plan approximately 0% of the time. Patricia Lin states she is doing 0 minutes 0 times per week.  Today's visit was #: 23 Starting weight: 191 lbs Starting date: 09/08/18  Interim History: Patricia Lin did some celebration eating over the holidays, but she did well minimizing her weight gain. She is ready to get back on track with her eating plan.  Subjective:   1. Vitamin D deficiency Patricia Lin is stable on Vit D. She has no recent labs but unlikely to be over-replaced. She denies nausea, vomiting, or muscle weakness  2. Iron deficiency anemia, unspecified iron deficiency anemia type Patricia Lin is stable on iron supplementation. She is working on increasing iron in her diet. She has a history of intermittent constipation.  Assessment/Plan:   1. Vitamin D deficiency We will refill prescription Vit D for 1 month- Vitamin D, Ergocalciferol, (DRISDOL) 1.25 MG (50000 UT) CAPS capsule; Take 1 capsule (50,000 Units total) by mouth every 7 (seven) days. Dispense: 4 capsule; Refill: 0. We will recheck labs at her next in office visit, and will continue to monitor.  2. Iron deficiency anemia, unspecified  iron deficiency anemia type We will refill FeSoy for 3 months- ferrous sulfate 325 (65 FE) MG tablet; Take 1 tablet (325 mg total) by mouth 3 (three) times daily with meals. Dispense: 90 tablet; Refill: 3. We will recheck labs at her next in office visit, and will continue to monitor.  3. Class 2 severe obesity with serious comorbidity and body mass index (BMI) of 35.0 to 35.9 in adult, unspecified obesity type (HCC) Patricia Lin is currently in the action stage of change. As such, her goal is to continue with weight loss efforts. She has agreed to on the Category 3 Plan.   We discussed the following exercise goals today: For substantial health benefits, adults should do at least 150 minutes (2 hours and 30 minutes) a week of moderate-intensity, or 75 minutes (1 hour and 15 minutes) a week of vigorous-intensity aerobic physical activity, or an equivalent combination of moderate- and vigorous-intensity aerobic activity. Aerobic activity should be performed in episodes of at least 10 minutes, and preferably, it should be spread throughout the week. Adults should also include muscle-strengthening activities that involve all major muscle groups on 2 or more days a week.  We discussed the following behavioral modification strategies today: meal planning and cooking strategies and emotional eating strategies.  Patricia Lin has agreed to follow-up with our clinic in 3 weeks. She was informed of the importance of frequent follow-up visits to maximize her success with intensive lifestyle modifications for her multiple health conditions.  Objective:   VITALS: Per patient if applicable, see vitals. GENERAL: Alert and in no acute distress. CARDIOPULMONARY: No increased WOB. Speaking in clear sentences.  PSYCH: Pleasant and cooperative. Speech normal rate and rhythm. Affect is appropriate. Insight and judgement are appropriate. Attention is focused, linear, and appropriate.  NEURO: Oriented as arrived to appointment  on time with no prompting.   Lab Results  Component Value Date   CREATININE 0.84 09/04/2019   BUN 19 09/04/2019   NA 139 09/04/2019   K 3.6 09/04/2019   CL 105 09/04/2019   CO2 25 09/04/2019   Lab Results  Component Value Date   ALT 30 09/08/2018   AST 25 09/08/2018   ALKPHOS 78 09/08/2018   BILITOT 0.8 09/08/2018   Lab Results  Component Value Date   HGBA1C 5.2 09/08/2018   Lab Results  Component Value Date   INSULIN 11.5 09/08/2018   Lab Results  Component Value Date   TSH 0.755 09/08/2018   Lab Results  Component Value Date   CHOL 213 (H) 12/17/2018   HDL 71 12/17/2018   LDLCALC 125 (H) 12/17/2018   TRIG 87 12/17/2018   CHOLHDL 3.0 12/17/2018   Lab Results  Component Value Date   WBC 5.1 09/04/2019   HGB 11.1 (L) 09/04/2019   HCT 37.6 09/04/2019   MCV 80.7 09/04/2019   PLT 305 09/04/2019   Lab Results  Component Value Date   IRON 151 09/08/2019   TIBC 324 09/08/2019   FERRITIN 17 09/08/2019    Attestation Statements:   Reviewed by clinician on day of visit: allergies, medications, problem list, medical history, surgical history, family history, social history, and previous encounter notes.    I, Trixie Dredge, am acting as transcriptionist for Dennard Nip, MD.  I have reviewed the above documentation for accuracy and completeness, and I agree with the above. - Dennard Nip, MD

## 2019-12-29 ENCOUNTER — Other Ambulatory Visit: Payer: Self-pay

## 2019-12-29 ENCOUNTER — Encounter (HOSPITAL_COMMUNITY): Payer: Self-pay | Admitting: Psychiatry

## 2019-12-29 ENCOUNTER — Ambulatory Visit (INDEPENDENT_AMBULATORY_CARE_PROVIDER_SITE_OTHER): Payer: Self-pay | Admitting: Psychiatry

## 2019-12-29 DIAGNOSIS — F330 Major depressive disorder, recurrent, mild: Secondary | ICD-10-CM

## 2019-12-29 DIAGNOSIS — F411 Generalized anxiety disorder: Secondary | ICD-10-CM

## 2019-12-29 NOTE — Progress Notes (Signed)
Virtual Visit via Video Note  I connected with Patricia Lin on 12/29/19 at  4:00 PM EST by a video enabled telemedicine application and verified that I am speaking with the correct person using two identifiers.  Location: Patient: Community Provider: Home Office   I discussed the limitations of evaluation and management by telemedicine and the availability of in person appointments. The patient expressed understanding and agreed to proceed.  History of Present Illness: MDD and GAD   Treatment Plan Goals: Pt will improve mood as evidenced by "being happy again"; managing mood and coping with daily stressors for 5 out of 7 days for 60 days.   Observations/Objective: Counselor met with Patricia Lin for individual therapy via Webex. Counselor assessed MH symptoms and progress on treatment plan goals, with patient reporting reduced arguments with partner and increased recreational time with family, causing her more happiness over the past two weeks. Patricia Lin presents with moderate depression and moderate anxiety. Patricia Lin denied suicidal ideation or self-harm behaviors.   Patricia Lin shared that she was unable to have a traditional session today, as she had to make an emergency errand run due to a family need. Counselor assessed daily functioning and current needs. Patricia Lin reported increased activity in and outside of the home, compared to the past two months. Patricia Lin identified that she is providing a lack of structure in her and her families lives and that she would be more successful in personal and treatment plan goals if she could create more structure, routine and predictability. Counselor and Patricia Lin identified a couple strategies in increasing structure in her daily routine. Patricia Lin to report on implementation and outcome of strategies at next session.   Assessment and Plan: Counselor will continue to meet with patient to address treatment plan goals. Patient will continue to follow recommendations of providers  and implement skills learned in session.  Follow Up Instructions: Counselor will send information for next session via Webex.    The patient was advised to call back or seek an in-person evaluation if the symptoms worsen or if the condition fails to improve as anticipated.  I provided 25 minutes of non-face-to-face time during this encounter.   Lise Auer, LCSW

## 2020-01-04 ENCOUNTER — Ambulatory Visit (INDEPENDENT_AMBULATORY_CARE_PROVIDER_SITE_OTHER): Payer: 59 | Admitting: Family Medicine

## 2020-01-16 ENCOUNTER — Encounter (INDEPENDENT_AMBULATORY_CARE_PROVIDER_SITE_OTHER): Payer: Self-pay | Admitting: Physician Assistant

## 2020-01-16 ENCOUNTER — Other Ambulatory Visit: Payer: Self-pay

## 2020-01-16 ENCOUNTER — Telehealth (INDEPENDENT_AMBULATORY_CARE_PROVIDER_SITE_OTHER): Payer: Self-pay | Admitting: Physician Assistant

## 2020-01-16 DIAGNOSIS — E669 Obesity, unspecified: Secondary | ICD-10-CM

## 2020-01-16 DIAGNOSIS — E559 Vitamin D deficiency, unspecified: Secondary | ICD-10-CM

## 2020-01-16 DIAGNOSIS — E7849 Other hyperlipidemia: Secondary | ICD-10-CM

## 2020-01-16 DIAGNOSIS — Z6833 Body mass index (BMI) 33.0-33.9, adult: Secondary | ICD-10-CM

## 2020-01-16 MED ORDER — VITAMIN D (ERGOCALCIFEROL) 1.25 MG (50000 UNIT) PO CAPS: 50000.0000 [IU] | ORAL_CAPSULE | ORAL | 0 refills | Status: DC

## 2020-01-16 NOTE — Progress Notes (Signed)
TeleHealth Visit:  Due to the COVID-19 pandemic, this visit was completed with telemedicine (audio/video) technology to reduce patient and provider exposure as well as to preserve personal protective equipment.   Patricia Lin has verbally consented to this TeleHealth visit. The patient is located at home, the provider is located at the [facility] office. The participants in this visit include the listed provider and patient. The visit was conducted today via Face Time.  Chief Complaint: OBESITY Patricia Lin is here to discuss her progress with her obesity treatment plan along with follow-up of her obesity related diagnoses. Patricia Lin is on the Category 3 Plan and states she is following her eating plan approximately 0% of the time. Patricia Lin states she is exercising for 0 minutes 0 times per week.  Today's visit was #: 24 Starting weight: 191 lbs Starting date: 09/08/2018  Interim History: Patricia Lin reports that she is now at home with her kids and is having trouble developing her new routine.  She is getting up late and sometimes skipping breakfast.  Subjective:   1. Vitamin D deficiency She is currently taking vit D. She denies nausea, vomiting or muscle weakness.  2. Other hyperlipidemia Patricia Lin has hyperlipidemia and has been trying to improve her cholesterol levels with intensive lifestyle modification including a low saturated fat diet, exercise and weight loss. She denies any chest pain.  She is not taking any medications at this time.  Lab Results  Component Value Date   ALT 30 09/08/2018   AST 25 09/08/2018   ALKPHOS 78 09/08/2018   BILITOT 0.8 09/08/2018   Lab Results  Component Value Date   CHOL 213 (H) 12/17/2018   HDL 71 12/17/2018   LDLCALC 125 (H) 12/17/2018   TRIG 87 12/17/2018   CHOLHDL 3.0 12/17/2018   Assessment/Plan:   1. Vitamin D deficiency Low Vitamin D level contributes to fatigue and are associated with obesity, breast, and colon cancer. She agrees to  continue to take prescription Vitamin D @50 ,000 IU every week and will follow-up for routine testing of Vitamin D, at least 2-3 times per year to avoid over-replacement. - Vitamin D, Ergocalciferol, (DRISDOL) 1.25 MG (50000 UNIT) CAPS capsule; Take 1 capsule (50,000 Units total) by mouth every 7 (seven) days.  Dispense: 4 capsule; Refill: 0  2. Other hyperlipidemia Cardiovascular risk and specific lipid/LDL goals reviewed.  We discussed several lifestyle modifications today and Patricia Lin will continue to work on diet, exercise and weight loss efforts. Orders and follow up as documented in patient record.   Counseling Intensive lifestyle modifications are the first line treatment for this issue. . Dietary changes: Increase soluble fiber. Decrease simple carbohydrates. . Exercise changes: Moderate to vigorous-intensity aerobic activity 150 minutes per week if tolerated. . Lipid-lowering medications: see documented in medical record.  3. Class 1 obesity with serious comorbidity and body mass index (BMI) of 33.0 to 33.9 in adult, unspecified obesity type Patricia Lin is currently in the action stage of change. As such, her goal is to continue with weight loss efforts. She has agreed to the Category 3 Plan.   Exercise goals: For substantial health benefits, adults should do at least 150 minutes (2 hours and 30 minutes) a week of moderate-intensity, or 75 minutes (1 hour and 15 minutes) a week of vigorous-intensity aerobic physical activity, or an equivalent combination of moderate- and vigorous-intensity aerobic activity. Aerobic activity should be performed in episodes of at least 10 minutes, and preferably, it should be spread throughout the week. Adults should  also include muscle-strengthening activities that involve all major muscle groups on 2 or more days a week.  Behavioral modification strategies: no skipping meals and meal planning and cooking strategies.  Patricia Lin has agreed to follow-up with our clinic  in 2 weeks. She was informed of the importance of frequent follow-up visits to maximize her success with intensive lifestyle modifications for her multiple health conditions.  Objective:   VITALS: Per patient if applicable, see vitals. GENERAL: Alert and in no acute distress. CARDIOPULMONARY: No increased WOB. Speaking in clear sentences.  PSYCH: Pleasant and cooperative. Speech normal rate and rhythm. Affect is appropriate. Insight and judgement are appropriate. Attention is focused, linear, and appropriate.  NEURO: Oriented as arrived to appointment on time with no prompting.   Lab Results  Component Value Date   CREATININE 0.84 09/04/2019   BUN 19 09/04/2019   NA 139 09/04/2019   K 3.6 09/04/2019   CL 105 09/04/2019   CO2 25 09/04/2019   Lab Results  Component Value Date   ALT 30 09/08/2018   AST 25 09/08/2018   ALKPHOS 78 09/08/2018   BILITOT 0.8 09/08/2018   Lab Results  Component Value Date   HGBA1C 5.2 09/08/2018   Lab Results  Component Value Date   INSULIN 11.5 09/08/2018   Lab Results  Component Value Date   TSH 0.755 09/08/2018   Lab Results  Component Value Date   CHOL 213 (H) 12/17/2018   HDL 71 12/17/2018   LDLCALC 125 (H) 12/17/2018   TRIG 87 12/17/2018   CHOLHDL 3.0 12/17/2018   Lab Results  Component Value Date   WBC 5.1 09/04/2019   HGB 11.1 (L) 09/04/2019   HCT 37.6 09/04/2019   MCV 80.7 09/04/2019   PLT 305 09/04/2019   Lab Results  Component Value Date   IRON 151 09/08/2019   TIBC 324 09/08/2019   FERRITIN 17 09/08/2019   Attestation Statements:   Reviewed by clinician on day of visit: allergies, medications, problem list, medical history, surgical history, family history, social history, and previous encounter notes.  I, Water quality scientist, CMA, am acting as Location manager for [employer], PA-C.  I have reviewed the above documentation for accuracy and completeness, and I agree with the above. Abby Potash, PA-C

## 2020-01-19 ENCOUNTER — Encounter (INDEPENDENT_AMBULATORY_CARE_PROVIDER_SITE_OTHER): Payer: Self-pay

## 2020-01-19 ENCOUNTER — Encounter (INDEPENDENT_AMBULATORY_CARE_PROVIDER_SITE_OTHER): Payer: Self-pay | Admitting: Family Medicine

## 2020-01-30 ENCOUNTER — Other Ambulatory Visit: Payer: Self-pay

## 2020-01-30 ENCOUNTER — Encounter (INDEPENDENT_AMBULATORY_CARE_PROVIDER_SITE_OTHER): Payer: Self-pay | Admitting: Physician Assistant

## 2020-01-30 ENCOUNTER — Telehealth (INDEPENDENT_AMBULATORY_CARE_PROVIDER_SITE_OTHER): Payer: Self-pay | Admitting: Physician Assistant

## 2020-01-30 DIAGNOSIS — E7849 Other hyperlipidemia: Secondary | ICD-10-CM

## 2020-01-30 DIAGNOSIS — Z6835 Body mass index (BMI) 35.0-35.9, adult: Secondary | ICD-10-CM

## 2020-01-30 NOTE — Progress Notes (Signed)
TeleHealth Visit:  Due to the COVID-19 pandemic, this visit was completed with telemedicine (audio/video) technology to reduce patient and provider exposure as well as to preserve personal protective equipment.   Patricia Lin has verbally consented to this TeleHealth visit. The patient is located at home, the provider is located at the [facility] office. The participants in this visit include the listed provider and patient. The visit was conducted today via Face Time.  Chief Complaint: OBESITY Patricia Lin is here to discuss her progress with her obesity treatment plan along with follow-up of her obesity related diagnoses. Patricia Lin is on the Category 3 Plan and states she is following her eating plan approximately 0% of the time. Patricia Lin states she is exercising for 0 minutes 0 times per week.  Today's visit was #: 25 Starting weight: 191 lbs Starting date: 09/08/2018  Interim History: Patricia Lin reports stress eating snacks the last few weeks and not eating meals.  She is at home with her kids, who are all in school virtually.  Subjective:   1. Other hyperlipidemia Patricia Lin has hyperlipidemia and has been trying to improve her cholesterol levels with intensive lifestyle modification including a low saturated fat diet, exercise and weight loss. She denies any chest pain, claudication or myalgias.  She is on no medications for hyperlipidemia.  Last labs not at goal.  Lab Results  Component Value Date   ALT 30 09/08/2018   AST 25 09/08/2018   ALKPHOS 78 09/08/2018   BILITOT 0.8 09/08/2018   Lab Results  Component Value Date   CHOL 213 (H) 12/17/2018   HDL 71 12/17/2018   LDLCALC 125 (H) 12/17/2018   TRIG 87 12/17/2018   CHOLHDL 3.0 12/17/2018   Assessment/Plan:   1. Other hyperlipidemia Cardiovascular risk and specific lipid/LDL goals reviewed.  We discussed several lifestyle modifications today and Patricia Lin will continue to work on diet, exercise and weight loss efforts. Orders  and follow up as documented in patient record.   Counseling Intensive lifestyle modifications are the first line treatment for this issue. . Dietary changes: Increase soluble fiber. Decrease simple carbohydrates. . Exercise changes: Moderate to vigorous-intensity aerobic activity 150 minutes per week if tolerated. . Lipid-lowering medications: see documented in medical record.  2. Class 2 severe obesity with serious comorbidity and body mass index (BMI) of 35.0 to 35.9 in adult, unspecified obesity type (HCC) Patricia Lin is currently in the action stage of change. As such, her goal is to continue with weight loss efforts. She has agreed to the Category 3 Plan.   Exercise goals: For substantial health benefits, adults should do at least 150 minutes (2 hours and 30 minutes) a week of moderate-intensity, or 75 minutes (1 hour and 15 minutes) a week of vigorous-intensity aerobic physical activity, or an equivalent combination of moderate- and vigorous-intensity aerobic activity. Aerobic activity should be performed in episodes of at least 10 minutes, and preferably, it should be spread throughout the week.  Behavioral modification strategies: increasing lean protein intake and no skipping meals.  Patricia Lin has agreed to follow-up with our clinic in 3 weeks. She was informed of the importance of frequent follow-up visits to maximize her success with intensive lifestyle modifications for her multiple health conditions.  Objective:   VITALS: Per patient if applicable, see vitals. GENERAL: Alert and in no acute distress. CARDIOPULMONARY: No increased WOB. Speaking in clear sentences.  PSYCH: Pleasant and cooperative. Speech normal rate and rhythm. Affect is appropriate. Insight and judgement are appropriate. Attention is focused,  linear, and appropriate.  NEURO: Oriented as arrived to appointment on time with no prompting.   Lab Results  Component Value Date   CREATININE 0.84 09/04/2019   BUN 19  09/04/2019   NA 139 09/04/2019   K 3.6 09/04/2019   CL 105 09/04/2019   CO2 25 09/04/2019   Lab Results  Component Value Date   ALT 30 09/08/2018   AST 25 09/08/2018   ALKPHOS 78 09/08/2018   BILITOT 0.8 09/08/2018   Lab Results  Component Value Date   HGBA1C 5.2 09/08/2018   Lab Results  Component Value Date   INSULIN 11.5 09/08/2018   Lab Results  Component Value Date   TSH 0.755 09/08/2018   Lab Results  Component Value Date   CHOL 213 (H) 12/17/2018   HDL 71 12/17/2018   LDLCALC 125 (H) 12/17/2018   TRIG 87 12/17/2018   CHOLHDL 3.0 12/17/2018   Lab Results  Component Value Date   WBC 5.1 09/04/2019   HGB 11.1 (L) 09/04/2019   HCT 37.6 09/04/2019   MCV 80.7 09/04/2019   PLT 305 09/04/2019   Lab Results  Component Value Date   IRON 151 09/08/2019   TIBC 324 09/08/2019   FERRITIN 17 09/08/2019   Attestation Statements:   Reviewed by clinician on day of visit: allergies, medications, problem list, medical history, surgical history, family history, social history, and previous encounter notes.  I, Water quality scientist, CMA, am acting as Location manager for [employer], PA-C.  I have reviewed the above documentation for accuracy and completeness, and I agree with the above. Abby Potash, PA-C

## 2020-02-21 ENCOUNTER — Encounter (INDEPENDENT_AMBULATORY_CARE_PROVIDER_SITE_OTHER): Payer: Self-pay | Admitting: Physician Assistant

## 2020-02-21 ENCOUNTER — Telehealth (INDEPENDENT_AMBULATORY_CARE_PROVIDER_SITE_OTHER): Payer: Self-pay | Admitting: Physician Assistant

## 2020-02-21 ENCOUNTER — Other Ambulatory Visit: Payer: Self-pay

## 2020-02-21 DIAGNOSIS — Z6835 Body mass index (BMI) 35.0-35.9, adult: Secondary | ICD-10-CM

## 2020-02-21 DIAGNOSIS — E559 Vitamin D deficiency, unspecified: Secondary | ICD-10-CM

## 2020-02-21 MED ORDER — VITAMIN D (ERGOCALCIFEROL) 1.25 MG (50000 UNIT) PO CAPS: 50000.0000 [IU] | ORAL_CAPSULE | ORAL | 0 refills | Status: DC

## 2020-02-21 NOTE — Progress Notes (Signed)
TeleHealth Visit:  Due to the COVID-19 pandemic, this visit was completed with telemedicine (audio/video) technology to reduce patient and provider exposure as well as to preserve personal protective equipment.   Patricia Lin has verbally consented to this TeleHealth visit. The patient is located at work, the provider is located at the [facility] office. The participants in this visit include the listed provider and patient. The visit was conducted today via FaceTime.  Chief Complaint: OBESITY Patricia Lin is here to discuss her progress with her obesity treatment plan along with follow-up of her obesity related diagnoses. Patricia Lin is on the Category 3 Plan and states she is following her eating plan approximately 0% of the time. Patricia Lin states she is running in the parking lot with her children 3 times per week.  Today's visit was #: 26 Starting weight: 191 lbs Starting date: 09/08/2018  Interim History: Patricia Lin reports that her weight got up to 200 lbs and so she started drinking smoothies for breakfast and lunch and had salads with meat for dinners.  Subjective:   Vitamin D deficiency. No nausea, vomiting, or muscle weakness. Last Vitamin D level was not at goal - 9.6 on 09/08/2018.  Assessment/Plan:   Vitamin D deficiency. Low Vitamin D level contributes to fatigue and are associated with obesity, breast, and colon cancer. She was given a refill on her Vitamin D, Ergocalciferol, (DRISDOL) 1.25 MG (50000 UNIT) CAPS capsule every week #4 with 0 refills and will follow-up for routine testing of Vitamin D, at least 2-3 times per year to avoid over-replacement.    Class 2 severe obesity with serious comorbidity and body mass index (BMI) of 35.0 to 35.9 in adult, unspecified obesity type ([place]).  Patricia Lin is currently in the action stage of change. As such, her goal is to continue with weight loss efforts. She has agreed to the Category 3 Plan.   The patient was advised that Category  3 gives the correct nutrition and encouraged her to get back on plan.  Exercise goals: For substantial health benefits, adults should do at least 150 minutes (2 hours and 30 minutes) a week of moderate-intensity, or 75 minutes (1 hour and 15 minutes) a week of vigorous-intensity aerobic physical activity, or an equivalent combination of moderate- and vigorous-intensity aerobic activity. Aerobic activity should be performed in episodes of at least 10 minutes, and preferably, it should be spread throughout the week.  Behavioral modification strategies: increasing lean protein intake and meal planning and cooking strategies.  Patricia Lin has agreed to follow-up with our clinic in 2 weeks. She was informed of the importance of frequent follow-up visits to maximize her success with intensive lifestyle modifications for her multiple health conditions.  Objective:   VITALS: Per patient if applicable, see vitals. GENERAL: Alert and in no acute distress. CARDIOPULMONARY: No increased WOB. Speaking in clear sentences.  PSYCH: Pleasant and cooperative. Speech normal rate and rhythm. Affect is appropriate. Insight and judgement are appropriate. Attention is focused, linear, and appropriate.  NEURO: Oriented as arrived to appointment on time with no prompting.   Lab Results  Component Value Date   CREATININE 0.84 09/04/2019   BUN 19 09/04/2019   NA 139 09/04/2019   K 3.6 09/04/2019   CL 105 09/04/2019   CO2 25 09/04/2019   Lab Results  Component Value Date   ALT 30 09/08/2018   AST 25 09/08/2018   ALKPHOS 78 09/08/2018   BILITOT 0.8 09/08/2018   Lab Results  Component Value Date  HGBA1C 5.2 09/08/2018   Lab Results  Component Value Date   INSULIN 11.5 09/08/2018   Lab Results  Component Value Date   TSH 0.755 09/08/2018   Lab Results  Component Value Date   CHOL 213 (H) 12/17/2018   HDL 71 12/17/2018   LDLCALC 125 (H) 12/17/2018   TRIG 87 12/17/2018   CHOLHDL 3.0 12/17/2018   Lab  Results  Component Value Date   WBC 5.1 09/04/2019   HGB 11.1 (L) 09/04/2019   HCT 37.6 09/04/2019   MCV 80.7 09/04/2019   PLT 305 09/04/2019   Lab Results  Component Value Date   IRON 151 09/08/2019   TIBC 324 09/08/2019   FERRITIN 17 09/08/2019    Attestation Statements:   Reviewed by clinician on day of visit: allergies, medications, problem list, medical history, surgical history, family history, social history, and previous encounter notes.  Time spent on visit including pre-visit chart review and post-visit charting and care was 20 minutes.   IMarianna Payment, am acting as transcriptionist for Alois Cliche, PA-C   I have reviewed the above documentation for accuracy and completeness, and I agree with the above. Alois Cliche, PA-C

## 2020-03-06 ENCOUNTER — Encounter (INDEPENDENT_AMBULATORY_CARE_PROVIDER_SITE_OTHER): Payer: Self-pay | Admitting: Physician Assistant

## 2020-03-06 ENCOUNTER — Ambulatory Visit (INDEPENDENT_AMBULATORY_CARE_PROVIDER_SITE_OTHER): Payer: Self-pay | Admitting: Physician Assistant

## 2020-03-06 ENCOUNTER — Other Ambulatory Visit: Payer: Self-pay

## 2020-03-06 VITALS — BP 143/85 | HR 72 | Temp 98.1°F | Ht 62.0 in | Wt 192.0 lb

## 2020-03-06 DIAGNOSIS — Z6835 Body mass index (BMI) 35.0-35.9, adult: Secondary | ICD-10-CM

## 2020-03-06 DIAGNOSIS — E7849 Other hyperlipidemia: Secondary | ICD-10-CM

## 2020-03-07 NOTE — Progress Notes (Signed)
Chief Complaint:   OBESITY Patricia Lin is here to discuss her progress with her obesity treatment plan along with follow-up of her obesity related diagnoses. Patricia Lin is on the Category 3 Plan and states she is following her eating plan approximately 0% of the time. Patricia Lin states she is walking 30 minutes 3-4 times per week.  Today's visit was #: 36 Starting weight: 191 lbs Starting date: 09/08/2018 Today's weight: 192 lbs Today's date: 03/06/2020 Total lbs lost to date: 0 Total lbs lost since last in-office visit: 1  Interim History: Patricia Lin states that she has not been following the plan, but is ready to get back on track. She is starting to work at home once again after being off for a year.  Subjective:   Other hyperlipidemia. Patricia Lin has hyperlipidemia and has been trying to improve her cholesterol levels with intensive lifestyle modification including a low saturated fat diet, exercise and weight loss. She denies any chest pain. Patricia Lin is on no medications. She is exercising regularly.  Lab Results  Component Value Date   ALT 30 09/08/2018   AST 25 09/08/2018   ALKPHOS 78 09/08/2018   BILITOT 0.8 09/08/2018   Lab Results  Component Value Date   CHOL 213 (H) 12/17/2018   HDL 71 12/17/2018   LDLCALC 125 (H) 12/17/2018   TRIG 87 12/17/2018   CHOLHDL 3.0 12/17/2018   Assessment/Plan:   Other hyperlipidemia. Cardiovascular risk and specific lipid/LDL goals reviewed.  We discussed several lifestyle modifications today and [name] will continue to work on diet, exercise and weight loss efforts. Orders and follow up as documented in patient record.   Counseling Intensive lifestyle modifications are the first line treatment for this issue. . Dietary changes: Increase soluble fiber. Decrease simple carbohydrates. . Exercise changes: Moderate to vigorous-intensity aerobic activity 150 minutes per week if tolerated. . Lipid-lowering medications: see documented in medical  record.  Class 2 severe obesity with serious comorbidity and body mass index (BMI) of 35.0 to 35.9 in adult, unspecified obesity type ([place]).  Patricia Lin is currently in the action stage of change. As such, her goal is to continue with weight loss efforts. She has agreed to the Category 3 Plan.   She will have labs at her next visit.  Exercise goals: For substantial health benefits, adults should do at least 150 minutes (2 hours and 30 minutes) a week of moderate-intensity, or 75 minutes (1 hour and 15 minutes) a week of vigorous-intensity aerobic physical activity, or an equivalent combination of moderate- and vigorous-intensity aerobic activity. Aerobic activity should be performed in episodes of at least 10 minutes, and preferably, it should be spread throughout the week.  Behavioral modification strategies: meal planning and cooking strategies and planning for success.  Patricia Lin has agreed to follow-up with our clinic in 2 weeks. She was informed of the importance of frequent follow-up visits to maximize her success with intensive lifestyle modifications for her multiple health conditions.   Objective:   Blood pressure (!) 143/85, pulse 72, temperature 98.1 F (36.7 C), temperature source Oral, height 5\' 2"  (1.575 m), weight 192 lb (87.1 kg), SpO2 99 %. Body mass index is 35.12 kg/m.  General: Cooperative, alert, well developed, in no acute distress. HEENT: Conjunctivae and lids unremarkable. Cardiovascular: Regular rhythm.  Lungs: Normal work of breathing. Neurologic: No focal deficits.   Lab Results  Component Value Date   CREATININE 0.84 09/04/2019   BUN 19 09/04/2019   NA 139 09/04/2019   K 3.6 09/04/2019  CL 105 09/04/2019   CO2 25 09/04/2019   Lab Results  Component Value Date   ALT 30 09/08/2018   AST 25 09/08/2018   ALKPHOS 78 09/08/2018   BILITOT 0.8 09/08/2018   Lab Results  Component Value Date   HGBA1C 5.2 09/08/2018   Lab Results  Component Value Date    INSULIN 11.5 09/08/2018   Lab Results  Component Value Date   TSH 0.755 09/08/2018   Lab Results  Component Value Date   CHOL 213 (H) 12/17/2018   HDL 71 12/17/2018   LDLCALC 125 (H) 12/17/2018   TRIG 87 12/17/2018   CHOLHDL 3.0 12/17/2018   Lab Results  Component Value Date   WBC 5.1 09/04/2019   HGB 11.1 (L) 09/04/2019   HCT 37.6 09/04/2019   MCV 80.7 09/04/2019   PLT 305 09/04/2019   Lab Results  Component Value Date   IRON 151 09/08/2019   TIBC 324 09/08/2019   FERRITIN 17 09/08/2019   Attestation Statements:   Reviewed by clinician on day of visit: allergies, medications, problem list, medical history, surgical history, family history, social history, and previous encounter notes.  Time spent on visit including pre-visit chart review and post-visit charting and care was 20 minutes.   IMarianna Payment, am acting as transcriptionist for Patricia Cliche, PA-C   I have reviewed the above documentation for accuracy and completeness, and I agree with the above. Patricia Cliche, PA-C

## 2020-03-27 ENCOUNTER — Ambulatory Visit (INDEPENDENT_AMBULATORY_CARE_PROVIDER_SITE_OTHER): Payer: BC Managed Care – PPO | Admitting: Physician Assistant

## 2020-04-05 ENCOUNTER — Encounter: Payer: 59 | Admitting: Nurse Practitioner

## 2020-04-10 ENCOUNTER — Ambulatory Visit (INDEPENDENT_AMBULATORY_CARE_PROVIDER_SITE_OTHER): Payer: BC Managed Care – PPO

## 2020-04-10 ENCOUNTER — Encounter (HOSPITAL_COMMUNITY): Payer: Self-pay

## 2020-04-10 ENCOUNTER — Other Ambulatory Visit: Payer: Self-pay

## 2020-04-10 ENCOUNTER — Ambulatory Visit (HOSPITAL_COMMUNITY)
Admission: EM | Admit: 2020-04-10 | Discharge: 2020-04-10 | Disposition: A | Discharge status: Home / Self Care | Payer: BC Managed Care – PPO | Attending: Emergency Medicine | Admitting: Emergency Medicine

## 2020-04-10 DIAGNOSIS — S90852A Superficial foreign body, left foot, initial encounter: Secondary | ICD-10-CM | POA: Diagnosis not present

## 2020-04-10 DIAGNOSIS — M79672 Pain in left foot: Secondary | ICD-10-CM | POA: Diagnosis not present

## 2020-04-10 DIAGNOSIS — S91342A Puncture wound with foreign body, left foot, initial encounter: Secondary | ICD-10-CM

## 2020-04-10 MED ORDER — IBUPROFEN 800 MG PO TABS: 800.0000 mg | ORAL_TABLET | Freq: Three times a day (TID) | ORAL | 0 refills | Status: DC

## 2020-04-10 NOTE — ED Triage Notes (Signed)
Pt states she stepped on a needle in October and thinks a piece of it is still in her left foot. She states she looked at the needle and it looked like it was in one piece. Pt states she has a bump in the same spot that keeps popping up and making her foot swell. Pt limped to triage room.

## 2020-04-10 NOTE — Discharge Instructions (Addendum)
Left foot x-ray show metallic foreign body in the soft tissue Take ibuprofen as needed for pain Follow-up with orthopedic for further evaluation Return or go to ED for worsening of symptoms

## 2020-04-10 NOTE — ED Provider Notes (Addendum)
[place]    CSN: [id number] Arrival date & time: 04/10/20  1828      History   Chief Complaint Chief Complaint  Patient presents with  . Foot Injury    HPI Patricia Lin is a 39 y.o. female.    who presented to the urgent care with a complaint of left foot pain for the past 6 to 7 months.  Patient states she stepped on a needle in October and believes a piece of it is still in her left foot.  States there is a bump under her foot that keep popping up and swell. She describes the pain as constant and achy.  He has tried OTC medications without relief.  Her symptoms are made worse with range of motion.  He denies similar symptoms in the past.  Denies chills, fever, nausea, vomiting, diarrhea.    The history is provided by the patient. No language interpreter was used.  Foot Injury   Past Medical History:  Diagnosis Date  . Abnormal Pap smear   . Anemia   . Anxiety   . Back pain   . BV (bacterial vaginosis) 2011  . Chlamydia infection   . CIN I (cervical intraepithelial neoplasia I) 2008  . Depression   . Dyspnea   . Dysrhythmia 2009   TACHYCARDIA;   HAD W/U 2010? POSSIBLE HEART VALVE ISSUE; NOT F/U NEEDED X 3 YEARS PER PT  . Dysuria 2010  . Fatigue   . H/O pre-eclampsia in prior pregnancy, currently pregnant   . H/O varicella   . H/O vitamin D deficiency   . Headache(784.0)    MIGRAINES  . HTN (hypertension)   . Hx of breast reduction, elective   . Hx: UTI (urinary tract infection)   . Infection    UTI DURING PREGNANCY  . Infection    YEAST WITH PREGNANCY  . Infection    CHLAMYDIA X 1  . Knee pain   . 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 Low iron   . PIH (pregnancy induced hypertension) 11-2012   Today pp one month.  B/P nl, and 122/76 on repeat-sitting  . Postpartum depression 2011   NO MEDS  . Pregnancy induced hypertension    ALL PREGNANCIES  . Shortness of breath    WITH EXERTION  SICNE PREGNANCY  . Tachycardia   . Tachycardia 2010   HAS HAD WORKUP. UNSURE IF HAS POSSIBLE HEART VALVE ISSUE  . Vulvitis 2009    Patient Active Problem List   Diagnosis Date Noted  . Right lower quadrant abdominal pain 01/28/2019  . Generalized headaches 12/17/2018  . Anxiety 12/17/2018  . Anemia 12/05/2017  . S/P tubal ligation 02/09/2013  . Vaginal delivery 01/09/2013  . 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  . Fracture of left ulna 09/27/12 10/23/2012  . 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 Symptomatic anemia 09/03/2012  . H/O pre-eclampsia in prior pregnancy, currently pregnant 08/19/2012  . Chronic hypertension in pregnancy 08/19/2012  . Hx LEEP (loop electrosurgical excision procedure), cervix, pregnancy 08/19/2012  . Hx of postpartum depression, currently pregnant 08/19/2012  . Late prenatal care 08/19/2012  . Hx of precipitous labor and deliveries, antepartum 08/19/2012  . Muscle tension HAs 08/19/2012    Past Surgical History:  Procedure Laterality Date  . BREAST REDUCTION SURGERY  2008  . BREAST SURGERY    . DILATION AND CURETTAGE OF UTERUS    . FINGER SURGERY  Right index finger  . TUBAL LIGATION  01/09/2013   Procedure: POST PARTUM TUBAL LIGATION;  Surgeon: Michael Litter, MD;  Location: [facility];  Service: Gynecology;  Laterality: Bilateral;  post partum tubal ligation bilateral  . US ECHOCARDIOGRAPHY  06/18/2009   ef 55-60%  . WISDOM TOOTH EXTRACTION      OB History    Gravida  7   Para  4   Term  4   Preterm      AB  3   Living  4     SAB  1   TAB  2   Ectopic      Multiple      Live Births  4        Obstetric Comments  2011 PRECLAMPSIA PP; IN ICU X 3 DAYS         Home Medications    Prior to Admission medications   Medication Sig Start Date End Date Taking? Authorizing Provider  ferrous sulfate 325 (65 FE) MG tablet Take 1 tablet (325 mg total) by mouth 3 (three) times daily with meals. 12/14/19   Quillian Quince D, MD  ibuprofen (ADVIL) 200 MG tablet Take 400 mg by mouth every 6 (six) hours as needed for moderate pain.    [provider]  Vitamin D, Ergocalciferol, (DRISDOL) 1.25 MG (50000 UNIT) CAPS capsule Take 1 capsule (50,000 Units total) by mouth every 7 (seven) days. 02/21/20   Alois Cliche, PA-C    Family History Family History  Problem Relation Age of Onset  . Hypertension Mother   . Hyperlipidemia Mother   . Coronary artery disease Mother   . Heart disease Mother        high cholesterol  . Asthma Mother   . Diabetes Mother   . Depression Mother   . Anxiety disorder Mother   . Sleep apnea Mother   . Obesity Mother   . Eating disorder Mother   . Hypertension Father   . Hypertension Maternal Grandmother   . Hypertension Maternal Grandfather   . Asthma Daughter   . Alcohol abuse Maternal Uncle   . Drug abuse Maternal Uncle     Social History Social History   Tobacco Use  . Smoking status: Never Smoker  . Smokeless tobacco: Never Used  Substance Use Topics  . Alcohol use: Yes    Comment: socially  . Drug use: No     Allergies   Latex   Review of Systems Review of Systems  Constitutional: Negative.   Respiratory: Negative.   Cardiovascular: Negative.   Musculoskeletal:       Foot pain  All other systems reviewed and are negative.    Physical Exam Triage Vital Signs ED Triage Vitals  Enc Vitals Group     BP 04/10/20 1848 (!) 143/82     Pulse Rate 04/10/20 1848 74     Resp 04/10/20 1848 16     Temp 04/10/20 1848 98.6 F (37 C)     Temp Source 04/10/20 1848 Oral     SpO2 04/10/20 1848 100 %     Weight 04/10/20 1850 190 lb (86.2 kg)     Height 04/10/20 1850 5\' 2"  (1.575 m)     Head Circumference --      Peak Flow --      Pain Score 04/10/20 1850 10     Pain Loc --      Pain Edu? --      Excl. in GC? --  No data found.  Updated Vital Signs BP (!) 143/82   Pulse 74   Temp 98.6 F (37 C) (Oral)   Resp 16   Ht 5\' 2"  (1.575 m)    Wt 190 lb (86.2 kg)   LMP 03/27/2020 (Exact Date)   SpO2 100%   BMI 34.75 kg/m   Visual Acuity Right Eye Distance:   Left Eye Distance:   Bilateral Distance:    Right Eye Near:   Left Eye Near:    Bilateral Near:     Physical Exam Vitals and nursing note reviewed.  Constitutional:      General: She is not in acute distress.    Appearance: Normal appearance. She is normal weight. She is not ill-appearing, toxic-appearing or diaphoretic.  Cardiovascular:     Rate and Rhythm: Normal rate and regular rhythm.     Pulses: Normal pulses.     Heart sounds: Normal heart sounds. No murmur. No friction rub. No gallop.   Pulmonary:     Effort: Pulmonary effort is normal. No respiratory distress.     Breath sounds: Normal breath sounds. No stridor. No wheezing, rhonchi or rales.  Chest:     Chest wall: No tenderness.  Musculoskeletal:        General: Tenderness present.     Right foot: Normal.     Left foot: Swelling present.     Comments: Swelling area under left foot with excruciating pain at 10 while walking.  Neurological:     Mental Status: She is alert and oriented to person, place, and time.     Sensory: No sensory deficit.      UC Treatments / Results  Labs (all labs ordered are listed, but only abnormal results are displayed) Labs Reviewed - No data to display  EKG   Radiology DG Foot Complete Left  Result Date: 04/10/2020 CLINICAL DATA:  Foreign body, stepped on sewing needle EXAM: LEFT FOOT - COMPLETE 3+ VIEW COMPARISON:  None. FINDINGS: There is no evidence of fracture or dislocation. There is no evidence of arthropathy or other focal bone abnormality. There is a 9 mm linear metallic foreign body in the soft tissues between the second and third metatarsophalangeal joints. IMPRESSION: There is a 9 mm linear metallic foreign body in the soft tissues between the second and third metatarsophalangeal joints, consistent with needle. Electronically Signed   By: 06/10/2020 M.D.   On: 04/10/2020 19:47    Procedures Procedures (including critical care time)  Medications Ordered in UC Medications - No data to display  Initial Impression / Assessment and Plan / UC Course  I have reviewed the triage vital signs and the nursing notes.  Pertinent labs & imaging results that were available during my care of the patient were reviewed by me and considered in my medical decision making (see chart for details).   Patient is stable for discharge.  X-ray is positive for for metallic foreign body in soft tissue between the second and third metatarsophalangeal joint.  I have reviewed the x-ray myself and the radiologist interpretation.  I am in agreement with the radiologist interpretation.  Patient will be refer to orthopedic for further evaluation. Final Clinical Impressions(s) / UC Diagnoses   Final diagnoses:  Foreign body in left foot, initial encounter  Left foot pain     Discharge Instructions     Left foot x-ray show metallic foreign body in the soft tissue Take ibuprofen as needed for pain Follow-up with orthopedic for further  evaluation Return or go to ED for worsening of symptoms    ED Prescriptions    None     PDMP not reviewed this encounter.     Durward Parcel, FNP 04/10/20 [phone]

## 2020-11-04 IMAGING — US US PELVIS COMPLETE WITH TRANSVAGINAL
1 series · 14 of 25 positions shown · non-contrast
Comparison: None

CLINICAL DATA: Painful intercourse

EXAM:
TRANSABDOMINAL AND TRANSVAGINAL ULTRASOUND OF PELVIS
TECHNIQUE: Both transabdominal and transvaginal ultrasound examinations of the
pelvis were performed. Transabdominal technique was performed for
global imaging of the pelvis including uterus, ovaries, adnexal
regions, and pelvic cul-de-sac. It was necessary to proceed with
endovaginal exam following the transabdominal exam to visualize the
uterus endometrium ovaries.

[Series 1: us pelvis complete with transvaginal · 0.25mm/px · 14 of 60 slices shown]
[im 1/60]
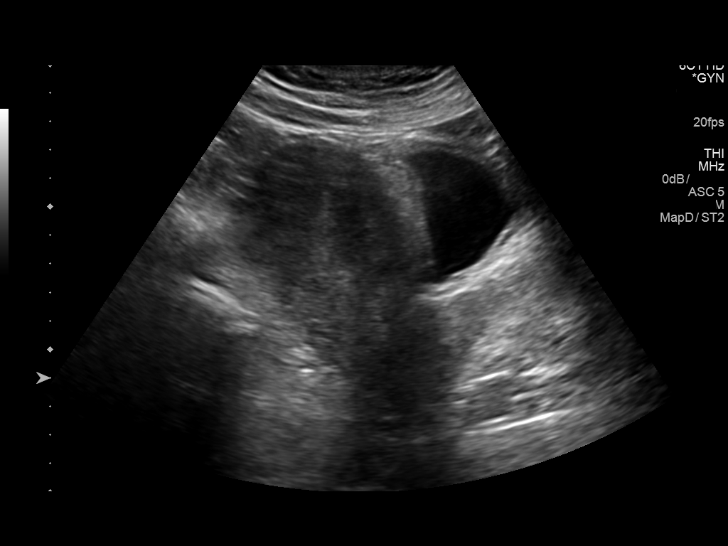
[im 5/60]
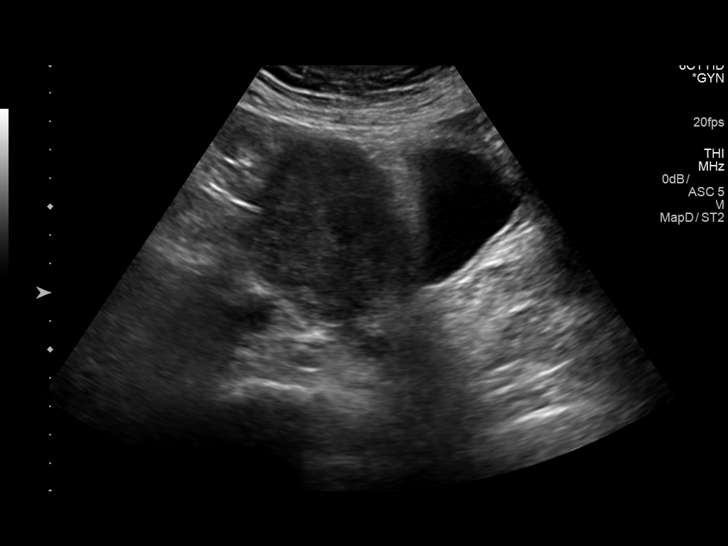
[im 10/60]
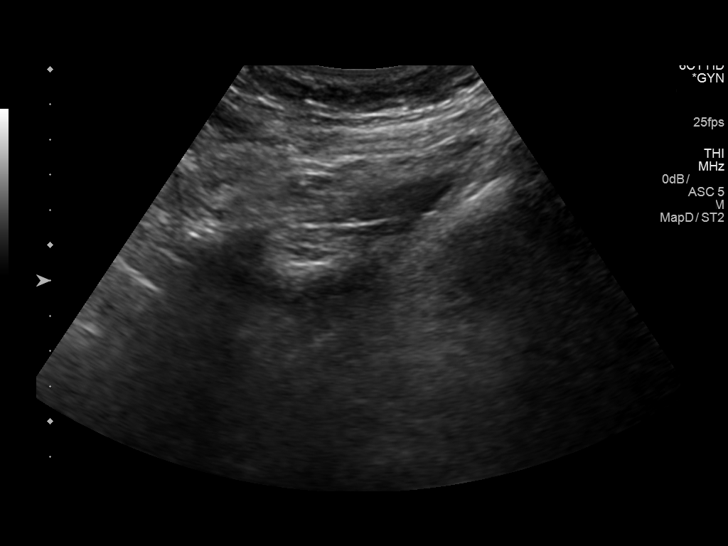
[im 15/60]
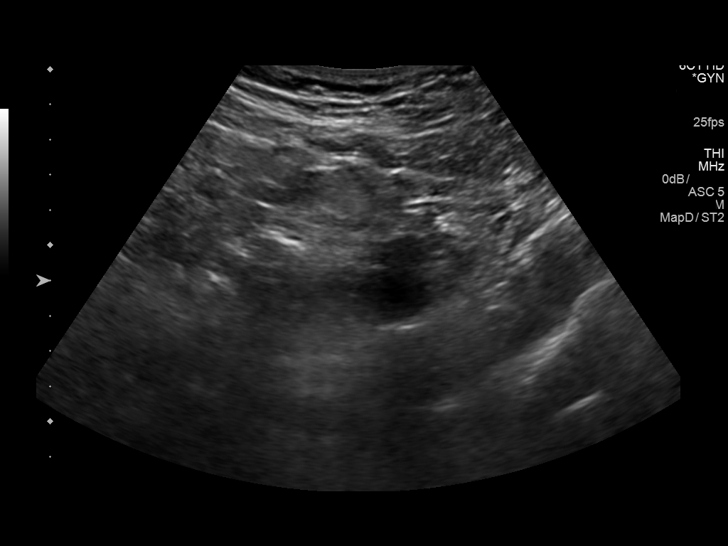
[im 20/60]
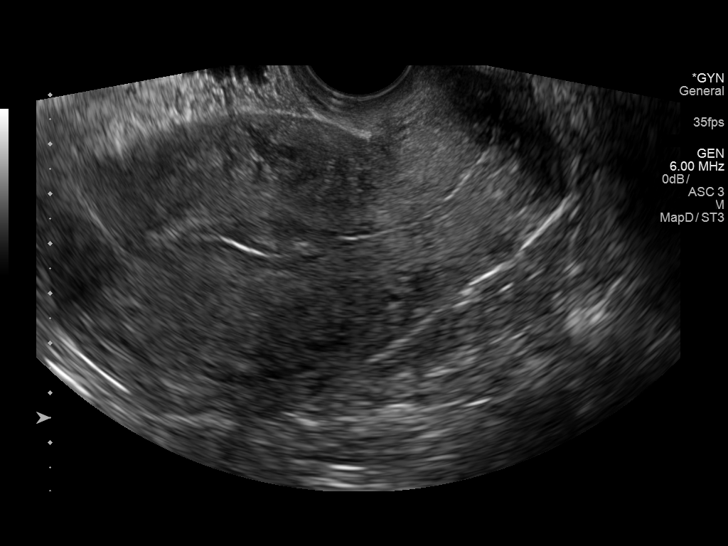
[im 23/60]
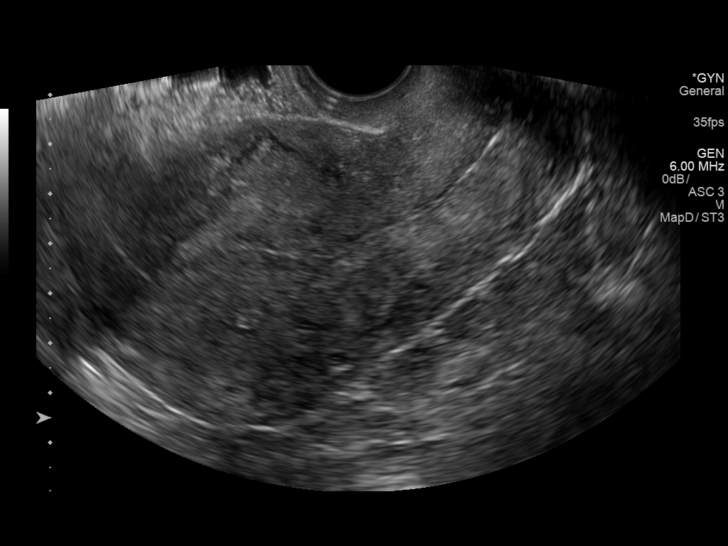
[im 28/60]
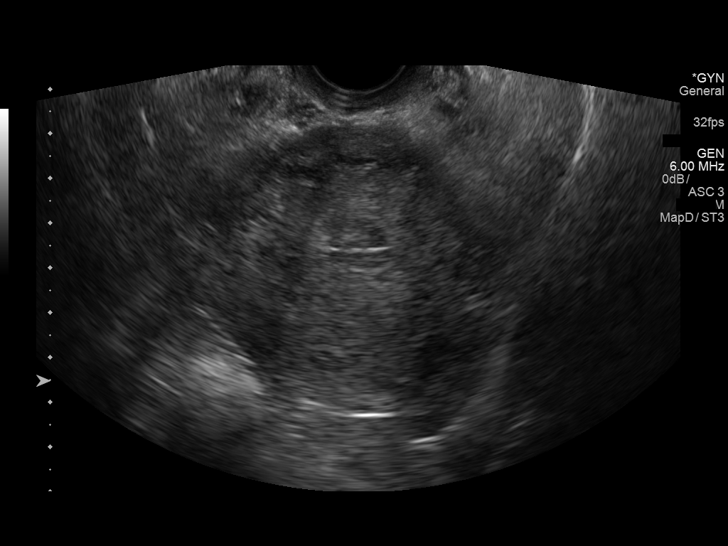
[im 32/60]
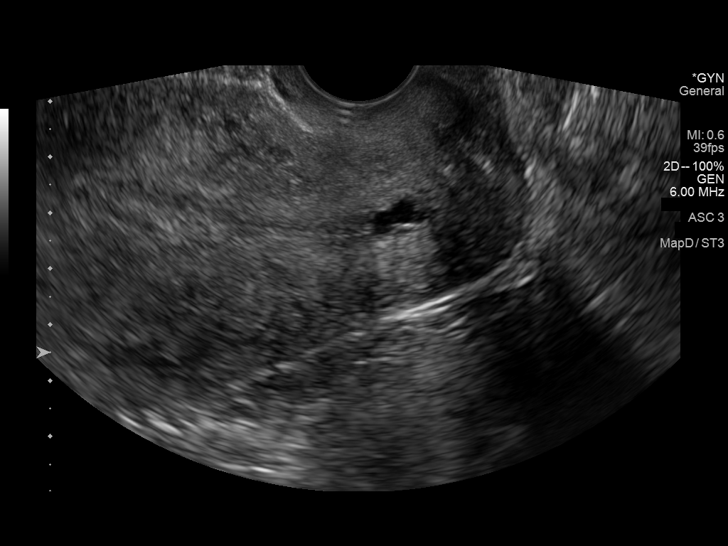
[im 37/60]
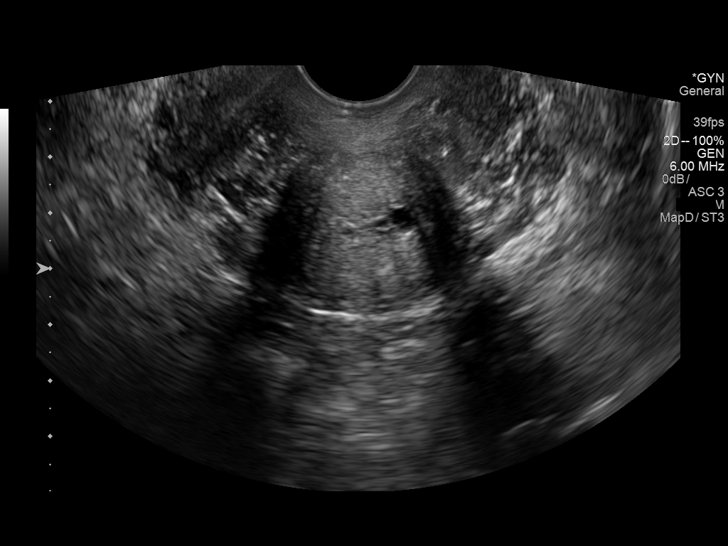
[im 40/60]
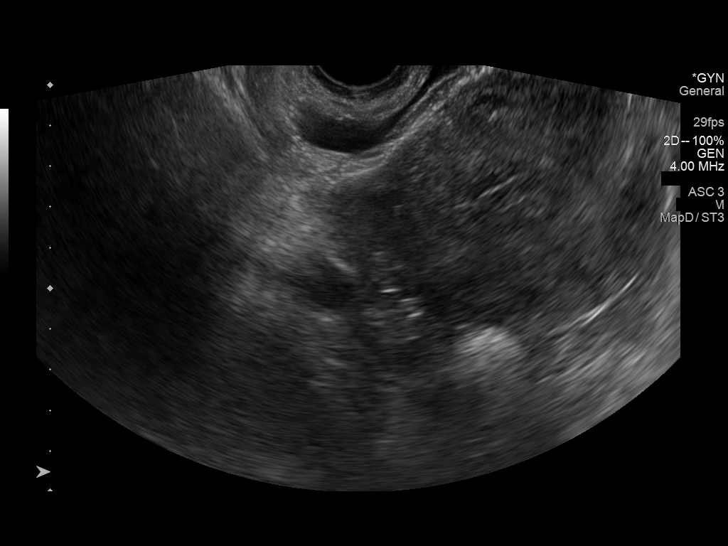
[im 45/60]
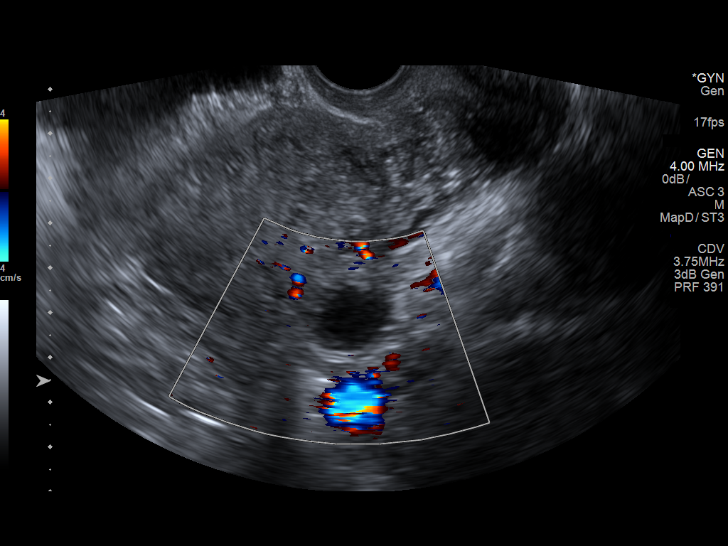
[im 50/60]
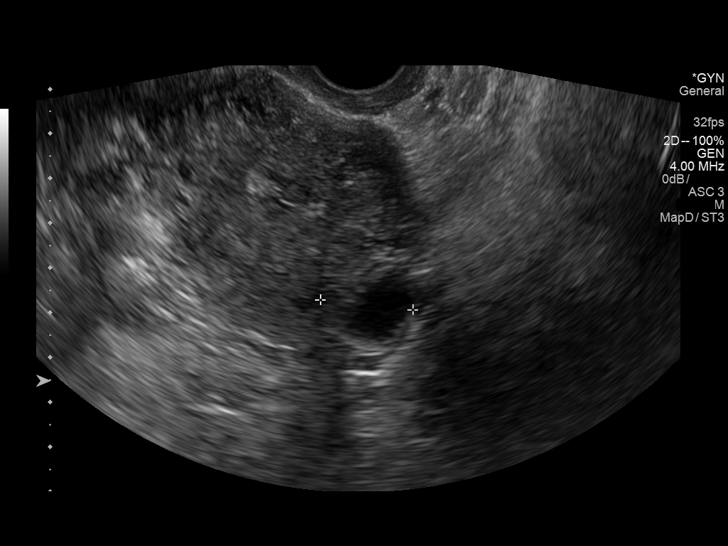
[im 55/60]
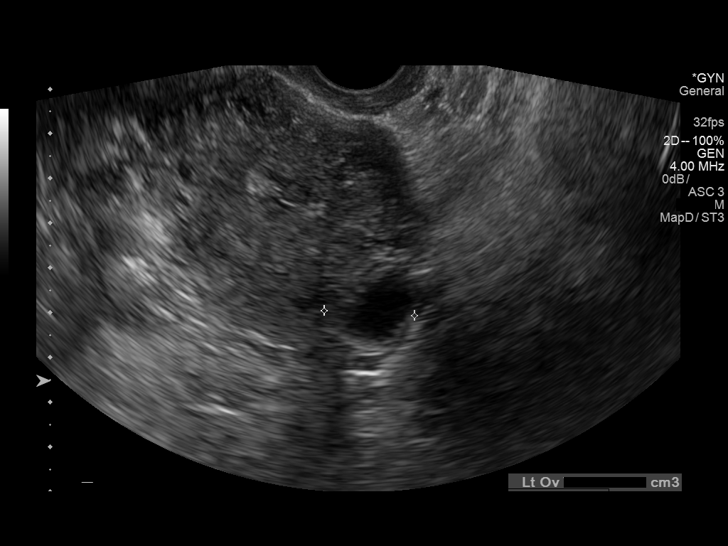
[im 60/60]
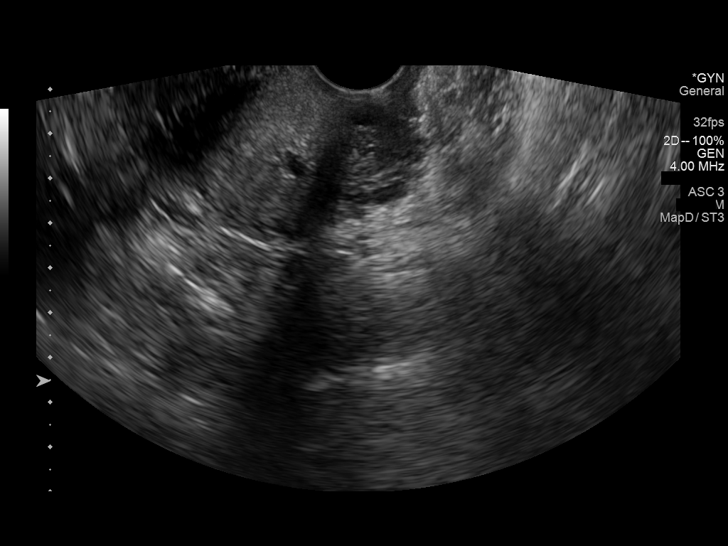

[14 of 25 positions shown; findings below may reference images not displayed]

FINDINGS: Uterus

Measurements: 10.6 x 5.8 x 5.8 cm = volume: 186.6 mL. Nabothian cyst
in the cervix.

Endometrium

Thickness: 10 mm.  No focal abnormality visualized.

Right ovary

Not seen

Left ovary

Measurements: 4 x 1.8 x 2.1 cm = volume: 6.5 mL. Normal
appearance/no adnexal mass.

Other findings

No abnormal free fluid.
IMPRESSION: Nonvisualized right ovary.  Otherwise negative pelvic ultrasound

## 2020-11-20 ENCOUNTER — Ambulatory Visit
Admission: EM | Admit: 2020-11-20 | Discharge: 2020-11-20 | Disposition: A | Discharge status: Home / Self Care | Payer: No Typology Code available for payment source | Attending: Emergency Medicine | Admitting: Emergency Medicine

## 2020-11-20 ENCOUNTER — Encounter: Payer: Self-pay | Admitting: Emergency Medicine

## 2020-11-20 DIAGNOSIS — J069 Acute upper respiratory infection, unspecified: Secondary | ICD-10-CM

## 2020-11-20 DIAGNOSIS — Z20822 Contact with and (suspected) exposure to covid-19: Secondary | ICD-10-CM

## 2020-11-20 MED ORDER — BENZONATATE 100 MG PO CAPS: 100.0000 mg | ORAL_CAPSULE | Freq: Three times a day (TID) | ORAL | 0 refills | Status: DC

## 2020-11-20 MED ORDER — FLUTICASONE PROPIONATE 50 MCG/ACT NA SUSP: 1.0000 | Freq: Every day | NASAL | 0 refills | Status: DC

## 2020-11-20 MED ORDER — CETIRIZINE HCL 10 MG PO TABS: 10.0000 mg | ORAL_TABLET | Freq: Every day | ORAL | 0 refills | Status: DC

## 2020-11-20 NOTE — ED Provider Notes (Signed)
[facility]    CSN: [id number] Arrival date & time: 11/20/20  1656      History   Chief Complaint Chief Complaint  Patient presents with  . Cough    HPI Patricia Lin is a 39 y.o. female  With history as below presenting for URI symptoms since yesterday.  Took 2 home Covid test that were negative.  No known sick contacts, though states since she developed symptoms her daughters have as well.  Endorsing dry cough, nasal congestion, frontal headache.  Not take anything for this.  Past Medical History:  Diagnosis Date  . Abnormal Pap smear   . Anemia   . Anxiety   . Back pain   . BV (bacterial vaginosis) 2011  . Chlamydia infection   . CIN I (cervical intraepithelial neoplasia I) 2008  . Depression   . Dyspnea   . Dysrhythmia 2009   TACHYCARDIA;   HAD W/U 2010? POSSIBLE HEART VALVE ISSUE; NOT F/U NEEDED X 3 YEARS PER PT  . Dysuria 2010  . Fatigue   . H/O pre-eclampsia in prior pregnancy, currently pregnant   . H/O varicella   . H/O vitamin D deficiency   . Headache(784.0)    MIGRAINES  . HTN (hypertension)   . Hx of breast reduction, elective   . Hx: UTI (urinary tract infection)   . Infection    UTI DURING PREGNANCY  . Infection    YEAST WITH PREGNANCY  . Infection    CHLAMYDIA X 1  . Knee pain   . 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 Low iron   . PIH (pregnancy induced hypertension) 11-2012   Today pp one month.  B/P nl, and 122/76 on repeat-sitting  . Postpartum depression 2011   NO MEDS  . Pregnancy induced hypertension    ALL PREGNANCIES  . Shortness of breath    WITH EXERTION SICNE PREGNANCY  . Tachycardia   . Tachycardia 2010   HAS HAD WORKUP. UNSURE IF HAS POSSIBLE HEART VALVE ISSUE  . Vulvitis 2009    Patient Active Problem List   Diagnosis Date Noted  . Right lower quadrant abdominal pain 01/28/2019  . Generalized headaches 12/17/2018  . Anxiety 12/17/2018  . Anemia  12/05/2017  . S/P tubal ligation 02/09/2013  . Vaginal delivery 01/09/2013  . 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  . Fracture of left ulna 09/27/12 10/23/2012  . 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 Symptomatic anemia 09/03/2012  . H/O pre-eclampsia in prior pregnancy, currently pregnant 08/19/2012  . Chronic hypertension in pregnancy 08/19/2012  . Hx LEEP (loop electrosurgical excision procedure), cervix, pregnancy 08/19/2012  . Hx of postpartum depression, currently pregnant 08/19/2012  . Late prenatal care 08/19/2012  . Hx of precipitous labor and deliveries, antepartum 08/19/2012  . Muscle tension HAs 08/19/2012    Past Surgical History:  Procedure Laterality Date  . BREAST REDUCTION SURGERY  2008  . BREAST SURGERY    . DILATION AND CURETTAGE OF UTERUS    . FINGER SURGERY     Right index finger  . TUBAL LIGATION  01/09/2013   Procedure: POST PARTUM TUBAL LIGATION;  Surgeon: Michael LitterNaima A Dillard, MD;  Location: [facility];  Service: Gynecology;  Laterality: Bilateral;  post partum tubal ligation bilateral  . US ECHOCARDIOGRAPHY  06/18/2009   ef 55-60%  . WISDOM TOOTH EXTRACTION      OB History    Gravida  7   Para  4   Term  4   Preterm      AB  3   Living  4     SAB  1   IAB  2   Ectopic      Multiple      Live Births  4        Obstetric Comments  2011 PRECLAMPSIA PP; IN ICU X 3 DAYS         Home Medications    Prior to Admission medications   Medication Sig Start Date End Date Taking? Authorizing Provider  benzonatate (TESSALON) 100 MG capsule Take 1 capsule (100 mg total) by mouth every 8 (eight) hours. 11/20/20   Hall-Potvin, [place], PA-C  cetirizine (ZYRTEC ALLERGY) 10 MG tablet Take 1 tablet (10 mg total) by mouth daily. 11/20/20   Hall-Potvin, [place], PA-C  ferrous sulfate 325 (65 FE) MG tablet Take 1 tablet (325 mg total) by mouth 3 (three) times daily with meals. 12/14/19   Quillian Quince D, MD  fluticasone (FLONASE) 50  MCG/ACT nasal spray Place 1 spray into both nostrils daily. 11/20/20   Hall-Potvin, [place], PA-C  ibuprofen (ADVIL) 800 MG tablet Take 1 tablet (800 mg total) by mouth 3 (three) times daily. 04/10/20   Avegno, Zachery Dakins, FNP  Vitamin D, Ergocalciferol, (DRISDOL) 1.25 MG (50000 UNIT) CAPS capsule Take 1 capsule (50,000 Units total) by mouth every 7 (seven) days. 02/21/20   Alois Cliche, PA-C    Family History Family History  Problem Relation Age of Onset  . Hypertension Mother   . Hyperlipidemia Mother   . Coronary artery disease Mother   . Heart disease Mother        high cholesterol  . Asthma Mother   . Diabetes Mother   . Depression Mother   . Anxiety disorder Mother   . Sleep apnea Mother   . Obesity Mother   . Eating disorder Mother   . Hypertension Father   . Hypertension Maternal Grandmother   . Hypertension Maternal Grandfather   . Asthma Daughter   . Alcohol abuse Maternal Uncle   . Drug abuse Maternal Uncle     Social History Social History   Tobacco Use  . Smoking status: Never Smoker  . Smokeless tobacco: Never Used  Vaping Use  . Vaping Use: Never used  Substance Use Topics  . Alcohol use: Yes    Comment: socially  . Drug use: No     Allergies   Latex   Review of Systems Review of Systems  Constitutional: Negative for fatigue and fever.  HENT: Positive for congestion. Negative for dental problem, ear pain, facial swelling, hearing loss, sinus pain, sore throat, trouble swallowing and voice change.   Eyes: Negative for photophobia, pain and visual disturbance.  Respiratory: Positive for cough. Negative for shortness of breath.   Cardiovascular: Negative for chest pain and palpitations.  Gastrointestinal: Negative for diarrhea and vomiting.  Musculoskeletal: Negative for arthralgias and myalgias.  Neurological: Positive for headaches. Negative for dizziness.     Physical Exam Triage Vital Signs ED Triage Vitals [11/20/20 1925]  Enc Vitals  Group     BP 130/85     Pulse Rate 83     Resp 20     Temp 98.2 F (36.8 C)     Temp Source Oral     SpO2 100 %     Weight      Height      Head Circumference      Peak  Flow      Pain Score      Pain Loc      Pain Edu?      Excl. in GC?    No data found.  Updated Vital Signs BP 130/85 (BP Location: Left Arm)   Pulse 83   Temp 98.2 F (36.8 C) (Oral)   Resp 20   SpO2 100%   Visual Acuity Right Eye Distance:   Left Eye Distance:   Bilateral Distance:    Right Eye Near:   Left Eye Near:    Bilateral Near:     Physical Exam Constitutional:      General: She is not in acute distress.    Appearance: She is not ill-appearing or diaphoretic.  HENT:     Head: Normocephalic and atraumatic.     Right Ear: Tympanic membrane and ear canal normal.     Left Ear: Tympanic membrane and ear canal normal.     Mouth/Throat:     Mouth: Mucous membranes are moist.     Pharynx: Oropharynx is clear. No oropharyngeal exudate or posterior oropharyngeal erythema.  Eyes:     General: No scleral icterus.    Conjunctiva/sclera: Conjunctivae normal.     Pupils: Pupils are equal, round, and reactive to light.  Neck:     Comments: Trachea midline, negative JVD Cardiovascular:     Rate and Rhythm: Normal rate and regular rhythm.     Heart sounds: No murmur heard. No gallop.   Pulmonary:     Effort: Pulmonary effort is normal. No respiratory distress.     Breath sounds: No wheezing, rhonchi or rales.  Musculoskeletal:     Cervical back: Neck supple. No tenderness.  Lymphadenopathy:     Cervical: No cervical adenopathy.  Skin:    Capillary Refill: Capillary refill takes less than 2 seconds.     Coloration: Skin is not jaundiced or pale.     Findings: No rash.  Neurological:     General: No focal deficit present.     Mental Status: She is alert and oriented to person, place, and time.      UC Treatments / Results  Labs (all labs ordered are listed, but only abnormal results  are displayed) Labs Reviewed  NOVEL CORONAVIRUS, NAA    EKG   Radiology No results found.  Procedures Procedures (including critical care time)  Medications Ordered in UC Medications - No data to display  Initial Impression / Assessment and Plan / UC Course  I have reviewed the triage vital signs and the nursing notes.  Pertinent labs & imaging results that were available during my care of the patient were reviewed by me and considered in my medical decision making (see chart for details).     Patient afebrile, nontoxic, with SpO2 100%.  Covid PCR pending.  Patient to quarantine until results are back.  We will treat supportively as outlined below.  Return precautions discussed, patient verbalized understanding and is agreeable to plan. Final Clinical Impressions(s) / UC Diagnoses   Final diagnoses:  URI with cough and congestion  Encounter for screening laboratory testing for COVID-19 virus     Discharge Instructions     Tessalon for cough. Start flonase, atrovent nasal spray for nasal congestion/drainage. You can use over the counter nasal saline rinse such as neti pot for nasal congestion. Keep hydrated, your urine should be clear to pale yellow in color. Tylenol/motrin for fever and pain. Monitor for any worsening of symptoms, chest pain, shortness  of breath, wheezing, swelling of the throat, go to the emergency department for further evaluation needed.     ED Prescriptions    Medication Sig Dispense Auth. Provider   cetirizine (ZYRTEC ALLERGY) 10 MG tablet Take 1 tablet (10 mg total) by mouth daily. 30 tablet Hall-Potvin, [place], PA-C   fluticasone (FLONASE) 50 MCG/ACT nasal spray Place 1 spray into both nostrils daily. 16 g Hall-Potvin, [place], PA-C   benzonatate (TESSALON) 100 MG capsule Take 1 capsule (100 mg total) by mouth every 8 (eight) hours. 21 capsule Hall-Potvin, [place], PA-C     PDMP not reviewed this encounter.   Odette Fraction [place],  [place] 11/20/20 1954

## 2020-11-20 NOTE — ED Triage Notes (Signed)
Pt c/o cough, congestion and headache since yesterday.

## 2020-11-20 NOTE — Discharge Instructions (Addendum)

## 2020-11-22 LAB — SARS-COV-2, NAA 2 DAY TAT

## 2020-11-22 LAB — NOVEL CORONAVIRUS, NAA: SARS-CoV-2, NAA: NOT DETECTED

## 2021-06-22 ENCOUNTER — Other Ambulatory Visit (HOSPITAL_COMMUNITY): Payer: Self-pay

## 2021-06-22 ENCOUNTER — Telehealth: Payer: No Typology Code available for payment source | Admitting: Physician Assistant

## 2021-06-22 ENCOUNTER — Encounter: Payer: Self-pay | Admitting: Physician Assistant

## 2021-06-22 DIAGNOSIS — R0981 Nasal congestion: Secondary | ICD-10-CM | POA: Diagnosis not present

## 2021-06-22 DIAGNOSIS — U071 COVID-19: Secondary | ICD-10-CM

## 2021-06-22 DIAGNOSIS — R0600 Dyspnea, unspecified: Secondary | ICD-10-CM | POA: Diagnosis not present

## 2021-06-22 MED ORDER — MOLNUPIRAVIR EUA 200MG CAPSULE
4.0000 | ORAL_CAPSULE | Freq: Two times a day (BID) | ORAL | 0 refills | Status: AC
  Filled 2021-06-22: qty 40, 5d supply, fill #0

## 2021-06-22 MED ORDER — BENZONATATE 100 MG PO CAPS
100.0000 mg | ORAL_CAPSULE | Freq: Two times a day (BID) | ORAL | 0 refills | Status: DC | PRN
  Filled 2021-06-22: qty 20, 10d supply, fill #0

## 2021-06-22 MED ORDER — ALBUTEROL SULFATE HFA 108 (90 BASE) MCG/ACT IN AERS
2.0000 | INHALATION_SPRAY | Freq: Four times a day (QID) | RESPIRATORY_TRACT | 0 refills | Status: DC | PRN
  Filled 2021-06-22: qty 8.5, 25d supply, fill #0

## 2021-06-22 MED ORDER — FLUTICASONE PROPIONATE 50 MCG/ACT NA SUSP
2.0000 | Freq: Every day | NASAL | 6 refills | Status: DC
  Filled 2021-06-22: qty 16, 30d supply, fill #0

## 2021-06-22 NOTE — Progress Notes (Signed)
Virtual Visit via Video Note  I connected with Patricia Lin on 06/22/21 at  2:00 PM EDT by a video enabled telemedicine application and verified that I am speaking with the correct person using two identifiers.  Location: Patient: pt's home Provider: provider's office   I discussed the limitations of evaluation and management by telemedicine and the availability of in person appointments. The patient expressed understanding and agreed to proceed.    I discussed the assessment and treatment plan with the patient. The patient was provided an opportunity to ask questions and all were answered. The patient agreed with the plan and demonstrated an understanding of the instructions.   The patient was advised to call back or seek an in-person evaluation if the symptoms worsen or if the condition fails to improve as anticipated.  I provided 15 minutes of non-face-to-face time during this encounter.   Demetrio Lapping, PA-C   Acute Office Visit  Subjective:    Patient ID: Patricia Lin, female    DOB: [birth year], 40 y.o.   MRN: [id number]  No chief complaint on file.   40 Diarrhea and nasal congestion, runny nose, body aches, cough, sore throat, x 4 days. Covid test was positive 4 days ago. 2 episodes of diarrhea in past 24 hours.Dyspnea is mild, on and off, and is only when she is going up stairs. Denies any dyspnea at rest. Has been taking robitussin, Dayquil, motrin with some relief of symptoms. Denies any hx of kidney disease and is not on statins.   Shortness of Breath Associated symptoms include rhinorrhea and a sore throat. Pertinent negatives include no abdominal pain, chest pain, claudication, coryza, ear pain, fever, headaches, hemoptysis, leg pain, leg swelling, neck pain, orthopnea, PND, rash, sputum production, swollen glands, syncope, vomiting or wheezing.  Patient is in today for tested pos for Cvoid 19 and needs treatment  Past Medical History:  Diagnosis Date   Abnormal Pap  smear    Anemia    Anxiety    Back pain    BV (bacterial vaginosis) 2011   Chlamydia infection    CIN I (cervical intraepithelial neoplasia I) 2008   Depression    Dyspnea    Dysrhythmia 2009   TACHYCARDIA;   HAD W/U 2010? 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  B/P nl, and 122/76 on repeat-sitting   Postpartum depression 2011   NO MEDS   Pregnancy induced hypertension    ALL PREGNANCIES   Shortness of breath    WITH EXERTION SICNE PREGNANCY   Tachycardia    Tachycardia 2010   HAS HAD WORKUP. UNSURE IF HAS POSSIBLE HEART VALVE ISSUE   Vulvitis 2009    Past Surgical History:  Procedure Laterality Date   BREAST REDUCTION SURGERY  2008   BREAST SURGERY     DILATION AND CURETTAGE OF UTERUS     FINGER SURGERY     Right index finger   TUBAL LIGATION  01/09/2013   Procedure: POST PARTUM TUBAL LIGATION;  Surgeon: Michael Litter, MD;  Location: [facility];  Service: Gynecology;  Laterality: Bilateral;  post partum tubal ligation bilateral   US ECHOCARDIOGRAPHY  06/18/2009   ef 55-60%   WISDOM TOOTH EXTRACTION      Family History  Problem Relation Age of Onset   Hypertension Mother    Hyperlipidemia Mother    Coronary artery disease Mother    Heart disease Mother        high cholesterol   Asthma Mother    Diabetes Mother    Depression Mother    Anxiety disorder Mother    Sleep apnea Mother    Obesity Mother     Eating disorder Mother    Hypertension Father    Hypertension Maternal Grandmother    Hypertension Maternal Grandfather    Asthma Daughter    Alcohol abuse Maternal Uncle    Drug abuse Maternal Uncle     Social History   Socioeconomic History   Marital status: Married    Spouse name: Jerl Santos   Number of children: 3   Years of education: 13   Highest education level: Not on file  Occupational History   Occupation: CUSTOMER SERVICE     Employer: [employer]  Tobacco Use   Smoking status: Never   Smokeless tobacco: Never  Vaping Use   Vaping Use: Never used  Substance and Sexual Activity   Alcohol use: Yes    Comment: socially   Drug use: No   Sexual activity: Not Currently    Partners: Male    Birth control/protection: None  Other Topics Concern   Not on file  Social History Narrative   Not on file   Social Determinants of Health   Financial Resource Strain: Not on file  Food Insecurity: Not on file  Transportation Needs: Not on file  Physical Activity: Not on file  Stress: Not on file  Social Connections: Not on file  Intimate Partner Violence: Not on file    Outpatient Medications Prior to Visit  Medication Sig Dispense Refill   benzonatate (TESSALON) 100 MG capsule Take 1 capsule (100 mg total) by mouth every 8 (eight) hours. 21 capsule 0   cetirizine (ZYRTEC ALLERGY) 10 MG tablet Take 1 tablet (10 mg total) by mouth daily. 30 tablet 0   ferrous sulfate 325 (65 FE) MG tablet Take 1 tablet (325 mg total) by mouth 3 (three) times daily with meals. 90 tablet 3   fluticasone (FLONASE) 50 MCG/ACT nasal spray Place 1 spray into both nostrils daily. 16 g 0   ibuprofen (ADVIL) 800 MG tablet Take 1 tablet (800 mg total) by mouth 3 (three) times daily. 30 tablet 0   Vitamin D, Ergocalciferol, (DRISDOL) 1.25 MG (50000 UNIT) CAPS capsule Take 1 capsule (50,000 Units total) by mouth every 7 (seven) days. 4 capsule 0   No facility-administered medications prior to  visit.    Allergies  Allergen Reactions   Latex Itching and Rash    Review of Systems  Constitutional:  Negative for chills and fever.  HENT:  Positive for rhinorrhea and sore throat. Negative for ear pain.   Respiratory:  Positive for cough and shortness of breath. Negative for hemoptysis, sputum production and wheezing.   Cardiovascular:  Negative  for chest pain, orthopnea, claudication, leg swelling, syncope and PND.  Gastrointestinal:  Positive for diarrhea. Negative for abdominal pain, nausea and vomiting.  Musculoskeletal:  Negative for neck pain.  Skin:  Negative for rash.  Neurological:  Negative for dizziness and headaches.  Psychiatric/Behavioral:  Negative for confusion.       Objective:    Physical Exam Constitutional:      General: She is not in acute distress.    Appearance: Normal appearance. She is not ill-appearing, toxic-appearing or diaphoretic.  HENT:     Head: Normocephalic and atraumatic.  Eyes:     General: No scleral icterus. Pulmonary:     Effort: Pulmonary effort is normal.  Skin:    Coloration: Skin is not pale.  Neurological:     Mental Status: She is alert and oriented to person, place, and time.  Psychiatric:        Mood and Affect: Mood normal.        Behavior: Behavior normal.        Thought Content: Thought content normal.        Judgment: Judgment normal.    There were no vitals taken for this visit. Wt Readings from Last 3 Encounters:  04/10/20 190 lb (86.2 kg)  03/06/20 192 lb (87.1 kg)  12/08/19 199 lb 3.2 oz (90.4 kg)    Health Maintenance Due  Topic Date Due   COVID-19 Vaccine (1) Never done   Pneumococcal Vaccine 410-438 Years old (1 - PCV) Never done   Hepatitis C Screening  Never done    There are no preventive care reminders to display for this patient.   Lab Results  Component Value Date   TSH 0.755 09/08/2018   Lab Results  Component Value Date   WBC 5.1 09/04/2019   HGB 11.1 (L) 09/04/2019   HCT 37.6  09/04/2019   MCV 80.7 09/04/2019   PLT 305 09/04/2019   Lab Results  Component Value Date   NA 139 09/04/2019   K 3.6 09/04/2019   CO2 25 09/04/2019   GLUCOSE 115 (H) 09/04/2019   BUN 19 09/04/2019   CREATININE 0.84 09/04/2019   BILITOT 0.8 09/08/2018   ALKPHOS 78 09/08/2018   AST 25 09/08/2018   ALT 30 09/08/2018   PROT 7.7 09/08/2018   ALBUMIN 4.5 09/08/2018   CALCIUM 9.6 09/04/2019   ANIONGAP 9 09/04/2019   Lab Results  Component Value Date   CHOL 213 (H) 12/17/2018   Lab Results  Component Value Date   HDL 71 12/17/2018   Lab Results  Component Value Date   LDLCALC 125 (H) 12/17/2018   Lab Results  Component Value Date   TRIG 87 12/17/2018   Lab Results  Component Value Date   CHOLHDL 3.0 12/17/2018   Lab Results  Component Value Date   HGBA1C 5.2 09/08/2018       Assessment & Plan:   Problem List Items Addressed This Visit   None Visit Diagnoses     COVID    -  Primary   Relevant Medications   molnupiravir EUA 200 mg CAPS   Dyspnea, unspecified type       Nasal congestion            Meds ordered this encounter  Medications   albuterol (VENTOLIN HFA) 108 (90 Base) MCG/ACT inhaler    Sig: Inhale 2 puffs into the lungs every 6 (six) hours as needed for wheezing or shortness of breath.    Dispense:  8 g  Refill:  0    Order Specific Question:   Supervising Provider    Answer:   Hyacinth Meeker, BRIAN [[id number]]   benzonatate (TESSALON) 100 MG capsule    Sig: Take 1 capsule (100 mg total) by mouth 2 (two) times daily as needed for cough.    Dispense:  20 capsule    Refill:  0    Order Specific Question:   Supervising Provider    Answer:   MILLER, BRIAN [[id number]]   fluticasone (FLONASE) 50 MCG/ACT nasal spray    Sig: Place 2 sprays into both nostrils daily.    Dispense:  16 g    Refill:  6    Order Specific Question:   Supervising Provider    Answer:   Hyacinth Meeker, BRIAN [[id number]]   molnupiravir EUA 200 mg CAPS    Sig: Take 4 capsules (800 mg total) by  mouth 2 (two) times daily for 5 days.    Dispense:  40 capsule    Refill:  0    Order Specific Question:   Supervising Provider    Answer:   Hyacinth Meeker, BRIAN [[id number]]     Advised if dyspnea persists or worsens, or if she develop any new symptoms such as leg pain/swelling to go to theER Demetrio Lapping, PA-C

## 2021-06-27 ENCOUNTER — Encounter: Payer: Self-pay | Admitting: Physician Assistant

## 2021-06-27 ENCOUNTER — Telehealth: Payer: No Typology Code available for payment source | Admitting: Physician Assistant

## 2021-06-27 DIAGNOSIS — U071 COVID-19: Secondary | ICD-10-CM | POA: Diagnosis not present

## 2021-06-27 DIAGNOSIS — R059 Cough, unspecified: Secondary | ICD-10-CM

## 2021-06-27 MED ORDER — PSEUDOEPH-BROMPHEN-DM 30-2-10 MG/5ML PO SYRP: 5.0000 mL | ORAL_SOLUTION | Freq: Four times a day (QID) | ORAL | 0 refills | Status: DC | PRN

## 2021-06-27 NOTE — Patient Instructions (Signed)
  Patricia Lin, thank you for joining Margaretann Loveless, PA-C for today's virtual visit.  While this provider is not your primary care provider (PCP), if your PCP is located in our provider database this encounter information will be shared with them immediately following your visit.  Consent: (Patient) Patricia Lin provided verbal consent for this virtual visit at the beginning of the encounter.  Current Medications:  Current Outpatient Medications:    brompheniramine-pseudoephedrine-DM 30-2-10 MG/5ML syrup, Take 5 mLs by mouth 4 (four) times daily as needed., Disp: 120 mL, Rfl: 0   albuterol (VENTOLIN HFA) 108 (90 Base) MCG/ACT inhaler, Inhale 2 puffs into the lungs every 6 (six) hours as needed for wheezing or shortness of breath., Disp: 8.5 g, Rfl: 0   benzonatate (TESSALON) 100 MG capsule, Take 1 capsule (100 mg total) by mouth every 8 (eight) hours., Disp: 21 capsule, Rfl: 0   benzonatate (TESSALON) 100 MG capsule, Take 1 capsule (100 mg total) by mouth 2 (two) times daily as needed for cough., Disp: 20 capsule, Rfl: 0   cetirizine (ZYRTEC ALLERGY) 10 MG tablet, Take 1 tablet (10 mg total) by mouth daily., Disp: 30 tablet, Rfl: 0   ferrous sulfate 325 (65 FE) MG tablet, Take 1 tablet (325 mg total) by mouth 3 (three) times daily with meals., Disp: 90 tablet, Rfl: 3   fluticasone (FLONASE) 50 MCG/ACT nasal spray, Place 1 spray into both nostrils daily., Disp: 16 g, Rfl: 0   fluticasone (FLONASE) 50 MCG/ACT nasal spray, Place 2 sprays into both nostrils daily., Disp: 16 g, Rfl: 6   ibuprofen (ADVIL) 800 MG tablet, Take 1 tablet (800 mg total) by mouth 3 (three) times daily., Disp: 30 tablet, Rfl: 0   molnupiravir EUA 200 mg CAPS, Take 4 capsules (800 mg total) by mouth 2 (two) times daily for 5 days., Disp: 40 capsule, Rfl: 0   Vitamin D, Ergocalciferol, (DRISDOL) 1.25 MG (50000 UNIT) CAPS capsule, Take 1 capsule (50,000 Units total) by mouth every 7 (seven) days., Disp: 4 capsule, Rfl: 0    Medications ordered in this encounter:  Meds ordered this encounter  Medications   brompheniramine-pseudoephedrine-DM 30-2-10 MG/5ML syrup    Sig: Take 5 mLs by mouth 4 (four) times daily as needed.    Dispense:  120 mL    Refill:  0    Order Specific Question:   Supervising Provider    Answer:   Hyacinth Meeker, BRIAN [[id number]]     *If you need refills on other medications prior to your next appointment, please contact your pharmacy*  Follow-Up: Call back or seek an in-person evaluation if the symptoms worsen or if the condition fails to improve as anticipated.  If you have been instructed to have an in-person evaluation today at a local Urgent Care facility, please use the link below. It will take you to a list of all of our available [facility], including address, phone number and hours of operation. Please do not delay care.  [facility]  If you or a family member do not have a primary care provider, use the link below to schedule a visit and establish care. When you choose a [facility] primary care physician or advanced practice provider, you gain a [facility]-term partner in health. Find a Primary Care Provider  Learn more about [facility] in-office and virtual care options:  - Get Care Now

## 2021-06-27 NOTE — Progress Notes (Signed)
Virtual Visit Consent   Patricia Lin, you are scheduled for a virtual visit with a [facility] provider today.     Just as with appointments in the office, your consent must be obtained to participate.  Your consent will be active for this visit and any virtual visit you may have with one of our providers in the next 365 days.     If you have a MyChart account, a copy of this consent can be sent to you electronically.  All virtual visits are billed to your insurance company just like a traditional visit in the office.    As this is a virtual visit, video technology does not allow for your provider to perform a traditional examination.  This may limit your provider's ability to fully assess your condition.  If your provider identifies any concerns that need to be evaluated in person or the need to arrange testing (such as labs, EKG, etc.), we will make arrangements to do so.     Although advances in technology are sophisticated, we cannot ensure that it will always work on either your end or our end.  If the connection with a video visit is poor, the visit may have to be switched to a telephone visit.  With either a video or telephone visit, we are not always able to ensure that we have a secure connection.     I need to obtain your verbal consent now.   Are you willing to proceed with your visit today?    [name] has provided verbal consent on 06/27/2021 for a virtual visit (video or telephone).   Margaretann Loveless, PA-C   Date: 06/27/2021 8:56 AM   Virtual Visit via Video Note   I, Margaretann Loveless, connected with  Patricia Lin  ([id number], 11/24/1981) on 06/27/21 at  9:00 AM EDT by a video-enabled telemedicine application and verified that I am speaking with the correct person using two identifiers.  Location: Patient: Virtual Visit Location Patient: Home Provider: Virtual Visit Location Provider: Home Office   I discussed the limitations of evaluation and management by  telemedicine and the availability of in person appointments. The patient expressed understanding and agreed to proceed.    History of Present Illness: Patricia Lin is a 40 y.o. who identifies as a female who was assigned female at birth, and is being seen today for continued cough with Covid 19. She completed a video visit on 06/22/21 for Covid 19. She had just tested positive, but symptoms had been present x 4 days. She was started on Molnupiravir, tessalon perles, and albuterol inhaler. She reports overall she is feeling some better, but has a continued dry cough that is not helped by the tessalon perles. She reports still having a lot of mucous production. Denies SOB, difficulty breathing, wheezing, chest tightness.    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   Cervical shortening 09/18/2012   Symptomatic anemia 09/03/2012   H/O pre-eclampsia in prior pregnancy, currently pregnant 08/19/2012   Chronic hypertension in pregnancy 08/19/2012   Hx LEEP (loop electrosurgical excision procedure), cervix, pregnancy 08/19/2012   Hx of postpartum depression, currently pregnant 08/19/2012   Late prenatal care  08/19/2012   Hx of precipitous labor and deliveries, antepartum 08/19/2012   Muscle tension HAs 08/19/2012    Allergies:  Allergies  Allergen Reactions   Latex Itching and Rash   Medications:  Current Outpatient Medications:    brompheniramine-pseudoephedrine-DM 30-2-10 MG/5ML syrup, Take 5 mLs by mouth 4 (four) times daily as needed., Disp: 120 mL, Rfl: 0   albuterol (VENTOLIN HFA) 108 (90 Base) MCG/ACT inhaler, Inhale 2 puffs into the lungs every 6 (six) hours as needed for  wheezing or shortness of breath., Disp: 8.5 g, Rfl: 0   benzonatate (TESSALON) 100 MG capsule, Take 1 capsule (100 mg total) by mouth every 8 (eight) hours., Disp: 21 capsule, Rfl: 0   benzonatate (TESSALON) 100 MG capsule, Take 1 capsule (100 mg total) by mouth 2 (two) times daily as needed for cough., Disp: 20 capsule, Rfl: 0   cetirizine (ZYRTEC ALLERGY) 10 MG tablet, Take 1 tablet (10 mg total) by mouth daily., Disp: 30 tablet, Rfl: 0   ferrous sulfate 325 (65 FE) MG tablet, Take 1 tablet (325 mg total) by mouth 3 (three) times daily with meals., Disp: 90 tablet, Rfl: 3   fluticasone (FLONASE) 50 MCG/ACT nasal spray, Place 1 spray into both nostrils daily., Disp: 16 g, Rfl: 0   fluticasone (FLONASE) 50 MCG/ACT nasal spray, Place 2 sprays into both nostrils daily., Disp: 16 g, Rfl: 6   ibuprofen (ADVIL) 800 MG tablet, Take 1 tablet (800 mg total) by mouth 3 (three) times daily., Disp: 30 tablet, Rfl: 0   molnupiravir EUA 200 mg CAPS, Take 4 capsules (800 mg total) by mouth 2 (two) times daily for 5 days., Disp: 40 capsule, Rfl: 0   Vitamin D, Ergocalciferol, (DRISDOL) 1.25 MG (50000 UNIT) CAPS capsule, Take 1 capsule (50,000 Units total) by mouth every 7 (seven) days., Disp: 4 capsule, Rfl: 0  Observations/Objective: Patient is well-developed, well-nourished in no acute distress.  Resting comfortably at home.  Head is normocephalic, atraumatic.  No labored breathing. Dry cough heard infrequently, not effecting speech Speech is clear and coherent with logical content.  Patient is alert and oriented at baseline.   Assessment and Plan: 1. COVID-19 - brompheniramine-pseudoephedrine-DM 30-2-10 MG/5ML syrup; Take 5 mLs by mouth 4 (four) times daily as needed.  Dispense: 120 mL; Refill: 0  2. Cough - brompheniramine-pseudoephedrine-DM 30-2-10 MG/5ML syrup; Take 5 mLs by mouth 4 (four) times daily as needed.  Dispense: 120 mL; Refill: 0  - Cough from covid 19 not well controlled by tessalon  perles - No worrisome signs/symptoms for pneumonia - Will add Bromfed DM for congestion and cough - Seek in person evaluation if not improving or worsening  Follow Up Instructions: I discussed the assessment and treatment plan with the patient. The patient was provided an opportunity to ask questions and all were answered. The patient agreed with the plan and demonstrated an understanding of the instructions.  A copy of instructions were sent to the patient via MyChart.  The patient was advised to call back or seek an in-person evaluation if the symptoms worsen or if the condition fails to improve as anticipated.  Time:  I spent 12 minutes with the patient via telehealth technology discussing the above problems/concerns.    Margaretann Loveless, PA-C

## 2021-12-08 DIAGNOSIS — Z8679 Personal history of other diseases of the circulatory system: Secondary | ICD-10-CM

## 2021-12-08 HISTORY — DX: Personal history of other diseases of the circulatory system: Z86.79

## 2022-01-01 ENCOUNTER — Ambulatory Visit
Admission: EM | Admit: 2022-01-01 | Discharge: 2022-01-01 | Disposition: A | Discharge status: Home / Self Care | Payer: No Typology Code available for payment source

## 2022-01-01 ENCOUNTER — Other Ambulatory Visit: Payer: Self-pay

## 2022-01-01 DIAGNOSIS — B3731 Acute candidiasis of vulva and vagina: Secondary | ICD-10-CM

## 2022-01-01 DIAGNOSIS — J069 Acute upper respiratory infection, unspecified: Secondary | ICD-10-CM | POA: Diagnosis not present

## 2022-01-01 MED ORDER — FLUCONAZOLE 150 MG PO TABS: 150.0000 mg | ORAL_TABLET | Freq: Once | ORAL | 0 refills | Status: AC

## 2022-01-01 MED ORDER — PSEUDOEPH-BROMPHEN-DM 30-2-10 MG/5ML PO SYRP: 5.0000 mL | ORAL_SOLUTION | Freq: Four times a day (QID) | ORAL | 0 refills | Status: AC | PRN

## 2022-01-01 NOTE — ED Provider Notes (Signed)
[facility]    CSN: [id number] Arrival date & time: 01/01/22  0846      History   Chief Complaint Chief Complaint  Patient presents with   Cough    HPI Patricia Lin is a 41 y.o. female.   Patient here today for evaluation of cough that started yesterday.  She states that she does have a central burning pain in her chest when she coughs.  She has taken Robitussin without significant relief.  She did have sore throat but this is resolved.  She also reports symptoms consistent with yeast infection.  She was recently treated with Augmentin for dental issues, and states she has completed antibiotics but continues to have thick discharge with vaginal itching.  She denies any other concerns for STDs.  The history is provided by the patient.  Cough Associated symptoms: sore throat   Associated symptoms: no chills, no ear pain, no eye discharge, no fever, no shortness of breath and no wheezing    Past Medical History:  Diagnosis Date   Abnormal Pap smear    Anemia    Anxiety    Back pain    BV (bacterial vaginosis) 2011   Chlamydia infection    CIN I (cervical intraepithelial neoplasia I) 2008   Depression    Dyspnea    Dysrhythmia 2009   TACHYCARDIA;   HAD W/U 2010? 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  B/P nl, and 122/76 on repeat-sitting   Postpartum depression 2011   NO MEDS   Pregnancy  induced hypertension    ALL PREGNANCIES   Shortness of breath    WITH EXERTION SICNE PREGNANCY   Tachycardia    Tachycardia 2010   HAS HAD WORKUP. 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/18/2012   Cervical shortening 09/18/2012   Symptomatic anemia 09/03/2012   H/O pre-eclampsia in prior pregnancy, currently pregnant 08/19/2012   Chronic hypertension in pregnancy 08/19/2012   Hx LEEP (loop electrosurgical excision procedure), cervix, pregnancy 08/19/2012   Hx of postpartum depression, currently pregnant 08/19/2012   Late prenatal care 08/19/2012   Hx of precipitous labor and deliveries, antepartum 08/19/2012   Muscle tension HAs 08/19/2012    Past Surgical History:  Procedure Laterality Date   BREAST REDUCTION SURGERY  2008   BREAST SURGERY     DILATION AND CURETTAGE OF UTERUS  FINGER SURGERY     Right index finger   TUBAL LIGATION  01/09/2013   Procedure: POST PARTUM TUBAL LIGATION;  Surgeon: Michael LitterNaima A Dillard, MD;  Location: [facility];  Service: Gynecology;  Laterality: Bilateral;  post partum tubal ligation bilateral   US ECHOCARDIOGRAPHY  06/18/2009   ef 55-60%   WISDOM TOOTH EXTRACTION      OB History     Gravida  7   Para  4   Term  4   Preterm      AB  3   Living  4      SAB  1   IAB  2   Ectopic      Multiple      Live Births  4        Obstetric Comments  2011 PRECLAMPSIA PP; IN ICU X 3 DAYS          Home Medications    Prior to Admission medications   Medication Sig Start Date End Date Taking? Authorizing Provider  brompheniramine-pseudoephedrine-DM 30-2-10 MG/5ML syrup Take 5 mLs  by mouth 4 (four) times daily as needed for up to 6 days. 01/01/22 01/07/22 Yes Tomi BambergerMyers, Nikkie Liming F, PA-C  fluconazole (DIFLUCAN) 150 MG tablet Take 1 tablet (150 mg total) by mouth once for 1 dose. 01/01/22 01/01/22 Yes Tomi BambergerMyers, Vy Badley F, PA-C  albuterol (VENTOLIN HFA) 108 (90 Base) MCG/ACT inhaler Inhale 2 puffs into the lungs every 6 (six) hours as needed for wheezing or shortness of breath. 06/22/21   Demetrio Lappingsman, Sahar M, PA-C  amoxicillin-clavulanate (AUGMENTIN) 500-125 MG tablet Take 1 tablet by mouth 3 (three) times daily. 12/04/21   [provider]  benzonatate (TESSALON) 100 MG capsule Take 1 capsule (100 mg total) by mouth every 8 (eight) hours. 11/20/20   Hall-Potvin, [place]itt[place], PA-C  benzonatate (TESSALON) 100 MG capsule Take 1 capsule (100 mg total) by mouth 2 (two) times daily as needed for cough. 06/22/21   Demetrio Lappingsman, Sahar M, PA-C  cetirizine (ZYRTEC ALLERGY) 10 MG tablet Take 1 tablet (10 mg total) by mouth daily. 11/20/20   Hall-Potvin, [place], PA-C  ferrous sulfate 325 (65 FE) MG tablet Take 1 tablet (325 mg total) by mouth 3 (three) times daily with meals. 12/14/19   Quillian QuinceBeasley, Caren D, MD  fluticasone (FLONASE) 50 MCG/ACT nasal spray Place 1 spray into both nostrils daily. 11/20/20   Hall-Potvin, [place], PA-C  fluticasone (FLONASE) 50 MCG/ACT nasal spray Place 2 sprays into both nostrils daily. 06/22/21   Demetrio Lappingsman, Sahar M, PA-C  ibuprofen (ADVIL) 800 MG tablet Take 1 tablet (800 mg total) by mouth 3 (three) times daily. 04/10/20   Avegno, Zachery DakinsKomlanvi S, FNP  Vitamin D, Ergocalciferol, (DRISDOL) 1.25 MG (50000 UNIT) CAPS capsule Take 1 capsule (50,000 Units total) by mouth every 7 (seven) days. 02/21/20   Alois ClicheAguilar, Tracey, PA-C    Family History Family History  Problem Relation Age of Onset   Hypertension Mother    Hyperlipidemia Mother    Coronary artery disease Mother    Heart disease Mother        high cholesterol   Asthma Mother    Diabetes Mother    Depression Mother    Anxiety disorder  Mother    Sleep apnea Mother    Obesity Mother    Eating disorder Mother    Hypertension Father    Hypertension Maternal Grandmother    Hypertension Maternal Grandfather    Asthma Daughter    Alcohol abuse Maternal Uncle    Drug  abuse Maternal Uncle     Social History Social History   Tobacco Use   Smoking status: Never   Smokeless tobacco: Never  Vaping Use   Vaping Use: Never used  Substance Use Topics   Alcohol use: Yes    Comment: socially   Drug use: No     Allergies   Latex   Review of Systems Review of Systems  Constitutional:  Negative for chills and fever.  HENT:  Positive for congestion and sore throat. Negative for ear pain.   Eyes:  Negative for discharge and redness.  Respiratory:  Positive for cough. Negative for shortness of breath and wheezing.   Gastrointestinal:  Negative for abdominal pain, diarrhea, nausea and vomiting.  Genitourinary:  Positive for vaginal discharge.    Physical Exam Triage Vital Signs ED Triage Vitals  Enc Vitals Group     BP 01/01/22 1058 (!) 142/87     Pulse Rate 01/01/22 1058 72     Resp 01/01/22 1058 18     Temp 01/01/22 1058 98.7 F (37.1 C)     Temp Source 01/01/22 1058 Oral     SpO2 01/01/22 1058 100 %     Weight --      Height --      Head Circumference --      Peak Flow --      Pain Score 01/01/22 1100 8     Pain Loc --      Pain Edu? --      Excl. in GC? --    No data found.  Updated Vital Signs BP (!) 142/87 (BP Location: Left Arm)    Pulse 72    Temp 98.7 F (37.1 C) (Oral)    Resp 18    SpO2 100%      Physical Exam Vitals and nursing note reviewed.  Constitutional:      General: She is not in acute distress.    Appearance: Normal appearance. She is not ill-appearing.  HENT:     Head: Normocephalic and atraumatic.     Nose: Congestion present.     Mouth/Throat:     Mouth: Mucous membranes are moist.     Pharynx: No oropharyngeal exudate or posterior oropharyngeal erythema.  Eyes:      Conjunctiva/sclera: Conjunctivae normal.  Cardiovascular:     Rate and Rhythm: Normal rate and regular rhythm.     Heart sounds: Normal heart sounds. No murmur heard. Pulmonary:     Effort: Pulmonary effort is normal. No respiratory distress.     Breath sounds: Normal breath sounds. No wheezing, rhonchi or rales.  Skin:    General: Skin is warm and dry.  Neurological:     Mental Status: She is alert.  Psychiatric:        Mood and Affect: Mood normal.        Thought Content: Thought content normal.     UC Treatments / Results  Labs (all labs ordered are listed, but only abnormal results are displayed) Labs Reviewed  COVID-19, FLU A+B NAA    EKG   Radiology No results found.  Procedures Procedures (including critical care time)  Medications Ordered in UC Medications - No data to display  Initial Impression / Assessment and Plan / UC Course  I have reviewed the triage vital signs and the nursing notes.  Pertinent labs & imaging results that were available during my care of the patient were reviewed by me and considered in my medical decision making (see  chart for details).    Suspect viral etiology of upper respiratory symptoms.  Will screen for COVID and flu.  Recommend symptomatic treatment and patient request cough syrup which was also sent to pharmacy.  Encouraged follow-up with any further concerns.  Diflucan prescribed for suspected yeast vaginitis given recent antibiotic therapy.  Recommended follow-up if no improvement with treatment.  Final Clinical Impressions(s) / UC Diagnoses   Final diagnoses:  Acute upper respiratory infection  Yeast vaginitis   Discharge Instructions   None    ED Prescriptions     Medication Sig Dispense Auth. Provider   fluconazole (DIFLUCAN) 150 MG tablet Take 1 tablet (150 mg total) by mouth once for 1 dose. 1 tablet Tomi BambergerMyers, Vrishank Moster F, PA-C   brompheniramine-pseudoephedrine-DM 30-2-10 MG/5ML syrup Take 5 mLs by mouth 4 (four)  times daily as needed for up to 6 days. 120 mL Tomi BambergerMyers, Velvia Mehrer F, PA-C      PDMP not reviewed this encounter.   Tomi BambergerMyers, Satya Buttram F, PA-C 01/01/22 1133

## 2022-01-01 NOTE — ED Triage Notes (Signed)
Onset yesterday of cough. Pt reports that her chest "burns" when she coughs. Has been taking robitussin and using inhaler without relief. At the onset Pt reports a sore throat that has resolved. She also complains of back pain but says that she isn't sure if that stems from being tired.  Negative at home covid test. Pt is currently on an augmentin for a tooth ache and notes sxs of a yeast infection.

## 2022-01-03 ENCOUNTER — Telehealth: Payer: No Typology Code available for payment source | Admitting: Physician Assistant

## 2022-01-03 DIAGNOSIS — U071 COVID-19: Secondary | ICD-10-CM | POA: Diagnosis not present

## 2022-01-03 LAB — COVID-19, FLU A+B NAA
Influenza A, NAA: NOT DETECTED
Influenza B, NAA: NOT DETECTED
SARS-CoV-2, NAA: DETECTED — AB

## 2022-01-03 MED ORDER — BENZONATATE 100 MG PO CAPS: 100.0000 mg | ORAL_CAPSULE | Freq: Three times a day (TID) | ORAL | 0 refills | Status: DC | PRN

## 2022-01-03 MED ORDER — MOLNUPIRAVIR EUA 200MG CAPSULE: 4.0000 | ORAL_CAPSULE | Freq: Two times a day (BID) | ORAL | 0 refills | Status: AC

## 2022-01-03 MED ORDER — PREDNISONE 20 MG PO TABS: 20.0000 mg | ORAL_TABLET | Freq: Every day | ORAL | 0 refills | Status: DC

## 2022-01-03 NOTE — Patient Instructions (Signed)
Patricia Lin, thank you for joining Mar Daring, PA-C for today's virtual visit.  While this provider is not your primary care provider (PCP), if your PCP is located in our provider database this encounter information will be shared with them immediately following your visit.  Consent: (Patient) Patricia Lin provided verbal consent for this virtual visit at the beginning of the encounter.  Current Medications:  Current Outpatient Medications:    benzonatate (TESSALON) 100 MG capsule, Take 1 capsule (100 mg total) by mouth 3 (three) times daily as needed., Disp: 30 capsule, Rfl: 0   molnupiravir EUA (LAGEVRIO) 200 mg CAPS capsule, Take 4 capsules (800 mg total) by mouth 2 (two) times daily for 5 days., Disp: 40 capsule, Rfl: 0   predniSONE (DELTASONE) 20 MG tablet, Take 1 tablet (20 mg total) by mouth daily with breakfast., Disp: 5 tablet, Rfl: 0   albuterol (VENTOLIN HFA) 108 (90 Base) MCG/ACT inhaler, Inhale 2 puffs into the lungs every 6 (six) hours as needed for wheezing or shortness of breath., Disp: 8.5 g, Rfl: 0   amoxicillin-clavulanate (AUGMENTIN) 500-125 MG tablet, Take 1 tablet by mouth 3 (three) times daily., Disp: , Rfl:    brompheniramine-pseudoephedrine-DM 30-2-10 MG/5ML syrup, Take 5 mLs by mouth 4 (four) times daily as needed for up to 6 days., Disp: 120 mL, Rfl: 0   cetirizine (ZYRTEC ALLERGY) 10 MG tablet, Take 1 tablet (10 mg total) by mouth daily., Disp: 30 tablet, Rfl: 0   ferrous sulfate 325 (65 FE) MG tablet, Take 1 tablet (325 mg total) by mouth 3 (three) times daily with meals., Disp: 90 tablet, Rfl: 3   fluticasone (FLONASE) 50 MCG/ACT nasal spray, Place 1 spray into both nostrils daily., Disp: 16 g, Rfl: 0   fluticasone (FLONASE) 50 MCG/ACT nasal spray, Place 2 sprays into both nostrils daily., Disp: 16 g, Rfl: 6   ibuprofen (ADVIL) 800 MG tablet, Take 1 tablet (800 mg total) by mouth 3 (three) times daily., Disp: 30 tablet, Rfl: 0   Vitamin D, Ergocalciferol,  (DRISDOL) 1.25 MG (50000 UNIT) CAPS capsule, Take 1 capsule (50,000 Units total) by mouth every 7 (seven) days., Disp: 4 capsule, Rfl: 0   Medications ordered in this encounter:  Meds ordered this encounter  Medications   molnupiravir EUA (LAGEVRIO) 200 mg CAPS capsule    Sig: Take 4 capsules (800 mg total) by mouth 2 (two) times daily for 5 days.    Dispense:  40 capsule    Refill:  0    Order Specific Question:   Supervising Provider    Answer:   MILLER, BRIAN [[id number]]   benzonatate (TESSALON) 100 MG capsule    Sig: Take 1 capsule (100 mg total) by mouth 3 (three) times daily as needed.    Dispense:  30 capsule    Refill:  0    Order Specific Question:   Supervising Provider    Answer:   MILLER, BRIAN [[id number]]   predniSONE (DELTASONE) 20 MG tablet    Sig: Take 1 tablet (20 mg total) by mouth daily with breakfast.    Dispense:  5 tablet    Refill:  0    Order Specific Question:   Supervising Provider    Answer:   Sabra Heck, BRIAN [[id number]]     *If you need refills on other medications prior to your next appointment, please contact your pharmacy*  Follow-Up: Call back or seek an in-person evaluation if the symptoms worsen or if the condition fails to improve as  anticipated.  Other Instructions COVID-19: Quarantine and Isolation Quarantine If you were exposed Quarantine and stay away from others when you have been in close contact with someone who has COVID-19. Isolate If you are sick or test positive Isolate when you are sick or when you have COVID-19, even if you don't have symptoms. When to stay home Calculating quarantine The date of your exposure is considered day 0. Day 1 is the first full day after your last contact with a person who has had COVID-19. Stay home and away from other people for at least 5 days. Learn why CDC updated guidance for the general public. IF YOU were exposed to COVID-19 and are NOT  up to dateIF YOU were exposed to COVID-19 and are NOT on COVID-19  vaccinations Quarantine for at least 5 days Stay home Stay home and quarantine for at least 5 full days. Wear a well-fitting mask if you must be around others in your home. Do not travel. Get tested Even if you don't develop symptoms, get tested at least 5 days after you last had close contact with someone with COVID-19. After quarantine Watch for symptoms Watch for symptoms until 10 days after you last had close contact with someone with COVID-19. Avoid travel It is best to avoid travel until a full 10 days after you last had close contact with someone with COVID-19. If you develop symptoms Isolate immediately and get tested. Continue to stay home until you know the results. Wear a well-fitting mask around others. Take precautions until day 10 Wear a well-fitting mask Wear a well-fitting mask for 10 full days any time you are around others inside your home or in public. Do not go to places where you are unable to wear a well-fitting mask. If you must travel during days 6-10, take precautions. Avoid being around people who are more likely to get very sick from COVID-19. IF YOU were exposed to COVID-19 and are  up to dateIF YOU were exposed to COVID-19 and are on COVID-19 vaccinations No quarantine You do not need to stay home unless you develop symptoms. Get tested Even if you don't develop symptoms, get tested at least 5 days after you last had close contact with someone with COVID-19. Watch for symptoms Watch for symptoms until 10 days after you last had close contact with someone with COVID-19. If you develop symptoms Isolate immediately and get tested. Continue to stay home until you know the results. Wear a well-fitting mask around others. Take precautions until day 10 Wear a well-fitting mask Wear a well-fitting mask for 10 full days any time you are around others inside your home or in public. Do not go to places where you are unable to wear a well-fitting mask. Take  precautions if traveling Avoid being around people who are more likely to get very sick from COVID-19. IF YOU were exposed to COVID-19 and had confirmed COVID-19 within the past 90 days (you tested positive using a viral test) No quarantine You do not need to stay home unless you develop symptoms. Watch for symptoms Watch for symptoms until 10 days after you last had close contact with someone with COVID-19. If you develop symptoms Isolate immediately and get tested. Continue to stay home until you know the results. Wear a well-fitting mask around others. Take precautions until day 10 Wear a well-fitting mask Wear a well-fitting mask for 10 full days any time you are around others inside your home or in public. Do not go  to places where you are unable to wear a well-fitting mask. Take precautions if traveling Avoid being around people who are more likely to get very sick from COVID-19. Calculating isolation Day 0 is your first day of symptoms or a positive viral test. Day 1 is the first full day after your symptoms developed or your test specimen was collected. If you have COVID-19 or have symptoms, isolate for at least 5 days. IF YOU tested positive for COVID-19 or have symptoms, regardless of vaccination status Stay home for at least 5 days Stay home for 5 days and isolate from others in your home. Wear a well-fitting mask if you must be around others in your home. Do not travel. Ending isolation if you had symptoms End isolation after 5 full days if you are fever-free for 24 hours (without the use of fever-reducing medication) and your symptoms are improving. Ending isolation if you did NOT have symptoms End isolation after at least 5 full days after your positive test. If you got very sick from COVID-19 or have a weakened immune system You should isolate for at least 10 days. Consult your doctor before ending isolation. Take precautions until day 10 Wear a well-fitting mask Wear a  well-fitting mask for 10 full days any time you are around others inside your home or in public. Do not go to places where you are unable to wear a well-fitting mask. Do not travel Do not travel until a full 10 days after your symptoms started or the date your positive test was taken if you had no symptoms. Avoid being around people who are more likely to get very sick from COVID-19. Definitions Exposure Contact with someone infected with SARS-CoV-2, the virus that causes COVID-19, in a way that increases the likelihood of getting infected with the virus. Close contact A close contact is someone who was less than 6 feet away from an infected person (laboratory-confirmed or a clinical diagnosis) for a cumulative total of 15 minutes or more over a 24-hour period. For example, three individual 5-minute exposures for a total of 15 minutes. People who are exposed to someone with COVID-19 after they completed at least 5 days of isolation are not considered close contacts. Julio Sicks is a strategy used to prevent transmission of COVID-19 by keeping people who have been in close contact with someone with COVID-19 apart from others. Who does not need to quarantine? If you had close contact with someone with COVID-19 and you are in one of the following groups, you do not need to quarantine. You are up to date with your COVID-19 vaccines. You had confirmed COVID-19 within the last 90 days (meaning you tested positive using a viral test). If you are up to date with COVID-19 vaccines, you should wear a well-fitting mask around others for 10 days from the date of your last close contact with someone with COVID-19 (the date of last close contact is considered day 0). Get tested at least 5 days after you last had close contact with someone with COVID-19. If you test positive or develop COVID-19 symptoms, isolate from other people and follow recommendations in the Isolation section below. If you tested  positive for COVID-19 with a viral test within the previous 90 days and subsequently recovered and remain without COVID-19 symptoms, you do not need to quarantine or get tested after close contact. You should wear a well-fitting mask around others for 10 days from the date of your last close contact with someone with  COVID-19 (the date of last close contact is considered day 0). If you have COVID-19 symptoms, get tested and isolate from other people and follow recommendations in the Isolation section below. Who should quarantine? If you come into close contact with someone with COVID-19, you should quarantine if you are not up to date on COVID-19 vaccines. This includes people who are not vaccinated. What to do for quarantine Stay home and away from other people for at least 5 days (day 0 through day 5) after your last contact with a person who has COVID-19. The date of your exposure is considered day 0. Wear a well-fitting mask when around others at home, if possible. For 10 days after your last close contact with someone with COVID-19, watch for fever (100.58F or greater), cough, shortness of breath, or other COVID-19 symptoms. If you develop symptoms, get tested immediately and isolate until you receive your test results. If you test positive, follow isolation recommendations. If you do not develop symptoms, get tested at least 5 days after you last had close contact with someone with COVID-19. If you test negative, you can leave your home, but continue to wear a well-fitting mask when around others at home and in public until 10 days after your last close contact with someone with COVID-19. If you test positive, you should isolate for at least 5 days from the date of your positive test (if you do not have symptoms). If you do develop COVID-19 symptoms, isolate for at least 5 days from the date your symptoms began (the date the symptoms started is day 0). Follow recommendations in the isolation section  below. If you are unable to get a test 5 days after last close contact with someone with COVID-19, you can leave your home after day 5 if you have been without COVID-19 symptoms throughout the 5-day period. Wear a well-fitting mask for 10 days after your date of last close contact when around others at home and in public. Avoid people who are have weakened immune systems or are more likely to get very sick from COVID-19, and nursing homes and other high-risk settings, until after at least 10 days. If possible, stay away from people you live with, especially people who are at higher risk for getting very sick from COVID-19, as well as others outside your home throughout the full 10 days after your last close contact with someone with COVID-19. If you are unable to quarantine, you should wear a well-fitting mask for 10 days when around others at home and in public. If you are unable to wear a mask when around others, you should continue to quarantine for 10 days. Avoid people who have weakened immune systems or are more likely to get very sick from COVID-19, and nursing homes and other high-risk settings, until after at least 10 days. See additional information about travel. Do not go to places where you are unable to wear a mask, such as restaurants and some gyms, and avoid eating around others at home and at work until after 10 days after your last close contact with someone with COVID-19. After quarantine Watch for symptoms until 10 days after your last close contact with someone with COVID-19. If you have symptoms, isolate immediately and get tested. Quarantine in high-risk congregate settings In certain congregate settings that have high risk of secondary transmission (such as Systems analyst and detention facilities, homeless shelters, or cruise ships), CDC recommends a 10-day quarantine for residents, regardless of vaccination and booster status. During  periods of critical staffing shortages,  facilities may consider shortening the quarantine period for staff to ensure continuity of operations. Decisions to shorten quarantine in these settings should be made in consultation with state, local, tribal, or territorial health departments and should take into consideration the context and characteristics of the facility. CDC's setting-specific guidance provides additional recommendations for these settings. Isolation Isolation is used to separate people with confirmed or suspected COVID-19 from those without COVID-19. People who are in isolation should stay home until it's safe for them to be around others. At home, anyone sick or infected should separate from others, or wear a well-fitting mask when they need to be around others. People in isolation should stay in a specific "sick room" or area and use a separate bathroom if available. Everyone who has presumed or confirmed COVID-19 should stay home and isolate from other people for at least 5 full days (day 0 is the first day of symptoms or the date of the day of the positive viral test for asymptomatic persons). They should wear a mask when around others at home and in public for an additional 5 days. People who are confirmed to have COVID-19 or are showing symptoms of COVID-19 need to isolate regardless of their vaccination status. This includes: People who have a positive viral test for COVID-19, regardless of whether or not they have symptoms. People with symptoms of COVID-19, including people who are awaiting test results or have not been tested. People with symptoms should isolate even if they do not know if they have been in close contact with someone with COVID-19. What to do for isolation Monitor your symptoms. If you have an emergency warning sign (including trouble breathing), seek emergency medical care immediately. Stay in a separate room from other household members, if possible. Use a separate bathroom, if possible. Take steps to  improve ventilation at home, if possible. Avoid contact with other members of the household and pets. Don't share personal household items, like cups, towels, and utensils. Wear a well-fitting mask when you need to be around other people. Learn more about what to do if you are sick and how to notify your contacts. Ending isolation for people who had COVID-19 and had symptoms If you had COVID-19 and had symptoms, isolate for at least 5 days. To calculate your 5-day isolation period, day 0 is your first day of symptoms. Day 1 is the first full day after your symptoms developed. You can leave isolation after 5 full days. You can end isolation after 5 full days if you are fever-free for 24 hours without the use of fever-reducing medication and your other symptoms have improved (Loss of taste and smell may persist for weeks or months after recovery and need not delay the end of isolation). You should continue to wear a well-fitting mask around others at home and in public for 5 additional days (day 6 through day 10) after the end of your 5-day isolation period. If you are unable to wear a mask when around others, you should continue to isolate for a full 10 days. Avoid people who have weakened immune systems or are more likely to get very sick from COVID-19, and nursing homes and other high-risk settings, until after at least 10 days. If you continue to have fever or your other symptoms have not improved after 5 days of isolation, you should wait to end your isolation until you are fever-free for 24 hours without the use of fever-reducing medication and your  other symptoms have improved. Continue to wear a well-fitting mask through day 10. Contact your healthcare provider if you have questions. See additional information about travel. Do not go to places where you are unable to wear a mask, such as restaurants and some gyms, and avoid eating around others at home and at work until a full 10 days after your  first day of symptoms. If an individual has access to a test and wants to test, the best approach is to use an antigen test1 towards the end of the 5-day isolation period. Collect the test sample only if you are fever-free for 24 hours without the use of fever-reducing medication and your other symptoms have improved (loss of taste and smell may persist for weeks or months after recovery and need not delay the end of isolation). If your test result is positive, you should continue to isolate until day 10. If your test result is negative, you can end isolation, but continue to wear a well-fitting mask around others at home and in public until day 10. Follow additional recommendations for masking and avoiding travel as described above. 1As noted in the labeling for authorized over-the counter antigen tests: Negative results should be treated as presumptive. Negative results do not rule out SARS-CoV-2 infection and should not be used as the sole basis for treatment or patient management decisions, including infection control decisions. To improve results, antigen tests should be used twice over a three-day period with at least 24 hours and no more than 48 hours between tests. Note that these recommendations on ending isolation do not apply to people who are moderately ill or very sick from COVID-19 or have weakened immune systems. See section below for recommendations for when to end isolation for these groups. Ending isolation for people who tested positive for COVID-19 but had no symptoms If you test positive for COVID-19 and never develop symptoms, isolate for at least 5 days. Day 0 is the day of your positive viral test (based on the date you were tested) and day 1 is the first full day after the specimen was collected for your positive test. You can leave isolation after 5 full days. If you continue to have no symptoms, you can end isolation after at least 5 days. You should continue to wear a well-fitting  mask around others at home and in public until day 10 (day 6 through day 10). If you are unable to wear a mask when around others, you should continue to isolate for 10 days. Avoid people who have weakened immune systems or are more likely to get very sick from COVID-19, and nursing homes and other high-risk settings, until after at least 10 days. If you develop symptoms after testing positive, your 5-day isolation period should start over. Day 0 is your first day of symptoms. Follow the recommendations above for ending isolation for people who had COVID-19 and had symptoms. See additional information about travel. Do not go to places where you are unable to wear a mask, such as restaurants and some gyms, and avoid eating around others at home and at work until 10 days after the day of your positive test. If an individual has access to a test and wants to test, the best approach is to use an antigen test1 towards the end of the 5-day isolation period. If your test result is positive, you should continue to isolate until day 10. If your test result is positive, you can also choose to test daily  and if your test result is negative, you can end isolation, but continue to wear a well-fitting mask around others at home and in public until day 10. Follow additional recommendations for masking and avoiding travel as described above. 1As noted in the labeling for authorized over-the counter antigen tests: Negative results should be treated as presumptive. Negative results do not rule out SARS-CoV-2 infection and should not be used as the sole basis for treatment or patient management decisions, including infection control decisions. To improve results, antigen tests should be used twice over a three-day period with at least 24 hours and no more than 48 hours between tests. Ending isolation for people who were moderately or very sick from COVID-19 or have a weakened immune system People who are moderately ill from  COVID-19 (experiencing symptoms that affect the lungs like shortness of breath or difficulty breathing) should isolate for 10 days and follow all other isolation precautions. To calculate your 10-day isolation period, day 0 is your first day of symptoms. Day 1 is the first full day after your symptoms developed. If you are unsure if your symptoms are moderate, talk to a healthcare provider for further guidance. People who are very sick from COVID-19 (this means people who were hospitalized or required intensive care or ventilation support) and people who have weakened immune systems might need to isolate at home longer. They may also require testing with a viral test to determine when they can be around others. CDC recommends an isolation period of at least 10 and up to 20 days for people who were very sick from COVID-19 and for people with weakened immune systems. Consult with your healthcare provider about when you can resume being around other people. If you are unsure if your symptoms are severe or if you have a weakened immune system, talk to a healthcare provider for further guidance. People who have a weakened immune system should talk to their healthcare provider about the potential for reduced immune responses to COVID-19 vaccines and the need to continue to follow current prevention measures (including wearing a well-fitting mask and avoiding crowds and poorly ventilated indoor spaces) to protect themselves against COVID-19 until advised otherwise by their healthcare provider. Close contacts of immunocompromised people--including household members--should also be encouraged to receive all recommended COVID-19 vaccine doses to help protect these people. Isolation in high-risk congregate settings In certain high-risk congregate settings that have high risk of secondary transmission and where it is not feasible to cohort people (such as Systems analyst and detention facilities, homeless shelters, and cruise  ships), CDC recommends a 10-day isolation period for residents. During periods of critical staffing shortages, facilities may consider shortening the isolation period for staff to ensure continuity of operations. Decisions to shorten isolation in these settings should be made in consultation with state, local, tribal, or territorial health departments and should take into consideration the context and characteristics of the facility. CDC's setting-specific guidance provides additional recommendations for these settings. This CDC guidance is meant to supplement--not replace--any federal, state, local, territorial, or tribal health and safety laws, rules, and regulations. Recommendations for specific settings These recommendations do not apply to healthcare professionals. For guidance specific to these settings, see Healthcare professionals: Interim Guidance for Optician, dispensing with SARS-CoV-2 Infection or Exposure to SARS-CoV-2 Patients, residents, and visitors to healthcare settings: Interim Infection Prevention and Control Recommendations for Healthcare Personnel During the [place] 2019 (COVID-19) Pandemic Additional setting-specific guidance and recommendations are available. These recommendations on quarantine and isolation do  apply to [place] settings. Additional guidance is available here: Overview of COVID-19 Quarantine for K-12 Schools Travelers: Travel information and recommendations Congregate facilities and other settings: [employer] for community, work, and school settings Ongoing COVID-19 exposure FAQs I live with someone with COVID-19, but I cannot be separated from them. How do we manage quarantine in this situation? It is very important for people with COVID-19 to remain apart from other people, if possible, even if they are living together. If separation of the person with COVID-19 from others that they live with is not possible, the other people that  they live with will have ongoing exposure, meaning they will be repeatedly exposed until that person is no longer able to spread the virus to other people. In this situation, there are precautions you can take to limit the spread of COVID-19: The person with COVID-19 and everyone they live with should wear a well-fitting mask inside the home. If possible, one person should care for the person with COVID-19 to limit the number of people who are in close contact with the infected person. Take steps to protect yourself and others to reduce transmission in the home: Quarantine if you are not up to date with your COVID-19 vaccines. Isolate if you are sick or tested positive for COVID-19, even if you don't have symptoms. Learn more about the public health recommendations for testing, mask use and quarantine of close contacts, like yourself, who have ongoing exposure. These recommendations differ depending on your vaccination status. What should I do if I have ongoing exposure to COVID-19 from someone I live with? Recommendations for this situation depend on your vaccination status: If you are not up to date on COVID-19 vaccines and have ongoing exposure to COVID-19, you should: Begin quarantine immediately and continue to quarantine throughout the isolation period of the person with COVID-19. Continue to quarantine for an additional 5 days starting the day after the end of isolation for the person with COVID-19. Get tested at least 5 days after the end of isolation of the infected person that lives with them. If you test negative, you can leave the home but should continue to wear a well-fitting mask when around others at home and in public until 10 days after the end of isolation for the person with COVID-19. Isolate immediately if you develop symptoms of COVID-19 or test positive. If you are up to date with COVID-19 vaccines and have ongoing exposure to COVID-19, you should: Get tested at least 5 days  after your first exposure. A person with COVID-19 is considered infectious starting 2 days before they develop symptoms, or 2 days before the date of their positive test if they do not have symptoms. Get tested again at least 5 days after the end of isolation for the person with COVID-19. Wear a well-fitting mask when you are around the person with COVID-19, and do this throughout their isolation period. Wear a well-fitting mask around others for 10 days after the infected person's isolation period ends. Isolate immediately if you develop symptoms of COVID-19 or test positive. What should I do if multiple people I live with test positive for COVID-19 at different times? Recommendations for this situation depend on your vaccination status: If you are not up to date with your COVID-19 vaccines, you should: Quarantine throughout the isolation period of any infected person that you live with. Continue to quarantine until 5 days after the end of isolation date for the most recently infected person that lives  with you. For example, if the last day of isolation of the person most recently infected with COVID-19 was June 30, the new 5-day quarantine period starts on July 1. Get tested at least 5 days after the end of isolation for the most recently infected person that lives with you. Wear a well-fitting mask when you are around any person with COVID-19 while that person is in isolation. Wear a well-fitting mask when you are around other people until 10 days after your last close contact. Isolate immediately if you develop symptoms of COVID-19 or test positive. If you are up to date with your COVID-19 vaccines, you should: Get tested at least 5 days after your first exposure. A person with COVID-19 is considered infectious starting 2 days before they developed symptoms, or 2 days before the date of their positive test if they do not have symptoms. Get tested again at least 5 days after the end of isolation  for the most recently infected person that lives with you. Wear a well-fitting mask when you are around any person with COVID-19 while that person is in isolation. Wear a well-fitting mask around others for 10 days after the end of isolation for the most recently infected person that lives with you. For example, if the last day of isolation for the person most recently infected with COVID-19 was June 30, the new 10-day period to wear a well-fitting mask indoors in public starts on July 1. Isolate immediately if you develop symptoms of COVID-19 or test positive. I had COVID-19 and completed isolation. Do I have to quarantine or get tested if someone I live with gets COVID-19 shortly after I completed isolation? No. If you recently completed isolation and someone that lives with you tests positive for the virus that causes COVID-19 shortly after the end of your isolation period, you do not have to quarantine or get tested as [place] as you do not develop new symptoms. Once all of the people that live together have completed isolation or quarantine, refer to the guidance below for new exposures to COVID-19. If you had COVID-19 in the previous 90 days and then came into close contact with someone with COVID-19, you do not have to quarantine or get tested if you do not have symptoms. But you should: Wear a well-fitting mask indoors in public for 10 days after your last close contact. Monitor for COVID-19 symptoms for 10 days from the date of your last close contact. Isolate immediately and get tested if symptoms develop. If more than 90 days have passed since your recovery from infection, follow CDC's recommendations for close contacts. These recommendations will differ depending on your vaccination status. 03/06/2021 Content source: [facility] Immunization and Respiratory Diseases ([facility]), Division of Viral Diseases This information is not intended to replace advice given to you by your health care  provider. Make sure you discuss any questions you have with your health care provider. Document Revised: 07/10/2021 Document Reviewed: 07/10/2021 Elsevier Patient Education  2022 [employer].    If you have been instructed to have an in-person evaluation today at a local Urgent Care facility, please use the link below. It will take you to a list of all of our available [facility], including address, phone number and hours of operation. Please do not delay care.  [facility]  If you or a family member do not have a primary care provider, use the link below to schedule a visit and establish care.  When you choose a [facility] primary care physician or advanced practice provider, you gain a Januszewski-term partner in health. Find a Primary Care Provider  Learn more about [facility]'s in-office and virtual care options: [facility]

## 2022-01-03 NOTE — Progress Notes (Signed)
Virtual Visit Consent   Patricia Lin, you are scheduled for a virtual visit with a [facility] provider today.     Just as with appointments in the office, your consent must be obtained to participate.  Your consent will be active for this visit and any virtual visit you may have with one of our providers in the next 365 days.     If you have a MyChart account, a copy of this consent can be sent to you electronically.  All virtual visits are billed to your insurance company just like a traditional visit in the office.    As this is a virtual visit, video technology does not allow for your provider to perform a traditional examination.  This may limit your provider's ability to fully assess your condition.  If your provider identifies any concerns that need to be evaluated in person or the need to arrange testing (such as labs, EKG, etc.), we will make arrangements to do so.     Although advances in technology are sophisticated, we cannot ensure that it will always work on either your end or our end.  If the connection with a video visit is poor, the visit may have to be switched to a telephone visit.  With either a video or telephone visit, we are not always able to ensure that we have a secure connection.     I need to obtain your verbal consent now.   Are you willing to proceed with your visit today?    Debbera Tri has provided verbal consent on 01/03/2022 for a virtual visit (video or telephone).   Mar Daring, PA-C   Date: 01/03/2022 9:30 AM   Virtual Visit via Video Note   I, Mar Daring, connected with  Patricia Lin  ([id number], 02-10-1981) on 01/03/22 at  9:15 AM EST by a video-enabled telemedicine application and verified that I am speaking with the correct person using two identifiers.  Location: Patient: Virtual Visit Location Patient: Home Provider: Virtual Visit Location Provider: Home Office   I discussed the limitations of evaluation and management by  telemedicine and the availability of in person appointments. The patient expressed understanding and agreed to proceed.    History of Present Illness: Patricia Lin is a 41 y.o. who identifies as a female who was assigned female at birth, and is being seen today for Covid 86.  HPI: URI  This is a new problem. Episode onset: Symptoms started 3 days ago; tested positive for Covid 19 2 days ago at [facility]. The problem has been gradually worsening. Associated symptoms include congestion, coughing and rhinorrhea. Pertinent negatives include no ear pain, headaches, plugged ear sensation, sinus pain or sore throat. Associated symptoms comments: Post nasal drainage.   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   Symptomatic anemia 09/03/2012   H/O pre-eclampsia in prior pregnancy, currently pregnant 08/19/2012   Chronic hypertension in pregnancy 08/19/2012   Hx LEEP (loop electrosurgical excision procedure), cervix, pregnancy 08/19/2012   Hx of postpartum depression, currently pregnant 08/19/2012   Late prenatal care 08/19/2012   Hx of precipitous  labor and deliveries, antepartum 08/19/2012   Muscle 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 08/19/2012    Allergies:  Allergies  Allergen Reactions   Latex Itching and Rash   Medications:  Current Outpatient Medications:    benzonatate (TESSALON) 100 MG capsule, Take 1 capsule (100 mg total) by mouth 3 (three) times daily as needed., Disp: 30 capsule, Rfl: 0   molnupiravir EUA (LAGEVRIO) 200 mg CAPS capsule, Take 4 capsules (800 mg total) by mouth 2 (two) times daily for 5 days., Disp: 40  capsule, Rfl: 0   predniSONE (DELTASONE) 20 MG tablet, Take 1 tablet (20 mg total) by mouth daily with breakfast., Disp: 5 tablet, Rfl: 0   albuterol (VENTOLIN HFA) 108 (90 Base) MCG/ACT inhaler, Inhale 2 puffs into the lungs every 6 (six) hours as needed for wheezing or shortness of breath., Disp: 8.5 g, Rfl: 0   amoxicillin-clavulanate (AUGMENTIN) 500-125 MG tablet, Take 1 tablet by mouth 3 (three) times daily., Disp: , Rfl:    brompheniramine-pseudoephedrine-DM 30-2-10 MG/5ML syrup, Take 5 mLs by mouth 4 (four) times daily as needed for up to 6 days., Disp: 120 mL, Rfl: 0   cetirizine (ZYRTEC ALLERGY) 10 MG tablet, Take 1 tablet (10 mg total) by mouth daily., Disp: 30 tablet, Rfl: 0   ferrous sulfate 325 (65 FE) MG tablet, Take 1 tablet (325 mg total) by mouth 3 (three) times daily with meals., Disp: 90 tablet, Rfl: 3   fluticasone (FLONASE) 50 MCG/ACT nasal spray, Place 1 spray into both nostrils daily., Disp: 16 g, Rfl: 0   fluticasone (FLONASE) 50 MCG/ACT nasal spray, Place 2 sprays into both nostrils daily., Disp: 16 g, Rfl: 6   ibuprofen (ADVIL) 800 MG tablet, Take 1 tablet (800 mg total) by mouth 3 (three) times daily., Disp: 30 tablet, Rfl: 0   Vitamin D, Ergocalciferol, (DRISDOL) 1.25 MG (50000 UNIT) CAPS capsule, Take 1 capsule (50,000 Units total) by mouth every 7 (seven) days., Disp: 4 capsule, Rfl: 0  Observations/Objective: Patient is well-developed, well-nourished in no acute distress.  Resting comfortably at home.  Head is normocephalic, atraumatic.  No labored breathing.  Speech is clear and coherent with logical content.  Patient is alert and oriented at baseline.    Assessment and Plan: 1. COVID-19 - MyChart COVID-19 home monitoring program; Future - molnupiravir EUA (LAGEVRIO) 200 mg CAPS capsule; Take 4 capsules (800 mg total) by mouth 2 (two) times daily for 5 days.  Dispense: 40 capsule; Refill: 0 - benzonatate (TESSALON) 100 MG capsule; Take 1 capsule (100 mg total)  by mouth 3 (three) times daily as needed.  Dispense: 30 capsule; Refill: 0 - predniSONE (DELTASONE) 20 MG tablet; Take 1 tablet (20 mg total) by mouth daily with breakfast.  Dispense: 5 tablet; Refill: 0  - Continue OTC symptomatic management of choice - Will send OTC vitamins and supplement information through AVS - Molnupiravir, tessalon perles, and prednisone prescribed - Patient enrolled in MyChart symptom monitoring - Push fluids - Rest as needed - Discussed return precautions and when to seek in-person evaluation, sent via AVS as well  Follow Up Instructions: I discussed the assessment and treatment plan with the patient. The patient was provided an opportunity to ask questions and all were answered. The patient agreed with the plan and demonstrated an understanding of the instructions.  A copy of instructions were sent to the patient via MyChart unless otherwise noted below.   The patient was advised to call back or seek an in-person evaluation if the symptoms worsen or  if the condition fails to improve as anticipated.  Time:  I spent 15 minutes with the patient via telehealth technology discussing the above problems/concerns.    Mar Daring, PA-C

## 2022-04-26 NOTE — Progress Notes (Signed)
Subjective:    Patricia Lin - 41 y.o. female MRN [id number]  Date of birth: [birth year]  HPI  Patricia Lin is to establish care and annual physical exam.   Current issues and/or concerns: - Interested in weight loss medication. Tried Phentermine in the past which caused jitters. Will think about if she would like to pay out of pocket for [facility]. Denies family/personal history of thyroid cancer. Goal weight 170 pounds. Planning to establish with Dennard Nip, MD at weight loss clinic soon.  - Reports family history of heart issues. States "I have a leaky heart valve." Reports seen by Cardiology in the past and told to follow-up in 4 years but was unable to do so.  - Bilateral cerumen impaction. Reports doesn't do well with curettes.   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 She has never used  smokeless tobacco. She reports current alcohol use. She reports that she does not use drugs.   Family History  family history includes Alcohol abuse in her maternal uncle; Anxiety disorder in her mother; Asthma in her daughter and mother; Coronary artery disease in her mother; Depression in her mother; Diabetes in her mother; Drug abuse in her maternal uncle; Eating disorder in her mother; Heart disease in her mother; Hyperlipidemia in her mother; Hypertension in her father, maternal grandfather, maternal grandmother, and mother; Obesity in her mother; Sleep apnea in her mother.   Medications: reviewed and updated   Objective:   Physical Exam BP 118/83 (BP Location: Left Arm, Patient Position: Sitting, Cuff Size: Large)   Pulse 83   Temp 98.3 F (36.8 C)   Resp 18   Ht 5' 2"  (1.575 m)   Wt 204 lb (92.5 kg)   SpO2 97%   BMI 37.31 kg/m   Physical Exam HENT:     Head: Normocephalic and atraumatic.     Right Ear: Tympanic membrane, ear canal and external ear normal.     Left Ear: Tympanic membrane, ear canal and external ear normal.     Nose: Nose normal.     Mouth/Throat:     Mouth: Mucous membranes are moist.     Pharynx: Oropharynx is clear.  Eyes:     Extraocular Movements: Extraocular movements intact.     Conjunctiva/sclera: Conjunctivae normal.     Pupils: Pupils are equal, round, and reactive to light.  Cardiovascular:  Rate and Rhythm: Normal rate and regular rhythm.     Pulses: Normal pulses.     Heart sounds: Normal heart sounds.  Pulmonary:     Effort: Pulmonary effort is normal.     Breath sounds: Normal breath sounds.  Chest:     Comments: Patient declined.  Abdominal:     General: Bowel sounds are normal.     Palpations: Abdomen is soft.  Genitourinary:    General: Normal vulva.     Vagina: Normal.     Cervix: Normal.     Uterus: Normal.      Adnexa: Right adnexa normal and left adnexa normal.     Comments: Elmon Else, CMA present during exam.   Musculoskeletal:        General: Normal range of motion.     Cervical back: Normal range of motion and neck supple.  Skin:    General: Skin is warm and dry.     Capillary Refill: Capillary refill takes less than 2 seconds.  Neurological:     General: No focal deficit present.     Mental Status: She is alert and oriented to person, place, and time.  Psychiatric:        Mood and Affect: Mood normal.        Behavior: Behavior normal.      Assessment & Plan:  1. Encounter to establish care: 2. Annual physical exam: - Counseled on 150 minutes of exercise per week as tolerated, healthy eating (including decreased daily intake of saturated fats, cholesterol, added sugars, sodium), STI prevention, and routine healthcare maintenance.  3. Screening for metabolic disorder: - [id number] to check kidney function, liver function, and electrolyte balance.  - CMP14+EGFR  4. Screening for deficiency anemia: - CBC to screen for anemia. - CBC  5. Diabetes mellitus screening: - Hemoglobin A1c to screen for pre-diabetes/diabetes. - Hemoglobin A1c  6. Screening cholesterol level: - Lipid panel to screen for high cholesterol.  - Lipid panel  7. Thyroid disorder screen: - TSH to check thyroid function.  - TSH  8. Need for hepatitis C screening test: - Hepatitis C antibody to screen for hepatitis C.  - Hepatitis C Antibody  9. Encounter for screening mammogram for malignant neoplasm of breast: - Referral for breast cancer screening by mammogram.  - MM Digital Screening; Future  10. Pap smear for cervical cancer screening: - Cytology - PAP for cervical cancer screening.  - Cytology - PAP([place])  11. Routine screening for STI (sexually transmitted infection): - Cervicovaginal self-swab to screen for chlamydia, gonorrhea, trichomonas, bacterial vaginitis, and candida vaginitis. - Cervicovaginal ancillary only  12. Screening for HIV (human immunodeficiency virus): - HIV antibody to  screen for human immunodeficiency virus.  - HIV Antibody (routine testing w rflx)  13. Encounter for weight management: - Patient reports she is interested in weight loss medication. Tried Phentermine in the past which caused jitters. Reports she will think about if she would like to pay out of pocket for [facility] and follow-up with primary provider upon making a decision. Reports she is planning to establish with Dennard Nip, MD at weight loss clinic soon.  - Counseled on medication adherence and adverse effects of Wegovy. Patient verbalized understanding. - Counseled weight loss goal of 5% within 12 weeks or recommendation to discontinue regimen. Also, counseled on the importance of weight checks every 4 weeks. Patient verbalized understanding. - Counseled on low-sodium, DASH diet, and 150 minutes of moderate intensity exercise per week as tolerated.  76. Family history of heart disease: - Referral to Cardiology for further evaluation and management.  - Ambulatory referral to Cardiology  15. Ceruminosis, bilateral: - Referral to ENT for further evaluation and management. - Ambulatory referral to ENT     Patient was given clear instructions to go to Emergency Department or return to medical center if symptoms don't improve, worsen, or new problems develop.The patient verbalized understanding.  I discussed the assessment and treatment plan with the patient. The patient was provided an opportunity to ask questions and all were answered. The patient agreed with the plan and demonstrated an understanding of the instructions.   The patient was advised to call back or seek an in-person evaluation if the symptoms worsen or if the condition fails to improve as anticipated.    Durene Fruits, NP 05/02/2022, 11:30 AM Primary Care at [facility]

## 2022-05-02 ENCOUNTER — Ambulatory Visit: Payer: No Typology Code available for payment source | Admitting: Family

## 2022-05-02 ENCOUNTER — Other Ambulatory Visit (HOSPITAL_COMMUNITY)
Admission: RE | Admit: 2022-05-02 | Discharge: 2022-05-02 | Disposition: A | Discharge status: Home / Self Care | Payer: No Typology Code available for payment source | Source: Ambulatory Visit | Attending: Family Medicine | Admitting: Family Medicine

## 2022-05-02 ENCOUNTER — Encounter: Payer: Self-pay | Admitting: Family

## 2022-05-02 VITALS — BP 118/83 | HR 83 | Temp 98.3°F | Resp 18 | Ht 62.0 in | Wt 204.0 lb

## 2022-05-02 DIAGNOSIS — Z1231 Encounter for screening mammogram for malignant neoplasm of breast: Secondary | ICD-10-CM

## 2022-05-02 DIAGNOSIS — Z13228 Encounter for screening for other metabolic disorders: Secondary | ICD-10-CM

## 2022-05-02 DIAGNOSIS — Z130 Encounter for screening for diseases of the blood and blood-forming organs and certain disorders involving the immune mechanism: Secondary | ICD-10-CM

## 2022-05-02 DIAGNOSIS — Z113 Encounter for screening for infections with a predominantly sexual mode of transmission: Secondary | ICD-10-CM

## 2022-05-02 DIAGNOSIS — Z0000 Encounter for general adult medical examination without abnormal findings: Secondary | ICD-10-CM

## 2022-05-02 DIAGNOSIS — H6123 Impacted cerumen, bilateral: Secondary | ICD-10-CM

## 2022-05-02 DIAGNOSIS — Z1159 Encounter for screening for other viral diseases: Secondary | ICD-10-CM

## 2022-05-02 DIAGNOSIS — Z1329 Encounter for screening for other suspected endocrine disorder: Secondary | ICD-10-CM

## 2022-05-02 DIAGNOSIS — Z8249 Family history of ischemic heart disease and other diseases of the circulatory system: Secondary | ICD-10-CM

## 2022-05-02 DIAGNOSIS — Z7689 Persons encountering health services in other specified circumstances: Secondary | ICD-10-CM

## 2022-05-02 DIAGNOSIS — Z0001 Encounter for general adult medical examination with abnormal findings: Secondary | ICD-10-CM

## 2022-05-02 DIAGNOSIS — Z114 Encounter for screening for human immunodeficiency virus [HIV]: Secondary | ICD-10-CM

## 2022-05-02 DIAGNOSIS — Z131 Encounter for screening for diabetes mellitus: Secondary | ICD-10-CM

## 2022-05-02 DIAGNOSIS — Z124 Encounter for screening for malignant neoplasm of cervix: Secondary | ICD-10-CM

## 2022-05-02 DIAGNOSIS — Z13220 Encounter for screening for lipoid disorders: Secondary | ICD-10-CM

## 2022-05-02 NOTE — Progress Notes (Signed)
Pt presents to establish care and annual physical w/pap  Pt would like to discuss weight loss options  REQUEST FOR HIV LAB TO BE ADDED

## 2022-05-02 NOTE — Patient Instructions (Signed)
Preventive Care 40-42 Years Old, Female Preventive care refers to lifestyle choices and visits with your health care provider that can promote health and wellness. Preventive care visits are also called wellness exams. What can I expect for my preventive care visit? Counseling Your health care provider may ask you questions about your: Medical history, including: Past medical problems. Family medical history. Pregnancy history. Current health, including: Menstrual cycle. Method of birth control. Emotional well-being. Home life and relationship well-being. Sexual activity and sexual health. Lifestyle, including: Alcohol, nicotine or tobacco, and drug use. Access to firearms. Diet, exercise, and sleep habits. Work and work environment. Sunscreen use. Safety issues such as seatbelt and bike helmet use. Physical exam Your health care provider will check your: Height and weight. These may be used to calculate your BMI (body mass index). BMI is a measurement that tells if you are at a healthy weight. Waist circumference. This measures the distance around your waistline. This measurement also tells if you are at a healthy weight and may help predict your risk of certain diseases, such as type 2 diabetes and high blood pressure. Heart rate and blood pressure. Body temperature. Skin for abnormal spots. What immunizations do I need?  Vaccines are usually given at various ages, according to a schedule. Your health care provider will recommend vaccines for you based on your age, medical history, and lifestyle or other factors, such as travel or where you work. What tests do I need? Screening Your health care provider may recommend screening tests for certain conditions. This may include: Lipid and cholesterol levels. Diabetes screening. This is done by checking your blood sugar (glucose) after you have not eaten for a while (fasting). Pelvic exam and Pap test. Hepatitis B test. Hepatitis C  test. HIV (human immunodeficiency virus) test. STI (sexually transmitted infection) testing, if you are at risk. Lung cancer screening. Colorectal cancer screening. Mammogram. Talk with your health care provider about when you should start having regular mammograms. This may depend on whether you have a family history of breast cancer. BRCA-related cancer screening. This may be done if you have a family history of breast, ovarian, tubal, or peritoneal cancers. Bone density scan. This is done to screen for osteoporosis. Talk with your health care provider about your test results, treatment options, and if necessary, the need for more tests. Follow these instructions at home: Eating and drinking  Eat a diet that includes fresh fruits and vegetables, whole grains, lean protein, and low-fat dairy products. Take vitamin and mineral supplements as recommended by your health care provider. Do not drink alcohol if: Your health care provider tells you not to drink. You are pregnant, may be pregnant, or are planning to become pregnant. If you drink alcohol: Limit how much you have to 0-1 drink a day. Know how much alcohol is in your drink. In the [place] one drink equals one 12 oz bottle of beer (355 mL), one 5 oz glass of wine (148 mL), or one 1 oz glass of hard liquor (44 mL). Lifestyle Brush your teeth every morning and night with fluoride toothpaste. Floss one time each day. Exercise for at least 30 minutes 5 or more days each week. Do not use any products that contain nicotine or tobacco. These products include cigarettes, chewing tobacco, and vaping devices, such as e-cigarettes. If you need help quitting, ask your health care provider. Do not use drugs. If you are sexually active, practice safe sex. Use a condom or other form of protection to   prevent STIs. If you do not wish to become pregnant, use a form of birth control. If you plan to become pregnant, see your health care provider for a  prepregnancy visit. Take aspirin only as told by your health care provider. Make sure that you understand how much to take and what form to take. Work with your health care provider to find out whether it is safe and beneficial for you to take aspirin daily. Find healthy ways to manage stress, such as: Meditation, yoga, or listening to music. Journaling. Talking to a trusted person. Spending time with friends and family. Minimize exposure to UV radiation to reduce your risk of skin cancer. Safety Always wear your seat belt while driving or riding in a vehicle. Do not drive: If you have been drinking alcohol. Do not ride with someone who has been drinking. When you are tired or distracted. While texting. If you have been using any mind-altering substances or drugs. Wear a helmet and other protective equipment during sports activities. If you have firearms in your house, make sure you follow all gun safety procedures. Seek help if you have been physically or sexually abused. What's next? Visit your health care provider once a year for an annual wellness visit. Ask your health care provider how often you should have your eyes and teeth checked. Stay up to date on all vaccines. This information is not intended to replace advice given to you by your health care provider. Make sure you discuss any questions you have with your health care provider. Document Revised: 05/22/2021 Document Reviewed: 05/22/2021 Elsevier Patient Education  [place].

## 2022-05-03 ENCOUNTER — Encounter: Payer: Self-pay | Admitting: Family

## 2022-05-03 LAB — CMP14+EGFR
ALT: 32 IU/L (ref 0–32)
AST: 29 IU/L (ref 0–40)
Albumin/Globulin Ratio: 1.4 (ref 1.2–2.2)
Albumin: 4.5 g/dL (ref 3.8–4.8)
Alkaline Phosphatase: 96 IU/L (ref 44–121)
BUN/Creatinine Ratio: 20 (ref 9–23)
BUN: 14 mg/dL (ref 6–24)
Bilirubin Total: 0.6 mg/dL (ref 0.0–1.2)
CO2: 23 mmol/L (ref 20–29)
Calcium: 9.5 mg/dL (ref 8.7–10.2)
Chloride: 103 mmol/L (ref 96–106)
Creatinine, Ser: 0.7 mg/dL (ref 0.57–1.00)
Globulin, Total: 3.2 g/dL (ref 1.5–4.5)
Glucose: 98 mg/dL (ref 70–99)
Potassium: 4 mmol/L (ref 3.5–5.2)
Sodium: 140 mmol/L (ref 134–144)
Total Protein: 7.7 g/dL (ref 6.0–8.5)
eGFR: 111 mL/min/{1.73_m2} (ref >59)

## 2022-05-03 LAB — LIPID PANEL
Chol/HDL Ratio: 2.7 ratio (ref 0.0–4.4)
Cholesterol, Total: 184 mg/dL (ref 100–199)
HDL: 68 mg/dL (ref >39)
LDL Chol Calc (NIH): 106 mg/dL — ABNORMAL HIGH (ref 0–99)
Triglycerides: 53 mg/dL (ref 0–149)
VLDL Cholesterol Cal: 10 mg/dL (ref 5–40)

## 2022-05-03 LAB — CBC
Hematocrit: 27.3 % — ABNORMAL LOW (ref 34.0–46.6)
Hemoglobin: 7.5 g/dL — ABNORMAL LOW (ref 11.1–15.9)
MCH: 18.2 pg — ABNORMAL LOW (ref 26.6–33.0)
MCHC: 27.5 g/dL — ABNORMAL LOW (ref 31.5–35.7)
MCV: 66 fL — ABNORMAL LOW (ref 79–97)
Platelets: 387 10*3/uL (ref 150–450)
RBC: 4.13 x10E6/uL (ref 3.77–5.28)
RDW: 20 % — ABNORMAL HIGH (ref 11.7–15.4)
WBC: 3.8 10*3/uL (ref 3.4–10.8)

## 2022-05-03 LAB — HEMOGLOBIN A1C
Est. average glucose Bld gHb Est-mCnc: 111 mg/dL
Hgb A1c MFr Bld: 5.5 % (ref 4.8–5.6)

## 2022-05-03 LAB — TSH: TSH: 0.892 u[IU]/mL (ref 0.450–4.500)

## 2022-05-03 LAB — HEPATITIS C ANTIBODY: Hep C Virus Ab: NONREACTIVE

## 2022-05-03 NOTE — Progress Notes (Signed)
-   Kidney function and electrolytes normal. - Liver function normal.  - Thyroid function normal.  - No diabetes.  - Hepatitis C negative.   The following abnormalities are noted:   - Cholesterol higher than expected. High cholesterol may increase risk of heart attack and/or stroke. Consider eating more fruits, vegetables, and lean baked meats such as chicken or fish. Moderate intensity exercise at least 150 minutes as tolerated per week may help as well. However, your risk of heart attack/stroke in ten years is low so does not need to start a medication at the moment. - Complete blood count with evidence of anemia.   All other values are normal, stable or within acceptable limits.  Medication changes / Follow up labs / Other changes or recommendations:   - Recheck fasting cholesterol in 3 to 6 months.  - Report immediately to the emergency department for evaluation of need for blood transfusion.   The following is for provider reference only: The 10-year ASCVD risk score (Arnett DK, et al., 2019) is: 0.2%   Values used to calculate the score:     Age: 41 years     Sex: Female     Is Non-Hispanic African American: Yes     Diabetic: No     Tobacco smoker: No     Systolic Blood Pressure: 123456 mmHg     Is BP treated: No     HDL Cholesterol: 68 mg/dL     Total Cholesterol: 184 mg/dL  Camillia Herter, NP 05/03/2022 9:38 AM

## 2022-05-05 ENCOUNTER — Emergency Department (HOSPITAL_COMMUNITY)
Admission: EM | Admit: 2022-05-05 | Discharge: 2022-05-05 | Disposition: A | Discharge status: Home / Self Care | Payer: No Typology Code available for payment source | Attending: Emergency Medicine | Admitting: Emergency Medicine

## 2022-05-05 ENCOUNTER — Other Ambulatory Visit: Payer: Self-pay

## 2022-05-05 DIAGNOSIS — D509 Iron deficiency anemia, unspecified: Secondary | ICD-10-CM | POA: Diagnosis not present

## 2022-05-05 DIAGNOSIS — N9489 Other specified conditions associated with female genital organs and menstrual cycle: Secondary | ICD-10-CM | POA: Insufficient documentation

## 2022-05-05 DIAGNOSIS — Z91040 Latex allergy status: Secondary | ICD-10-CM | POA: Diagnosis not present

## 2022-05-05 DIAGNOSIS — I10 Essential (primary) hypertension: Secondary | ICD-10-CM | POA: Diagnosis not present

## 2022-05-05 DIAGNOSIS — R799 Abnormal finding of blood chemistry, unspecified: Secondary | ICD-10-CM | POA: Diagnosis present

## 2022-05-05 LAB — TYPE AND SCREEN
ABO/RH(D): A POS
Antibody Screen: NEGATIVE

## 2022-05-05 LAB — COMPREHENSIVE METABOLIC PANEL
ALT: 29 U/L (ref 0–44)
AST: 29 U/L (ref 15–41)
Albumin: 3.3 g/dL — ABNORMAL LOW (ref 3.5–5.0)
Alkaline Phosphatase: 74 U/L (ref 38–126)
Anion gap: 4 — ABNORMAL LOW (ref 5–15)
BUN: 12 mg/dL (ref 6–20)
CO2: 24 mmol/L (ref 22–32)
Calcium: 9.2 mg/dL (ref 8.9–10.3)
Chloride: 111 mmol/L (ref 98–111)
Creatinine, Ser: 0.66 mg/dL (ref 0.44–1.00)
GFR, Estimated: >60 mL/min (ref >60)
Glucose, Bld: 109 mg/dL — ABNORMAL HIGH (ref 70–99)
Potassium: 3.9 mmol/L (ref 3.5–5.1)
Sodium: 139 mmol/L (ref 135–145)
Total Bilirubin: 0.4 mg/dL (ref 0.3–1.2)
Total Protein: 7.1 g/dL (ref 6.5–8.1)

## 2022-05-05 LAB — CBC
HCT: 27.6 % — ABNORMAL LOW (ref 36.0–46.0)
Hemoglobin: 7.4 g/dL — ABNORMAL LOW (ref 12.0–15.0)
MCH: 18.1 pg — ABNORMAL LOW (ref 26.0–34.0)
MCHC: 26.8 g/dL — ABNORMAL LOW (ref 30.0–36.0)
MCV: 67.6 fL — ABNORMAL LOW (ref 80.0–100.0)
Platelets: 332 10*3/uL (ref 150–400)
RBC: 4.08 MIL/uL (ref 3.87–5.11)
RDW: 20.8 % — ABNORMAL HIGH (ref 11.5–15.5)
WBC: 3.8 10*3/uL — ABNORMAL LOW (ref 4.0–10.5)
nRBC: 0 % (ref 0.0–0.2)

## 2022-05-05 LAB — I-STAT BETA HCG BLOOD, ED (MC, WL, AP ONLY): I-stat hCG, quantitative: <5 m[IU]/mL (ref <5)

## 2022-05-05 NOTE — ED Provider Notes (Signed)
[facility] EMERGENCY DEPARTMENT Provider Note   CSN: [id number] Arrival date & time: 05/05/22  1307     History  Chief Complaint  Patient presents with   Abnormal Lab    Patricia Lin is a 41 y.o. female with a history of iron deficiency anemia and HTN presented to the ED with concerns for anemia.  Patient states that she had a physical with her PCP this past week and blood work was drawn.  Her hemoglobin was found to be 7.5, so her PCP instructed her to come to the ED for possible blood transfusion.  Patient states that about 3 years ago she did require a blood transfusion for low blood levels.  She has also previously been on iron supplementation, but is not currently taking iron.  She states that she has a history of heavy periods, but this is unchanged from prior.  Her last period finished last week.  She denies any lightheadedness, shortness of breath, or chest pain.  She does endorse fatigue that has been ongoing for many months.   Abnormal Lab     Home Medications Prior to Admission medications   Medication Sig Start Date End Date Taking? Authorizing Provider  ferrous sulfate 325 (65 FE) MG tablet Take 1 tablet (325 mg total) by mouth 3 (three) times daily with meals. 12/14/19   Wilder Glade, MD      Allergies    Latex    Review of Systems   Review of Systems  Constitutional:  Positive for fatigue. Negative for fever.  Respiratory:  Negative for shortness of breath.   Cardiovascular:  Negative for chest pain.  Gastrointestinal:  Negative for abdominal pain.  Neurological:  Negative for syncope and light-headedness.   Physical Exam Updated Vital Signs BP (!) 165/106 (BP Location: Right Arm)   Pulse 75   Temp 97.9 F (36.6 C)   Resp 18   LMP 05/01/2022 (Exact Date)   SpO2 100%  Physical Exam Constitutional:      General: She is not in acute distress.    Appearance: She is obese. She is not toxic-appearing or diaphoretic.  HENT:     Head:  Normocephalic and atraumatic.     Nose: Nose normal.     Mouth/Throat:     Mouth: Mucous membranes are moist.  Eyes:     General: No scleral icterus. Cardiovascular:     Rate and Rhythm: Normal rate and regular rhythm.     Heart sounds: Normal heart sounds. No murmur heard.   No friction rub. No gallop.  Pulmonary:     Effort: Pulmonary effort is normal. No respiratory distress.     Breath sounds: Normal breath sounds. No stridor. No wheezing, rhonchi or rales.  Abdominal:     Palpations: Abdomen is soft.     Tenderness: There is no abdominal tenderness. There is no guarding or rebound.  Musculoskeletal:        General: No deformity.     Cervical back: Neck supple.  Skin:    General: Skin is warm and dry.  Neurological:     General: No focal deficit present.     Mental Status: She is alert and oriented to person, place, and time.    ED Results / Procedures / Treatments   Labs (all labs ordered are listed, but only abnormal results are displayed) Labs Reviewed  COMPREHENSIVE METABOLIC PANEL - Abnormal; Notable for the following components:      Result Value   Glucose,  Bld 109 (*)    Albumin 3.3 (*)    Anion gap 4 (*)    All other components within normal limits  CBC - Abnormal; Notable for the following components:   WBC 3.8 (*)    Hemoglobin 7.4 (*)    HCT 27.6 (*)    MCV 67.6 (*)    MCH 18.1 (*)    MCHC 26.8 (*)    RDW 20.8 (*)    All other components within normal limits  I-STAT BETA HCG BLOOD, ED ([facility], [facility], [facility] ONLY)  POC OCCULT BLOOD, ED  TYPE AND SCREEN    EKG None  Radiology No results found.  Procedures Procedures    Medications Ordered in ED Medications - No data to display  ED Course/ Medical Decision Making/ A&P                           Medical Decision Making Amount and/or Complexity of Data Reviewed Labs: ordered.   41 year old female with a history of iron deficiency anemia and hypertension presenting to the ED with concerns for  anemia.  On exam, the patient is afebrile and hemodynamically stable.  She is overall well-appearing and has a normal pulmonary and cardiac exam.  On chart review, it does appear that patient has previously been on iron supplementations for iron deficiency anemia, but states that she is not currently on this medication.  Her repeat hemoglobin today is 7.4, about stable from a few days ago.  MCV is low at 67.6.  The rest of her lab work-up is unremarkable.  She does endorse fatigue for the past several months, but denies any chest pain, shortness of breath, or lightheadedness.  I do not think she requires an emergent transfusion at this time.  I have recommended that she restart her iron supplementation, 1 tablet 3 times daily.  I also recommended she follow-up with her primary care doctor in the next 1 to 2 weeks for reevaluation.  I also recommended that she follow-up with her OB/GYN as soon as possible to further discuss her heavy menstrual bleeding.  Patient verbalizes understanding.  Strict return precautions were discussed and the patient was discharged home in stable condition.        Final Clinical Impression(s) / ED Diagnoses Final diagnoses:  Iron deficiency anemia, unspecified iron deficiency anemia type    Rx / DC Orders ED Discharge Orders     None         Laurence Compton, MD 05/05/22 1734    Alvira Monday, MD 05/06/22 1241

## 2022-05-05 NOTE — ED Triage Notes (Addendum)
Pt from PCP for possible blood transfusion. When blood was drawn on 5/26 she had just come off of her menstrual cycle and she has heavy bleeding for those days. Hgb 7.5 at that time. Patient has had to receive blood in the past but approximately three years ago.  Reports she feels tired all the time and has a headache, but nothing has changed recently in how she feels.

## 2022-05-05 NOTE — Discharge Instructions (Addendum)
Start iron supplementation, 1 tablet, three times a day. Please follow-up with your Primary Care Doctor in 1-2 weeks. Please return to the Emergency Department if you develop severe shortness of breath, chest pain, or lightheadedness. Please also schedule a follow-up appointment with your OB/GYN as soon as possible to discuss your heavy menstrual bleeding. Speak with your Primary Care Doctor abut a possible Cardiology referral or an outpatient echo.

## 2022-05-06 LAB — CYTOLOGY - PAP
Adequacy: ABSENT
Comment: NEGATIVE
Diagnosis: NEGATIVE
High risk HPV: NEGATIVE

## 2022-05-06 LAB — CERVICOVAGINAL ANCILLARY ONLY
Bacterial Vaginitis (gardnerella): NEGATIVE
Candida Glabrata: NEGATIVE
Candida Vaginitis: NEGATIVE
Chlamydia: NEGATIVE
Comment: NEGATIVE
Comment: NEGATIVE
Comment: NEGATIVE
Comment: NEGATIVE
Comment: NEGATIVE
Comment: NORMAL
Neisseria Gonorrhea: NEGATIVE
Trichomonas: NEGATIVE

## 2022-05-06 NOTE — Progress Notes (Signed)
Gonorrhea, Chlamydia, Trichomonas, Candida Vaginitis (yeast infection), and Bacterial Vaginitis negative.

## 2022-05-06 NOTE — Progress Notes (Signed)
PAP negative for lesion and malignancy. HPV negative.

## 2022-05-07 ENCOUNTER — Other Ambulatory Visit: Payer: Self-pay | Admitting: Internal Medicine

## 2022-05-07 ENCOUNTER — Ambulatory Visit: Payer: No Typology Code available for payment source | Admitting: Obstetrics and Gynecology

## 2022-05-07 ENCOUNTER — Inpatient Hospital Stay: Payer: No Typology Code available for payment source | Attending: Internal Medicine | Admitting: Internal Medicine

## 2022-05-07 ENCOUNTER — Telehealth: Payer: Self-pay | Admitting: Internal Medicine

## 2022-05-07 ENCOUNTER — Encounter: Payer: Self-pay | Admitting: Obstetrics and Gynecology

## 2022-05-07 ENCOUNTER — Inpatient Hospital Stay: Payer: No Typology Code available for payment source

## 2022-05-07 ENCOUNTER — Telehealth: Payer: Self-pay | Admitting: *Deleted

## 2022-05-07 ENCOUNTER — Other Ambulatory Visit: Payer: Self-pay

## 2022-05-07 VITALS — BP 148/94 | HR 74 | Ht 62.0 in | Wt 203.0 lb

## 2022-05-07 VITALS — BP 147/95 | HR 94 | Temp 98.4°F | Resp 16 | Ht 62.0 in | Wt 205.2 lb

## 2022-05-07 DIAGNOSIS — N920 Excessive and frequent menstruation with regular cycle: Secondary | ICD-10-CM

## 2022-05-07 DIAGNOSIS — I10 Essential (primary) hypertension: Secondary | ICD-10-CM | POA: Insufficient documentation

## 2022-05-07 DIAGNOSIS — Z79899 Other long term (current) drug therapy: Secondary | ICD-10-CM | POA: Diagnosis not present

## 2022-05-07 DIAGNOSIS — D539 Nutritional anemia, unspecified: Secondary | ICD-10-CM

## 2022-05-07 DIAGNOSIS — D500 Iron deficiency anemia secondary to blood loss (chronic): Secondary | ICD-10-CM

## 2022-05-07 DIAGNOSIS — Z0289 Encounter for other administrative examinations: Secondary | ICD-10-CM

## 2022-05-07 DIAGNOSIS — N949 Unspecified condition associated with female genital organs and menstrual cycle: Secondary | ICD-10-CM | POA: Diagnosis not present

## 2022-05-07 LAB — VITAMIN B12: Vitamin B-12: 553 pg/mL (ref 180–914)

## 2022-05-07 LAB — CBC WITH DIFFERENTIAL (CANCER CENTER ONLY)
Abs Immature Granulocytes: 0.02 10*3/uL (ref 0.00–0.07)
Basophils Absolute: 0.1 10*3/uL (ref 0.0–0.1)
Basophils Relative: 1 %
Eosinophils Absolute: 0.1 10*3/uL (ref 0.0–0.5)
Eosinophils Relative: 1 %
HCT: 27.2 % — ABNORMAL LOW (ref 36.0–46.0)
Hemoglobin: 7.8 g/dL — ABNORMAL LOW (ref 12.0–15.0)
Immature Granulocytes: 0 %
Lymphocytes Relative: 30 %
Lymphs Abs: 1.8 10*3/uL (ref 0.7–4.0)
MCH: 18.7 pg — ABNORMAL LOW (ref 26.0–34.0)
MCHC: 28.7 g/dL — ABNORMAL LOW (ref 30.0–36.0)
MCV: 65.2 fL — ABNORMAL LOW (ref 80.0–100.0)
Monocytes Absolute: 0.4 10*3/uL (ref 0.1–1.0)
Monocytes Relative: 7 %
Neutro Abs: 3.6 10*3/uL (ref 1.7–7.7)
Neutrophils Relative %: 61 %
Platelet Count: 361 10*3/uL (ref 150–400)
RBC: 4.17 MIL/uL (ref 3.87–5.11)
RDW: 21.2 % — ABNORMAL HIGH (ref 11.5–15.5)
Smear Review: NORMAL
WBC Count: 6 10*3/uL (ref 4.0–10.5)
nRBC: 0 % (ref 0.0–0.2)

## 2022-05-07 LAB — CMP (CANCER CENTER ONLY)
ALT: 27 U/L (ref 0–44)
AST: 24 U/L (ref 15–41)
Albumin: 4.3 g/dL (ref 3.5–5.0)
Alkaline Phosphatase: 80 U/L (ref 38–126)
Anion gap: 7 (ref 5–15)
BUN: 18 mg/dL (ref 6–20)
CO2: 24 mmol/L (ref 22–32)
Calcium: 9.6 mg/dL (ref 8.9–10.3)
Chloride: 108 mmol/L (ref 98–111)
Creatinine: 0.9 mg/dL (ref 0.44–1.00)
GFR, Estimated: >60 mL/min (ref >60)
Glucose, Bld: 105 mg/dL — ABNORMAL HIGH (ref 70–99)
Potassium: 3.7 mmol/L (ref 3.5–5.1)
Sodium: 139 mmol/L (ref 135–145)
Total Bilirubin: 0.6 mg/dL (ref 0.3–1.2)
Total Protein: 8.3 g/dL — ABNORMAL HIGH (ref 6.5–8.1)

## 2022-05-07 LAB — LACTATE DEHYDROGENASE: LDH: 141 U/L (ref 98–192)

## 2022-05-07 LAB — IRON AND IRON BINDING CAPACITY (CC-WL,HP ONLY)
Iron: 29 ug/dL (ref 28–170)
Saturation Ratios: 6 % — ABNORMAL LOW (ref 10.4–31.8)
TIBC: 472 ug/dL — ABNORMAL HIGH (ref 250–450)
UIBC: 443 ug/dL

## 2022-05-07 LAB — FOLATE: Folate: 8.8 ng/mL (ref >5.9)

## 2022-05-07 LAB — TSH: TSH: 0.907 u[IU]/mL (ref 0.350–4.500)

## 2022-05-07 LAB — FERRITIN: Ferritin: 10 ng/mL — ABNORMAL LOW (ref 11–307)

## 2022-05-07 MED ORDER — DOXYCYCLINE HYCLATE 100 MG PO CAPS: 100.0000 mg | ORAL_CAPSULE | Freq: Two times a day (BID) | ORAL | 0 refills | Status: DC

## 2022-05-07 NOTE — Progress Notes (Signed)
41 y.o. [id number] Married Black or [place] Not Hispanic or Latino female here for anemia. She has very heavy periods. She states that she is having shortness of breath when she moves around. She is interested in a possible ablation. Cycles are every 20 days. Period Cycle (Days): 28 Period Duration (Days): 5 Period Pattern: Regular Menstrual Flow: Heavy (She states that she has clots) Menstrual Control: Maxi pad Menstrual Control Change Freq (Hours): 1 Dysmenorrhea: (!) Moderate Dysmenorrhea Symptoms: Cramping, Nausea, Diarrhea, Headache Cycles are very heavy for 3 days. She doesn't go out during those days, works from home. She is wearing depends at night. She gets light headed after her cycle.  She has intermittent deep dyspareunia, worse near her cycle, not positional.   HGB from 05/05/22 was 7.4.  05/02/22: TSH 0.892, negative GC/CT/Trich  She has a h/o HTN, not currently on medication. Not consisently high.   Patient's last menstrual period was 05/01/2022 (exact date).          Sexually active: Yes.    The current method of family planning is tubal ligation.    Exercising: No.  The patient does not participate in regular exercise at present. Smoker:  no H/O FTNSVD's, largest was 6 lb 5 oz.  Health Maintenance: Pap:  05/02/22 Neg, negative  Hr HPV  History of abnormal Pap:  yes HX of LEEP in 2010 and Cryo  MMG:  none  BMD:   none  Colonoscopy: none  TDaP:  unsure  Gardasil: none    reports that she has never smoked. She has never used smokeless tobacco. She reports current alcohol use. She reports that she does not use drugs. 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 Kids are 22, 15, 12 and 9.   Past Medical History:  Diagnosis Date   Abnormal Pap smear    Anemia    Anxiety    Back pain    BV (bacterial vaginosis) 2011   Chlamydia infection    CIN I (cervical intraepithelial neoplasia I) 2008   Depression    Dysmenorrhea    Dyspnea     Dysrhythmia 2009   TACHYCARDIA;   HAD W/U 2010? 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  B/P nl, and 122/76 on repeat-sitting   Postpartum depression 2011   NO MEDS   Pregnancy induced hypertension    ALL PREGNANCIES   Shortness of breath    WITH EXERTION SICNE PREGNANCY   Tachycardia    Tachycardia 2010   HAS HAD WORKUP. UNSURE IF HAS POSSIBLE HEART VALVE ISSUE   Vulvitis 2009    Past Surgical History:  Procedure Laterality Date   BREAST REDUCTION SURGERY  12/08/2006   BREAST SURGERY  CERVICAL BIOPSY  W/ LOOP ELECTRODE EXCISION  2010   DILATION AND CURETTAGE OF UTERUS     FINGER SURGERY     Right index finger   TUBAL LIGATION  01/09/2013   Procedure: POST PARTUM TUBAL LIGATION;  Surgeon: Michael Litter, MD;  Location: [facility];  Service: Gynecology;  Laterality: Bilateral;  post partum tubal ligation bilateral   US ECHOCARDIOGRAPHY  06/18/2009   ef 55-60%   WISDOM TOOTH EXTRACTION      Current Outpatient Medications  Medication Sig Dispense Refill   ferrous sulfate 325 (65 FE) MG tablet Take 1 tablet (325 mg total) by mouth 3 (three) times daily with meals. 90 tablet 3   No current facility-administered medications for this visit.    Family History  Problem Relation Age of Onset   Hypertension Mother    Hyperlipidemia Mother    Coronary artery disease Mother    Heart disease Mother        high  cholesterol   Asthma Mother    Diabetes Mother    Depression Mother    Anxiety disorder Mother    Sleep apnea Mother    Obesity Mother    Eating disorder Mother    Hypertension Father    Hypertension Maternal Grandmother    Hypertension Maternal Grandfather    Asthma Daughter    Alcohol abuse Maternal Uncle    Drug abuse Maternal Uncle     Review of Systems  Genitourinary:  Positive for menstrual problem.  All other systems reviewed and are negative.  Exam:   BP (!) 148/94   Pulse 74   Ht 5\' 2"  (1.575 m)   Wt 203 lb (92.1 kg)   LMP 05/01/2022 (Exact Date)   SpO2 100%   BMI 37.13 kg/m   Weight change: @WEIGHTCHANGE @ Height:   Height: 5\' 2"  (157.5 cm)  Ht Readings from Last 3 Encounters:  05/07/22 5\' 2"  (1.575 m)  05/02/22 5\' 2"  (1.575 m)  04/10/20 5\' 2"  (1.575 m)    General appearance: alert, cooperative and appears stated age Head: Normocephalic, without obvious abnormality, atraumatic Neck: no adenopathy, supple, symmetrical, trachea midline and thyroid normal to inspection and palpation Abdomen: soft, non-tender; non distended,  no masses,  no organomegaly Extremities: extremities normal, atraumatic, no cyanosis or edema Skin: Skin color, texture, turgor normal. No rashes or lesions Lymph nodes: Cervical, supraclavicular, and axillary nodes normal. No abnormal inguinal nodes palpated Neurologic: Grossly normal   Pelvic: External genitalia:  no lesions              Urethra:  normal appearing urethra with no masses, tenderness or lesions              Bartholins and Skenes: normal                 Vagina: normal appearing vagina with normal color and discharge, no lesions              Cervix: no cervical motion tenderness and no lesions               Bimanual Exam:  Uterus:   mobile, tender, mildly enlarged              Adnexa: mildly tender, no masses.   Pelvic floor: not tender.                  05/09/22 chaperoned for the exam.   1. Menorrhagia with  regular cycle Leading to anemia, not a  candidate for OCP's - US PELVIS TRANSVAGINAL NON-OB (TV ONLY); Future -Discussed options of micronor, daily aygestin, lysteda for up to 5 days a month, possible endometrial ablation and laparoscopic hysterectomy -Depending on ultrasound results she may need an endometrial biopsy  2. Iron deficiency anemia due to chronic blood loss Will arrange for Hematology referral and iron transfusion  3. Uterine tenderness Negative STD testing, will treat for possible endometritis.  - doxycycline (VIBRAMYCIN) 100 MG capsule; Take 1 capsule (100 mg total) by mouth 2 (two) times daily. Take BID for 10 days.  Take with food as can cause GI distress.  Dispense: 20 capsule; Refill: 0

## 2022-05-07 NOTE — Telephone Encounter (Signed)
Referral placed in epic at [facility] health hematology. They will call patient to schedule.

## 2022-05-07 NOTE — Telephone Encounter (Signed)
Scheduled appt per 5/31 referral. Pt is aware of appt date and time. Pt is aware to arrive 15 mins prior to appt time and to bring and updated insurance card. Pt is aware of appt location.   

## 2022-05-07 NOTE — Telephone Encounter (Signed)
-----   Message from Romualdo Bolk, MD sent at 05/07/2022  8:57 AM EDT ----- This patient has severe anemia, can you please see how quickly you can get her in to hematology for an iron transfusion. Thanks, Noreene Larsson

## 2022-05-07 NOTE — Progress Notes (Signed)
[place] [phone]   [phone]  CONSULT NOTE  REFERRING PHYSICIAN: Salvadore Dom, MD   REASON FOR CONSULTATION:  41 years old African-American female with persistent anemia  HPI Patricia Lin is a 40 y.o. female with past medical history significant for multiple medical problems including history of anxiety/depression, anemia, chronic back pain, dysmenorrhea, hypertension as well as [place] history of anemia that has been going on for at least 18 years secondary to menorrhagia.  The patient mentions that she has her menstrual period coming every 3 weeks and last for around 5 days.  It has been heavy with clots.  She was seen by her gynecologist earlier today and working on plan to regulate her menstrual periods.  She was found to have persistently low anemia with hemoglobin in the range of 7-8 g/dL.  The patient has been on ferrous sulfate 325 mg p.o. 3 times daily for [place] time with no improvement in her anemia.  She was referred to me today for evaluation and recommendation regarding her condition. When seen today she continues to complain of increasing fatigue and weakness as well as lightheadedness, headache and dizzy spells.  She also has shortness of breath with exertion.  She denied having any chest pain, cough or hemoptysis.  She has no recent weight loss or night sweats.  She has no fever or chills.  She has a lot of craving for ice.  The patient is traveling to [place] for her anniversary with her husband next week and she would like to have some improvement in her condition before the travel. Family history significant for mother with multiple medical problems including coronary artery disease, hypertension, diabetes as well as colon cancer diagnosed recently at age 65.  Father has hypertension. The patient is married and has 4 children.  She works as an Nurse, mental health.  She has no history of smoking and drinks alcohol occasionally and no  history of drug abuse.  HPI  Past Medical History:  Diagnosis Date   Abnormal Pap smear    Anemia    Anxiety    Back pain    BV (bacterial vaginosis) 2011   Chlamydia infection    CIN I (cervical intraepithelial neoplasia I) 2008   Depression    Dysmenorrhea    Dyspnea    Dysrhythmia 2009   TACHYCARDIA;   HAD W/U 2010? 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  B/P nl, and 122/76 on repeat-sitting   Postpartum depression 2011   NO MEDS   Pregnancy induced hypertension    ALL PREGNANCIES   Shortness of breath    WITH EXERTION SICNE PREGNANCY   Tachycardia    Tachycardia 2010   HAS HAD WORKUP. UNSURE  IF HAS POSSIBLE HEART VALVE ISSUE   Vulvitis 2009    Past Surgical History:  Procedure Laterality Date   BREAST REDUCTION SURGERY  12/08/2006   BREAST SURGERY     CERVICAL BIOPSY  W/ LOOP ELECTRODE EXCISION  2010   DILATION AND CURETTAGE OF UTERUS     FINGER SURGERY     Right index finger   TUBAL LIGATION  01/09/2013   Procedure: POST PARTUM TUBAL LIGATION;  Surgeon: Michael LitterNaima A Dillard, MD;  Location: [facility];  Service: Gynecology;  Laterality: Bilateral;  post partum tubal ligation bilateral   US ECHOCARDIOGRAPHY  06/18/2009   ef 55-60%   WISDOM TOOTH EXTRACTION      Family History  Problem Relation Age of Onset   Hypertension Mother    Hyperlipidemia Mother    Coronary artery disease Mother     Heart disease Mother        high cholesterol   Asthma Mother    Diabetes Mother    Depression Mother    Anxiety disorder Mother    Sleep apnea Mother    Obesity Mother    Eating disorder Mother    Hypertension Father    Hypertension Maternal Grandmother    Hypertension Maternal Grandfather    Asthma Daughter    Alcohol abuse Maternal Uncle    Drug abuse Maternal Uncle     Social History Social History   Tobacco Use   Smoking status: Never   Smokeless tobacco: Never  Vaping Use   Vaping Use: Never used  Substance Use Topics   Alcohol use: Yes    Comment: socially   Drug use: No    Allergies  Allergen Reactions   Latex Itching and Rash    Current Outpatient Medications  Medication Sig Dispense Refill   doxycycline (VIBRAMYCIN) 100 MG capsule Take 1 capsule (100 mg total) by mouth 2 (two) times daily. Take BID for 10 days.  Take with food as can cause GI distress. 20 capsule 0   ferrous sulfate 325 (65 FE) MG tablet Take 1 tablet (325 mg total) by mouth 3 (three) times daily with meals. 90 tablet 3   No current facility-administered medications for this visit.    Review of Systems  Constitutional: positive for fatigue Eyes: negative Ears, nose, mouth, throat, and face: negative Respiratory: positive for dyspnea on exertion Cardiovascular: negative Gastrointestinal: positive for constipation Genitourinary:negative Integument/breast: negative Hematologic/lymphatic: negative Musculoskeletal:negative Neurological: positive for dizziness and headaches Behavioral/Psych: negative Endocrine: negative Allergic/Immunologic: negative  Physical Exam  ZOX:WRUEARAL:alert, healthy, no distress, well nourished, and well developed SKIN: skin color, texture, turgor are normal, no rashes or significant lesions HEAD: Normocephalic, No masses, lesions, tenderness or abnormalities EYES: normal, PERRLA, Conjunctiva are pink and non-injected EARS: External ears normal, Canals  clear OROPHARYNX:no exudate, no erythema, and lips, buccal mucosa, and tongue normal  NECK: supple, no adenopathy, no JVD LYMPH:  no palpable lymphadenopathy, no hepatosplenomegaly BREAST:not examined LUNGS: clear to auscultation , and palpation HEART: regular rate & rhythm, no murmurs, and no gallops ABDOMEN:abdomen soft, non-tender, obese, normal bowel sounds, and no masses or organomegaly BACK: Back symmetric, no curvature., No CVA tenderness EXTREMITIES:no joint deformities, effusion, or inflammation, no edema  NEURO: alert & oriented x 3 with fluent speech, no focal motor/sensory deficits  PERFORMANCE STATUS: ECOG 1  LABORATORY DATA: Lab Results  Component Value Date   WBC 6.0 05/07/2022   HGB 7.8 (L) 05/07/2022   HCT 27.2 (L) 05/07/2022   MCV 65.2 (L) 05/07/2022  PLT 361 05/07/2022      Chemistry      Component Value Date/Time   NA 139 05/05/2022 1330   NA 140 05/02/2022 1000   K 3.9 05/05/2022 1330   CL 111 05/05/2022 1330   CO2 24 05/05/2022 1330   BUN 12 05/05/2022 1330   BUN 14 05/02/2022 1000   CREATININE 0.66 05/05/2022 1330   CREATININE 0.64 01/17/2013 1720      Component Value Date/Time   CALCIUM 9.2 05/05/2022 1330   ALKPHOS 74 05/05/2022 1330   AST 29 05/05/2022 1330   ALT 29 05/05/2022 1330   BILITOT 0.4 05/05/2022 1330   BILITOT 0.6 05/02/2022 1000       RADIOGRAPHIC STUDIES: No results found.  ASSESSMENT: This is a very pleasant 41 years old African-American female with [place] history of iron deficiency anemia secondary to menorrhagia.  The patient tried oral iron tablets with ferrous sulfate 325 mg p.o. 3 times daily with no improvement in her condition.   PLAN: I had a lengthy discussion with the patient today about her current condition and treatment options. I ordered several studies for evaluation of her anemia and repeat CBC today showed persistent anemia with hemoglobin of 7.8 and hematocrit 27.2% with MCV of 65.2. Iron study showed low  normal serum iron of 29 with iron saturation of 6% and TIBC of 472.  Ferritin level is still pending, I will order also vitamin B12, serum folate, TSH, serum protein electrophoresis with immunofixation as well as LDH and the studies are still pending. I recommended for the patient to proceed with iron infusion with Venofer 500 Mg IV weekly for 2 weeks.  We will try to give her the first dose of her treatment before traveling to [place] next week for her anniversary vacation.  She will do the second dose after her return. I will see the patient back for follow-up visit in around 2 months for evaluation and repeat CBC, iron study and ferritin. The patient was advised to call immediately if she has any other concerning symptoms in the interval. The patient voices understanding of current disease status and treatment options and is in agreement with the current care plan.  All questions were answered. The patient knows to call the clinic with any problems, questions or concerns. We can certainly see the patient much sooner if necessary.  Thank you so much for allowing me to participate in the care of [place]. I will continue to follow up the patient with you and assist in her care.  The total time spent in the appointment was 60 minutes.  Disclaimer: This note was dictated with voice recognition software. Similar sounding words can inadvertently be transcribed and may not be corrected upon review.   Eilleen Kempf May 07, 2022, 2:34 PM

## 2022-05-07 NOTE — Patient Instructions (Addendum)
Options for treatment: 1) mini-pill (micronor) 2) daily progesterone (ie aygestin), can increase from 1 to up to 3 x a day if bleeding is heavy 3) Lysteda, take this for up to 5 days a month to decrease your blood flow, you only take this with your cycle 4) Depending on your ultrasound, you may be a candidate for an endometrial ablation 5) laparoscopic hysterectomy Depending on the results of your ultrasound, you may need an endometrial biopsy After your ultrasound we should meet up again to discuss further plans.  We are working on getting you in to see Hematology for an iron transfusion

## 2022-05-07 NOTE — Telephone Encounter (Signed)
Patient scheduled on 05/07/22 at [facility].

## 2022-05-08 LAB — PROTEIN ELECTROPHORESIS, SERUM, WITH REFLEX
A/G Ratio: 1.2 (ref 0.7–1.7)
Albumin ELP: 4.1 g/dL (ref 2.9–4.4)
Alpha-1-Globulin: 0.2 g/dL (ref 0.0–0.4)
Alpha-2-Globulin: 0.7 g/dL (ref 0.4–1.0)
Beta Globulin: 1.2 g/dL (ref 0.7–1.3)
Gamma Globulin: 1.5 g/dL (ref 0.4–1.8)
Globulin, Total: 3.5 g/dL (ref 2.2–3.9)
Total Protein ELP: 7.6 g/dL (ref 6.0–8.5)

## 2022-05-09 NOTE — Telephone Encounter (Signed)
Call patient with update.   Update patient on office policy regarding completion of paperwork.

## 2022-05-12 ENCOUNTER — Other Ambulatory Visit: Payer: Self-pay | Admitting: Pharmacy Technician

## 2022-05-12 ENCOUNTER — Telehealth: Payer: Self-pay | Admitting: Pharmacy Technician

## 2022-05-12 NOTE — Telephone Encounter (Signed)
Auth Submission: no auth needed Payer: [employer] Medication & CPT/J Code(s) submitted: Venofer (Iron Sucrose) J1756 Route of submission (phone, fax, portal): phone Auth type: Buy/Bill Units/visits requested: Reference number: [id number] Approval from: 05/12/22 to 08/13/22

## 2022-05-13 ENCOUNTER — Encounter: Payer: Self-pay | Admitting: Internal Medicine

## 2022-05-13 ENCOUNTER — Other Ambulatory Visit: Payer: Self-pay | Admitting: Family

## 2022-05-13 ENCOUNTER — Ambulatory Visit (INDEPENDENT_AMBULATORY_CARE_PROVIDER_SITE_OTHER): Payer: No Typology Code available for payment source

## 2022-05-13 VITALS — BP 122/79 | HR 73 | Temp 98.7°F | Resp 20 | Ht 62.0 in | Wt 203.4 lb

## 2022-05-13 DIAGNOSIS — Z1231 Encounter for screening mammogram for malignant neoplasm of breast: Secondary | ICD-10-CM

## 2022-05-13 DIAGNOSIS — D500 Iron deficiency anemia secondary to blood loss (chronic): Secondary | ICD-10-CM | POA: Diagnosis not present

## 2022-05-13 MED ORDER — NORETHINDRONE ACETATE 5 MG PO TABS: ORAL_TABLET | ORAL | 1 refills | Status: DC

## 2022-05-13 MED ORDER — SODIUM CHLORIDE 0.9 % IV SOLN
500.0000 mg | Freq: Once | INTRAVENOUS | Status: AC
  Administered 2022-05-13: 500 mg via INTRAVENOUS
  Filled 2022-05-13: qty 25

## 2022-05-13 NOTE — Addendum Note (Signed)
Addended by: Tobi Bastos on: 05/13/2022 09:29 AM   Modules accepted: Orders

## 2022-05-13 NOTE — Progress Notes (Signed)
Diagnosis: Iron Deficiency Anemia  Provider:  Marshell Garfinkel, MD  Procedure: Infusion  IV Type: Peripheral, IV Location: L Antecubital  Venofer (Iron Sucrose), Dose: 500 mg  Infusion Start Time: 0903  Infusion Stop Time: [id number]  Post Infusion IV Care: Patient declined observation and Peripheral IV Discontinued  Discharge: Condition: Good, Destination: Home . AVS provided to patient.   Performed by:  Cleophus Molt, RN

## 2022-05-13 NOTE — Progress Notes (Deleted)
Diagnosis: Iron Deficiency Anemia  Provider:  Chilton Greathouse, MD  Procedure: Infusion  IV Type: Peripheral, IV Location: L Antecubital  Venofer (Iron Sucrose), Dose: 500 mg  Infusion Start Time: 0903  Infusion Stop Time: 1335 pm  Post Infusion IV Care: Observation period completed and Peripheral IV Discontinued  Discharge: Condition: Good, Destination: Home . AVS provided to patient.   Performed by:  Adriana Mccallum, RN

## 2022-05-23 ENCOUNTER — Telehealth: Payer: Self-pay | Admitting: Internal Medicine

## 2022-05-23 ENCOUNTER — Ambulatory Visit (INDEPENDENT_AMBULATORY_CARE_PROVIDER_SITE_OTHER): Payer: No Typology Code available for payment source

## 2022-05-23 VITALS — BP 154/84 | HR 69 | Temp 98.2°F | Resp 16 | Ht 62.0 in | Wt 204.0 lb

## 2022-05-23 DIAGNOSIS — D500 Iron deficiency anemia secondary to blood loss (chronic): Secondary | ICD-10-CM | POA: Diagnosis not present

## 2022-05-23 MED ORDER — SODIUM CHLORIDE 0.9 % IV SOLN
500.0000 mg | Freq: Once | INTRAVENOUS | Status: AC
  Administered 2022-05-23: 500 mg via INTRAVENOUS
  Filled 2022-05-23: qty 25

## 2022-05-23 NOTE — Progress Notes (Signed)
Diagnosis: Iron Deficiency Anemia  Provider:  Chilton Greathouse, MD  Procedure: Infusion  IV Type: Peripheral, IV Location: R Antecubital  Venofer (Iron Sucrose), Dose: 500 mg  Infusion Start Time: 1130  Infusion Stop Time: 1538  Post Infusion IV Care: Peripheral IV Discontinued  Discharge: Condition: Good, Destination: Home . AVS provided to patient.   Performed by:  Adriana Mccallum, RN

## 2022-05-23 NOTE — Telephone Encounter (Signed)
Called patient regarding upcoming July appointments, patient is notified. 

## 2022-05-27 ENCOUNTER — Ambulatory Visit (INDEPENDENT_AMBULATORY_CARE_PROVIDER_SITE_OTHER): Payer: No Typology Code available for payment source

## 2022-05-27 DIAGNOSIS — N920 Excessive and frequent menstruation with regular cycle: Secondary | ICD-10-CM

## 2022-05-28 ENCOUNTER — Encounter: Payer: Self-pay | Admitting: Obstetrics and Gynecology

## 2022-05-28 ENCOUNTER — Ambulatory Visit: Payer: No Typology Code available for payment source | Admitting: Obstetrics and Gynecology

## 2022-05-28 VITALS — BP 110/72 | HR 82 | Ht 60.75 in | Wt 205.0 lb

## 2022-05-28 DIAGNOSIS — N920 Excessive and frequent menstruation with regular cycle: Secondary | ICD-10-CM | POA: Diagnosis not present

## 2022-05-28 DIAGNOSIS — D500 Iron deficiency anemia secondary to blood loss (chronic): Secondary | ICD-10-CM

## 2022-05-28 MED ORDER — TRANEXAMIC ACID 650 MG PO TABS: 1300.0000 mg | ORAL_TABLET | Freq: Three times a day (TID) | ORAL | 2 refills | Status: DC

## 2022-05-28 NOTE — Progress Notes (Signed)
GYNECOLOGY  VISIT   HPI: 41 y.o.   Married Black or [place] American Not Hispanic or Latino  female   [phone] with Patient's last menstrual period was 05/22/2022 (exact date).   here for f/u of menorrhagia leading to anemia. Cycles are q 20 days x 5 days with heavy flow for 3 days. She is saturating a pad an hour and using depends. Moderate cramps. Intermittent deep dyspareunia which is worse near her cycle and not positional.   She was seen here at the end of May and started on daily aygestin to try and prevent her cycle and doxycycline for uterine tenderness. She didn't like how she felt on the aygestin, felt dizzy, tired, felt like her BP was higher on it so she stopped it. Cycle started on 05/22/22 and was very heavy.   HGB from 05/05/22 was 7.4. Ferritin from 05/07/22 was 10.  She was referred to Hematology for iron transfusions.   She has had 2 iron transfusions since her last visit and has a f/u visit scheduled with Hematology   05/02/22: TSH 0.892, negative GC/CT/Trich  She has a h/o HTN, not currently on medication. Not consisently high.   GYNECOLOGIC HISTORY: Patient's last menstrual period was 05/22/2022 (exact date). Contraception:tubal ligation  Menopausal hormone therapy: NA        OB History     Gravida  7   Para  4   Term  4   Preterm      AB  3   Living  4      SAB  1   IAB  2   Ectopic      Multiple      Live Births  4        Obstetric Comments  2011 PRECLAMPSIA PP; IN ICU X 3 DAYS            Patient Active Problem List   Diagnosis Date Noted   Iron deficiency anemia due to chronic blood loss 05/07/2022   Right lower quadrant abdominal pain 01/28/2019   Generalized headaches 12/17/2018   Anxiety 12/17/2018   History of anemia 05/26/2018   Anemia 12/05/2017   Routine health maintenance 03/03/2013   S/P tubal ligation 02/09/2013   Vaginal delivery 01/09/2013   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   Fracture of left ulna 09/27/12 10/23/2012    Cervical funneling 09/18/2012   Cervical shortening 09/18/2012   Symptomatic anemia 09/03/2012   H/O pre-eclampsia in prior pregnancy, currently pregnant 08/19/2012   Chronic hypertension in pregnancy 08/19/2012   Hx LEEP (loop electrosurgical excision procedure), cervix, pregnancy 08/19/2012   Hx of postpartum depression, currently pregnant 08/19/2012   Late prenatal care 08/19/2012   Hx of precipitous labor and deliveries, antepartum 08/19/2012   Muscle tension HAs 08/19/2012    Past Medical History:  Diagnosis Date   Abnormal Pap smear    Anemia    Anxiety    Back pain    BV (bacterial vaginosis) 2011   Chlamydia infection    CIN I (cervical intraepithelial neoplasia I) 2008   Depression    Dysmenorrhea    Dyspnea    Dysrhythmia 2009   TACHYCARDIA;   HAD W/U 2010? POSSIBLE HEART VALVE ISSUE; NOT F/U NEEDED X 3 YEARS PER PT   Dysuria 2010   Fatigue    H/O pre-eclampsia in prior pregnancy, currently pregnant    H/O varicella    H/O vitamin D deficiency    Headache(784.0)    MIGRAINES   HTN (hypertension)  Hx of breast reduction, elective    Hx: UTI (urinary tract infection)    Infection    UTI DURING PREGNANCY   Infection    YEAST WITH PREGNANCY   Infection    CHLAMYDIA X 1   Knee pain    LGSIL (low grade squamous intraepithelial dysplasia) 04/04/2008   CRYO; LEEP; LAST PAP 05/2011   Low iron    PIH (pregnancy induced hypertension) 11/2012   Today pp one month.  B/P nl, and 122/76 on repeat-sitting   Postpartum depression 2011   NO MEDS   Pregnancy induced hypertension    ALL PREGNANCIES   Shortness of breath    WITH EXERTION SICNE PREGNANCY   Tachycardia    Tachycardia 2010   HAS HAD WORKUP. UNSURE IF HAS POSSIBLE HEART VALVE ISSUE   Vulvitis 2009    Past Surgical History:  Procedure Laterality Date   BREAST REDUCTION SURGERY  12/08/2006   BREAST SURGERY     CERVICAL BIOPSY  W/ LOOP ELECTRODE EXCISION  2010   DILATION AND CURETTAGE OF UTERUS      FINGER SURGERY     Right index finger   TUBAL LIGATION  01/09/2013   Procedure: POST PARTUM TUBAL LIGATION;  Surgeon: Michael Litter, MD;  Location: [facility];  Service: Gynecology;  Laterality: Bilateral;  post partum tubal ligation bilateral   US ECHOCARDIOGRAPHY  06/18/2009   ef 55-60%   WISDOM TOOTH EXTRACTION      Current Outpatient Medications  Medication Sig Dispense Refill   tranexamic acid (LYSTEDA) 650 MG TABS tablet Take 2 tablets (1,300 mg total) by mouth 3 (three) times daily. 30 tablet 2   ferrous sulfate 325 (65 FE) MG tablet Take 1 tablet (325 mg total) by mouth 3 (three) times daily with meals. 90 tablet 3   No current facility-administered medications for this visit.     ALLERGIES: Latex  Family History  Problem Relation Age of Onset   Hypertension Mother    Hyperlipidemia Mother    Coronary artery disease Mother    Heart disease Mother        high cholesterol   Asthma Mother    Diabetes Mother    Depression Mother    Anxiety disorder Mother    Sleep apnea Mother    Obesity Mother    Eating disorder Mother    Hypertension Father    Hypertension Maternal Grandmother    Hypertension Maternal Grandfather    Asthma Daughter    Alcohol abuse Maternal Uncle    Drug abuse Maternal Uncle     Social History   Socioeconomic History   Marital status: Married    Spouse name: Jerl Santos   Number of children: 3   Years of education: 13   Highest education level: Not on file  Occupational History   Occupation: CUSTOMER SERVICE     Employer: [employer]  Tobacco Use   Smoking status: Never   Smokeless tobacco: Never  Vaping Use   Vaping Use: Never used  Substance and Sexual Activity   Alcohol use: Yes    Comment: socially   Drug use: No   Sexual activity: Yes    Partners: Male    Birth control/protection: Surgical  Other Topics Concern   Not on file  Social History Narrative   Not on file   Social Determinants of Health   Financial Resource  Strain: Not on file  Food Insecurity: Not on file  Transportation Needs: Not on file  Physical Activity: Not on file  Stress: Not on file  Social Connections: Not on file  Intimate Partner Violence: Not on file    ROS  PHYSICAL EXAMINATION:    BP 110/72   Pulse 82   Ht 5' 0.75" (1.543 m)   Wt 205 lb (93 kg)   LMP 05/22/2022 (Exact Date)   SpO2 100%   BMI 39.05 kg/m     General appearance: alert, cooperative and appears stated age  27. Menorrhagia with regular cycle Recent ultrasound with signs of adenomyosis, otherwise normal -We discussed options for treatment. She isn't a candidate for OCP's, didn't like daily aygestin. Discussed options of the mini-pill, lysteda, the mirena IUD. -Also discussed surgical options, discussed possibly higher failure rate of ablation with adenomyosis and the size of her uterus, discussed TLH. -She would like to try lysteda - tranexamic acid (LYSTEDA) 650 MG TABS tablet; Take 2 tablets (1,300 mg total) by mouth 3 (three) times daily.  Dispense: 30 tablet; Refill: 2. Aware to only use for up to 5 days a month - Endometrial biopsy; Future. She will premedicate with ibuprofen  2. Iron deficiency anemia due to chronic blood loss F/U with Hematology, s/p iron transfusion x 2

## 2022-05-28 NOTE — Patient Instructions (Signed)
Your ultrasound images were consistent with Adenomyosis

## 2022-05-30 ENCOUNTER — Encounter: Payer: Self-pay | Admitting: Internal Medicine

## 2022-05-30 ENCOUNTER — Ambulatory Visit: Payer: No Typology Code available for payment source | Admitting: Internal Medicine

## 2022-05-30 ENCOUNTER — Ambulatory Visit
Admission: RE | Admit: 2022-05-30 | Discharge: 2022-05-30 | Disposition: A | Discharge status: Home / Self Care | Payer: No Typology Code available for payment source | Source: Ambulatory Visit

## 2022-05-30 VITALS — BP 138/88 | HR 74 | Ht 60.75 in | Wt 206.0 lb

## 2022-05-30 DIAGNOSIS — R011 Cardiac murmur, unspecified: Secondary | ICD-10-CM | POA: Diagnosis not present

## 2022-05-30 DIAGNOSIS — Z1231 Encounter for screening mammogram for malignant neoplasm of breast: Secondary | ICD-10-CM

## 2022-05-30 MED ORDER — AMLODIPINE BESYLATE 2.5 MG PO TABS: 2.5000 mg | ORAL_TABLET | Freq: Every day | ORAL | 3 refills | Status: DC

## 2022-06-03 ENCOUNTER — Other Ambulatory Visit: Payer: No Typology Code available for payment source

## 2022-06-11 ENCOUNTER — Encounter (INDEPENDENT_AMBULATORY_CARE_PROVIDER_SITE_OTHER): Payer: Self-pay | Admitting: Family Medicine

## 2022-06-11 ENCOUNTER — Ambulatory Visit (INDEPENDENT_AMBULATORY_CARE_PROVIDER_SITE_OTHER): Payer: No Typology Code available for payment source | Admitting: Family Medicine

## 2022-06-11 VITALS — BP 134/87 | HR 70 | Temp 98.3°F | Ht 62.0 in | Wt 203.0 lb

## 2022-06-11 DIAGNOSIS — E7849 Other hyperlipidemia: Secondary | ICD-10-CM | POA: Diagnosis not present

## 2022-06-11 DIAGNOSIS — F5089 Other specified eating disorder: Secondary | ICD-10-CM | POA: Diagnosis not present

## 2022-06-11 DIAGNOSIS — I10 Essential (primary) hypertension: Secondary | ICD-10-CM | POA: Diagnosis not present

## 2022-06-11 DIAGNOSIS — R5383 Other fatigue: Secondary | ICD-10-CM

## 2022-06-11 DIAGNOSIS — R0602 Shortness of breath: Secondary | ICD-10-CM | POA: Diagnosis not present

## 2022-06-11 DIAGNOSIS — Z9189 Other specified personal risk factors, not elsewhere classified: Secondary | ICD-10-CM

## 2022-06-11 DIAGNOSIS — Z1331 Encounter for screening for depression: Secondary | ICD-10-CM

## 2022-06-11 DIAGNOSIS — Z862 Personal history of diseases of the blood and blood-forming organs and certain disorders involving the immune mechanism: Secondary | ICD-10-CM

## 2022-06-11 DIAGNOSIS — Z6837 Body mass index (BMI) 37.0-37.9, adult: Secondary | ICD-10-CM

## 2022-06-11 DIAGNOSIS — E66812 Obesity, class 2: Secondary | ICD-10-CM
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
## 2022-06-12 LAB — INSULIN, RANDOM: INSULIN: 15.1 u[IU]/mL (ref 2.6–24.9)

## 2022-06-12 LAB — VITAMIN D 25 HYDROXY (VIT D DEFICIENCY, FRACTURES): Vit D, 25-Hydroxy: 22.2 ng/mL — ABNORMAL LOW (ref 30.0–100.0)

## 2022-06-13 NOTE — Progress Notes (Unsigned)
Chief Complaint:   OBESITY Patricia SHAMBLEY (MR# [id number]) is a 41 y.o. female who presents for evaluation and treatment of obesity and related comorbidities. Current BMI is Body mass index is 37.13 kg/m. Patricia Lin has been struggling with her weight for many years and has been unsuccessful in either losing weight, maintaining weight loss, or reaching her healthy weight goal.  Patricia Lin is currently in the action stage of change and ready to dedicate time achieving and maintaining a healthier weight. Patricia Lin is interested in becoming our patient and working on intensive lifestyle modifications including (but not limited to) diet and exercise for weight loss.  Patricia Lin was a pt here in 09/2018 and saw Dr. Dalbert Lin and PA Patricia Lin. She has 3 kids and husband, Patricia Lin at home. Pt loves her cake/cupcakes.  Patricia Lin's habits were reviewed today and are as follows: Her family eats meals together, she struggles with family and or coworkers weight loss sabotage, her desired weight loss is 53 lbs, she started gaining weight with her first pregnancy, her heaviest weight ever was 205 pounds, she is a picky eater and doesn't like to eat healthier foods, she has significant food cravings issues, she snacks frequently in the evenings, she wakes up frequently in the middle of the night to eat, she skips meals frequently, she is frequently drinking liquids with calories, she frequently makes poor food choices, she has problems with excessive hunger, she frequently eats larger portions than normal, she has binge eating behaviors, and she struggles with emotional eating.  Depression Screen Patricia Lin's Food and Mood (modified PHQ-9) score was 18.     06/11/2022    6:29 AM  Depression screen PHQ 2/9  Decreased Interest 3  Down, Depressed, Hopeless 3  PHQ - 2 Score 6  Altered sleeping 3  Tired, decreased energy 3  Change in appetite 3  Feeling bad or failure about yourself  0  Trouble concentrating 3  Moving slowly or  fidgety/restless 0  Suicidal thoughts 0  PHQ-9 Score 18  Difficult doing work/chores Very difficult   Subjective:   1. Other fatigue Patricia Lin admits to daytime somnolence and admits to waking up still tired. Patient has a history of symptoms of daytime fatigue, morning fatigue, and morning headache. Patricia Lin generally gets 5 or 6 hours of sleep per night, and states that she has poor sleep quality. Snoring is present. Apneic episodes are present. Epworth Sleepiness Score is 8.   2. SOB (shortness of breath) on exertion Patricia Lin notes increasing shortness of breath with exercising and seems to be worsening over time with weight gain. She notes getting out of breath sooner with activity than she used to. This has gotten worse recently. Patricia Lin denies shortness of breath at rest or orthopnea.  3. Essential hypertension Cardiology recently prescribed pt Norvasc, but she has  not started taking ir yet. She has a follow up appt with ECG and Echo on 14 July with Dr. Dietrich Pates.  4. Other hyperlipidemia Patricia Lin has hyperlipidemia and has been trying to improve her cholesterol levels with intensive lifestyle modifications including a low saturated fat diet, exercise, and weight loss. She denies any chest pain, claudication, or myalgias. Medication: None  5. History of anemia Pt with history of iron deficiency. Per pt, cause is unknown. She is taking an iron supplement daily.  6. Other specified eating disorder, with emotional eating PHQ is 18, but pt declines depression today.  7. At risk for heart disease Patricia Lin is at higher than  average risk for cardiovascular disease due to obesity, hyperlipidemia, and hypertension.  Assessment/Plan:   Orders Placed This Encounter  Procedures   VITAMIN D 25 Hydroxy (Vit-D Deficiency, Fractures)   Insulin, random    There are no discontinued medications.   No orders of the defined types were placed in this encounter.    1. Other fatigue Patricia Lin does feel  that her weight is causing her energy to be lower than it should be. Fatigue may be related to obesity, depression or many other causes. Labs will be ordered, and in the meanwhile, Patricia Lin will focus on self care including making healthy food choices, increasing physical activity and focusing on stress reduction. Obtain vitamin D level today.  - VITAMIN D 25 Hydroxy (Vit-D Deficiency, Fractures)  2. SOB (shortness of breath) on exertion Patricia Lin does feel that she gets out of breath more easily that she used to when she exercises. Patricia Lin's shortness of breath appears to be obesity related and exercise induced. She has agreed to work on weight loss and gradually increase exercise to treat her exercise induced shortness of breath. Will continue to monitor closely.  3. Essential hypertension Not at goal today. Patricia Lin is working on healthy weight loss and exercise to improve blood pressure control. We will watch for signs of hypotension as she continues her lifestyle modifications. Pt advised to follow the recommendations of cardiology. Check insulin level today.  - Insulin, random  4. Other hyperlipidemia Cardiovascular risk and specific lipid/LDL goals reviewed.  We discussed several lifestyle modifications today and Patricia Lin will continue to work on diet, exercise and weight loss efforts. Orders and follow up as documented in patient record. Prudent nutritional plan and lifestyle changes recommended.  Counseling Intensive lifestyle modifications are the first line treatment for this issue. Dietary changes: Increase soluble fiber. Decrease simple carbohydrates. Exercise changes: Moderate to vigorous-intensity aerobic activity 150 minutes per week if tolerated. Lipid-lowering medications: see documented in medical record.  5. History of anemia Orders and follow up as documented in patient record. Continue daily iron supplement.  Counseling Iron is essential for our bodies to make red blood cells.   Reasons that someone may be deficient include: an iron-deficient diet (more likely in those following vegan or vegetarian diets), women with heavy menses, patients with GI disorders or poor absorption, patients that have had bariatric surgery, frequent blood donors, patients with cancer, and patients with heart disease.   Iron-rich foods include dark leafy greens, red and white meats, eggs, seafood, and beans.   Certain foods and drinks prevent your body from absorbing iron properly. Avoid eating these foods in the same meal as iron-rich foods or with iron supplements. These foods include: coffee, black tea, and red wine; milk, dairy products, and foods that are high in calcium; beans and soybeans; whole grains.  Constipation can be a side effect of iron supplementation. Increased water and fiber intake are helpful. Water goal: > 2 liters/day. Fiber goal: > 25 grams/day.  6. Other specified eating disorder, with emotional eating Behavior modification techniques were discussed today to help Patricia Lin deal with her emotional/non-hunger eating behaviors.  Orders and follow up as documented in patient record. Referral to Dr. Dewaine Conger. Intention eating discussed with pt.  7. Depression screening Patricia Lin had a positive depression screening. Depression is commonly associated with obesity and often results in emotional eating behaviors. We will monitor this closely and work on CBT to help improve the non-hunger eating patterns. Referral to Psychology may be required if no improvement is  seen as she continues in our clinic.  8. At risk for heart disease Due to [employer] current state of health and medical condition(s), she is at a higher risk for heart disease.  This puts the patient at much greater risk to subsequently develop cardiopulmonary conditions that can significantly affect patient's quality of life in a negative manner as well.    At least 30 minutes were spent on counseling Klyn N Lin about these  concerns today, and we discussed the importance of reversing risks factors of obesity, especially truncal and visceral fat, hypertension, hyperlipidemia, and pre-diabetes.  The initial goal is to lose at least 5-10% of starting weight to help reduce these risk factors.  Counseling: Intensive lifestyle modifications were discussed with Patricia Lin as the most appropriate first line of treatment.  she will continue to work on diet, exercise, and weight loss efforts.  We will continue to reassess these conditions on a fairly regular basis in an attempt to decrease the patient's overall morbidity and mortality.  Evidence-based interventions for health behavior change were utilized today including the discussion of self monitoring techniques, problem-solving barriers, and SMART goal setting techniques.  Specifically, regarding patient's less desirable eating habits and patterns, we employed the technique of small changes when Patricia Lin has not been able to fully commit to her prudent nutritional plan.  9. Class 2 severe obesity with serious comorbidity and body mass index (BMI) of 37.0 to 37.9 in adult, unspecified obesity type (HCC) Patricia Lin is currently in the action stage of change and her goal is to continue with weight loss efforts. I recommend Patricia Lin begin the structured treatment plan as follows:  She has agreed to the Category 1 Plan.  Exercise goals:  As is    Behavioral modification strategies: planning for success.  She was informed of the importance of frequent follow-up visits to maximize her success with intensive lifestyle modifications for her multiple health conditions. She was informed we would discuss her lab results at her next visit unless there is a critical issue that needs to be addressed sooner. Patricia Lin agreed to keep her next visit at the agreed upon time to discuss these results.  Objective:   Blood pressure 134/87, pulse 70, temperature 98.3 F (36.8 C), height 5\' 2"   (1.575 m), weight 203 lb (92.1 kg), last menstrual period 05/22/2022, SpO2 100 %. Body mass index is 37.13 kg/m.  EKG: Not performed, as pt will have it done with cardiology on 06/20/2022.  Indirect Calorimeter completed today shows a VO2 of 217 and a REE of 1498.  Her calculated basal metabolic rate is 06/22/2022 thus her basal metabolic rate is worse than expected.  General: Cooperative, alert, well developed, in no acute distress. HEENT: Conjunctivae and lids unremarkable. Cardiovascular: Regular rhythm.  Lungs: Normal work of breathing. Neurologic: No focal deficits.   Lab Results  Component Value Date   CREATININE 0.90 05/07/2022   BUN 18 05/07/2022   NA 139 05/07/2022   K 3.7 05/07/2022   CL 108 05/07/2022   CO2 24 05/07/2022   Lab Results  Component Value Date   ALT 27 05/07/2022   AST 24 05/07/2022   ALKPHOS 80 05/07/2022   BILITOT 0.6 05/07/2022   Lab Results  Component Value Date   HGBA1C 5.5 05/02/2022   HGBA1C 5.2 09/08/2018   Lab Results  Component Value Date   INSULIN 15.1 06/11/2022   INSULIN 11.5 09/08/2018   Lab Results  Component Value Date  TSH 0.907 05/07/2022   Lab Results  Component Value Date   CHOL 184 05/02/2022   HDL 68 05/02/2022   LDLCALC 106 (H) 05/02/2022   TRIG 53 05/02/2022   CHOLHDL 2.7 05/02/2022   Lab Results  Component Value Date   WBC 6.0 05/07/2022   HGB 7.8 (L) 05/07/2022   HCT 27.2 (L) 05/07/2022   MCV 65.2 (L) 05/07/2022   PLT 361 05/07/2022   Lab Results  Component Value Date   IRON 29 05/07/2022   TIBC 472 (H) 05/07/2022   FERRITIN 10 (L) 05/07/2022    Attestation Statements:   Reviewed by clinician on day of visit: allergies, medications, problem list, medical history, surgical history, family history, social history, and previous encounter notes.  I, Patricia Lin, BS, CMA, am acting as transcriptionist for [employer], DO.   I have reviewed the above documentation for accuracy and completeness, and  I agree with the above. Patricia Lin, D.O.  The 21st Century Cures Act was signed into law in 2016 which includes the topic of electronic health records.  This provides immediate access to information in MyChart.  This includes consultation notes, operative notes, office notes, lab results and pathology reports.  If you have any questions about what you read please let us know at your next visit so we can discuss your concerns and take corrective action if need be.  We are right here with you.

## 2022-06-16 ENCOUNTER — Telehealth (INDEPENDENT_AMBULATORY_CARE_PROVIDER_SITE_OTHER): Payer: No Typology Code available for payment source | Admitting: Psychology

## 2022-06-16 DIAGNOSIS — F331 Major depressive disorder, recurrent, moderate: Secondary | ICD-10-CM

## 2022-06-16 DIAGNOSIS — F5081 Binge eating disorder: Secondary | ICD-10-CM

## 2022-06-20 ENCOUNTER — Ambulatory Visit (HOSPITAL_COMMUNITY): Payer: No Typology Code available for payment source | Attending: Cardiology

## 2022-06-20 DIAGNOSIS — R011 Cardiac murmur, unspecified: Secondary | ICD-10-CM | POA: Diagnosis present

## 2022-06-20 LAB — ECHOCARDIOGRAM COMPLETE
Area-P 1/2: 3.17 cm2
S' Lateral: 2.7 cm

## 2022-06-25 ENCOUNTER — Ambulatory Visit (INDEPENDENT_AMBULATORY_CARE_PROVIDER_SITE_OTHER): Payer: No Typology Code available for payment source | Admitting: Family Medicine

## 2022-06-25 ENCOUNTER — Encounter (INDEPENDENT_AMBULATORY_CARE_PROVIDER_SITE_OTHER): Payer: Self-pay | Admitting: Family Medicine

## 2022-06-25 VITALS — BP 130/82 | HR 80 | Temp 98.0°F | Ht 62.0 in | Wt 205.0 lb

## 2022-06-25 DIAGNOSIS — E611 Iron deficiency: Secondary | ICD-10-CM | POA: Diagnosis not present

## 2022-06-25 DIAGNOSIS — E8881 Metabolic syndrome: Secondary | ICD-10-CM | POA: Diagnosis not present

## 2022-06-25 DIAGNOSIS — E559 Vitamin D deficiency, unspecified: Secondary | ICD-10-CM | POA: Diagnosis not present

## 2022-06-25 DIAGNOSIS — E7849 Other hyperlipidemia: Secondary | ICD-10-CM

## 2022-06-25 DIAGNOSIS — I10 Essential (primary) hypertension: Secondary | ICD-10-CM | POA: Diagnosis not present

## 2022-06-25 DIAGNOSIS — E669 Obesity, unspecified: Secondary | ICD-10-CM

## 2022-06-25 DIAGNOSIS — Z9189 Other specified personal risk factors, not elsewhere classified: Secondary | ICD-10-CM

## 2022-06-25 DIAGNOSIS — Z6837 Body mass index (BMI) 37.0-37.9, adult: Secondary | ICD-10-CM

## 2022-06-25 MED ORDER — VITAMIN D (ERGOCALCIFEROL) 1.25 MG (50000 UNIT) PO CAPS: 50000.0000 [IU] | ORAL_CAPSULE | ORAL | 0 refills | Status: DC

## 2022-06-25 MED ORDER — METFORMIN HCL 500 MG PO TABS: ORAL_TABLET | ORAL | 0 refills | Status: DC

## 2022-06-28 DIAGNOSIS — Z9189 Other specified personal risk factors, not elsewhere classified: Secondary | ICD-10-CM | POA: Insufficient documentation

## 2022-06-28 DIAGNOSIS — I10 Essential (primary) hypertension: Secondary | ICD-10-CM | POA: Insufficient documentation

## 2022-06-28 DIAGNOSIS — E8881 Metabolic syndrome: Secondary | ICD-10-CM | POA: Insufficient documentation

## 2022-06-28 DIAGNOSIS — E7849 Other hyperlipidemia: Secondary | ICD-10-CM | POA: Insufficient documentation

## 2022-06-28 DIAGNOSIS — E559 Vitamin D deficiency, unspecified: Secondary | ICD-10-CM | POA: Insufficient documentation

## 2022-06-28 DIAGNOSIS — E611 Iron deficiency: Secondary | ICD-10-CM | POA: Insufficient documentation

## 2022-06-28 NOTE — Progress Notes (Unsigned)
Chief Complaint:   OBESITY Patricia Lin is here to discuss her progress with her obesity treatment plan along with follow-up of her obesity related diagnoses. Patricia Lin is on the Category 1 Plan and states she is following her eating plan approximately 50% of the time. Patricia Lin states she is not exercising  Today's visit was #: 2 Starting weight: 203 lbs Starting date: 06/11/2022 Today's weight: 205 lbs Today's date: 06/28/2022 Total lbs lost to date: 0 Total lbs lost since last in-office visit: +2  Interim History: Patricia Lin is here today for her first follow-up office visit since starting the program with [place].  All blood work/ lab tests that were recently ordered by myself or an outside provider were reviewed with patient today per their request.   Extended time was spent counseling her on all new disease processes that were discovered or preexisting ones that are affected by BMI.  she understands that many of these abnormalities will need to monitored regularly along with the current treatment plan of prudent dietary changes, in which we are making each and every office visit, to improve these health parameters. Adisa had parties and was unable to start meal plan until one week ago. Breakfast:  2 eggs, 1 slice of bread and 1 cheese. Lunch:  4 oz of chicken or [place] Dinner:  sirloin steak, chicken, beef She has been hungry between lunch and dinner, she is also hungry after dinner.  She is drinking 1 gallon of water.  Subjective:   1. Insulin resistance New diagnosis. Discussed labs with patient today. Goal is HgbA1c < 5.7, fasting insulin closer to 5.   A1c *** 5.5 months ago.  Fasting insulin is 15.1.  Patricia Lin has specific hunger cravings.  2. Essential hypertension Discussed labs with patient today. Review: taking medications as instructed, no medication side effects noted, no chest pain on exertion, no dyspnea on exertion, no swelling of ankles.  CMP is WNL.  3. Iron  deficiency Discussed labs with patient today. Patricia Lin has been getting infusions with hematology .  Last labs were about one month ago with a hemoglobin of <8.0 at that time.  She did not get infusion with that.   4. Vitamin D deficiency New diagnosis.  Discussed labs with patient today. She is currently taking prescription vitamin D 50,000 IU each week. She denies nausea, vomiting or muscle weakness.  She is complaining of fatigue.   5. Other hyperlipidemia New diagnosis.  Discussed labs with patient today. Medication(s) reviewed. Patient denies myalgias.  Mild elevation of LDL at 106.  The 10-year ASCVD risk score (Arnett DK, et al., 2019) is: 1%   Values used to calculate the score:     Age: 74 years     Sex: Female     Is Non-Hispanic African American: Yes     Diabetic: No     Tobacco smoker: No     Systolic Blood Pressure: 130 mmHg     Is BP treated: Yes     HDL Cholesterol: 68 mg/dL     Total Cholesterol: 184 mg/dL  6. At risk of diabetes mellitus Patricia Lin is at higher than average risk for developing diabetes due to her obesity and new onset.   Assessment/Plan:   1. Insulin resistance Start metformin 250 mg BID per patient preference after [place] discussion about her disease process treatment options.  Handouts given on metformin and insulin resistance after [place] discussion with her.    Start- metFORMIN (GLUCOPHAGE) 500 MG tablet; 1/2  po with lunch daily and 1/2 tab with dinner  Dispense: 30 tablet; Refill: 0  2. Essential hypertension Cynda is working on healthy weight loss and exercise to improve blood pressure control. We will watch for signs of hypotension as she continues her lifestyle modifications. Blood pressure is at goal.  Continue medication per PCP, Norvasc.   3. Iron deficiency Continue care per hematologist.  Counseling done.  Continue taking iron TID per hematologist.  Counseling done.  Iron rich foods discussed with patient,   4. Vitamin D deficiency Low  Vitamin D level contributes to fatigue and are associated with obesity, breast, and colon cancer. She agrees to continue to take prescription Vitamin D @50 ,000 IU every week and will follow-up for routine testing of Vitamin D, at least 2-3 times per year to avoid over-replacement.  Vitamin d too low not at goal.  Start- Vitamin D, Ergocalciferol, (DRISDOL) 1.25 MG (50000 UNIT) CAPS capsule; Take 1 capsule (50,000 Units total) by mouth every 7 (seven) days.  Dispense: 4 capsule; Refill: 0  5. Other hyperlipidemia Cardiovascular risk and specific lipid/LDL goals reviewed.  We discussed several lifestyle modifications today and Reem will continue to work on diet, exercise and weight loss efforts. Orders and follow up as documented in patient record.   Counseling Intensive lifestyle modifications are the first line treatment for this issue. Dietary changes: Increase soluble fiber. Decrease simple carbohydrates. Exercise changes: Moderate to vigorous-intensity aerobic activity 150 minutes per week if tolerated. Lipid-lowering medications: see documented in medical record.  6. At risk of diabetes mellitus Patricia Lin was given approximately 9 minutes of diabetic education and counseling today. We discussed intensive lifestyle modifications today with an emphasis on weight loss as well as increasing exercise and decreasing simple carbohydrates in her diet. We also reviewed medication options with an emphasis on risk versus benefits of those discussed.   Repetitive spaced learning was employed today to elicit superior memory formation and behavioral change.   7. Obesity, current BMI 37.5 Had a [place] discussion with Patricia Lin about meal plan changes.  Marajade is currently in the action stage of change. As such, her goal is to continue with weight loss efforts. She has agreed to change to the Category 2 Plan.   Exercise goals:  As is  Behavioral modification strategies: increasing lean protein intake,  decreasing simple carbohydrates, better snacking choices, and avoiding temptations.  Patricia Lin has agreed to follow-up with our clinic in 2-3 weeks. She was informed of the importance of frequent follow-up visits to maximize her success with intensive lifestyle modifications for her multiple health conditions.   Objective:   Blood pressure 130/82, pulse 80, temperature 98 F (36.7 C), height 5\' 2"  (1.575 m), weight 205 lb (93 kg), last menstrual period 05/22/2022, SpO2 100 %. Body mass index is 37.49 kg/m.  General: Cooperative, alert, well developed, in no acute distress. HEENT: Conjunctivae and lids unremarkable. Cardiovascular: Regular rhythm.  Lungs: Normal work of breathing. Neurologic: No focal deficits.   Lab Results  Component Value Date   CREATININE 0.90 05/07/2022   BUN 18 05/07/2022   NA 139 05/07/2022   K 3.7 05/07/2022   CL 108 05/07/2022   CO2 24 05/07/2022   Lab Results  Component Value Date   ALT 27 05/07/2022   AST 24 05/07/2022   ALKPHOS 80 05/07/2022   BILITOT 0.6 05/07/2022   Lab Results  Component Value Date   HGBA1C 5.5 05/02/2022   HGBA1C 5.2 09/08/2018   Lab Results  Component Value Date  INSULIN 15.1 06/11/2022   INSULIN 11.5 09/08/2018   Lab Results  Component Value Date   TSH 0.907 05/07/2022   Lab Results  Component Value Date   CHOL 184 05/02/2022   HDL 68 05/02/2022   LDLCALC 106 (H) 05/02/2022   TRIG 53 05/02/2022   CHOLHDL 2.7 05/02/2022   Lab Results  Component Value Date   VD25OH 22.2 (L) 06/11/2022   VD25OH 9.6 (L) 09/08/2018   Lab Results  Component Value Date   WBC 6.0 05/07/2022   HGB 7.8 (L) 05/07/2022   HCT 27.2 (L) 05/07/2022   MCV 65.2 (L) 05/07/2022   PLT 361 05/07/2022   Lab Results  Component Value Date   IRON 29 05/07/2022   TIBC 472 (H) 05/07/2022   FERRITIN 10 (L) 05/07/2022   Attestation Statements:   Reviewed by clinician on day of visit: allergies, medications, problem list, medical history,  surgical history, family history, social history, and previous encounter notes.  I, Davy Pique, RMA, am acting as Location manager for [employer], DO.  I have reviewed the above documentation for accuracy and completeness, and I agree with the above. -  ***

## 2022-06-30 ENCOUNTER — Encounter (INDEPENDENT_AMBULATORY_CARE_PROVIDER_SITE_OTHER): Payer: Self-pay | Admitting: Family Medicine

## 2022-06-30 NOTE — Progress Notes (Signed)
Office: [phone]  /  Fax: [phone]    Date: 07/14/2022   Appointment Start Time: 4:04pm Duration: 31 minutes Provider: Lawerance Cruel, Psy.D. Type of Session: Individual Therapy  Location of Patient: Home (private location) Location of Provider: Provider's Home (private office) Type of Contact: Telepsychological Visit via MyChart Video Visit  Session Content:This provider called [name] at 4:03pm as she did not present for today's appointment. She indicated she forgot about the appointment, but could join. As such, today's appointment was initiated 4 minutes late. Patricia Lin is a 41 y.o. female presenting for a follow-up appointment to address the previously established treatment goal of increasing coping skills.Today's appointment was a telepsychological visit. Patricia Lin provided verbal consent for today's telepsychological appointment and she is aware she is responsible for securing confidentiality on her end of the session. Prior to proceeding with today's appointment, Patricia Lin's physical location at the time of this appointment was obtained as well a phone number she could be reached at in the event of technical difficulties. Patricia Lin and this provider participated in today's telepsychological service.   This provider conducted a brief check-in. Patricia Lin shared she lost 10 pounds. She discussed challenges with the food on her structured meal plan. She was encouraged to discuss further with [name]. Since the last appointment with this provider, she discussed a reduction in emotional and binge eating behaviors, adding she uses her snack calories for sweets. She reported engaging in self-talk has helped with the aforementioned. This provider and Patricia Lin also discussed other ways to measure success aside from the number on the scale. She reflected some clothes fit differently and discussed an increase in energy. Patricia Lin also reported she can now tie her shoes, go upstairs without "being winded," and keep  up with her son. She was encouraged to write down the aforementioned for future reference; she agreed. Of note, she indicated she did not reach out to any shared referral options, adding she did not have time and is trying to establish care for her son. [name] requested the previously shared e-mail be re-sent. Remainder of session focused on discussing physical and emotional hunger further. Overall, Patricia Lin was receptive to today's appointment as evidenced by openness to sharing, responsiveness to feedback, and willingness to discuss emotional and physical hunger .  Mental Status Examination:  Appearance: neat Behavior: appropriate to circumstances Mood: neutral Affect: mood congruent Speech: WNL Eye Contact: appropriate Psychomotor Activity: WNL Gait: unable to assess Thought Process: linear, logical, and goal directed and denies suicidal, homicidal, and self-harm ideation, plan and intent since the last appointment with this provider Thought Content/Perception: no hallucinations, delusions, bizarre thinking or behavior endorsed or observed Orientation: AAOx4 Memory/Concentration: memory, attention, language, and fund of knowledge intact  Insight: fair Judgment: fair  Interventions:  Conducted a brief chart review Conducted a risk assessment Provided empathic reflections and validation Employed supportive psychotherapy interventions to facilitate reduced distress and to improve coping skills with identified stressors Psychoeducation provided regarding emotional and physical hunger characteristics   DSM-5 Diagnosis(es):  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 Progress is limited, as Patricia Lin has just begun treatment with this provider; however, she is receptive  to the interaction and interventions and rapport is being established.   Plan: The next appointment is scheduled for 07/29/2022 at 8:30am, which will be via MyChart Video Visit. The next session will  focus on working towards the established treatment goal. Patricia Lin will establish care for traditional therapeutic services.

## 2022-07-02 ENCOUNTER — Inpatient Hospital Stay: Payer: No Typology Code available for payment source | Attending: Internal Medicine

## 2022-07-02 ENCOUNTER — Inpatient Hospital Stay (HOSPITAL_BASED_OUTPATIENT_CLINIC_OR_DEPARTMENT_OTHER): Payer: No Typology Code available for payment source | Admitting: Internal Medicine

## 2022-07-02 ENCOUNTER — Other Ambulatory Visit: Payer: Self-pay

## 2022-07-02 VITALS — BP 132/98 | HR 78 | Resp 18 | Wt 200.4 lb

## 2022-07-02 DIAGNOSIS — Z79899 Other long term (current) drug therapy: Secondary | ICD-10-CM | POA: Insufficient documentation

## 2022-07-02 DIAGNOSIS — D500 Iron deficiency anemia secondary to blood loss (chronic): Secondary | ICD-10-CM | POA: Diagnosis present

## 2022-07-02 DIAGNOSIS — N920 Excessive and frequent menstruation with regular cycle: Secondary | ICD-10-CM | POA: Diagnosis present

## 2022-07-02 LAB — CBC WITH DIFFERENTIAL/PLATELET
Abs Immature Granulocytes: 0 K/uL (ref 0.00–0.07)
Basophils Absolute: 0 K/uL (ref 0.0–0.1)
Basophils Relative: 1 %
Eosinophils Absolute: 0.1 K/uL (ref 0.0–0.5)
Eosinophils Relative: 1 %
HCT: 39.7 % (ref 36.0–46.0)
Hemoglobin: 12.6 g/dL (ref 12.0–15.0)
Immature Granulocytes: 0 %
Lymphocytes Relative: 42 %
Lymphs Abs: 2.1 K/uL (ref 0.7–4.0)
MCH: 24.7 pg — ABNORMAL LOW (ref 26.0–34.0)
MCHC: 31.7 g/dL (ref 30.0–36.0)
MCV: 77.8 fL — ABNORMAL LOW (ref 80.0–100.0)
Monocytes Absolute: 0.4 K/uL (ref 0.1–1.0)
Monocytes Relative: 8 %
Neutro Abs: 2.4 K/uL (ref 1.7–7.7)
Neutrophils Relative %: 48 %
Platelets: 349 K/uL (ref 150–400)
RBC: 5.1 MIL/uL (ref 3.87–5.11)
RDW: 21.8 % — ABNORMAL HIGH (ref 11.5–15.5)
WBC: 5.1 K/uL (ref 4.0–10.5)
nRBC: 0 % (ref 0.0–0.2)

## 2022-07-02 LAB — IRON AND IRON BINDING CAPACITY (CC-WL,HP ONLY)
Iron: 92 ug/dL (ref 28–170)
Saturation Ratios: 24 % (ref 10.4–31.8)
TIBC: 381 ug/dL (ref 250–450)
UIBC: 289 ug/dL

## 2022-07-02 NOTE — Progress Notes (Signed)
[facility] Telephone:([phone]   Fax:([phone]) [phone]  OFFICE PROGRESS NOTE  Rema Fendt, NP [address] [place] [address]  DIAGNOSIS: Iron deficiency anemia secondary to menorrhagia.  PRIOR THERAPY: Ferrous sulfate 325 mg p.o. 3 times daily with no improvement.  CURRENT THERAPY: Venofer 500 Mg IV weekly for 2 weeks, last dose was giving 05/13/2022  INTERVAL HISTORY: Patricia Lin 41 y.o. female returns to the clinic today for follow-up visit.  The patient is feeling fine today with no concerning complaints.  She has significant improvement in her stamina and no significant fatigue.  She has no chest pain, shortness of breath, cough or hemoptysis.  She denied having any nausea, vomiting, diarrhea but has occasional constipation.  She has no headache or visual changes.  She denied having any weight loss or night sweats.  The patient tolerated the previous iron infusion fairly well.  She is here today for evaluation and repeat blood work.  MEDICAL HISTORY: Past Medical History:  Diagnosis Date   Abnormal Pap smear    Adenomyosis    Anemia    Anxiety    Back pain    BV (bacterial vaginosis) 2011   Chlamydia infection    CIN I (cervical intraepithelial neoplasia I) 2008   Depression    Dysmenorrhea    Dyspnea    Dysrhythmia 2009   TACHYCARDIA;   HAD W/U 2010? 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  B/P nl, and 122/76 on repeat-sitting   Postpartum depression 2011   NO MEDS   Pregnancy induced hypertension    ALL PREGNANCIES   Shortness of breath    WITH EXERTION SICNE PREGNANCY   Tachycardia    Tachycardia 2010   HAS HAD WORKUP. UNSURE IF HAS POSSIBLE HEART VALVE ISSUE   Vulvitis 2009    ALLERGIES:  is allergic to latex.  MEDICATIONS:  Current Outpatient Medications  Medication Sig Dispense Refill   amLODipine (NORVASC) 2.5 MG tablet Take 1 tablet (2.5 mg total) by mouth daily. 90 tablet 3   ferrous sulfate 325 (65 FE) MG tablet Take 1 tablet (325 mg total) by mouth 3 (three) times daily with meals. 90 tablet 3   metFORMIN (GLUCOPHAGE) 500 MG tablet 1/2 po with lunch daily and 1/2 tab with dinner 30 tablet 0   tranexamic acid (LYSTEDA) 650 MG TABS tablet Take 2 tablets (1,300 mg total) by mouth 3 (three) times daily. 30 tablet 2   Vitamin D, Ergocalciferol, (DRISDOL) 1.25 MG (50000 UNIT) CAPS capsule Take 1 capsule (50,000 Units total) by mouth every 7 (seven) days. 4 capsule 0   No current facility-administered  medications for this visit.    SURGICAL HISTORY:  Past Surgical History:  Procedure Laterality Date   BREAST REDUCTION SURGERY  12/08/2006   BREAST SURGERY     CERVICAL BIOPSY  W/ LOOP ELECTRODE EXCISION  2010   DILATION AND CURETTAGE OF UTERUS     FINGER SURGERY     Right index finger   TUBAL LIGATION  01/09/2013   Procedure: POST PARTUM TUBAL LIGATION;  Surgeon: Michael Litter, MD;  Location: [facility];  Service: Gynecology;  Laterality: Bilateral;  post partum tubal ligation bilateral   US ECHOCARDIOGRAPHY  06/18/2009   ef 55-60%   WISDOM TOOTH EXTRACTION      REVIEW OF SYSTEMS:  A comprehensive review of systems was negative.   PHYSICAL EXAMINATION: General appearance: alert, cooperative, and no distress Head: Normocephalic, without obvious abnormality,  atraumatic Neck: no adenopathy, no JVD, supple, symmetrical, trachea midline, and thyroid not enlarged, symmetric, no tenderness/mass/nodules Lymph nodes: Cervical, supraclavicular, and axillary nodes normal. Resp: clear to auscultation bilaterally Back: symmetric, no curvature. ROM normal. No CVA tenderness. Cardio: regular rate and rhythm, S1, S2 normal, no murmur, click, rub or gallop GI: soft, non-tender; bowel sounds normal; no masses,  no organomegaly Extremities: extremities normal, atraumatic, no cyanosis or edema  ECOG PERFORMANCE STATUS: 0 - Asymptomatic  Blood pressure (!) 132/98, pulse 78, resp. rate 18, weight 200 lb 7 oz (90.9 kg), SpO2 100 %.  LABORATORY DATA: Lab Results  Component Value Date   WBC 5.1 07/02/2022   HGB 12.6 07/02/2022   HCT 39.7 07/02/2022   MCV 77.8 (L) 07/02/2022   PLT 349 07/02/2022      Chemistry      Component Value Date/Time   NA 139 05/07/2022 1403   NA 140 05/02/2022 1000   K 3.7 05/07/2022 1403   CL 108 05/07/2022 1403   CO2 24 05/07/2022 1403   BUN 18 05/07/2022 1403   BUN 14 05/02/2022 1000   CREATININE 0.90 05/07/2022 1403   CREATININE 0.64 01/17/2013 1720      Component Value Date/Time   CALCIUM 9.6 05/07/2022 1403   ALKPHOS 80 05/07/2022 1403   AST 24 05/07/2022 1403   ALT 27 05/07/2022 1403   BILITOT 0.6 05/07/2022 1403       RADIOGRAPHIC STUDIES: ECHOCARDIOGRAM COMPLETE  Result Date: 06/20/2022    ECHOCARDIOGRAM REPORT   Patient Name:   Patricia Lin Date of Exam: 06/20/2022 Medical Rec #:  [id number]     Height:       62.0 in Accession #:    [id number]    Weight:       203.0 lb Date of Birth:  1981/11/14     BSA:          1.924 m Patient Age:    40 years      BP:           134/87 mmHg Patient Gender: F             HR:           63 bpm. Exam Location:  [address] Procedure: 2D Echo, Cardiac Doppler, Color Doppler and Strain Analysis Indications:    Murmur [R01.1 (ICD-10-CM)]  History:        Patient has prior history  of Echocardiogram examinations, most                 recent 09/23/2012. Signs/Symptoms:Murmur; Risk  Factors:Hypertension.  Sonographer:    Margreta Journey RDCS Referring Phys: [id number] PAULA V ROSS IMPRESSIONS  1. Left ventricular ejection fraction, by estimation, is 60 to 65%. The left ventricle has normal function. The left ventricle has no regional wall motion abnormalities. There is mild left ventricular hypertrophy. Left ventricular diastolic parameters were normal.  2. Right ventricular systolic function is normal. The right ventricular size is normal. Tricuspid regurgitation signal is inadequate for assessing PA pressure.  3. The mitral valve is normal in structure. Trivial mitral valve regurgitation. No evidence of mitral stenosis.  4. The aortic valve is tricuspid. Aortic valve regurgitation is not visualized. No aortic stenosis is present.  5. The inferior vena cava is dilated in size with >50% respiratory variability, suggesting right atrial pressure of 8 mmHg. FINDINGS  Left Ventricle: Left ventricular ejection fraction, by estimation, is 60 to 65%. The left ventricle has normal function. The left ventricle has no regional wall motion abnormalities. The left ventricular internal cavity size was normal in size. There is  mild left ventricular hypertrophy. Left ventricular diastolic parameters were normal. Right Ventricle: The right ventricular size is normal. No increase in right ventricular wall thickness. Right ventricular systolic function is normal. Tricuspid regurgitation signal is inadequate for assessing PA pressure. Left Atrium: Left atrial size was normal in size. Right Atrium: Right atrial size was normal in size. Pericardium: There is no evidence of pericardial effusion. Mitral Valve: The mitral valve is normal in structure. Trivial mitral valve regurgitation. No evidence of mitral valve stenosis. Tricuspid Valve: The tricuspid valve is normal in structure. Tricuspid valve  regurgitation is trivial. Aortic Valve: The aortic valve is tricuspid. Aortic valve regurgitation is not visualized. No aortic stenosis is present. Pulmonic Valve: The pulmonic valve was not well visualized. Pulmonic valve regurgitation is trivial. Aorta: The aortic root and ascending aorta are structurally normal, with no evidence of dilitation. Venous: The inferior vena cava is dilated in size with greater than 50% respiratory variability, suggesting right atrial pressure of 8 mmHg. IAS/Shunts: The interatrial septum was not well visualized.  LEFT VENTRICLE PLAX 2D LVIDd:         4.60 cm   Diastology LVIDs:         2.70 cm   LV e' medial:    8.38 cm/s LV PW:         1.00 cm   LV E/e' medial:  12.3 LV IVS:        1.00 cm   LV e' lateral:   11.70 cm/s LVOT diam:     1.80 cm   LV E/e' lateral: 8.8 LV SV:         57 LV SV Index:   30 LVOT Area:     2.54 cm  RIGHT VENTRICLE             IVC RV Basal diam:  3.00 cm     IVC diam: 2.20 cm RV S prime:     15.10 cm/s TAPSE (M-mode): 3.5 cm LEFT ATRIUM             Index        RIGHT ATRIUM           Index LA diam:        3.80 cm 1.98 cm/m   RA Area:     10.70 cm LA Vol (A2C):   45.2 ml 23.49 ml/m  RA Volume:   20.60 ml  10.71 ml/m LA Vol (A4C):   75.9 ml 39.45  ml/m LA Biplane Vol: 60.3 ml 31.34 ml/m  AORTIC VALVE LVOT Vmax:   91.80 cm/s LVOT Vmean:  68.500 cm/s LVOT VTI:    0.224 m  AORTA Ao Root diam: 2.90 cm Ao Asc diam:  3.20 cm Ao Desc diam: 1.90 cm MITRAL VALVE MV Area (PHT): 3.17 cm     SHUNTS MV Decel Time: 239 msec     Systemic VTI:  0.22 m MV E velocity: 103.00 cm/s  Systemic Diam: 1.80 cm MV A velocity: 106.00 cm/s MV E/A ratio:  0.97 Epifanio Lesches MD Electronically signed by Epifanio Lesches MD Signature Date/Time: 06/20/2022/2:51:27 PM    Final     ASSESSMENT AND PLAN: This is a very pleasant 41 years old African-American female with history of iron deficiency anemia secondary to menorrhagia and not responding to the oral iron tablets.  The  patient was treated recently with iron infusion with Venofer 500 Mg IV weekly for 2 weeks. She had repeat CBC today that showed significant improvement in her hemoglobin and hematocrit.  Her hemoglobin improved from 7.8 to 12.6. Iron study and ferritin are still pending. I recommended for the patient to continue with the oral iron tablets for now as maintenance. I will see her back for follow-up visit in 3 months for evaluation and repeat CBC, iron study and ferritin and if needed I would consider her for iron infusion again in the future. The patient was advised to call immediately if she has any other concerning symptoms in the interval. The patient voices understanding of current disease status and treatment options and is in agreement with the current care plan.  All questions were answered. The patient knows to call the clinic with any problems, questions or concerns. We can certainly see the patient much sooner if necessary.  The total time spent in the appointment was 20 minutes.  Disclaimer: This note was dictated with voice recognition software. Similar sounding words can inadvertently be transcribed and may not be corrected upon review.

## 2022-07-03 LAB — FERRITIN: Ferritin: 30 ng/mL (ref 11–307)

## 2022-07-08 ENCOUNTER — Other Ambulatory Visit: Payer: Self-pay

## 2022-07-08 ENCOUNTER — Other Ambulatory Visit: Payer: Self-pay | Admitting: Internal Medicine

## 2022-07-08 ENCOUNTER — Encounter (INDEPENDENT_AMBULATORY_CARE_PROVIDER_SITE_OTHER): Payer: Self-pay | Admitting: Family Medicine

## 2022-07-08 ENCOUNTER — Encounter: Payer: Self-pay | Admitting: Internal Medicine

## 2022-07-08 DIAGNOSIS — D509 Iron deficiency anemia, unspecified: Secondary | ICD-10-CM

## 2022-07-08 DIAGNOSIS — N920 Excessive and frequent menstruation with regular cycle: Secondary | ICD-10-CM

## 2022-07-08 MED ORDER — FERROUS SULFATE 325 (65 FE) MG PO TABS: 325.0000 mg | ORAL_TABLET | Freq: Three times a day (TID) | ORAL | 3 refills | Status: DC

## 2022-07-09 ENCOUNTER — Ambulatory Visit: Payer: No Typology Code available for payment source | Admitting: Obstetrics and Gynecology

## 2022-07-09 NOTE — Progress Notes (Signed)
GYNECOLOGY  VISIT   HPI: 41 y.o.   Married Black or [place] American Not Hispanic or Latino  female   [phone] with Patient's last menstrual period was 07/11/2022 (exact date).   here for EMB due to menorrhagia leading to anemia. Ultrasound with signs of adenomyosis, otherwise normal.   She has been getting iron transfusions with Hematology and she wasn't anemic on her last blood draw a few weeks ago. In May her Hgb was 7.4.   05/02/22: TSH 0.892, negative GC/CT/Trich, Pap normal with negative HPV.  She isn't a candidate for OCP's. Reports the Lysteda didn't help her cycle. She didn't like how she felt on aygestin.   GYNECOLOGIC HISTORY: Patient's last menstrual period was 07/11/2022 (exact date). Contraception: BTL Menopausal hormone therapy: n/a        OB History     Gravida  7   Para  4   Term  4   Preterm      AB  3   Living  4      SAB  1   IAB  2   Ectopic      Multiple      Live Births  4        Obstetric Comments  2011 PRECLAMPSIA PP; IN ICU X 3 DAYS            Patient Active Problem List   Diagnosis Date Noted   Insulin resistance 06/28/2022   Essential hypertension 06/28/2022   Iron deficiency 06/28/2022   Vitamin D deficiency 06/28/2022   Other hyperlipidemia 06/28/2022   At risk of diabetes mellitus 06/28/2022   Iron deficiency anemia due to chronic blood loss 05/07/2022   Right lower quadrant abdominal pain 01/28/2019   Generalized headaches 12/17/2018   Anxiety 12/17/2018   History of anemia 05/26/2018   Anemia 12/05/2017   Routine health maintenance 03/03/2013   S/P tubal ligation 02/09/2013   Vaginal delivery 01/09/2013   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   Fracture of left ulna 09/27/12 10/23/2012   Cervical funneling 09/18/2012   Cervical shortening 09/18/2012   Symptomatic anemia 09/03/2012   H/O pre-eclampsia in prior pregnancy, currently pregnant 08/19/2012   Chronic hypertension in pregnancy 08/19/2012   Hx LEEP (loop  electrosurgical excision procedure), cervix, pregnancy 08/19/2012   Hx of postpartum depression, currently pregnant 08/19/2012   Late prenatal care 08/19/2012   Hx of precipitous labor and deliveries, antepartum 08/19/2012   Muscle tension HAs 08/19/2012    Past Medical History:  Diagnosis Date   Abnormal Pap smear    Adenomyosis    Anemia    Anxiety    Back pain    BV (bacterial vaginosis) 2011   Chlamydia infection    CIN I (cervical intraepithelial neoplasia I) 2008   Depression    Dysmenorrhea    Dyspnea    Dysrhythmia 2009   TACHYCARDIA;   HAD W/U 2010? 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  B/P nl, and 122/76 on repeat-sitting   Postpartum depression 2011   NO MEDS   Pregnancy induced hypertension    ALL PREGNANCIES   Shortness of breath    WITH EXERTION SICNE PREGNANCY   Tachycardia    Tachycardia 2010   HAS HAD WORKUP. UNSURE IF HAS POSSIBLE HEART VALVE ISSUE   Vulvitis 2009    Past Surgical History:  Procedure Laterality Date   BREAST REDUCTION SURGERY  12/08/2006   BREAST SURGERY     CERVICAL BIOPSY  W/ LOOP ELECTRODE EXCISION  2010   DILATION AND CURETTAGE OF UTERUS     FINGER SURGERY     Right index finger   TUBAL LIGATION  01/09/2013   Procedure: POST PARTUM TUBAL LIGATION;  Surgeon: Michael Litter, MD;  Location: [facility];   Service: Gynecology;  Laterality: Bilateral;  post partum tubal ligation bilateral   US ECHOCARDIOGRAPHY  06/18/2009   ef 55-60%   WISDOM TOOTH EXTRACTION      Current Outpatient Medications  Medication Sig Dispense Refill   amLODipine (NORVASC) 2.5 MG tablet Take 1 tablet (2.5 mg total) by mouth daily. 90 tablet 3   ferrous sulfate 325 (65 FE) MG tablet Take 1 tablet (325 mg total) by mouth 3 (three) times daily with meals. 90 tablet 3   metFORMIN (GLUCOPHAGE) 500 MG tablet 1/2 po with lunch daily 15 tablet 0   tranexamic acid (LYSTEDA) 650 MG TABS tablet Take 2 tablets (1,300 mg total) by mouth 3 (three) times daily. 30 tablet 2   No current facility-administered medications for this visit.     ALLERGIES: Latex  Family History  Problem Relation Age of Onset   Hypertension Mother    Hyperlipidemia Mother    Coronary artery disease Mother    Heart disease Mother        high cholesterol   Asthma Mother    Diabetes Mother    Depression Mother    Anxiety disorder Mother    Sleep apnea Mother    Obesity Mother    Eating disorder Mother    Hypertension Father    High Cholesterol Father    Heart disease Father    Hypertension Maternal Grandmother    Hypertension Maternal Grandfather    Asthma Daughter    Alcohol abuse Maternal Uncle    Drug abuse Maternal Uncle     Social History   Socioeconomic History   Marital status: Married    Spouse name: Jerl Santos   Number of children: 3   Years of education: 13   Highest education level: Not on file  Occupational History   Occupation: Physiological scientist: [employer]   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  Tobacco Use   Smoking status: Never   Smokeless tobacco: Never  Vaping Use   Vaping Use: Never used  Substance and Sexual Activity   Alcohol use: Yes    Comment: socially   Drug use: No   Sexual activity: Yes    Partners: Male    Birth control/protection: Surgical  Other Topics Concern   Not on  file  Social History Narrative   Not on file   Social Determinants of Health   Financial Resource Strain: Not  on file  Food Insecurity: Not on file  Transportation Needs: Not on file  Physical Activity: Not on file  Stress: Not on file  Social Connections: Not on file  Intimate Partner Violence: Not on file    Review of Systems  All other systems reviewed and are negative.   PHYSICAL EXAMINATION:    BP 108/72   Pulse 78   LMP 07/11/2022 (Exact Date)   SpO2 99%     General appearance: alert, cooperative and appears stated age Neck: no adenopathy, supple, symmetrical, trachea midline and thyroid normal to inspection and palpation Heart: regular rate and rhythm Lungs: CTAB Abdomen: soft, non-tender; bowel sounds normal; no masses,  no organomegaly Extremities: normal, atraumatic, no cyanosis Skin: normal color, texture and turgor, no rashes or lesions Lymph: normal cervical supraclavicular and inguinal nodes Neurologic: grossly normal   Pelvic: External genitalia:  no lesions              Urethra:  normal appearing urethra with no masses, tenderness or lesions              Bartholins and Skenes: normal                 Vagina: normal appearing vagina with normal color and discharge, no lesions              Cervix: no lesions                The risks of endometrial biopsy were reviewed and a consent was obtained.  A speculum was placed in the vagina and the cervix was cleansed with betadine. A tenaculum was placed on the cervix and the pipelle was placed into the endometrial cavity. The uterus sounded to 10 cm. The endometrial biopsy was performed, taking care to get a representative sample, sampling 360 degrees of the uterine cavity. Moderate tissue was obtained. The tenaculum and speculum were removed. There were no complications.   Chaperone was present for exam.  1. Menorrhagia with regular cycle U/S concerning for adenomyosis. Not a candidate for OCP's, didn't  tolerate aygestin, lysteda didn't help. Not interested in an IUD. Interested in hysterectomy - Endometrial biopsy -Discussed total laparoscopic hysterectomy, bilateral salpingectomies and cystoscopy. Reviewed the risks of the procedure, including infection, bleeding, damage to bowel/badder/vessels/ureters. All of her questions were answered   2. Iron deficiency anemia due to chronic blood loss Recent iron transfusion with improvement in her hgb Advised to stay on oral iron  3. Essential hypertension Controlled on medication  In addition to the biopsy, time was spent in counseling for management. Hysterectomy reviewed in detail. Preoperative exam done.

## 2022-07-10 ENCOUNTER — Ambulatory Visit (INDEPENDENT_AMBULATORY_CARE_PROVIDER_SITE_OTHER): Payer: No Typology Code available for payment source | Admitting: Family Medicine

## 2022-07-10 ENCOUNTER — Encounter (INDEPENDENT_AMBULATORY_CARE_PROVIDER_SITE_OTHER): Payer: Self-pay | Admitting: Family Medicine

## 2022-07-10 VITALS — BP 127/86 | HR 70 | Temp 97.4°F | Ht 62.0 in | Wt 195.0 lb

## 2022-07-10 DIAGNOSIS — Z6835 Body mass index (BMI) 35.0-35.9, adult: Secondary | ICD-10-CM

## 2022-07-10 DIAGNOSIS — E669 Obesity, unspecified: Secondary | ICD-10-CM

## 2022-07-10 DIAGNOSIS — E8881 Metabolic syndrome: Secondary | ICD-10-CM

## 2022-07-10 DIAGNOSIS — I10 Essential (primary) hypertension: Secondary | ICD-10-CM | POA: Diagnosis not present

## 2022-07-10 MED ORDER — METFORMIN HCL 500 MG PO TABS
ORAL_TABLET | ORAL | 0 refills | Status: DC
Start: 2022-07-10 — End: 2022-07-29

## 2022-07-12 ENCOUNTER — Other Ambulatory Visit: Payer: Self-pay | Admitting: Obstetrics and Gynecology

## 2022-07-12 DIAGNOSIS — N920 Excessive and frequent menstruation with regular cycle: Secondary | ICD-10-CM

## 2022-07-14 ENCOUNTER — Telehealth (INDEPENDENT_AMBULATORY_CARE_PROVIDER_SITE_OTHER): Payer: No Typology Code available for payment source | Admitting: Psychology

## 2022-07-14 DIAGNOSIS — F5081 Binge eating disorder, mild: Secondary | ICD-10-CM

## 2022-07-14 DIAGNOSIS — F331 Major depressive disorder, recurrent, moderate: Secondary | ICD-10-CM | POA: Diagnosis not present

## 2022-07-14 NOTE — Telephone Encounter (Signed)
Refill request received for Norethindrone 5mg  tab.  Per review of OV notes dated 05/28/22, switched to Lysteda.   Call to patient. Confirmed no longer taking Norethindrone, currently taking Lysteda. Has refill of Lysteda on file. Will see Dr. 05/30/22 on 07/18/22 for EMB, will further discuss if refills needed.   Norethindrone Rx refused.   Routing to provider for final review. Patient is agreeable to disposition. Will close encounter.

## 2022-07-16 ENCOUNTER — Encounter (INDEPENDENT_AMBULATORY_CARE_PROVIDER_SITE_OTHER): Payer: Self-pay

## 2022-07-18 ENCOUNTER — Other Ambulatory Visit (HOSPITAL_COMMUNITY)
Admission: RE | Admit: 2022-07-18 | Discharge: 2022-07-18 | Disposition: A | Discharge status: Home / Self Care | Payer: No Typology Code available for payment source | Source: Ambulatory Visit | Attending: Obstetrics and Gynecology | Admitting: Obstetrics and Gynecology

## 2022-07-18 ENCOUNTER — Encounter: Payer: Self-pay | Admitting: Obstetrics and Gynecology

## 2022-07-18 ENCOUNTER — Telehealth: Payer: Self-pay | Admitting: Obstetrics and Gynecology

## 2022-07-18 ENCOUNTER — Ambulatory Visit (INDEPENDENT_AMBULATORY_CARE_PROVIDER_SITE_OTHER): Payer: No Typology Code available for payment source | Admitting: Obstetrics and Gynecology

## 2022-07-18 VITALS — BP 108/72 | HR 78

## 2022-07-18 DIAGNOSIS — I10 Essential (primary) hypertension: Secondary | ICD-10-CM | POA: Diagnosis not present

## 2022-07-18 DIAGNOSIS — N920 Excessive and frequent menstruation with regular cycle: Secondary | ICD-10-CM

## 2022-07-18 DIAGNOSIS — D500 Iron deficiency anemia secondary to blood loss (chronic): Secondary | ICD-10-CM

## 2022-07-18 NOTE — Patient Instructions (Signed)

## 2022-07-18 NOTE — Telephone Encounter (Signed)
Surgery: CPT [phone] - Total Laparoscopic Hysterectomy with Salpingectomy and/or Oophorectomy,  cystoscopy  Diagnosis: N92.0 Menorrhagia with Regular Cycles,  anemia  Location: [facility]  Status: Outpatient with Overnight Bed  Time: 90 Minutes  Assistant: Conley Simmonds, MD, Evern Core  Urgency: At Patient's Convenience  Pre-Op Appointment: Completed  Post-Op Appointment(s): 1 Week, 4 Weeks  Time Out Of Work: 6 Weeks  The patient has struggled with severe anemia, she is currently not anemic (s/p iron transfusions). We need to coordinate this when she isn't severely anemic.

## 2022-07-20 NOTE — Progress Notes (Unsigned)
Chief Complaint:   OBESITY Patricia Lin is here to discuss her progress with her obesity treatment plan along with follow-up of her obesity related diagnoses. Patricia Lin is on the Category 2 Plan and states she is following her eating plan approximately 95% of the time. Patricia Lin states she is doing 0 minutes 0 times per week.  Today's visit was #: 3 Starting weight: 203 lbs Starting date: 06/11/2022 Today's weight: 195 lbs Today's date: 07/10/2022 Total lbs lost to date: 8 Total lbs lost since last in-office visit: 10  Interim History: Patricia Lin continues to do well with her plan.  She is doing well with following her meal plan, but sometimes struggles to eat all of her food on her plan.  Subjective:   1. Insulin resistance Patricia Lin notes decreased polyphagia on metformin. She is struggling to eat all of the food on her plan, and she is having some diarrhea which has not yet started to improve.   2. Essential hypertension Patricia Lin's blood pressure is well controlled. She denies signs of hypotension.   Assessment/Plan:   1. Insulin resistance Patricia Lin agreed to decrease metformin to 500 mg 1/2 tablet by mouth at lunch, and we will refill for 1 month.  - metFORMIN (GLUCOPHAGE) 500 MG tablet; 1/2 po with lunch daily  Dispense: 15 tablet; Refill: 0  2. Essential hypertension Patricia Lin will continue with her diet, exercise, and weight loss.  She may be able to decrease the need for medications when she reaches her weight goal.  3. Obesity, Current BMI 35.7 Patricia Lin is currently in the action stage of change. As such, her goal is to continue with weight loss efforts. She has agreed to the Category 1 Plan.   Behavioral modification strategies: increasing lean protein intake and meal planning and cooking strategies.  Patricia Lin has agreed to follow-up with our clinic in 2 weeks. She was informed of the importance of frequent follow-up visits to maximize her success with intensive lifestyle modifications for her  multiple health conditions.   Objective:   Blood pressure 127/86, pulse 70, temperature (!) 97.4 F (36.3 C), height 5\' 2"  (1.575 m), weight 195 lb (88.5 kg), SpO2 100 %. Body mass index is 35.67 kg/m.  General: Cooperative, alert, well developed, in no acute distress. HEENT: Conjunctivae and lids unremarkable. Cardiovascular: Regular rhythm.  Lungs: Normal work of breathing. Neurologic: No focal deficits.   Lab Results  Component Value Date   CREATININE 0.90 05/07/2022   BUN 18 05/07/2022   NA 139 05/07/2022   K 3.7 05/07/2022   CL 108 05/07/2022   CO2 24 05/07/2022   Lab Results  Component Value Date   ALT 27 05/07/2022   AST 24 05/07/2022   ALKPHOS 80 05/07/2022   BILITOT 0.6 05/07/2022   Lab Results  Component Value Date   HGBA1C 5.5 05/02/2022   HGBA1C 5.2 09/08/2018   Lab Results  Component Value Date   INSULIN 15.1 06/11/2022   INSULIN 11.5 09/08/2018   Lab Results  Component Value Date   TSH 0.907 05/07/2022   Lab Results  Component Value Date   CHOL 184 05/02/2022   HDL 68 05/02/2022   LDLCALC 106 (H) 05/02/2022   TRIG 53 05/02/2022   CHOLHDL 2.7 05/02/2022   Lab Results  Component Value Date   VD25OH 22.2 (L) 06/11/2022   VD25OH 9.6 (L) 09/08/2018   Lab Results  Component Value Date   WBC 5.1 07/02/2022   HGB 12.6 07/02/2022   HCT 39.7 07/02/2022  MCV 77.8 (L) 07/02/2022   PLT 349 07/02/2022   Lab Results  Component Value Date   IRON 92 07/02/2022   TIBC 381 07/02/2022   FERRITIN 30 07/02/2022   Attestation Statements:   Reviewed by clinician on day of visit: allergies, medications, problem list, medical history, surgical history, family history, social history, and previous encounter notes.   I, Burt Knack, am acting as transcriptionist for Quillian Quince, MD.  I have reviewed the above documentation for accuracy and completeness, and I agree with the above. -  Quillian Quince, MD

## 2022-07-22 LAB — SURGICAL PATHOLOGY

## 2022-07-24 NOTE — Telephone Encounter (Signed)
Spoke with patient. Patient request return call after 12pm today.

## 2022-07-24 NOTE — Telephone Encounter (Signed)
Spoke with patient.  Reviewed surgery dates. Patient request to schedule at the end of 09/2022, declines earlier dates. Advised October schedule is not finalized, I will return call once this has been completed to confirm date. Patient verbalizes understanding and is agreeable.

## 2022-07-29 ENCOUNTER — Other Ambulatory Visit (INDEPENDENT_AMBULATORY_CARE_PROVIDER_SITE_OTHER): Payer: Self-pay | Admitting: Family Medicine

## 2022-07-29 ENCOUNTER — Encounter (INDEPENDENT_AMBULATORY_CARE_PROVIDER_SITE_OTHER): Payer: Self-pay | Admitting: Family Medicine

## 2022-07-29 ENCOUNTER — Telehealth (INDEPENDENT_AMBULATORY_CARE_PROVIDER_SITE_OTHER): Payer: No Typology Code available for payment source | Admitting: Psychology

## 2022-07-29 ENCOUNTER — Encounter (INDEPENDENT_AMBULATORY_CARE_PROVIDER_SITE_OTHER): Payer: No Typology Code available for payment source | Admitting: Family Medicine

## 2022-07-29 VITALS — BP 93/64 | HR 79 | Temp 98.0°F | Ht 62.0 in | Wt 194.0 lb

## 2022-07-29 DIAGNOSIS — D509 Iron deficiency anemia, unspecified: Secondary | ICD-10-CM

## 2022-07-29 DIAGNOSIS — F331 Major depressive disorder, recurrent, moderate: Secondary | ICD-10-CM | POA: Diagnosis not present

## 2022-07-29 DIAGNOSIS — I10 Essential (primary) hypertension: Secondary | ICD-10-CM

## 2022-07-29 DIAGNOSIS — F5081 Binge eating disorder: Secondary | ICD-10-CM

## 2022-07-29 DIAGNOSIS — E669 Obesity, unspecified: Secondary | ICD-10-CM

## 2022-07-29 DIAGNOSIS — E8881 Metabolic syndrome: Secondary | ICD-10-CM

## 2022-07-29 MED ORDER — METFORMIN HCL 500 MG PO TABS: 500.0000 mg | ORAL_TABLET | Freq: Every day | ORAL | 0 refills | Status: DC

## 2022-07-29 NOTE — Progress Notes (Signed)
  Office: [phone]  /  Fax: [phone]    Date: July 29, 2022    Appointment Start Time: 8:30am Duration: 22 minutes Provider: Lawerance Lin, Psy.D. Type of Session: Individual Therapy  Location of Patient: Home (private location) Location of Provider: Provider's Home (private office) Type of Contact: Telepsychological Visit via MyChart Video Visit  Session Content: Berdene is a 41 y.o. female presenting for a follow-up appointment to address the previously established treatment goal of increasing coping skills.Today's appointment was a telepsychological visit. Patricia Lin provided verbal consent for today's telepsychological appointment and she is aware she is responsible for securing confidentiality on her end of the session. Prior to proceeding with today's appointment, Patricia Lin's physical location at the time of this appointment was obtained as well a phone number she could be reached at in the event of technical difficulties. Patricia Lin and this provider participated in today's telepsychological service.   This provider conducted a brief check-in. Patricia Lin reported, "I'm tired." She feels that the increased dose for metformin is making her more tired and experience stomach discomfort. She was encouraged to speak with her prescribing provider; she agreed. Patricia Lin requested the previously shared e-mail be re-sent again. During today's appointment, she confirmed it was received. Regarding her eating habits, she indicated, "It was very difficult the past two weeks" due to celebrations. Further explored and processed. She acknowledged, "I did not do any binge eating." She also described making better choices and engaging in portion control. Due to upcoming celebrations for her birthday, psychoeducation regarding making better choices and engaging in portion control during the holidays/celebrations was provided. More specifically, this provider discussed the following strategies: coming to meals hungry, but  not starving; avoid filling up on appetizers; managing portion sizes; not completely depriving yourself; making the plate colorful (e.g., vegetables); pacing yourself (e.g., waiting 10 minutes before going back for seconds); taking advantage of the nutritious foods; practicing mindfulness; staying hydrated; and avoid bringing home leftovers. Overall, Patricia Lin was receptive to today's appointment as evidenced by openness to sharing, responsiveness to feedback, and willingness to implement discussed strategies .  Mental Status Examination:  Appearance: neat Behavior: appropriate to circumstances Mood: neutral Affect: mood congruent Speech: WNL Eye Contact: appropriate Psychomotor Activity: WNL Gait: unable to assess Thought Process: linear, logical, and goal directed and denies suicidal, homicidal, and self-harm ideation, plan and intent  Thought Content/Perception: no hallucinations, delusions, bizarre thinking or behavior endorsed or observed Orientation: AAOx4 Memory/Concentration: memory, attention, language, and fund of knowledge intact  Insight: fair Judgment: fair  Interventions:  Conducted a brief chart review Conducted a risk assessment Provided empathic reflections and validation Reviewed content from the previous session Provided positive reinforcement Employed supportive psychotherapy interventions to facilitate reduced distress and to improve coping skills with identified stressors Psychoeducation provided regarding strategies for celebrations/holidays/vacations  DSM-5 Diagnosis(es):  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 [name] has demonstrated progress in her goal as evidenced by increased awareness of hunger patterns.   Plan: Based on [name]'s  schedule, the next appointment is scheduled for 08/19/2022 at 8:30am, which will be via MyChart Video Visit. The next session will focus on working towards the established treatment goal. [name] agreed to contact shared referral options to establish care.

## 2022-07-30 LAB — CBC WITH DIFFERENTIAL/PLATELET
Basophils Absolute: 0 10*3/uL (ref 0.0–0.2)
Basos: 0 %
EOS (ABSOLUTE): 0.1 10*3/uL (ref 0.0–0.4)
Eos: 1 %
Hematocrit: 39 % (ref 34.0–46.6)
Hemoglobin: 12.2 g/dL (ref 11.1–15.9)
Immature Grans (Abs): 0 10*3/uL (ref 0.0–0.1)
Immature Granulocytes: 0 %
Lymphocytes Absolute: 1.7 10*3/uL (ref 0.7–3.1)
Lymphs: 27 %
MCH: 26.3 pg — ABNORMAL LOW (ref 26.6–33.0)
MCHC: 31.3 g/dL — ABNORMAL LOW (ref 31.5–35.7)
MCV: 84 fL (ref 79–97)
Monocytes Absolute: 0.6 10*3/uL (ref 0.1–0.9)
Monocytes: 9 %
Neutrophils Absolute: 4 10*3/uL (ref 1.4–7.0)
Neutrophils: 63 %
Platelets: 218 10*3/uL (ref 150–450)
RBC: 4.63 x10E6/uL (ref 3.77–5.28)
RDW: 17.2 % — ABNORMAL HIGH (ref 11.7–15.4)
WBC: 6.5 10*3/uL (ref 3.4–10.8)

## 2022-07-30 LAB — VITAMIN B12: Vitamin B-12: 744 pg/mL (ref 232–1245)

## 2022-08-05 ENCOUNTER — Telehealth: Payer: Self-pay | Admitting: *Deleted

## 2022-08-05 NOTE — Telephone Encounter (Signed)
See telephone encounter dated 08/05/22.

## 2022-08-05 NOTE — Telephone Encounter (Signed)
Please pull chart ASAP

## 2022-08-05 NOTE — Telephone Encounter (Signed)
Patricia Bolk, MD 2 weeks ago    Surgery: CPT [phone] - Total Laparoscopic Hysterectomy with Salpingectomy and/or Oophorectomy,  cystoscopy   Diagnosis: N92.0 Menorrhagia with Regular Cycles,  anemia   Location: [facility]   Status: Outpatient with Overnight Bed   Time: 90 Minutes   Assistant: Conley Simmonds, MD, Evern Core   Urgency: At Patient's Convenience   Pre-Op Appointment: Completed   Post-Op Appointment(s): 1 Week, 4 Weeks   Time Out Of Work: 6 Weeks   The patient has struggled with severe anemia, she is currently not anemic (s/p iron transfusions). We need to coordinate this when she isn't severely anemic.

## 2022-08-05 NOTE — Telephone Encounter (Signed)
Spoke with patient. Reviewed surgery dates. Patient request to proceed with surgery on 10/21/22. Patient declines earlier dates offered.   Advised patient I will forward to business office for return call. I will return call once surgery date and time confirmed. Patient verbalizes understanding and is agreeable.   Surgery request sent.

## 2022-08-07 NOTE — Telephone Encounter (Signed)
Spoke with patient regarding surgery benefits. Patient acknowledges understanding of information presented. Patient is aware that benefits presented are professional benefits only. Patient is aware the hospital will call with facility benefits. See account note.  Routing to Jill Hamm, RN.  

## 2022-08-08 DIAGNOSIS — Z8719 Personal history of other diseases of the digestive system: Secondary | ICD-10-CM

## 2022-08-08 HISTORY — DX: Personal history of other diseases of the digestive system: Z87.19

## 2022-08-12 ENCOUNTER — Other Ambulatory Visit (INDEPENDENT_AMBULATORY_CARE_PROVIDER_SITE_OTHER): Payer: Self-pay | Admitting: Family Medicine

## 2022-08-12 ENCOUNTER — Ambulatory Visit (INDEPENDENT_AMBULATORY_CARE_PROVIDER_SITE_OTHER): Payer: No Typology Code available for payment source | Admitting: Family Medicine

## 2022-08-12 ENCOUNTER — Encounter (INDEPENDENT_AMBULATORY_CARE_PROVIDER_SITE_OTHER): Payer: Self-pay | Admitting: Family Medicine

## 2022-08-12 VITALS — BP 117/79 | HR 62 | Temp 97.9°F | Ht 62.0 in | Wt 193.0 lb

## 2022-08-12 DIAGNOSIS — R7303 Prediabetes: Secondary | ICD-10-CM | POA: Diagnosis not present

## 2022-08-12 DIAGNOSIS — E669 Obesity, unspecified: Secondary | ICD-10-CM | POA: Diagnosis not present

## 2022-08-12 DIAGNOSIS — Z6834 Body mass index (BMI) 34.0-34.9, adult: Secondary | ICD-10-CM

## 2022-08-12 DIAGNOSIS — F32A Depression, unspecified: Secondary | ICD-10-CM | POA: Insufficient documentation

## 2022-08-12 DIAGNOSIS — Z6835 Body mass index (BMI) 35.0-35.9, adult: Secondary | ICD-10-CM

## 2022-08-12 DIAGNOSIS — F3289 Other specified depressive episodes: Secondary | ICD-10-CM

## 2022-08-12 MED ORDER — WEGOVY 0.25 MG/0.5ML ~~LOC~~ SOAJ: 0.2500 mg | SUBCUTANEOUS | 0 refills | Status: DC

## 2022-08-12 MED ORDER — METFORMIN HCL 500 MG PO TABS: 500.0000 mg | ORAL_TABLET | Freq: Every day | ORAL | 0 refills | Status: DC

## 2022-08-14 NOTE — Telephone Encounter (Signed)
Spoke with patient. Surgery date request confirmed.  Advised surgery is scheduled for [facility] on 10/21/22 at 0730. Surgery instruction sheet and hospital brochure reviewed, printed copy will be mailed.  Patient verbalizes understanding and is agreeable.  Routing to provider. Encounter closed.   Cc: Kimalexis

## 2022-08-18 ENCOUNTER — Telehealth (INDEPENDENT_AMBULATORY_CARE_PROVIDER_SITE_OTHER): Payer: Self-pay | Admitting: Family Medicine

## 2022-08-18 ENCOUNTER — Encounter (INDEPENDENT_AMBULATORY_CARE_PROVIDER_SITE_OTHER): Payer: Self-pay

## 2022-08-18 NOTE — Telephone Encounter (Signed)
[name] - Prior authorization approved for [facility]. Effective: 08/14/2022 to 03/12/2023. Patient sent approval message via mychart.

## 2022-08-19 ENCOUNTER — Telehealth (INDEPENDENT_AMBULATORY_CARE_PROVIDER_SITE_OTHER): Payer: No Typology Code available for payment source | Admitting: Psychology

## 2022-08-19 DIAGNOSIS — F5081 Binge eating disorder: Secondary | ICD-10-CM

## 2022-08-19 DIAGNOSIS — F331 Major depressive disorder, recurrent, moderate: Secondary | ICD-10-CM | POA: Diagnosis not present

## 2022-08-19 NOTE — Progress Notes (Signed)
Chief Complaint:   OBESITY Patricia Lin is here to discuss her progress with her obesity treatment plan along with follow-up of her obesity related diagnoses. Patricia Lin is on the Category 2 Plan and states she is following her eating plan approximately 0% of the time. Patricia Lin states she is doing 0 minutes 0 times per week.  Today's visit was #: 5 Starting weight: 203 lbs Starting date: 09/08/2018 Today's weight: 193 Today's date: 08/12/2022 Total lbs lost to date: 10 Total lbs lost since last in-office visit: 1  Interim History: Patricia Lin did a lot of celebration eating in the last 2-3 weeks. She did well with portion control and she didn't feel deprived with smaller portions. She is interested in starting [facility] for increased hunger.   Subjective:   1. Prediabetes Patricia Lin is on metformin, and she is working on her diet and exercise. She still notes polyphagia.  2. Other depression, emotionally eating behaviors Patricia Lin is struggling with emotional eating behavior, and worse on her cycle.   Assessment/Plan:   1. Prediabetes We will refill metformin for 1 month, and Patricia Lin will start on her GLP-1.  - metFORMIN (GLUCOPHAGE) 500 MG tablet; Take 1 tablet (500 mg total) by mouth daily with breakfast.  Dispense: 30 tablet; Refill: 0  2. Other depression, emotionally eating behaviors Bupropion was offered to the patient today, and Patricia Lin will consider and we will discuss this at her next visit if she is interested.   3. Obesity, Current BMI 35.3 Patricia Lin is currently in the action stage of change. As such, her goal is to continue with weight loss efforts. She has agreed to the Category 2 Plan.   We discussed various medication options to help Patricia Lin with her weight loss efforts and we both agreed to start Wegovy 0.25 mg once weekly, with no refills.   - Semaglutide-Weight Management (WEGOVY) 0.25 MG/0.5ML SOAJ; Inject 0.25 mg into the skin once a week.  Dispense: 2 mL; Refill: 0  Behavioral  modification strategies: increasing lean protein intake.  Patricia Lin has agreed to follow-up with our clinic in 2 to 3 weeks. She was informed of the importance of frequent follow-up visits to maximize her success with intensive lifestyle modifications for her multiple health conditions.   Objective:   Blood pressure 117/79, pulse 62, temperature 97.9 F (36.6 C), height 5\' 2"  (1.575 m), weight 193 lb (87.5 kg), last menstrual period 07/11/2022, SpO2 100 %. Body mass index is 35.3 kg/m.  General: Cooperative, alert, well developed, in no acute distress. HEENT: Conjunctivae and lids unremarkable. Cardiovascular: Regular rhythm.  Lungs: Normal work of breathing. Neurologic: No focal deficits.   Lab Results  Component Value Date   CREATININE 0.90 05/07/2022   BUN 18 05/07/2022   NA 139 05/07/2022   K 3.7 05/07/2022   CL 108 05/07/2022   CO2 24 05/07/2022   Lab Results  Component Value Date   ALT 27 05/07/2022   AST 24 05/07/2022   ALKPHOS 80 05/07/2022   BILITOT 0.6 05/07/2022   Lab Results  Component Value Date   HGBA1C 5.5 05/02/2022   HGBA1C 5.2 09/08/2018   Lab Results  Component Value Date   INSULIN 15.1 06/11/2022   INSULIN 11.5 09/08/2018   Lab Results  Component Value Date   TSH 0.907 05/07/2022   Lab Results  Component Value Date   CHOL 184 05/02/2022   HDL 68 05/02/2022   LDLCALC 106 (H) 05/02/2022   TRIG 53 05/02/2022   CHOLHDL 2.7 05/02/2022  Lab Results  Component Value Date   VD25OH 22.2 (L) 06/11/2022   VD25OH 9.6 (L) 09/08/2018   Lab Results  Component Value Date   WBC 6.5 07/29/2022   HGB 12.2 07/29/2022   HCT 39.0 07/29/2022   MCV 84 07/29/2022   PLT 218 07/29/2022   Lab Results  Component Value Date   IRON 92 07/02/2022   TIBC 381 07/02/2022   FERRITIN 30 07/02/2022   Attestation Statements:   Reviewed by clinician on day of visit: allergies, medications, problem list, medical history, surgical history, family history, social  history, and previous encounter notes.  I, Burt Knack, am acting as transcriptionist for Quillian Quince, MD.  I have reviewed the above documentation for accuracy and completeness, and I agree with the above. -  Quillian Quince, MD

## 2022-08-19 NOTE — Progress Notes (Signed)
Office: [phone]  /  Fax: [phone]    Date: August 19, 2022    Appointment Start Time: 8:33am Duration: 24 minutes Provider: Lawerance Cruel, Psy.D. Type of Session: Individual Therapy  Location of Patient: Home (private location) Location of Provider: Provider's Home (private office) Type of Contact: Telepsychological Visit via MyChart Video Visit  Session Content: This provider called Patricia Lin at 8:32am as she did not present for today's appointment. She appeared to have forgotten about today's appointment, but indicated she could join. As such, today's appointment was initiated 3 minutes late. Patricia Lin is a 41 y.o. female presenting for a follow-up appointment to address the previously established treatment goal of increasing coping skills.Today's appointment was a telepsychological visit. Sabriyah provided verbal consent for today's telepsychological appointment and she is aware she is responsible for securing confidentiality on her end of the session. Prior to proceeding with today's appointment, Patricia Lin's physical location at the time of this appointment was obtained as well a phone number she could be reached at in the event of technical difficulties. Patricia Lin and this provider participated in today's telepsychological service.   This provider conducted a brief check-in. Patricia Lin shared about recent birthday celebrations for herself and various loved ones. This provider checked-in regarding Patricia Lin establishing care with a primary therapist. She noted, "I don't have the time." Further explored and processed. She provided verbal consent for this provider to place a referral with [facility]. Regarding eating habits, Patricia Lin reported implementing discussed strategies. During the celebrations, she stated she made better choices and engaged in portion control. Due to Patricia Lin discussing challenges with dinner and being busy, this provider engaged her in problem solving. She was  receptive to trying the following: doubling recipes; having a working plan for meals/snacks; having a frozen meal for dinner vs. Lunch on days she is busier than usual; and hard boiling eggs in bulk. Notably, she shared, "I don't binge eat anymore." She acknowledged some instances of emotional eating behaviors, adding she will eat 100 calorie snacks. Overall, Patricia Lin was receptive to today's appointment as evidenced by openness to sharing, responsiveness to feedback, and willingness to implement discussed strategies .  Mental Status Examination:  Appearance: neat Behavior: appropriate to circumstances Mood: neutral Affect: mood congruent Speech: WNL Eye Contact: appropriate Psychomotor Activity: WNL Gait: unable to assess Thought Process: linear, logical, and goal directed and denies suicidal, homicidal, and self-harm ideation, plan and intent  Thought Content/Perception: no hallucinations, delusions, bizarre thinking or behavior endorsed or observed Orientation: AAOx4 Memory/Concentration: memory, attention, language, and fund of knowledge intact  Insight: fair Judgment: fair  Interventions:  Conducted a brief chart review Conducted a risk assessment Provided empathic reflections and validation Reviewed content from the previous session Provided positive reinforcement Employed supportive psychotherapy interventions to facilitate reduced distress and to improve coping skills with identified stressors Engaged patient in problem solving Recommended longer-term therapeutic services   DSM-5 Diagnosis(es):  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 Patricia Lin has demonstrated progress in her goal as evidenced by increased awareness of hunger patterns and reduction in  binge eating behaviors. Patricia Lin also continues to demonstrate willingness to engage in learned skill(s).  Plan: The next appointment is scheduled for 09/08/2022 at 8:30am, which will be via MyChart Video Visit. The next session will focus on working towards the established treatment goal. A referral will  be placed with [facility].

## 2022-08-25 ENCOUNTER — Other Ambulatory Visit (INDEPENDENT_AMBULATORY_CARE_PROVIDER_SITE_OTHER): Payer: Self-pay | Admitting: Family Medicine

## 2022-08-25 DIAGNOSIS — R7303 Prediabetes: Secondary | ICD-10-CM

## 2022-08-30 ENCOUNTER — Encounter (HOSPITAL_COMMUNITY): Payer: Self-pay | Admitting: Emergency Medicine

## 2022-08-30 ENCOUNTER — Emergency Department (HOSPITAL_COMMUNITY)
Admission: EM | Admit: 2022-08-30 | Discharge: 2022-08-30 | Disposition: A | Discharge status: Home / Self Care | Payer: No Typology Code available for payment source | Attending: Emergency Medicine | Admitting: Emergency Medicine

## 2022-08-30 ENCOUNTER — Emergency Department (HOSPITAL_COMMUNITY): Payer: No Typology Code available for payment source

## 2022-08-30 DIAGNOSIS — Z91040 Latex allergy status: Secondary | ICD-10-CM | POA: Insufficient documentation

## 2022-08-30 DIAGNOSIS — K5732 Diverticulitis of large intestine without perforation or abscess without bleeding: Secondary | ICD-10-CM | POA: Insufficient documentation

## 2022-08-30 DIAGNOSIS — I10 Essential (primary) hypertension: Secondary | ICD-10-CM | POA: Insufficient documentation

## 2022-08-30 DIAGNOSIS — R1032 Left lower quadrant pain: Secondary | ICD-10-CM | POA: Diagnosis present

## 2022-08-30 LAB — COMPREHENSIVE METABOLIC PANEL
ALT: 27 U/L (ref 0–44)
AST: 20 U/L (ref 15–41)
Albumin: 4.4 g/dL (ref 3.5–5.0)
Alkaline Phosphatase: 69 U/L (ref 38–126)
Anion gap: 8 (ref 5–15)
BUN: 19 mg/dL (ref 6–20)
CO2: 21 mmol/L — ABNORMAL LOW (ref 22–32)
Calcium: 9.4 mg/dL (ref 8.9–10.3)
Chloride: 107 mmol/L (ref 98–111)
Creatinine, Ser: 0.62 mg/dL (ref 0.44–1.00)
GFR, Estimated: >60 mL/min (ref >60)
Glucose, Bld: 105 mg/dL — ABNORMAL HIGH (ref 70–99)
Potassium: 3.8 mmol/L (ref 3.5–5.1)
Sodium: 136 mmol/L (ref 135–145)
Total Bilirubin: 0.9 mg/dL (ref 0.3–1.2)
Total Protein: 8 g/dL (ref 6.5–8.1)

## 2022-08-30 LAB — URINALYSIS, ROUTINE W REFLEX MICROSCOPIC
Bacteria, UA: NONE SEEN
Bilirubin Urine: NEGATIVE
Glucose, UA: NEGATIVE mg/dL
Ketones, ur: NEGATIVE mg/dL
Leukocytes,Ua: NEGATIVE
Nitrite: NEGATIVE
Protein, ur: NEGATIVE mg/dL
Specific Gravity, Urine: 1.003 — ABNORMAL LOW (ref 1.005–1.030)
pH: 6 (ref 5.0–8.0)

## 2022-08-30 LAB — CBC
HCT: 40.3 % (ref 36.0–46.0)
Hemoglobin: 12.8 g/dL (ref 12.0–15.0)
MCH: 27.7 pg (ref 26.0–34.0)
MCHC: 31.8 g/dL (ref 30.0–36.0)
MCV: 87.2 fL (ref 80.0–100.0)
Platelets: 247 10*3/uL (ref 150–400)
RBC: 4.62 MIL/uL (ref 3.87–5.11)
RDW: 14.1 % (ref 11.5–15.5)
WBC: 8.9 10*3/uL (ref 4.0–10.5)
nRBC: 0 % (ref 0.0–0.2)

## 2022-08-30 LAB — I-STAT BETA HCG BLOOD, ED (MC, WL, AP ONLY): I-stat hCG, quantitative: <5 m[IU]/mL (ref <5)

## 2022-08-30 LAB — LIPASE, BLOOD: Lipase: 33 U/L (ref 11–51)

## 2022-08-30 MED ORDER — DICYCLOMINE HCL 10 MG PO CAPS
10.0000 mg | ORAL_CAPSULE | Freq: Once | ORAL | Status: AC
  Administered 2022-08-30: 10 mg via ORAL
  Filled 2022-08-30: qty 1

## 2022-08-30 MED ORDER — AMOXICILLIN-POT CLAVULANATE 875-125 MG PO TABS: 1.0000 | ORAL_TABLET | Freq: Two times a day (BID) | ORAL | 0 refills | Status: AC

## 2022-08-30 MED ORDER — AMOXICILLIN-POT CLAVULANATE 875-125 MG PO TABS
1.0000 | ORAL_TABLET | Freq: Once | ORAL | Status: AC
  Administered 2022-08-30: 1 via ORAL
  Filled 2022-08-30: qty 1

## 2022-08-30 MED ORDER — MORPHINE SULFATE (PF) 2 MG/ML IV SOLN
2.0000 mg | Freq: Once | INTRAVENOUS | Status: AC
  Administered 2022-08-30: 2 mg via INTRAVENOUS
  Filled 2022-08-30: qty 1

## 2022-08-30 MED ORDER — SODIUM CHLORIDE 0.9 % IV BOLUS
1000.0000 mL | Freq: Once | INTRAVENOUS | Status: AC
  Administered 2022-08-30: 1000 mL via INTRAVENOUS

## 2022-08-30 MED ORDER — IOHEXOL 300 MG/ML  SOLN
100.0000 mL | Freq: Once | INTRAMUSCULAR | Status: AC | PRN
  Administered 2022-08-30: 100 mL via INTRAVENOUS

## 2022-08-30 NOTE — ED Triage Notes (Signed)
Pt reports LLQ abd pain starting yesterday. States that it is tender to touch. Worse with walking and breathing. Denies N/V/D. Takes metformin. Has her original organs.

## 2022-08-30 NOTE — ED Provider Notes (Signed)
[facility]-EMERGENCY DEPT Provider Note   CSN: [id number] Arrival date & time: 08/30/22  1911     History  Chief Complaint  Patient presents with   Abdominal Pain    Patricia Lin is a 41 y.o. female. With past medical history of obesity, prediabetes, HLD, IDA, HTN who presents to the emergency department with abdominal pain.  States she began having abdominal pain yesterday evening around 830.  States that she was out at dinner when she began having left lower quadrant crampiness.  She states that she had not eaten when the symptoms started.  She describes having 3 episodes of bloody diarrhea yesterday evening.  States that symptoms have persisted today and she is having increased abdominal pain.  She describes having episodes of chills today, mild nausea without vomiting.  She states that the abdominal pain is worse when she is walking or taking deep breaths and improves when she is sitting still.  She denies having any melena, hematochezia, hematemesis, objective fever, dysuria, vaginal discharge.   Abdominal Pain Associated symptoms: chills, diarrhea and nausea   Associated symptoms: no constipation, no dysuria, no fever, no vaginal discharge and no vomiting        Home Medications Prior to Admission medications   Medication Sig Start Date End Date Taking? Authorizing Provider  amoxicillin-clavulanate (AUGMENTIN) 875-125 MG tablet Take 1 tablet by mouth every 12 (twelve) hours for 7 days. 08/30/22 09/06/22 Yes Cristopher Peru, PA-C  ferrous sulfate 325 (65 FE) MG tablet Take 1 tablet (325 mg total) by mouth 3 (three) times daily with meals. 07/08/22   Si Gaul, MD  metFORMIN (GLUCOPHAGE) 500 MG tablet Take 1 tablet (500 mg total) by mouth daily with breakfast. 08/12/22   Quillian Quince D, MD  Semaglutide-Weight Management (WEGOVY) 0.25 MG/0.5ML SOAJ Inject 0.25 mg into the skin once a week. 08/12/22   Quillian Quince D, MD  tranexamic acid (LYSTEDA) 650 MG  TABS tablet Take 2 tablets (1,300 mg total) by mouth 3 (three) times daily. 05/28/22   Romualdo Bolk, MD      Allergies    Latex    Review of Systems   Review of Systems  Constitutional:  Positive for appetite change and chills. Negative for fever.  Gastrointestinal:  Positive for abdominal pain, diarrhea and nausea. Negative for blood in stool, constipation and vomiting.  Genitourinary:  Negative for dysuria and vaginal discharge.  All other systems reviewed and are negative.   Physical Exam Updated Vital Signs BP (!) 176/105   Pulse 79   Temp 98 F (36.7 C) (Oral)   Resp 17   SpO2 99%  Physical Exam Vitals and nursing note reviewed.  Constitutional:      General: She is not in acute distress.    Appearance: Normal appearance. She is well-developed. She is not ill-appearing or toxic-appearing.  HENT:     Head: Normocephalic and atraumatic.     Mouth/Throat:     Mouth: Mucous membranes are moist.  Eyes:     General: No scleral icterus.    Extraocular Movements: Extraocular movements intact.     Pupils: Pupils are equal, round, and reactive to light.  Cardiovascular:     Rate and Rhythm: Normal rate and regular rhythm.     Heart sounds: Normal heart sounds. No murmur heard. Pulmonary:     Effort: Pulmonary effort is normal. No respiratory distress.     Breath sounds: Normal breath sounds.  Abdominal:     General:  Abdomen is protuberant. Bowel sounds are normal. There is no distension.     Palpations: Abdomen is soft.     Tenderness: There is abdominal tenderness in the left lower quadrant. There is no guarding or rebound. Negative signs include Murphy's sign and McBurney's sign.  Musculoskeletal:        General: Normal range of motion.     Cervical back: Neck supple.  Skin:    General: Skin is warm and dry.     Capillary Refill: Capillary refill takes less than 2 seconds.  Neurological:     General: No focal deficit present.     Mental Status: She is alert  and oriented to person, place, and time. Mental status is at baseline.  Psychiatric:        Mood and Affect: Mood normal.        Behavior: Behavior normal.        Thought Content: Thought content normal.        Judgment: Judgment normal.    ED Results / Procedures / Treatments   Labs (all labs ordered are listed, but only abnormal results are displayed) Labs Reviewed  COMPREHENSIVE METABOLIC PANEL - Abnormal; Notable for the following components:      Result Value   CO2 21 (*)    Glucose, Bld 105 (*)    All other components within normal limits  URINALYSIS, ROUTINE W REFLEX MICROSCOPIC - Abnormal; Notable for the following components:   Color, Urine COLORLESS (*)    Specific Gravity, Urine 1.003 (*)    Hgb urine dipstick SMALL (*)    All other components within normal limits  LIPASE, BLOOD  CBC  I-STAT BETA HCG BLOOD, ED ([facility], [facility], [facility] ONLY)    EKG None  Radiology CT Abdomen Pelvis W Contrast  Result Date: 08/30/2022 CLINICAL DATA:  Left lower quadrant pain onset today. EXAM: CT ABDOMEN AND PELVIS WITH CONTRAST TECHNIQUE: Multidetector CT imaging of the abdomen and pelvis was performed using the standard protocol following bolus administration of intravenous contrast. RADIATION DOSE REDUCTION: This exam was performed according to the departmental dose-optimization program which includes automated exposure control, adjustment of the mA and/or kV according to patient size and/or use of iterative reconstruction technique. CONTRAST:  142mL OMNIPAQUE IOHEXOL 300 MG/ML  SOLN COMPARISON:  None Available. FINDINGS: Lower chest: No acute abnormality. Hepatobiliary: The liver is 19 cm in length with homogeneous enhancement. The gallbladder and bile ducts are unremarkable. Spleen: The spleen is mildly prominent, 13.3 cm AP, with homogeneous enhancement. Pancreas: No focal abnormality. Adrenals/Urinary Tract: There is no adrenal mass. There is homogeneous bilateral renal cortical enhancement. There  is no urinary stone or obstruction. The bladder is unremarkable for the degree of distention. Stomach/Bowel: There is a tiny hiatal hernia. The gastric wall and unopacified small bowel are unremarkable. The appendix is normal. There is diffuse colonic diverticulosis. Multiple there is an 8 cm segment of mid to distal descending colon with moderate wall thickening, surrounding stranding and mild reactive fluid extending along the left paracolic gutter, findings consistent with acute diverticulitis. No diverticular abscess is seen or free air. Vascular/Lymphatic: No significant vascular findings are present. No enlarged abdominal or pelvic lymph nodes. Reproductive: The uterus is anteverted, mildly enlarged and heterogeneous most likely due to a fibroid morphology. The ovaries are not enlarged. There are multiple phleboliths along the gonadal veins. Other: As above there is mild nonlocalizing reactive fluid extending along the mid to distal left paracolic gutter, trace amount in the pelvic cul-de-sac.  There is no free air, free hemorrhage or abscess. There is mild umbilical rectus diastasis and small umbilical fat hernia. There is no incarcerated hernia. Musculoskeletal: Slight lumbar levoscoliosis. Bone island anterior right hemisacrum. No acute or significant osseous findings. IMPRESSION: 1. Diffuse colonic diverticulosis, with moderate wall thickening and stranding involving the mid to distal descending colon in keeping with acute diverticulitis. There is nonlocalizing reactive fluid extending along the distal left paracolic gutter into the pelvis but no evidence of abscess or free air. 2. Follow-up colonoscopy recommended after treatment to exclude underlying lesion. 3. Mildly prominent liver and spleen. 4. Mildly prominent fibroid uterus. 5. Umbilical fat hernia. Electronically Signed   By: Almira Bar M.D.   On: 08/30/2022 21:37    Procedures Procedures   Medications Ordered in ED Medications   amoxicillin-clavulanate (AUGMENTIN) 875-125 MG per tablet 1 tablet (has no administration in time range)  sodium chloride 0.9 % bolus 1,000 mL (1,000 mLs Intravenous New Bag/Given 08/30/22 2033)  dicyclomine (BENTYL) capsule 10 mg (10 mg Oral Given 08/30/22 2031)  morphine (PF) 2 MG/ML injection 2 mg (2 mg Intravenous Given 08/30/22 2034)  iohexol (OMNIPAQUE) 300 MG/ML solution 100 mL (100 mLs Intravenous Contrast Given 08/30/22 2104)    ED Course/ Medical Decision Making/ A&P                           Medical Decision Making Amount and/or Complexity of Data Reviewed Labs: ordered. Radiology: ordered.  Risk Prescription drug management.  This patient presents to the ED with chief complaint(s) of abdominal pain with pertinent past medical history of hypertension, hyperlipidemia, prediabetes which further complicates the presenting complaint. The complaint involves an extensive differential diagnosis and also carries with it a high risk of complications and morbidity.    The differential diagnosis includes Acute hepatobiliary disease, pancreatitis, appendicitis, PUD, gastritis, SBO, diverticulitis, colitis, viral gastroenteritis, Crohn's, UC, vascular catastrophe, UTI, pyelonephritis, renal stone, obstructed stone, infected stone, ovarian torsion, ectopic pregnancy, TOA, PID, STD, etc.    Additional history obtained: Additional history obtained from spouse Records reviewed Care Everywhere/External Records and Primary Care Documents  ED Course and Reassessment: 41 year old female who presents to the emergency department for abdominal pain.  Physical exam with LLQ abdominal pain on palpation. No rebound tenderness. No peritonitic findings. She is afebrile and overall well-appearance, non-septic and non-toxic in appearance.  Abdominal labs ordered  Given IVF, bentyl and 2mg  morphine.  Ordered CT abdomen pelvis over concern for possible diverticulitis.   Labs with UA without UTI so doubt  this as a source or pyelo, renal stone, infected stone, obstructed stone. Lipase is negative, doubt pancreatitis. No electrolyte dysfunction. No transaminitis and no RUQ abdominal tenderness. Symptoms inconsistent with acute hepatobiliary disease. No vaginal discharge concerning for PID or TOA. Not pregnant so doubt ectopic. Symptoms inconsistent with torsion.  CT shows diverticulitis with some stranding and small amount of fluid. Given first dose Augmentin in ED. No evidence of SBO, vascular catastrophe, perforation or abscess.  She is feeling moderately improved here after treatment.  Will prescribe her 7 days of Augmentin for home. Instructed to drink plenty of fluids. Given return precautions for worsening abdominal pain and fever. She verbalizes understanding.  Will also refer her to GI for colonoscopy after infection resides. She is in agreement with this plan. Do not feel she needs inpatient admission for IV abx given lack of complicated diverticulitis and non-septic presentation.  Safe for discharge.  Independent labs interpretation:  The following labs were independently interpreted: Not pregnant, no leukocytosis, negative lipase, no electrolyte dysfunction, AKI or transaminitis  Independent visualization of imaging: - I independently visualized the following imaging with scope of interpretation limited to determining acute life threatening conditions related to emergency care: CT abdomen pelvis, which revealed  IMPRESSION:  1. Diffuse colonic diverticulosis, with moderate wall thickening and  stranding involving the mid to distal descending colon in keeping  with acute diverticulitis. There is nonlocalizing reactive fluid  extending along the distal left paracolic gutter into the pelvis but  no evidence of abscess or free air.   Consultation: - Consulted or discussed management/test interpretation w/ external professional: not indicated   Consideration for admission or further  workup: no indication for admission after work up  Social Determinants of health: none identified  Final Clinical Impression(s) / ED Diagnoses Final diagnoses:  Diverticulitis of colon    Rx / DC Orders ED Discharge Orders          Ordered    amoxicillin-clavulanate (AUGMENTIN) 875-125 MG tablet  Every 12 hours        08/30/22 2159    Ambulatory referral to Gastroenterology       Comments: Diverticulitis   08/30/22 2203              Cristopher Peru, PA-C 08/30/22 2243    Terrilee Files, MD 08/31/22 [phone]

## 2022-08-30 NOTE — Discharge Instructions (Addendum)
You were seen in the emergency department today for abdominal pain. You have diverticulitis. This is an inflammation of your colon. We have given you a dose of antibiotics here in the emergency department, and you will continue to take antibiotics twice a day for the next 7 days. I am referring you to a gastroenterologist for you to have follow-up on this condition. Please return to the emergency department if you have worsening abdominal pain with fever.

## 2022-09-01 ENCOUNTER — Encounter (INDEPENDENT_AMBULATORY_CARE_PROVIDER_SITE_OTHER): Payer: Self-pay | Admitting: Family Medicine

## 2022-09-01 ENCOUNTER — Ambulatory Visit (INDEPENDENT_AMBULATORY_CARE_PROVIDER_SITE_OTHER): Payer: No Typology Code available for payment source | Admitting: Family Medicine

## 2022-09-01 ENCOUNTER — Encounter: Payer: Self-pay | Admitting: Nurse Practitioner

## 2022-09-01 VITALS — BP 131/85 | HR 77 | Temp 98.3°F | Ht 62.0 in | Wt 192.0 lb

## 2022-09-01 DIAGNOSIS — K5792 Diverticulitis of intestine, part unspecified, without perforation or abscess without bleeding: Secondary | ICD-10-CM | POA: Diagnosis not present

## 2022-09-01 DIAGNOSIS — Z6835 Body mass index (BMI) 35.0-35.9, adult: Secondary | ICD-10-CM

## 2022-09-01 DIAGNOSIS — E669 Obesity, unspecified: Secondary | ICD-10-CM

## 2022-09-01 DIAGNOSIS — R7303 Prediabetes: Secondary | ICD-10-CM | POA: Diagnosis not present

## 2022-09-01 DIAGNOSIS — Z9189 Other specified personal risk factors, not elsewhere classified: Secondary | ICD-10-CM

## 2022-09-01 MED ORDER — METFORMIN HCL 500 MG PO TABS: 500.0000 mg | ORAL_TABLET | Freq: Every day | ORAL | 0 refills | Status: DC

## 2022-09-04 NOTE — Progress Notes (Signed)
Chief Complaint:   OBESITY Patricia Lin is here to discuss her progress with her obesity treatment plan along with follow-up of her obesity related diagnoses. Patricia Lin is on the Category 2 Plan and states she is following her eating plan approximately 50% of the time. Patricia Lin states she is not currently exercising.  Today's visit was #: 6 Starting weight: 203 lbs Starting date: 09/08/2018 Today's weight: 192 lbs Today's date: 09/01/2022 Total lbs lost to date: 11 Total lbs lost since last in-office visit: 1  Interim History: Pt has been sick the past week with GI upset and abdominal pain due to diverticulitis. She has had very little intake lately. She hasn't even eaten today ant it's 2:50 PM already. Pt denies issues with meal plan.  Subjective:   1. Prediabetes Patricia Lin is unable to get [facility]. She stopped Metformin due to GI upset but was found to have diverticulitis. Pt would like to restart.  2. Diverticulitis CT scan done 08/2022. Pt was seen in the ER and notes/labs/imaging were reviewed with pt.  3. At risk for malnutrition Patricia Lin is at risk for malnutrition due to poor by mouth intake.  Assessment/Plan:   1. Prediabetes This is a new diagnosis for Patricia Lin - I counseled patient on pathophysiology of the disease process of I.R. and Pre-DM.   - Stressed importance of dietary and lifestyle modifications to result in weight loss as first line txmnt - In addition, we discussed the risks and benefits of various medication options which can help [place] in the management of this disease process as well as with weight loss.  Will consider starting one of these meds in future as we will focus on prudent nutritional plan at this time.  - Continue to decrease simple carbs/ sugars; increase fiber and proteins -> follow her meal plan.   - Explained role of simple carbs and insulin levels on hunger and cravings - Handouts provided at pt's request after education provided.  All  concerns/questions addressed.   - Anticipatory guidance given.   - Patricia Lin will continue to work on weight loss, exercise, via their meal plan we devised to help decrease the risk of progressing to diabetes.  - We will recheck A1c and fasting insulin level in approximately 3 months from last check, or as deemed appropriate.  Unable to start [facility].  Restart Metformin 1/2 tab daily.  Refill and Restart- metFORMIN (GLUCOPHAGE) 500 MG tablet; Take 1 tablet (500 mg total) by mouth daily with breakfast.  Dispense: 30 tablet; Refill: 0  2. Diverticulitis Patricia Lin is seeing GI in the near future. She was prescribed Augmentin by ER provider and symptoms are slowly improving.  3. At risk for malnutrition Patricia Lin was given extensive malnutrition prevention education and counseling today of more than 9 minutes.  Counseled her that malnutrition refers to inappropriate nutrients or not the right balance of nutrients for optimal health.  Discussed with Patricia Lin that it is absolutely possible to be malnourished but yet obese.  Risk factors, including but not limited to, inappropriate dietary choices, difficulty with obtaining food due to physical or financial limitations, and various physical and mental health conditions were reviewed with Patricia Lin.   4. Obesity, Current BMI 35.2 Patricia Lin is currently in the action stage of change. As such, her goal is to continue with weight loss efforts. She has agreed to the Category 2 Plan.   Handouts: Recipe Packet II and Store Bought Seasonings Discussion on recipes and meal planning discussed  with pt.  Exercise goals: All adults should avoid inactivity. Some physical activity is better than none, and adults who participate in any amount of physical activity gain some health benefits.  Behavioral modification strategies: increasing lean protein intake, decreasing simple carbohydrates, and planning for success.  Patricia Lin has agreed to follow-up with our clinic in 3  weeks. She was informed of the importance of frequent follow-up visits to maximize her success with intensive lifestyle modifications for her multiple health conditions.   Objective:   Blood pressure 131/85, pulse 77, temperature 98.3 F (36.8 C), height 5\' 2"  (1.575 m), weight 192 lb (87.1 kg), SpO2 98 %. Body mass index is 35.12 kg/m.  General: Cooperative, alert, well developed, in no acute distress. HEENT: Conjunctivae and lids unremarkable. Cardiovascular: Regular rhythm.  Lungs: Normal work of breathing. Neurologic: No focal deficits.   Lab Results  Component Value Date   CREATININE 0.62 08/30/2022   BUN 19 08/30/2022   NA 136 08/30/2022   K 3.8 08/30/2022   CL 107 08/30/2022   CO2 21 (L) 08/30/2022   Lab Results  Component Value Date   ALT 27 08/30/2022   AST 20 08/30/2022   ALKPHOS 69 08/30/2022   BILITOT 0.9 08/30/2022   Lab Results  Component Value Date   HGBA1C 5.5 05/02/2022   HGBA1C 5.2 09/08/2018   Lab Results  Component Value Date   INSULIN 15.1 06/11/2022   INSULIN 11.5 09/08/2018   Lab Results  Component Value Date   TSH 0.907 05/07/2022   Lab Results  Component Value Date   CHOL 184 05/02/2022   HDL 68 05/02/2022   LDLCALC 106 (H) 05/02/2022   TRIG 53 05/02/2022   CHOLHDL 2.7 05/02/2022   Lab Results  Component Value Date   VD25OH 22.2 (L) 06/11/2022   VD25OH 9.6 (L) 09/08/2018   Lab Results  Component Value Date   WBC 8.9 08/30/2022   HGB 12.8 08/30/2022   HCT 40.3 08/30/2022   MCV 87.2 08/30/2022   PLT 247 08/30/2022   Lab Results  Component Value Date   IRON 92 07/02/2022   TIBC 381 07/02/2022   FERRITIN 30 07/02/2022    Attestation Statements:   Reviewed by clinician on day of visit: allergies, medications, problem list, medical history, surgical history, family history, social history, and previous encounter notes.  I, Kathlene November, BS, CMA, am acting as transcriptionist for [employer], DO.  I have reviewed  the above documentation for accuracy and completeness, and I agree with the above. -   I have reviewed the above documentation for accuracy and completeness, and I agree with the above. Marjory Sneddon, D.O.  The [place] was signed into law in 2016 which includes the topic of electronic health records.  This provides immediate access to information in MyChart.  This includes consultation notes, operative notes, office notes, lab results and pathology reports.  If you have any questions about what you read please let us know at your next visit so we can discuss your concerns and take corrective action if need be.  We are right here with you.

## 2022-09-08 ENCOUNTER — Telehealth (INDEPENDENT_AMBULATORY_CARE_PROVIDER_SITE_OTHER): Payer: No Typology Code available for payment source | Admitting: Psychology

## 2022-09-08 DIAGNOSIS — F5081 Binge eating disorder: Secondary | ICD-10-CM | POA: Diagnosis not present

## 2022-09-08 DIAGNOSIS — F331 Major depressive disorder, recurrent, moderate: Secondary | ICD-10-CM

## 2022-09-08 NOTE — Progress Notes (Signed)
Office: [phone]  /  Fax: [phone]    Date: September 08, 2022    Appointment Start Time: 8:34am Duration: 25 minutes Provider: Glennie Isle, Psy.D. Type of Session: Individual Therapy  Location of Patient: Home (private location) Location of Provider: Provider's Home (private office) Type of Contact: Telepsychological Visit via MyChart Video Visit  Session Content:This provider called Patricia Lin at 8:33am as she did not present for today's appointment. She stated she would join. As such, today's appointment was initiated 4 minutes late. Patricia Lin is a 40 y.o. female presenting for a follow-up appointment to address the previously established treatment goal of increasing coping skills.Today's appointment was a telepsychological visit. Patricia Lin provided verbal consent for today's telepsychological appointment and she is aware she is responsible for securing confidentiality on her end of the session. Prior to proceeding with today's appointment, Patricia Lin's physical location at the time of this appointment was obtained as well a phone number she could be reached at in the event of technical difficulties. Patricia Lin and this provider participated in today's telepsychological service.   This provider conducted a brief check-in. Patricia Lin reported, "I've been laid off." She also discussed experiencing symptoms due to diverticulitis. She acknowledged engagement in emotional eating behaviors after she learned she was laid off. Patricia Lin discussed other employment opportunities, noting she has options and is in the process of deciding. Notably, she indicated she did not receive paperwork from [employer] after speaking with them via telephone, and expressed concern about insurance. It was recommended she contact them; she agreed. Patricia Lin of today's appointment focused on eating habits. Patricia Lin explained while she has engaged in emotional eating behaviors, it has not escalated to a binge as she reminds  herself it will not solve any current stressors and she is motivated to lose weight. Patricia Lin was engaged in problem solving to develop a plan to help cope with urges/cravings involving activities to relax, activities to distract, comforting places, people to call and connect with, and activities that help soothe senses. She was observed writing the plan. Overall, Patricia Lin was receptive to today's appointment as evidenced by openness to sharing, responsiveness to feedback, and willingness to implement discussed strategies .  Mental Status Examination:  Appearance: neat Behavior: appropriate to circumstances Mood: neutral Affect: mood congruent Speech: WNL Eye Contact: appropriate Psychomotor Activity: WNL Gait: unable to assess Thought Process: linear, logical, and goal directed and denies suicidal, homicidal, and self-harm ideation, plan and intent  Thought Content/Perception: no hallucinations, delusions, bizarre thinking or behavior endorsed or observed Orientation: AAOx4 Memory/Concentration: memory, attention, language, and fund of knowledge intact  Insight: fair Judgment: fair  Interventions:  Conducted a brief chart review Conducted a risk assessment Provided empathic reflections and validation Employed supportive psychotherapy interventions to facilitate reduced distress and to improve coping skills with identified stressors Engaged patient in problem solving  DSM-5 Diagnosis(es):  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 Patricia Lin has demonstrated progress in her goal as evidenced by increased awareness of hunger patterns, increased awareness of triggers for emotional eating behaviors, and reduction in emotional eating behaviors . Patricia Lin also continues to  demonstrate willingness to engage in learned skill(s).  Plan: The next appointment is scheduled for 09/22/2022 at 8:30am, which will be via MyChart Video Visit. The next session will focus on working towards the established treatment goal. Patricia Lin will follow-up with  [facility].

## 2022-09-15 ENCOUNTER — Other Ambulatory Visit (INDEPENDENT_AMBULATORY_CARE_PROVIDER_SITE_OTHER): Payer: Self-pay | Admitting: Family Medicine

## 2022-09-15 ENCOUNTER — Other Ambulatory Visit: Payer: Self-pay | Admitting: Obstetrics and Gynecology

## 2022-09-15 DIAGNOSIS — N920 Excessive and frequent menstruation with regular cycle: Secondary | ICD-10-CM

## 2022-09-15 DIAGNOSIS — E559 Vitamin D deficiency, unspecified: Secondary | ICD-10-CM

## 2022-09-22 ENCOUNTER — Telehealth (INDEPENDENT_AMBULATORY_CARE_PROVIDER_SITE_OTHER): Payer: No Typology Code available for payment source | Admitting: Psychology

## 2022-09-22 ENCOUNTER — Ambulatory Visit: Payer: No Typology Code available for payment source | Admitting: Nurse Practitioner

## 2022-09-22 ENCOUNTER — Encounter: Payer: Self-pay | Admitting: Nurse Practitioner

## 2022-09-22 VITALS — BP 138/72 | HR 72 | Ht 62.0 in | Wt 197.2 lb

## 2022-09-22 DIAGNOSIS — F331 Major depressive disorder, recurrent, moderate: Secondary | ICD-10-CM

## 2022-09-22 DIAGNOSIS — K5792 Diverticulitis of intestine, part unspecified, without perforation or abscess without bleeding: Secondary | ICD-10-CM | POA: Diagnosis not present

## 2022-09-22 DIAGNOSIS — F5081 Binge eating disorder: Secondary | ICD-10-CM | POA: Diagnosis not present

## 2022-09-22 DIAGNOSIS — Z800 Family history of malignant neoplasm of digestive organs: Secondary | ICD-10-CM | POA: Diagnosis not present

## 2022-09-22 MED ORDER — NA SULFATE-K SULFATE-MG SULF 17.5-3.13-1.6 GM/177ML PO SOLN: 1.0000 | Freq: Once | ORAL | 0 refills | Status: AC

## 2022-09-22 NOTE — Progress Notes (Signed)
Office: [phone]  /  Fax: [phone]    Date: September 22, 2022    Appointment Start Time: 8:30am Duration: 31 minutes Provider: Glennie Isle, Psy.D. Type of Session: Individual Therapy  Location of Patient: Home (private location) Location of Provider: Provider's Home (private office) Type of Contact: Telepsychological Visit via MyChart Video Visit  Session Content: Patricia Lin is a 41 y.o. female presenting for a follow-up appointment to address the previously established treatment goal of increasing coping skills.Today's appointment was a telepsychological visit. [name] provided verbal consent for today's telepsychological appointment and she is aware she is responsible for securing confidentiality on her end of the session. Prior to proceeding with today's appointment, [name]'s physical location at the time of this appointment was obtained as well a phone number she could be reached at in the event of technical difficulties. [name] and this provider participated in today's telepsychological service.   This provider conducted a brief check-in. [name] shared she is tired and reflected on recent events. She is continuing to look for employment, while she finishes her two months with her current employment. [name] acknowledged she started to engage in emotional eating behaviors again, but explained it did not reach the severity of binge eating and disclosed two instances. Further explored and processed. She acknowledged stress about losing her job triggered the emotional eating behaviors. This provider again discussed longer-term traditional therapeutic services and reviewed this provider's role with the clinic. She noted, "I'm putting that [traditional therapeutic services] on hold" due to ongoing medical appointments and her insurance ending in two months. Moreover, psychoeducation provided regarding self-compassion to assist with coping. [name] was engaged in a self-compassion exercise to help  with eating-related challenges and other ongoing stressors. She was encouraged to regularly ask herself, "What do I need right now?" Furthermore, [name] did not endorse experiencing suicidal, self-injurious, and homicidal ideation, plan, or intent since the last appointment with this provider and described future plans that include ongoing efforts to improve her well-being and eating habits. Overall, [name] was receptive to today's appointment as evidenced by openness to sharing, responsiveness to feedback, and willingness to work toward increasing self-compassion.  Mental Status Examination:  Appearance: neat Behavior: appropriate to circumstances Mood: neutral Affect: mood congruent Speech: WNL Eye Contact: appropriate Psychomotor Activity: WNL Gait: unable to assess Thought Process: linear, logical, and goal directed and no evidence or endorsement of suicidal, homicidal, and self-harm ideation, plan and intent  Thought Content/Perception: no hallucinations, delusions, bizarre thinking or behavior endorsed or observed Orientation: AAOx4 Memory/Concentration: memory, attention, language, and fund of knowledge intact  Insight: fair Judgment: fair  Interventions:  Conducted a brief chart review Provided empathic reflections and validation Employed supportive psychotherapy interventions to facilitate reduced distress and to improve coping skills with identified stressors Recommended/discussed options for longer-term therapeutic services Psychoeducation provided regarding self-compassion Engaged pt in a self-compassion exercise  DSM-5 Diagnosis(es):  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 [name] has demonstrated progress in her goal as evidenced by increased  awareness of hunger patterns, increased awareness of triggers for emotional eating behaviors, and reduction in binge eating behaviors. [name] also continues to demonstrate willingness to engage in learned skill(s).  Plan: The next appointment is scheduled for 10/06/2022 at 8:30am, which will be via MyChart Video Visit. The next session will focus on working towards the established treatment goal.

## 2022-09-22 NOTE — Patient Instructions (Addendum)
You have been scheduled for a colonoscopy. Please follow written instructions given to you at your visit today.  Please pick up your prep supplies at the pharmacy within the next 1-3 days. If you use inhalers (even only as needed), please bring them with you on the day of your procedure.   Miralax- daily at bedtime as needed to avoid constipation  Contact our office if your abdominal pain recurs.   Due to recent changes in healthcare laws, you may see the results of your imaging and laboratory studies on MyChart before your provider has had a chance to review them.  We understand that in some cases there may be results that are confusing or concerning to you. Not all laboratory results come back in the same time frame and the provider may be waiting for multiple results in order to interpret others.  Please give [place] 48 hours in order for your provider to thoroughly review all the results before contacting the office for clarification of your results.    It was a pleasure to see you today!  Thank you for trusting me with your gastrointestinal care!

## 2022-09-22 NOTE — Progress Notes (Signed)
09/22/2022 Patricia Lin [id number] 1981-10-01   CHIEF COMPLAINT: Diverticulitis   HISTORY OF PRESENT ILLNESS: Patricia Lin is a 41 year old female with a past medical history of anxiety, depression, anemia, menorrhagia and hypertension. S/P leep procedure, D & C 2006, tubal ligation, finger surgery in 2001 and left foot surgery.  She presents to our office today as referred by Ruta Hinds, PA-C for further evaluation regarding diverticulitis.  She developed LLQ pain for 3 to 4 days which progressively worsened therefore she presented to the ED 08/30/2022 for further evaluation.  Labs in the ED showed a WBC count of 8.9.  Hemoglobin 12.8.  Normal LFTs.  CTAP 08/30/2022 showed an 8 cm segment of the mid to distal descending colon with moderate wall thickening and surrounding stranding and mild reactive fluid extending along the paracolic gutter consistent with acute diverticulitis without evidence of abscess or perforation.  A mildly prominent liver and spleen were noted.  She was prescribed Augmentin 875 mg 1 p.o. twice daily for 10 days.  Her LLQ significantly improved by day 7 on Augmentin then completely abated after she completed the full course.  She denies having any further lower abdominal pain.  She reported eating popcorn on the same day her abdominal pain started. She thinks she had diverticulitis 2 years ago with similar LLQ pain which abated without treatment within 2 days.  No history of constipation.  No rectal bleeding or black stools. She has a history of menorrhagia and she is scheduled for a laparoscopic hysterectomy with bilateral salpingo-oophorectomy with Dr. Gertie Exon on 10/21/2022.  Her mother was diagnosed with colon cancer recently at the age of 42.      Latest Ref Rng & Units 08/30/2022    7:22 PM 07/29/2022    2:45 PM 07/02/2022    3:20 PM  CBC  WBC 4.0 - 10.5 K/uL 8.9  6.5  5.1   Hemoglobin 12.0 - 15.0 g/dL 65.0  35.4  65.6   Hematocrit 36.0 - 46.0 % 40.3  39.0   39.7   Platelets 150 - 400 K/uL 247  218  349        Latest Ref Rng & Units 08/30/2022    7:22 PM 05/07/2022    2:03 PM 05/05/2022    1:30 PM  CMP  Glucose 70 - 99 mg/dL 812  751  700   BUN 6 - 20 mg/dL 19  18  12    Creatinine 0.44 - 1.00 mg/dL  1.74  9.44   Sodium 135 - 145 mmol/L 136  139  139   Potassium 3.5 - 5.1 mmol/L 3.8  3.7  3.9   Chloride 98 - 111 mmol/L 107  108  111   CO2 22 - 32 mmol/L 21  24  24    Calcium 8.9 - 10.3 mg/dL 9.4  9.6  9.2   Total Protein 6.5 - 8.1 g/dL 8.0  8.3  7.1   Total Bilirubin 0.3 - 1.2 mg/dL 0.9  0.6  0.4   Alkaline Phos 38 - 126 U/L 69  80  74   AST 15 - 41 U/L 20  24  29    ALT 0 - 44 U/L 27  27  29      CTAP 08/30/2022: FINDINGS: Lower chest: No acute abnormality.   Hepatobiliary: The liver is 19 cm in length with homogeneous enhancement. The gallbladder and bile ducts are unremarkable.   Spleen: The spleen is mildly prominent, 13.3 cm AP, with homogeneous  enhancement.   Pancreas: No focal abnormality.   Adrenals/Urinary Tract: There is no adrenal mass. There is homogeneous bilateral renal cortical enhancement. There is no urinary stone or obstruction. The bladder is unremarkable for the degree of distention.   Stomach/Bowel: There is a tiny hiatal hernia. The gastric wall and unopacified small bowel are unremarkable. The appendix is normal. There is diffuse colonic diverticulosis.   Multiple there is an 8 cm segment of mid to distal descending colon with moderate wall thickening, surrounding stranding and mild reactive fluid extending along the left paracolic gutter, findings consistent with acute diverticulitis. No diverticular abscess is seen or free air.   Vascular/Lymphatic: No significant vascular findings are present. No enlarged abdominal or pelvic lymph nodes.   Reproductive: The uterus is anteverted, mildly enlarged and heterogeneous most likely due to a fibroid morphology. The ovaries are not enlarged. There are  multiple phleboliths along the gonadal veins.   Other: As above there is mild nonlocalizing reactive fluid extending along the mid to distal left paracolic gutter, trace amount in the pelvic cul-de-sac. There is no free air, free hemorrhage or abscess. There is mild umbilical rectus diastasis and small umbilical fat hernia. There is no incarcerated hernia.   Musculoskeletal: Slight lumbar levoscoliosis. Bone island anterior right hemisacrum. No acute or significant osseous findings.   IMPRESSION: 1. Diffuse colonic diverticulosis, with moderate wall thickening and stranding involving the mid to distal descending colon in keeping with acute diverticulitis. There is nonlocalizing reactive fluid extending along the distal left paracolic gutter into the pelvis but no evidence of abscess or free air. 2. Follow-up colonoscopy recommended after treatment to exclude underlying lesion. 3. Mildly prominent liver and spleen. 4. Mildly prominent fibroid uterus. 5. Umbilical fat hernia.   Past Medical History:  Diagnosis Date   Abnormal Pap smear    Adenomyosis    Anemia    Anxiety    Back pain    BV (bacterial vaginosis) 2011   Chlamydia infection    CIN I (cervical intraepithelial neoplasia I) 2008   Depression    Dysmenorrhea    Dyspnea    Dysrhythmia 2009   TACHYCARDIA;   HAD W/U 2010? POSSIBLE HEART VALVE ISSUE; NOT F/U NEEDED X 3 YEARS PER PT   Dysuria 2010   Edema of both lower extremities    Fatigue    H/O pre-eclampsia in prior pregnancy, currently pregnant    H/O varicella    H/O vitamin D deficiency    Headache(784.0)    MIGRAINES   HTN (hypertension)    Hx of breast reduction, elective    Hx: UTI (urinary tract infection)    Hyperlipidemia    Infection    UTI DURING PREGNANCY   Infection    YEAST WITH PREGNANCY   Infection    CHLAMYDIA X 1   Knee pain    LGSIL (low grade squamous intraepithelial dysplasia) 04/04/2008   CRYO; LEEP; LAST PAP 05/2011   Low iron     PIH (pregnancy induced hypertension) 11/2012   Today pp one month.  B/P nl, and 122/76 on repeat-sitting   Postpartum depression 2011   NO MEDS   Pregnancy induced hypertension    ALL PREGNANCIES   Shortness of breath    WITH EXERTION SICNE PREGNANCY   Tachycardia    Tachycardia 2010   HAS HAD WORKUP. UNSURE IF HAS POSSIBLE HEART VALVE ISSUE   Vulvitis 2009   Past Surgical History:  Procedure Laterality Date   BREAST REDUCTION SURGERY  12/08/2006   BREAST SURGERY     CERVICAL BIOPSY  W/ LOOP ELECTRODE EXCISION  2010   DILATION AND CURETTAGE OF UTERUS     FINGER SURGERY     Right index finger   TUBAL LIGATION  01/09/2013   Procedure: POST PARTUM TUBAL LIGATION;  Surgeon: Michael Litter, MD;  Location: [facility];  Service: Gynecology;  Laterality: Bilateral;  post partum tubal ligation bilateral   US ECHOCARDIOGRAPHY  06/18/2009   ef 55-60%   WISDOM TOOTH EXTRACTION     Social History: She is married.  She has 1 son and 3 daughters.  She is a case Production designer, theatre/television/film.  Non-smoker.  Infrequent alcohol use.  No drug use.  Family History: Mother with history of asthma, sleep apnea, obesity, heart disease, anxiety, depression, diabetes and she was diagnosed colon cancer age 36.  Father with heart disease, hypertension and hypercholesterolemia.   Allergies  Allergen Reactions   Latex Itching and Rash      Outpatient Encounter Medications as of 09/22/2022  Medication Sig   ferrous sulfate 325 (65 FE) MG tablet Take 1 tablet (325 mg total) by mouth 3 (three) times daily with meals.   metFORMIN (GLUCOPHAGE) 500 MG tablet Take 1 tablet (500 mg total) by mouth daily with breakfast.   Semaglutide-Weight Management (WEGOVY) 0.25 MG/0.5ML SOAJ Inject 0.25 mg into the skin once a week.   tranexamic acid (LYSTEDA) 650 MG TABS tablet Take 2 tablets (1,300 mg total) by mouth 3 (three) times daily.   No facility-administered encounter medications on file as of 09/22/2022.    REVIEW OF SYSTEMS:   Gen: Denies fever, sweats or chills. No weight loss.  CV: Denies chest pain, palpitations or edema. Resp: Denies cough, shortness of breath of hemoptysis.  GI: See HPI.  No GERD symptoms. GU : Denies urinary burning, blood in urine, increased urinary frequency or incontinence. MS: Denies joint pain, muscles aches or weakness. Derm: Denies rash, itchiness, skin lesions or unhealing ulcers. Psych: + Anxiety.  Heme: Denies bruising, easy bleeding. Neuro:  Denies headaches, dizziness or paresthesias. Endo:  Denies any problems with DM, thyroid or adrenal function.  PHYSICAL EXAM: BP 138/72 (BP Location: Left Arm, Patient Position: Sitting, Cuff Size: Normal)   Pulse 72   Ht 5\' 2"  (1.575 m)   Wt 197 lb 3 oz (89.4 kg)   BMI 36.07 kg/m   Wt Readings from Last 3 Encounters:  09/22/22 197 lb 3 oz (89.4 kg)  09/01/22 192 lb (87.1 kg)  08/12/22 193 lb (87.5 kg)    General: 41 year old female in no acute distress. Head: Normocephalic and atraumatic. Eyes:  Sclerae non-icteric, conjunctive pink. Ears: Normal auditory acuity. Mouth: Dentition intact. No ulcers or lesions.  Neck: Supple, no lymphadenopathy or thyromegaly.  Lungs: Clear bilaterally to auscultation without wheezes, crackles or rhonchi. Heart: Regular rate and rhythm. No murmur, rub or gallop appreciated.  Abdomen: Soft, nontender, non distended. No masses. No hepatosplenomegaly. Normoactive bowel sounds x 4 quadrants.  Rectal: Deferred. Musculoskeletal: Symmetrical with no gross deformities. Skin: Warm and dry. No rash or lesions on visible extremities. Extremities: No edema. Neurological: Alert oriented x 4, no focal deficits.  Psychological:  Alert and cooperative. Normal mood and affect.  ASSESSMENT AND PLAN:  54) 41 year old female with acute diverticulitis to the distal descending colon which resolved after she completed Augmentin 875 mg 1 p.o. twice daily.  Mother with history of colon cancer diagnosed at the age of  58. -Colonoscopy benefits and risks discussed including  risk with sedation, risk of bleeding, perforation and infection  -Colonoscopy scheduled with Dr. Havery Moros 10/15/2022 -Patient to contact her office if abdominal pain recurs -Drink 64 ounces of water daily -Diet as tolerated  2) Menorrhagia, patient is scheduled for laparoscopic hysterectomy with bilateral salpingo-oophorectomy with Dr. Sumner Boast on 10/21/2022.    3) Mildly prominent liver and spleen per CTAP 08/2022, likely due to obesity.  Normal LFTs.       CC:  Mickie Hillier, PA-C

## 2022-09-22 NOTE — Progress Notes (Signed)
Agree with assessment and plan as outlined.  

## 2022-09-24 NOTE — Progress Notes (Signed)
Cardiology Office Note   Date:  09/27/2022   ID:  Patricia Lin, DOB [birth year], MRN [id number]  PCP:  Rema Fendt, NP  Cardiologist:   Dietrich Pates, MD   Pt presents for follow up of HTN    History of Present Illness: Patricia Lin is a 41 y.o. female with a history of Fe deficient anemia( menses) preeclampsia, hx murmur. I saw her   in June 2023   She was referred for murmur  She did not have on exam.  Her BP was elevated and amlodpine was added 2.5 mg   Echo showed normal LV funciton   Normal valve function  Since seen the pt ins being followed in weight loss clinic    She has lost over 10 lbs HR BP was running low so amlodioine was stopped  The pt says she is not dizzy   Breathing is OK   No CP     She does admit to binging with sweets at times    Current Meds  Medication Sig   ferrous sulfate 325 (65 FE) MG tablet Take 1 tablet (325 mg total) by mouth 3 (three) times daily with meals.   metFORMIN (GLUCOPHAGE) 500 MG tablet Take 1 tablet (500 mg total) by mouth daily with breakfast.   Semaglutide-Weight Management (WEGOVY) 0.25 MG/0.5ML SOAJ Inject 0.25 mg into the skin once a week.     Allergies:   Latex   Past Medical History:  Diagnosis Date   Abnormal Pap smear    Adenomyosis    Anemia    Anxiety    Back pain    BV (bacterial vaginosis) 2011   Chlamydia infection    CIN I (cervical intraepithelial neoplasia I) 2008   Depression    Dysmenorrhea    Dyspnea    Dysrhythmia 2009   TACHYCARDIA;   HAD W/U 2010? POSSIBLE HEART VALVE ISSUE; NOT F/U NEEDED X 3 YEARS PER PT   Dysuria 2010   Edema of both lower extremities    Fatigue    H/O pre-eclampsia in prior pregnancy, currently pregnant    H/O varicella    H/O vitamin D deficiency    Headache(784.0)    MIGRAINES   HTN (hypertension)    Hx of breast reduction, elective    Hx: UTI (urinary tract infection)    Hyperlipidemia    Infection    UTI DURING PREGNANCY   Infection    YEAST WITH PREGNANCY    Infection    CHLAMYDIA X 1   Knee pain    LGSIL (low grade squamous intraepithelial dysplasia) 04/04/2008   CRYO; LEEP; LAST PAP 05/2011   Low iron    PIH (pregnancy induced hypertension) 11/2012   Today pp one month.  B/P nl, and 122/76 on repeat-sitting   Postpartum depression 2011   NO MEDS   Pregnancy induced hypertension    ALL PREGNANCIES   Shortness of breath    WITH EXERTION SICNE PREGNANCY   Tachycardia    Tachycardia 2010   HAS HAD WORKUP. UNSURE IF HAS POSSIBLE HEART VALVE ISSUE   Vulvitis 2009    Past Surgical History:  Procedure Laterality Date   BREAST REDUCTION SURGERY  12/08/2006   BREAST SURGERY     CERVICAL BIOPSY  W/ LOOP ELECTRODE EXCISION  2010   DILATION AND CURETTAGE OF UTERUS     FINGER SURGERY     Right index finger   TUBAL LIGATION  01/09/2013   Procedure: POST PARTUM TUBAL  LIGATION;  Surgeon: Betsy Coder, MD;  Location: [facility];  Service: Gynecology;  Laterality: Bilateral;  post partum tubal ligation bilateral   US ECHOCARDIOGRAPHY  06/18/2009   ef 55-60%   WISDOM TOOTH EXTRACTION       Social History:  The patient  reports that she has never smoked. She has never used smokeless tobacco. She reports current alcohol use. She reports that she does not use drugs.   Family History:  The patient's family history includes Alcohol abuse in her maternal uncle; Anxiety disorder in her mother; Asthma in her daughter and mother; Coronary artery disease in her mother; Depression in her mother; Diabetes in her mother; Drug abuse in her maternal uncle; Eating disorder in her mother; Heart disease in her father and mother; High Cholesterol in her father; Hyperlipidemia in her mother; Hypertension in her father, maternal grandfather, maternal grandmother, and mother; Obesity in her mother; Sleep apnea in her mother.    ROS:  Please see the history of present illness. All other systems are reviewed and  Negative to the above problem except as noted.     PHYSICAL EXAM: VS:  BP 112/64   Pulse 68   Ht 5\' 2"  (1.575 m)   Wt 196 lb 12.8 oz (89.3 kg)   SpO2 95%   BMI 36.00 kg/m   GEN: Morbidly obese 41 yo in no acute distress  HEENT: normal  Neck: no JVD, no carotid bruits  Cardiac: RRR; no murmurs  No LE edema  Respiratory:  clear to auscultation bilaterally, GI: soft, nontender. No hepatomegaly  MS: no deformity Moving all extremities   Skin: warm and dry, no rash Neuro:  Strength and sensation are intact Psych: euthymic mood, full affect   EKG:  EKG  SR 68 bpm   Echo   July 2023   1. Left ventricular ejection fraction, by estimation, is 60 to 65%. The  left ventricle has normal function. The left ventricle has no regional  wall motion abnormalities. There is mild left ventricular hypertrophy.  Left ventricular diastolic parameters  were normal.   2. Right ventricular systolic function is normal. The right ventricular  size is normal. Tricuspid regurgitation signal is inadequate for assessing  PA pressure.   3. The mitral valve is normal in structure. Trivial mitral valve  regurgitation. No evidence of mitral stenosis.   4. The aortic valve is tricuspid. Aortic valve regurgitation is not  visualized. No aortic stenosis is present.   5. The inferior vena cava is dilated in size with >50% respiratory  variability, suggesting right atrial pressure of 8 mmHg.    Lipid Panel    Component Value Date/Time   CHOL 184 05/02/2022 1000   TRIG 53 05/02/2022 1000   HDL 68 05/02/2022 1000   CHOLHDL 2.7 05/02/2022 1000   LDLCALC 106 (H) 05/02/2022 1000      Wt Readings from Last 3 Encounters:  09/26/22 196 lb 12.8 oz (89.3 kg)  09/22/22 197 lb 3 oz (89.4 kg)  09/01/22 192 lb (87.1 kg)      ASSESSMENT AND PLAN:    1  Hx murmur.None on exam today  Echo normla    2  Blood pressure BP is better off of meds   Wt loss probably explains      3  LIpids   DLDL 106  HDL 68   Trig 43   Follow  4  Diet   Again, reviewed    Cut back on sugars.  Minimzize processed foods     5  Anemia Follows in gyn and hematology  Last Hgb 12.8     Current medicines are reviewed at length with the patient today.  The patient does not have concerns regarding medicines.  Signed, Dietrich Pates, MD  09/27/2022 11:04 AM    [facility] [address] [place], [place], [place]  [address] Phone: [phone]; Fax: [phone]

## 2022-09-26 ENCOUNTER — Encounter: Payer: Self-pay | Admitting: Internal Medicine

## 2022-09-26 ENCOUNTER — Ambulatory Visit: Payer: No Typology Code available for payment source | Attending: Internal Medicine | Admitting: Internal Medicine

## 2022-09-26 VITALS — BP 112/64 | HR 68 | Ht 62.0 in | Wt 196.8 lb

## 2022-09-26 DIAGNOSIS — R011 Cardiac murmur, unspecified: Secondary | ICD-10-CM

## 2022-09-26 NOTE — Patient Instructions (Signed)
Medication Instructions:   *If you need a refill on your cardiac medications before your next appointment, please call your pharmacy*   Lab Work:  If you have labs (blood work) drawn today and your tests are completely normal, you will receive your results only by: MyChart Message (if you have MyChart) OR A paper copy in the mail If you have any lab test that is abnormal or we need to change your treatment, we will call you to review the results.   Testing/Procedures:    Follow-Up: At [facility], you and your health needs are our priority.  As part of our continuing mission to provide you with exceptional heart care, we have created designated Provider Care Teams.  These Care Teams include your primary Cardiologist (physician) and Advanced Practice Providers (APPs -  Physician Assistants and Nurse Practitioners) who all work together to provide you with the care you need, when you need it.  We recommend signing up for the patient portal called "MyChart".  Sign up information is provided on this After Visit Summary.  MyChart is used to connect with patients for Virtual Visits (Telemedicine).  Patients are able to view lab/test results, encounter notes, upcoming appointments, etc.  Non-urgent messages can be sent to your provider as well.   To learn more about what you can do with MyChart, go to [email or web address].    Important Information About Sugar       

## 2022-09-29 ENCOUNTER — Other Ambulatory Visit (HOSPITAL_COMMUNITY): Payer: Self-pay

## 2022-09-29 ENCOUNTER — Encounter (INDEPENDENT_AMBULATORY_CARE_PROVIDER_SITE_OTHER): Payer: Self-pay | Admitting: Family Medicine

## 2022-09-29 ENCOUNTER — Ambulatory Visit (INDEPENDENT_AMBULATORY_CARE_PROVIDER_SITE_OTHER): Payer: No Typology Code available for payment source | Admitting: Family Medicine

## 2022-09-29 VITALS — BP 134/86 | HR 68 | Temp 98.2°F | Ht 62.0 in | Wt 194.0 lb

## 2022-09-29 DIAGNOSIS — R7303 Prediabetes: Secondary | ICD-10-CM

## 2022-09-29 DIAGNOSIS — Z6835 Body mass index (BMI) 35.0-35.9, adult: Secondary | ICD-10-CM

## 2022-09-29 DIAGNOSIS — F5081 Binge eating disorder, mild: Secondary | ICD-10-CM | POA: Insufficient documentation

## 2022-09-29 DIAGNOSIS — E669 Obesity, unspecified: Secondary | ICD-10-CM

## 2022-09-29 MED ORDER — WEGOVY 0.25 MG/0.5ML ~~LOC~~ SOAJ
0.2500 mg | SUBCUTANEOUS | 0 refills | Status: DC
  Filled 2022-09-29: qty 2, 28d supply, fill #0

## 2022-09-30 ENCOUNTER — Other Ambulatory Visit (HOSPITAL_COMMUNITY): Payer: Self-pay

## 2022-10-01 ENCOUNTER — Encounter: Payer: Self-pay | Admitting: Obstetrics and Gynecology

## 2022-10-01 ENCOUNTER — Ambulatory Visit (INDEPENDENT_AMBULATORY_CARE_PROVIDER_SITE_OTHER): Payer: No Typology Code available for payment source | Admitting: Obstetrics and Gynecology

## 2022-10-01 VITALS — BP 110/82 | HR 77 | Ht 62.0 in | Wt 199.4 lb

## 2022-10-01 DIAGNOSIS — Z87898 Personal history of other specified conditions: Secondary | ICD-10-CM

## 2022-10-01 DIAGNOSIS — D500 Iron deficiency anemia secondary to blood loss (chronic): Secondary | ICD-10-CM

## 2022-10-01 DIAGNOSIS — N920 Excessive and frequent menstruation with regular cycle: Secondary | ICD-10-CM

## 2022-10-01 NOTE — Progress Notes (Signed)
error 

## 2022-10-01 NOTE — Progress Notes (Signed)
GYNECOLOGY  VISIT   HPI: 41 y.o.   Married Black or [place] American Not Hispanic or Latino  female   [phone] with Patient's last menstrual period was 09/26/2022.   here for pre-op.   H/o menorrhagia leading to anemia. Ultrasound with signs of adenomyosis, otherwise normal. Moderate cramps.   05/02/22: TSH 0.892, negative GC/CT/Trich, Pap normal with negative HPV. Endometrial biopsy from 8/23 was benign.   She has had iron transfusions. Last Hgb was 12.8 on 08/30/22 (up from 7.8 in 5/23).  She has a h/o HTN, not currently on medication. Not consistently high.   She isn't a candidate for OCP's. Reports the Lysteda didn't help her cycle. She didn't like how she felt on aygestin. Declines mirena IUD.  She is on metformin for prediabetes.  GYNECOLOGIC HISTORY: Patient's last menstrual period was 09/26/2022. Contraception:BTL Menopausal hormone therapy: n/a        OB History     Gravida  7   Para  4   Term  4   Preterm      AB  3   Living  4      SAB  1   IAB  2   Ectopic      Multiple      Live Births  4        Obstetric Comments  2011 PRECLAMPSIA PP; IN ICU X 3 DAYS          H/O FTNSVD's, largest was 6 lb 5 oz.  Patient Active Problem List   Diagnosis Date Noted   Binge-Eating Disorder, Mild 09/29/2022   Prediabetes 08/12/2022   Depression 08/12/2022   Class 1 obesity with serious comorbidity and body mass index (BMI) of 34.0 to 34.9 in adult 08/12/2022   Iron deficiency anemia 07/29/2022   Insulin resistance 06/28/2022   Essential hypertension 06/28/2022   Iron deficiency 06/28/2022   Vitamin D deficiency 06/28/2022   Other hyperlipidemia 06/28/2022   At risk of diabetes mellitus 06/28/2022   Iron deficiency anemia due to chronic blood loss 05/07/2022   Right lower quadrant abdominal pain 01/28/2019   Generalized headaches 12/17/2018   Anxiety 12/17/2018   History of anemia 05/26/2018   Anemia 12/05/2017   Routine health maintenance 03/03/2013    S/P tubal ligation 02/09/2013   Vaginal delivery 01/09/2013   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   Fracture of left ulna 09/27/12 10/23/2012   Cervical funneling 09/18/2012   Cervical shortening 09/18/2012   Symptomatic anemia 09/03/2012   H/O pre-eclampsia in prior pregnancy, currently pregnant 08/19/2012   Chronic hypertension in pregnancy 08/19/2012   Hx LEEP (loop electrosurgical excision procedure), cervix, pregnancy 08/19/2012   Hx of postpartum depression, currently pregnant 08/19/2012   Late prenatal care 08/19/2012   Hx of precipitous labor and deliveries, antepartum 08/19/2012   Muscle tension HAs 08/19/2012    Past Medical History:  Diagnosis Date   Abnormal Pap smear    Adenomyosis    Anemia    Anxiety    Back pain    BV (bacterial vaginosis) 2011   Chlamydia infection    CIN I (cervical intraepithelial neoplasia I) 2008   Depression    Dysmenorrhea    Dyspnea    Dysrhythmia 2009   TACHYCARDIA;   HAD W/U 2010? POSSIBLE HEART VALVE ISSUE; NOT F/U NEEDED X 3 YEARS PER PT   Dysuria 2010   Edema of both lower extremities    Fatigue    H/O pre-eclampsia in prior pregnancy, currently pregnant    H/O  varicella    H/O vitamin D deficiency    Headache(784.0)    MIGRAINES   HTN (hypertension)    Hx of breast reduction, elective    Hx: UTI (urinary tract infection)    Hyperlipidemia    Infection    UTI DURING PREGNANCY   Infection    YEAST WITH PREGNANCY   Infection    CHLAMYDIA X 1   Knee pain    LGSIL (low grade squamous intraepithelial dysplasia) 04/04/2008   CRYO; LEEP; LAST PAP 05/2011   Low iron    PIH (pregnancy induced hypertension) 11/2012   Today pp one month.  B/P nl, and 122/76 on repeat-sitting   Postpartum depression 2011   NO MEDS   Pregnancy induced hypertension    ALL PREGNANCIES   Shortness of breath    WITH EXERTION SICNE PREGNANCY   Tachycardia    Tachycardia 2010   HAS HAD WORKUP. UNSURE IF HAS POSSIBLE HEART VALVE ISSUE   Vulvitis 2009     Past Surgical History:  Procedure Laterality Date   BREAST REDUCTION SURGERY  12/08/2006   BREAST SURGERY     CERVICAL BIOPSY  W/ LOOP ELECTRODE EXCISION  2010   DILATION AND CURETTAGE OF UTERUS     FINGER SURGERY     Right index finger   TUBAL LIGATION  01/09/2013   Procedure: POST PARTUM TUBAL LIGATION;  Surgeon: Betsy Coder, MD;  Location: [facility];  Service: Gynecology;  Laterality: Bilateral;  post partum tubal ligation bilateral   US ECHOCARDIOGRAPHY  06/18/2009   ef 55-60%   WISDOM TOOTH EXTRACTION      Current Outpatient Medications  Medication Sig Dispense Refill   ferrous sulfate 325 (65 FE) MG tablet Take 1 tablet (325 mg total) by mouth 3 (three) times daily with meals. 90 tablet 3   metFORMIN (GLUCOPHAGE) 500 MG tablet Take 1 tablet (500 mg total) by mouth daily with breakfast. 30 tablet 0   Semaglutide-Weight Management (WEGOVY) 0.25 MG/0.5ML SOAJ Inject 0.25 mg into the skin once a week. 2 mL 0   No current facility-administered medications for this visit.   Not taking the [facility], won't start prior to surgery.   ALLERGIES: Latex  Family History  Problem Relation Age of Onset   Hypertension Mother    Hyperlipidemia Mother    Coronary artery disease Mother    Heart disease Mother        high cholesterol   Asthma Mother    Diabetes Mother    Depression Mother    Anxiety disorder Mother    Sleep apnea Mother    Obesity Mother    Eating disorder Mother    Hypertension Father    High Cholesterol Father    Heart disease Father    Hypertension Maternal Grandmother    Hypertension Maternal Grandfather    Asthma Daughter    Alcohol abuse Maternal Uncle    Drug abuse Maternal Uncle     Social History   Socioeconomic History   Marital status: Married    Spouse name: Rosario Jacks   Number of children: 3   Years of education: 38   Highest education level: Not on file  Occupational History   Occupation: CUSTOMER SERVICE     Employer: [employer]    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  Tobacco Use   Smoking status: Never   Smokeless tobacco: Never  Vaping Use   Vaping Use: Never used  Substance and Sexual Activity   Alcohol use:  Yes    Comment: socially   Drug use: No   Sexual activity: Yes    Partners: Male    Birth control/protection: Surgical  Other Topics Concern   Not on file  Social History Narrative   Not on file   Social Determinants of Health   Financial Resource Strain: Not on file  Food Insecurity: Not on file  Transportation Needs: Not on file  Physical Activity: Not on file  Stress: Not on file  Social Connections: Not on file  Intimate Partner Violence: Not on file    Review of Systems  All other systems reviewed and are negative.   PHYSICAL EXAMINATION:    BP 110/82   Pulse 77   Ht 5\' 2"  (1.575 m) Comment: patient reports due to hair  Wt 199 lb 6.4 oz (90.4 kg)   LMP 09/26/2022   SpO2 100%   BMI 36.47 kg/m     General appearance: alert, cooperative and appears stated age Neck: no adenopathy, supple, symmetrical, trachea midline and thyroid normal to inspection and palpation Heart: regular rate and rhythm Lungs: CTAB Abdomen: soft, non-tender; bowel sounds normal; no masses,  no organomegaly Extremities: normal, atraumatic, no cyanosis Skin: normal color, texture and turgor, no rashes or lesions Lymph: normal cervical supraclavicular and inguinal nodes Neurologic: grossly normal  1. Menorrhagia with regular cycle Desires definitive treatment.  - CBC Discussed total laparoscopic hysterectomy, bilateral salpingectomies and cystoscopy. Reviewed the risks of the procedure, including infection, bleeding, damage to bowel/badder/vessels/ureters.  Discussed the possible need for laparotomy. Discussed post operative recovery and risk of cuff dehiscence. All of her questions were answered  2. Iron deficiency anemia due to chronic blood loss H/O iron transfusions, on oral iron. Currently on her  cycle, starting to slow down - CBC - Ferritin  3. History of prediabetes On metformin - Hemoglobin A1c

## 2022-10-02 ENCOUNTER — Ambulatory Visit: Payer: No Typology Code available for payment source | Admitting: Internal Medicine

## 2022-10-02 ENCOUNTER — Other Ambulatory Visit: Payer: No Typology Code available for payment source

## 2022-10-02 LAB — CBC
HCT: 34.5 % — ABNORMAL LOW (ref 35.0–45.0)
Hemoglobin: 11.1 g/dL — ABNORMAL LOW (ref 11.7–15.5)
MCH: 27.5 pg (ref 27.0–33.0)
MCHC: 32.2 g/dL (ref 32.0–36.0)
MCV: 85.6 fL (ref 80.0–100.0)
MPV: 10.6 fL (ref 7.5–12.5)
Platelets: 268 10*3/uL (ref 140–400)
RBC: 4.03 10*6/uL (ref 3.80–5.10)
RDW: 13.2 % (ref 11.0–15.0)
WBC: 4.3 10*3/uL (ref 3.8–10.8)

## 2022-10-02 LAB — HEMOGLOBIN A1C
Hgb A1c MFr Bld: 5.2 % of total Hgb (ref <5.7)
Mean Plasma Glucose: 103 mg/dL
eAG (mmol/L): 5.7 mmol/L

## 2022-10-02 LAB — FERRITIN: Ferritin: 7 ng/mL — ABNORMAL LOW (ref 16–232)

## 2022-10-05 NOTE — Progress Notes (Unsigned)
Chief Complaint:   OBESITY Patricia Lin is here to discuss her progress with her obesity treatment plan along with follow-up of her obesity related diagnoses. Patricia Lin is on the Category 2 Plan and states she is following her eating plan approximately 70% of the time. Patricia Lin states she is walking and running 3 miles for 60-120 minutes 3 times per week.  Today's visit was #: 7 Starting weight: 203 lbs Starting date: 09/08/2018 Today's weight: 194 lbs Today's date: 09/29/2022 Total lbs lost to date: 9 Total lbs lost since last in-office visit: 0  Interim History: Patricia Lin has struggled with stress eating. She was given notice that she will be laid off at Christmas and she is looking for a new job. She has done more comfort eating.   Subjective:   1. Prediabetes Patricia Lin is stable on metformin and GLP-1. She denies nausea or vomiting.   2. Binge-Eating Disorder, Mild Patricia Lin is seeing Dr. Mallie Mussel and she is doing better with controlling her binges.   Assessment/Plan:   1. Prediabetes Patricia Lin will continue metformin and we will continue to follow.   2. Binge-Eating Disorder, Mild Patricia Lin will continue with Dr. Mallie Mussel and she will work on stress reduction techniques.   3. Obesity, Current BMI 35.5 Patricia Lin is currently in the action stage of change. As such, her goal is to continue with weight loss efforts. She has agreed to the Category 2 Plan.   Eating Out handout was given.   We discussed various medication options to help Patricia Lin with her weight loss efforts and we both agreed to continue Wegovy at 0.25 mg once weekly, and we will refill for 1 month.  - Semaglutide-Weight Management (WEGOVY) 0.25 MG/0.5ML SOAJ; Inject 0.25 mg into the skin once a week.  Dispense: 2 mL; Refill: 0  Exercise goals: As is.   Behavioral modification strategies: emotional eating strategies.  Patricia Lin has agreed to follow-up with our clinic in 3 to 4 weeks. She was informed of the importance of frequent  follow-up visits to maximize her success with intensive lifestyle modifications for her multiple health conditions.   Objective:   Blood pressure 134/86, pulse 68, temperature 98.2 F (36.8 C), height 5\' 2"  (1.575 m), weight 194 lb (88 kg), SpO2 100 %. Body mass index is 35.48 kg/m.  General: Cooperative, alert, well developed, in no acute distress. HEENT: Conjunctivae and lids unremarkable. Cardiovascular: Regular rhythm.  Lungs: Normal work of breathing. Neurologic: No focal deficits.   Lab Results  Component Value Date   CREATININE 0.62 08/30/2022   BUN 19 08/30/2022   NA 136 08/30/2022   K 3.8 08/30/2022   CL 107 08/30/2022   CO2 21 (L) 08/30/2022   Lab Results  Component Value Date   ALT 27 08/30/2022   AST 20 08/30/2022   ALKPHOS 69 08/30/2022   BILITOT 0.9 08/30/2022   Lab Results  Component Value Date   HGBA1C 5.2 10/01/2022   HGBA1C 5.5 05/02/2022   HGBA1C 5.2 09/08/2018   Lab Results  Component Value Date   INSULIN 15.1 06/11/2022   INSULIN 11.5 09/08/2018   Lab Results  Component Value Date   TSH 0.907 05/07/2022   Lab Results  Component Value Date   CHOL 184 05/02/2022   HDL 68 05/02/2022   LDLCALC 106 (H) 05/02/2022   TRIG 53 05/02/2022   CHOLHDL 2.7 05/02/2022   Lab Results  Component Value Date   VD25OH 22.2 (L) 06/11/2022   VD25OH 9.6 (L) 09/08/2018   Lab Results  Component Value Date   WBC 4.3 10/01/2022   HGB 11.1 (L) 10/01/2022   HCT 34.5 (L) 10/01/2022   MCV 85.6 10/01/2022   PLT 268 10/01/2022   Lab Results  Component Value Date   IRON 92 07/02/2022   TIBC 381 07/02/2022   FERRITIN 7 (L) 10/01/2022   Attestation Statements:   Reviewed by clinician on day of visit: allergies, medications, problem list, medical history, surgical history, family history, social history, and previous encounter notes.   I, Burt Knack, am acting as transcriptionist for Quillian Quince, MD.  I have reviewed the above documentation for  accuracy and completeness, and I agree with the above. -  Quillian Quince, MD

## 2022-10-06 ENCOUNTER — Inpatient Hospital Stay (HOSPITAL_BASED_OUTPATIENT_CLINIC_OR_DEPARTMENT_OTHER): Payer: No Typology Code available for payment source | Admitting: Internal Medicine

## 2022-10-06 ENCOUNTER — Inpatient Hospital Stay: Payer: No Typology Code available for payment source | Attending: Internal Medicine

## 2022-10-06 ENCOUNTER — Encounter: Payer: Self-pay | Admitting: Obstetrics and Gynecology

## 2022-10-06 ENCOUNTER — Telehealth (INDEPENDENT_AMBULATORY_CARE_PROVIDER_SITE_OTHER): Payer: No Typology Code available for payment source | Admitting: Psychology

## 2022-10-06 VITALS — BP 125/92 | HR 67 | Temp 98.2°F | Resp 16 | Wt 197.1 lb

## 2022-10-06 DIAGNOSIS — F331 Major depressive disorder, recurrent, moderate: Secondary | ICD-10-CM

## 2022-10-06 DIAGNOSIS — D500 Iron deficiency anemia secondary to blood loss (chronic): Secondary | ICD-10-CM | POA: Diagnosis not present

## 2022-10-06 DIAGNOSIS — F5081 Binge eating disorder, mild: Secondary | ICD-10-CM

## 2022-10-06 DIAGNOSIS — I10 Essential (primary) hypertension: Secondary | ICD-10-CM | POA: Insufficient documentation

## 2022-10-06 DIAGNOSIS — D649 Anemia, unspecified: Secondary | ICD-10-CM | POA: Diagnosis not present

## 2022-10-06 DIAGNOSIS — N920 Excessive and frequent menstruation with regular cycle: Secondary | ICD-10-CM | POA: Insufficient documentation

## 2022-10-06 LAB — CBC WITH DIFFERENTIAL (CANCER CENTER ONLY)
Abs Immature Granulocytes: 0 10*3/uL (ref 0.00–0.07)
Basophils Absolute: 0 10*3/uL (ref 0.0–0.1)
Basophils Relative: 1 %
Eosinophils Absolute: 0.1 10*3/uL (ref 0.0–0.5)
Eosinophils Relative: 1 %
HCT: 35.4 % — ABNORMAL LOW (ref 36.0–46.0)
Hemoglobin: 11.3 g/dL — ABNORMAL LOW (ref 12.0–15.0)
Immature Granulocytes: 0 %
Lymphocytes Relative: 39 %
Lymphs Abs: 1.7 10*3/uL (ref 0.7–4.0)
MCH: 27.4 pg (ref 26.0–34.0)
MCHC: 31.9 g/dL (ref 30.0–36.0)
MCV: 85.7 fL (ref 80.0–100.0)
Monocytes Absolute: 0.3 10*3/uL (ref 0.1–1.0)
Monocytes Relative: 8 %
Neutro Abs: 2.2 10*3/uL (ref 1.7–7.7)
Neutrophils Relative %: 51 %
Platelet Count: 281 10*3/uL (ref 150–400)
RBC: 4.13 MIL/uL (ref 3.87–5.11)
RDW: 13.9 % (ref 11.5–15.5)
WBC Count: 4.3 10*3/uL (ref 4.0–10.5)
nRBC: 0 % (ref 0.0–0.2)

## 2022-10-06 LAB — IRON AND IRON BINDING CAPACITY (CC-WL,HP ONLY)
Iron: 33 ug/dL (ref 28–170)
Saturation Ratios: 9 % — ABNORMAL LOW (ref 10.4–31.8)
TIBC: 385 ug/dL (ref 250–450)
UIBC: 352 ug/dL (ref 148–442)

## 2022-10-06 NOTE — Progress Notes (Signed)
  Office: [phone]  /  Fax: [phone]    Date: October 06, 2022    Appointment Start Time: 8:33am Duration: 23 minutes Provider: Glennie Isle, Psy.D. Type of Session: Individual Therapy  Location of Patient: Home (private location) Location of Provider: Provider's Home (private office) Type of Contact: Telepsychological Visit via MyChart Video Visit  Session Content: Patricia Lin is a 41 y.o. female presenting for a follow-up appointment to address the previously established treatment goal of increasing coping skills.Today's appointment was a telepsychological visit. Patricia Lin provided verbal consent for today's telepsychological appointment and she is aware she is responsible for securing confidentiality on her end of the session. Prior to proceeding with today's appointment, Patricia Lin's physical location at the time of this appointment was obtained as well a phone number she could be reached at in the event of technical difficulties. Patricia Lin and this provider participated in today's telepsychological service.   This provider conducted a brief check-in. Patricia Lin shared, "Things have been pretty good with eating." Further explored and processed. She noted recent gain weight resulted in renewed motivation. Patricia Lin continues to report an overall reduction in emotional and binge eating behaviors. Remainder of session focused further on self-compassion. She recalled a few instances of self-compassion which resulted in her spending time on things she enjoys and hiring a Chartered certified accountant. Patricia Lin was encouraged to reflect on the story she tells herself that is stuck on repeat related to weight loss and eating habits and then assisted her in developing a more compassionate version. Furthermore, Patricia Lin denied experiencing suicidal, self-injurious, and homicidal ideation, plan, or intent since the last appointment with this provider and described future plans that include ongoing efforts to improve her well-being and  eating habits. Overall, Patricia Lin was receptive to today's appointment as evidenced by openness to sharing, responsiveness to feedback, and willingness to work toward increasing self-compassion.  Mental Status Examination:  Appearance: neat Behavior: appropriate to circumstances Mood: neutral Affect: mood congruent Speech: WNL Eye Contact: appropriate Psychomotor Activity: WNL Gait: unable to assess Thought Process: linear, logical, and goal directed and denies suicidal, homicidal, and self-harm ideation, plan and intent  Thought Content/Perception: no hallucinations, delusions, bizarre thinking or behavior endorsed or observed Orientation: AAOx4 Memory/Concentration: memory, attention, language, and fund of knowledge intact  Insight: fair Judgment: fair  Interventions:  Conducted a brief chart review Conducted a risk assessment Provided empathic reflections and validation Reviewed content from the previous session Employed supportive psychotherapy interventions to facilitate reduced distress and to improve coping skills with identified stressors Engaged pt in a self-compassion exercise  DSM-5 Diagnosis(es):  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 Patricia Lin has demonstrated progress in her goal as evidenced by increased awareness of hunger patterns, increased awareness of triggers for emotional eating behaviors, and reduction in binge eating behaviors. Patricia Lin also continues to demonstrate willingness to engage in learned skill(s).  Plan: The next appointment is scheduled for 10/27/2022 at 8:30am, which will be via MyChart Video Visit. The next session will focus on working towards the established treatment goal and termination planning.

## 2022-10-06 NOTE — Telephone Encounter (Signed)
Spoke with patient. Patient request to cancel surgery scheduled for 10/21/22 due to [facility] cost. Declines to reschedule at this time. States she is working with insurance provider to find in network provider. Patient will contact office if she needs assistance with records. Advised patient I will cancel surgery and post-op appts. Patient verbalizes understanding and is agreeable.   Spoke with Colletta Maryland in central scheduling, surgery cancelled.   Routing to Dr. Talbert Nan for final review.   Cc: Sheela Stack

## 2022-10-06 NOTE — Progress Notes (Signed)
[facility] [phone]   [phone]  OFFICE PROGRESS NOTE  Camillia Herter, NP [address] [place] [address]  DIAGNOSIS: Iron deficiency anemia secondary to menorrhagia.  PRIOR THERAPY: Ferrous sulfate 325 mg p.o. 3 times daily with no improvement.  CURRENT THERAPY: Venofer 500 Mg IV weekly for 2 weeks, last dose was giving 05/13/2022  INTERVAL HISTORY: Patricia Lin 41 y.o. female returns to the clinic today for follow-up visit.  The patient is feeling fine today with no concerning complaints except for mild fatigue.  She had her menstrual period last week.  She denied having any current chest pain, shortness of breath, cough or hemoptysis.  She has no nausea, vomiting, diarrhea or constipation.  She has no headache or visual changes.  She denied having any weight loss or night sweats.  She continues to tolerate the oral iron tablet fairly well.  She is here today for evaluation with repeat CBC, iron study and ferritin.  MEDICAL HISTORY: Past Medical History:  Diagnosis Date   Abnormal Pap smear    Adenomyosis    Anemia    Anxiety    Back pain    BV (bacterial vaginosis) 2011   Chlamydia infection    CIN I (cervical intraepithelial neoplasia I) 2008   Depression    Dysmenorrhea    Dyspnea    Dysrhythmia 2009   TACHYCARDIA;   HAD W/U 2010? 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  B/P nl, and 122/76 on repeat-sitting   Postpartum depression 2011   NO MEDS   Pregnancy induced hypertension    ALL PREGNANCIES   Shortness of breath    WITH EXERTION SICNE PREGNANCY   Tachycardia    Tachycardia 2010   HAS HAD WORKUP. UNSURE IF HAS POSSIBLE HEART VALVE ISSUE   Vulvitis 2009    ALLERGIES:  is allergic to latex.  MEDICATIONS:  Current Outpatient Medications  Medication Sig Dispense Refill   ferrous sulfate 325 (65 FE) MG tablet Take 1 tablet (325 mg total) by mouth 3 (three) times daily with meals. 90 tablet 3   metFORMIN (GLUCOPHAGE) 500 MG tablet Take 1 tablet (500 mg total) by mouth daily with breakfast. 30 tablet 0   Semaglutide-Weight Management (WEGOVY) 0.25 MG/0.5ML SOAJ Inject 0.25 mg into the skin once a week. 2 mL 0   No current facility-administered medications for this visit.    SURGICAL HISTORY:  Past Surgical History:  Procedure Laterality Date   BREAST REDUCTION SURGERY  12/08/2006   BREAST SURGERY     CERVICAL BIOPSY  W/ LOOP ELECTRODE EXCISION  2010   DILATION AND CURETTAGE  OF UTERUS     FINGER SURGERY     Right index finger   TUBAL LIGATION  01/09/2013   Procedure: POST PARTUM TUBAL LIGATION;  Surgeon: Betsy Coder, MD;  Location: [facility];  Service: Gynecology;  Laterality: Bilateral;  post partum tubal ligation bilateral   US ECHOCARDIOGRAPHY  06/18/2009   ef 55-60%   WISDOM TOOTH EXTRACTION      REVIEW OF SYSTEMS:  A comprehensive review of systems was negative except for: Constitutional: positive for fatigue   PHYSICAL EXAMINATION: General appearance: alert, cooperative, fatigued, and no distress Head: Normocephalic, without obvious abnormality, atraumatic Neck: no adenopathy, no JVD, supple, symmetrical, trachea midline, and thyroid not enlarged, symmetric, no tenderness/mass/nodules Lymph nodes: Cervical, supraclavicular, and axillary nodes  normal. Resp: clear to auscultation bilaterally Back: symmetric, no curvature. ROM normal. No CVA tenderness. Cardio: regular rate and rhythm, S1, S2 normal, no murmur, click, rub or gallop GI: soft, non-tender; bowel sounds normal; no masses,  no organomegaly Extremities: extremities normal, atraumatic, no cyanosis or edema  ECOG PERFORMANCE STATUS: 0 - Asymptomatic  Blood pressure (!) 125/92, pulse 67, temperature 98.2 F (36.8 C), temperature source Oral, resp. rate 16, weight 197 lb 1 oz (89.4 kg), last menstrual period 09/26/2022, SpO2 100 %.  LABORATORY DATA: Lab Results  Component Value Date   WBC 4.3 10/06/2022   HGB 11.3 (L) 10/06/2022   HCT 35.4 (L) 10/06/2022   MCV 85.7 10/06/2022   PLT 281 10/06/2022      Chemistry      Component Value Date/Time   NA 136 08/30/2022 1922   NA 140 05/02/2022 1000   K 3.8 08/30/2022 1922   CL 107 08/30/2022 1922   CO2 21 (L) 08/30/2022 1922   BUN 19 08/30/2022 1922   BUN 14 05/02/2022 1000   CREATININE 0.62 08/30/2022 1922   CREATININE 0.90 05/07/2022 1403   CREATININE 0.64 01/17/2013 1720      Component Value Date/Time   CALCIUM 9.4 08/30/2022 1922   ALKPHOS 69 08/30/2022 1922   AST 20 08/30/2022 1922   AST 24 05/07/2022 1403   ALT 27 08/30/2022 1922   ALT 27 05/07/2022 1403   BILITOT 0.9 08/30/2022 1922   BILITOT 0.6 05/07/2022 1403       RADIOGRAPHIC STUDIES: No results found.  ASSESSMENT AND PLAN: This is a very pleasant 41 years old African-American female with history of iron deficiency anemia secondary to menorrhagia and not responding to the oral iron tablets.  The patient was treated recently with iron infusion with Venofer 500 Mg IV weekly for 2 weeks. The patient is currently on oral ferrous sulfate and tolerating it fairly well. Repeat CBC today showed mild anemia with hemoglobin of 11.3 and hematocrit 35.4%.  Iron study and ferritin are still pending.  Her mild anemia could be secondary to the recent  menstruation. I recommended for the patient to continue on the oral iron tablet for now and I will see her back for follow-up visit in 3 months for evaluation and repeat blood work. If the pending iron studies showed significant iron deficiency, I will arrange for the patient to receive iron infusion with Venofer again in the interval. The patient was advised to call immediately if she has any concerning symptoms. The patient voices understanding of current disease status and treatment options and is in agreement with the current care plan.  All questions were answered. The patient knows to call the clinic with any problems, questions or concerns. We can certainly  see the patient much sooner if necessary.  The total time spent in the appointment was 20 minutes.  Disclaimer: This note was dictated with voice recognition software. Similar sounding words can inadvertently be transcribed and may not be corrected upon review.

## 2022-10-07 ENCOUNTER — Other Ambulatory Visit: Payer: Self-pay | Admitting: Internal Medicine

## 2022-10-07 LAB — FERRITIN: Ferritin: 6 ng/mL — ABNORMAL LOW (ref 11–307)

## 2022-10-09 ENCOUNTER — Telehealth: Payer: Self-pay | Admitting: Pharmacy Technician

## 2022-10-09 ENCOUNTER — Other Ambulatory Visit: Payer: Self-pay | Admitting: Pharmacy Technician

## 2022-10-09 NOTE — Telephone Encounter (Signed)
Auth Submission: NO AUTH NEEDED Payer: [employer] Medication & CPT/J Code(s) submitted: Venofer (Iron Sucrose) [id number] Route of submission (phone, fax, portal):  Phone # Fax # Auth type: Buy/Bill Units/visits requested: X3 Reference number:  Approval from: 10/09/22 to 12/07/22

## 2022-10-13 ENCOUNTER — Encounter (INDEPENDENT_AMBULATORY_CARE_PROVIDER_SITE_OTHER): Payer: Self-pay | Admitting: Family Medicine

## 2022-10-13 ENCOUNTER — Ambulatory Visit (INDEPENDENT_AMBULATORY_CARE_PROVIDER_SITE_OTHER): Payer: No Typology Code available for payment source | Admitting: Family Medicine

## 2022-10-13 VITALS — BP 136/92 | HR 83 | Temp 98.4°F | Ht 62.0 in | Wt 192.4 lb

## 2022-10-13 DIAGNOSIS — Z6835 Body mass index (BMI) 35.0-35.9, adult: Secondary | ICD-10-CM | POA: Diagnosis not present

## 2022-10-13 DIAGNOSIS — E611 Iron deficiency: Secondary | ICD-10-CM | POA: Diagnosis not present

## 2022-10-13 DIAGNOSIS — E669 Obesity, unspecified: Secondary | ICD-10-CM | POA: Diagnosis not present

## 2022-10-13 DIAGNOSIS — R7303 Prediabetes: Secondary | ICD-10-CM

## 2022-10-13 MED ORDER — METFORMIN HCL 500 MG PO TABS: ORAL_TABLET | ORAL | 0 refills | Status: DC

## 2022-10-13 MED ORDER — WEGOVY 0.25 MG/0.5ML ~~LOC~~ SOAJ: 0.2500 mg | SUBCUTANEOUS | 0 refills | Status: DC

## 2022-10-15 ENCOUNTER — Encounter: Payer: No Typology Code available for payment source | Admitting: Gastroenterology

## 2022-10-21 ENCOUNTER — Ambulatory Visit (INDEPENDENT_AMBULATORY_CARE_PROVIDER_SITE_OTHER): Payer: No Typology Code available for payment source

## 2022-10-21 ENCOUNTER — Ambulatory Visit (HOSPITAL_BASED_OUTPATIENT_CLINIC_OR_DEPARTMENT_OTHER)
Admission: RE | Admit: 2022-10-21 | Payer: No Typology Code available for payment source | Source: Ambulatory Visit | Admitting: Obstetrics and Gynecology

## 2022-10-21 ENCOUNTER — Encounter (HOSPITAL_BASED_OUTPATIENT_CLINIC_OR_DEPARTMENT_OTHER): Admission: RE | Payer: Self-pay | Source: Ambulatory Visit

## 2022-10-21 VITALS — BP 139/89 | HR 66 | Temp 98.4°F | Resp 18 | Ht 62.0 in | Wt 198.0 lb

## 2022-10-21 DIAGNOSIS — D500 Iron deficiency anemia secondary to blood loss (chronic): Secondary | ICD-10-CM | POA: Diagnosis not present

## 2022-10-21 SURGERY — HYSTERECTOMY, TOTAL, LAPAROSCOPIC, WITH BILATERAL SALPINGO-OOPHORECTOMY
Anesthesia: Choice

## 2022-10-21 MED ORDER — DIPHENHYDRAMINE HCL 25 MG PO CAPS
25.0000 mg | ORAL_CAPSULE | Freq: Once | ORAL | Status: AC
  Administered 2022-10-21: 25 mg via ORAL
  Filled 2022-10-21: qty 1

## 2022-10-21 MED ORDER — SODIUM CHLORIDE 0.9 % IV SOLN
300.0000 mg | INTRAVENOUS | Status: DC
  Administered 2022-10-21: 300 mg via INTRAVENOUS
  Filled 2022-10-21: qty 15

## 2022-10-21 MED ORDER — ACETAMINOPHEN 325 MG PO TABS
650.0000 mg | ORAL_TABLET | Freq: Once | ORAL | Status: AC
  Administered 2022-10-21: 650 mg via ORAL
  Filled 2022-10-21: qty 2

## 2022-10-21 NOTE — Progress Notes (Signed)
Diagnosis: Infusion  Provider:  Chilton Greathouse MD  Procedure: Infusion  IV Type: Peripheral, IV Location: R Antecubital  Venofer (Iron Sucrose), Dose: 300 mg  Infusion Start Time: 0907  Infusion Stop Time: 1045  Post Infusion IV Care: Peripheral IV Discontinued  Discharge: Condition: Good, Destination: Home . AVS provided to patient.   Performed by:  Loney Hering, LPN

## 2022-10-26 NOTE — Progress Notes (Unsigned)
Chief Complaint:   OBESITY Patricia Lin is here to discuss her progress with her obesity treatment plan along with follow-up of her obesity related diagnoses. Patricia Lin is on the Category 2 Plan and states she is following her eating plan approximately 99% of the time. Patricia Lin states she is walking 60 minutes 3 times per week.  Today's visit was #: 8 Starting weight: 203 lbs Starting date: 09/08/2018 Today's weight: 192 lbs Today's date: 10/13/2022 Total lbs lost to date: 11 Total lbs lost since last in-office visit: 2  Interim History: Patricia Lin attended [employer]. She used [facility]. Pt focused on eating more protein and ate on plan the whole week prior. [name] started pt on Wegovy 08/12/2022, but pt hasn't started it yet due to med being on backorder.  Subjective:   1. Prediabetes Pt's A1c was 5.2 on 10/01/2022. She is on Metformin and takes 1/2 tab with breakfast and 1/2 tab with lunch or dinner. Medication causes loose stools if pt takes a full tab.  2. Iron deficiency Patricia Lin is taking ferrous sulfate daily. Most recent CBC on 10/06/2022 revealed hemoglobin of 11.3 and hematocrit 35.4.  Assessment/Plan:  No orders of the defined types were placed in this encounter.   Medications Discontinued During This Encounter  Medication Reason   metFORMIN (GLUCOPHAGE) 500 MG tablet Reorder   Semaglutide-Weight Management (WEGOVY) 0.25 MG/0.5ML SOAJ Reorder     Meds ordered this encounter  Medications   metFORMIN (GLUCOPHAGE) 500 MG tablet    Sig: 1/2 tab with breakfast and 1/2 tab with lunch    Dispense:  30 tablet    Refill:  0   DISCONTD: Semaglutide-Weight Management (WEGOVY) 0.25 MG/0.5ML SOAJ    Sig: Inject 0.25 mg into the skin once a week.    Dispense:  2 mL    Refill:  0     1. Prediabetes Patricia Lin will continue to work on weight loss, exercise, and decreasing simple carbohydrates to help decrease the risk of diabetes.   Refill- metFORMIN (GLUCOPHAGE) 500 MG tablet; 1/2  tab with breakfast and 1/2 tab with lunch  Dispense: 30 tablet; Refill: 0  2. Iron deficiency Management per PCP, who obtained recent labs. continue prudent nutritional plan and increase iron rich foods.  3. Obesity, Current BMI 35.2 Patricia Lin is currently in the action stage of change. As such, her goal is to continue with weight loss efforts. She has agreed to the Category 2 Plan.   Starting dose sent to [employer]- Semaglutide-Weight Management (WEGOVY) 0.25 MG/0.5ML SOAJ; Inject 0.25 mg into the skin once a week.  Dispense: 2 mL; Refill: 0  Exercise goals:  As is  Behavioral modification strategies: holiday eating strategies .  Patricia Lin has agreed to follow-up with our clinic in 2 weeks, er pt preference. She was informed of the importance of frequent follow-up visits to maximize her success with intensive lifestyle modifications for her multiple health conditions.   Objective:   Blood pressure (!) 136/92, pulse 83, temperature 98.4 F (36.9 C), height 5\' 2"  (1.575 m), weight 192 lb 6.4 oz (87.3 kg), last menstrual period 09/26/2022, SpO2 94 %. Body mass index is 35.19 kg/m.  General: Cooperative, alert, well developed, in no acute distress. HEENT: Conjunctivae and lids unremarkable. Cardiovascular: Regular rhythm.  Lungs: Normal work of breathing. Neurologic: No focal deficits.   Lab Results  Component Value Date   CREATININE 0.62 08/30/2022   BUN 19 08/30/2022   NA 136 08/30/2022   K 3.8 08/30/2022   CL  107 08/30/2022   CO2 21 (L) 08/30/2022   Lab Results  Component Value Date   ALT 27 08/30/2022   AST 20 08/30/2022   ALKPHOS 69 08/30/2022   BILITOT 0.9 08/30/2022   Lab Results  Component Value Date   HGBA1C 5.2 10/01/2022   HGBA1C 5.5 05/02/2022   HGBA1C 5.2 09/08/2018   Lab Results  Component Value Date   INSULIN 15.1 06/11/2022   INSULIN 11.5 09/08/2018   Lab Results  Component Value Date   TSH 0.907 05/07/2022   Lab Results  Component Value Date   CHOL  184 05/02/2022   HDL 68 05/02/2022   LDLCALC 106 (H) 05/02/2022   TRIG 53 05/02/2022   CHOLHDL 2.7 05/02/2022   Lab Results  Component Value Date   VD25OH 22.2 (L) 06/11/2022   VD25OH 9.6 (L) 09/08/2018   Lab Results  Component Value Date   WBC 4.3 10/06/2022   HGB 11.3 (L) 10/06/2022   HCT 35.4 (L) 10/06/2022   MCV 85.7 10/06/2022   PLT 281 10/06/2022   Lab Results  Component Value Date   IRON 33 10/06/2022   TIBC 385 10/06/2022   FERRITIN 6 (L) 10/06/2022   Attestation Statements:   Reviewed by clinician on day of visit: allergies, medications, problem list, medical history, surgical history, family history, social history, and previous encounter notes.  I, Kyung Rudd, BS, CMA, am acting as transcriptionist for [employer], DO.   I have reviewed the above documentation for accuracy and completeness, and I agree with the above. Carlye Grippe, D.O.  The 21st Century Cures Act was signed into law in 2016 which includes the topic of electronic health records.  This provides immediate access to information in MyChart.  This includes consultation notes, operative notes, office notes, lab results and pathology reports.  If you have any questions about what you read please let us know at your next visit so we can discuss your concerns and take corrective action if need be.  We are right here with you.

## 2022-10-27 ENCOUNTER — Ambulatory Visit (INDEPENDENT_AMBULATORY_CARE_PROVIDER_SITE_OTHER): Payer: No Typology Code available for payment source | Admitting: Physician Assistant

## 2022-10-27 ENCOUNTER — Encounter: Payer: No Typology Code available for payment source | Admitting: Obstetrics and Gynecology

## 2022-10-27 ENCOUNTER — Telehealth (INDEPENDENT_AMBULATORY_CARE_PROVIDER_SITE_OTHER): Payer: No Typology Code available for payment source | Admitting: Family Medicine

## 2022-10-27 ENCOUNTER — Encounter (INDEPENDENT_AMBULATORY_CARE_PROVIDER_SITE_OTHER): Payer: Self-pay | Admitting: Family Medicine

## 2022-10-27 ENCOUNTER — Telehealth (INDEPENDENT_AMBULATORY_CARE_PROVIDER_SITE_OTHER): Payer: No Typology Code available for payment source | Admitting: Psychology

## 2022-10-27 DIAGNOSIS — R632 Polyphagia: Secondary | ICD-10-CM

## 2022-10-27 DIAGNOSIS — E669 Obesity, unspecified: Secondary | ICD-10-CM | POA: Diagnosis not present

## 2022-10-27 DIAGNOSIS — F331 Major depressive disorder, recurrent, moderate: Secondary | ICD-10-CM

## 2022-10-27 DIAGNOSIS — F5081 Binge eating disorder, mild: Secondary | ICD-10-CM

## 2022-10-27 DIAGNOSIS — E88819 Insulin resistance, unspecified: Secondary | ICD-10-CM | POA: Diagnosis not present

## 2022-10-27 DIAGNOSIS — Z6835 Body mass index (BMI) 35.0-35.9, adult: Secondary | ICD-10-CM

## 2022-10-27 MED ORDER — BD PEN NEEDLE NANO 2ND GEN 32G X 4 MM MISC: 0 refills | Status: DC

## 2022-10-27 MED ORDER — SAXENDA 18 MG/3ML ~~LOC~~ SOPN: 3.0000 mg | PEN_INJECTOR | Freq: Every day | SUBCUTANEOUS | 0 refills | Status: DC

## 2022-10-27 NOTE — Progress Notes (Signed)
TeleHealth Visit:  This visit was completed with telemedicine (audio/video) technology. Patricia Lin has verbally consented to this TeleHealth visit. The patient is located at home, the provider is located at home. The participants in this visit include the listed provider and patient. The visit was conducted today via phone call.  Unsuccessful attempt was made to connect to video. Length of call was 17 minutes.   OBESITY Patricia Lin is here to discuss her progress with her obesity treatment plan along with follow-up of her obesity related diagnoses.   Today's visit was # 9 Starting weight: 191 lbs Starting date: 09/08/2018 Weight at last in office visit: 192 lbs on 10/13/22 Total weight loss: 11 lbs at last in office visit on 10/13/22. Today's reported weight:  No weight reported.  Nutrition Plan: the Category 1 Plan.   Current exercise:  walking 60 minutes 3 times per week.  Interim History: Has been unable to obtain [facility] due to national drug shortage.  She has been following category 1 rather than category 2 plan.  She says category 2 plan has too much protein.  She says that although she does not always follow the plan exactly she follows elements of it at each meal. Recently she has been having some holiday meals with friends and has been off plan to some extent. She has protein in each meal.  Drinks only water or Crystal light.  On the weekends has a cocktail or 2.  Assessment/Plan:  1. Insulin Resistance with polyphagia Struggles with hunger.  Last fasting insulin was 15.1 on 06/11/2022.  A1c was 5.2 on 10/01/2022. Medication(s): Currently taking metformin 250 mg with breakfast and lunch. She is amenable to trying Saxenda even though it is a daily injection.  Edelin denies personal or family history of thyroid cancer, history of pancreatitis, or current cholelithiasis. [name] was informed of the most common side effects (nausea, constipation, diarrhea). Patient informed to watch for  possible symptoms, such as a lump or swelling in the neck, hoarseness, trouble swallowing, or shortness of breath. If you have any of these symptoms, tell your healthcare provide  Lab Results  Component Value Date   HGBA1C 5.2 10/01/2022   Lab Results  Component Value Date   INSULIN 15.1 06/11/2022   INSULIN 11.5 09/08/2018    Plan New prescription for Saxenda 3.0 mg daily. Titration instructions sent via MyChart: "Start with 0.6 mg daily. If after 1 week you do not have any side effects (nausea, vomiting, diarrhea, constipation), you may increase dose to 1.2 mg. Hold at 1.2 mg daily until you see Dr. Val Eagle."  May discontinue metformin once Saxenda is started.  2.  Binge eating disorder, mild Patient reports she has been doing some stress eating but does not feel she is binge eating currently.  She has been seeing Dr. Dewaine Conger and has a follow-up on 11/10/2022.  Plan: Continue to work on getting an adequate protein. Follow-up with Dr. Dewaine Conger on 11/10/2022.  3. Obesity: Current BMI 35.2 [name] is currently in the action stage of change. As such, her goal is to continue with weight loss efforts.  She has agreed to the Category 1 Plan.   Exercise goals:  as is  Behavioral modification strategies: increasing lean protein intake, decreasing simple carbohydrates, decreasing liquid calories, and planning for success.  [name] has agreed to follow-up with our clinic in 3-4 weeks.   No orders of the defined types were placed in this encounter.   Medications Discontinued During This Encounter  Medication Reason  Semaglutide-Weight Management (WEGOVY) 0.25 MG/0.5ML SOAJ National Drug Shortage     Meds ordered this encounter  Medications   Liraglutide -Weight Management (SAXENDA) 18 MG/3ML SOPN    Sig: Inject 3 mg into the skin daily.    Dispense:  15 mL    Refill:  0    Order Specific Question:   Supervising Provider    Answer:   Carolin Sicks   Insulin Pen Needle (BD PEN  NEEDLE NANO 2ND GEN) 32G X 4 MM MISC    Sig: Use 1 needle daily to inject medication.    Dispense:  100 each    Refill:  0    Order Specific Question:   Supervising Provider    Answer:   Glennis Brink [[id number]]      Objective:   VITALS: Per patient if applicable, see vitals. GENERAL: Alert and in no acute distress. CARDIOPULMONARY: No increased WOB. Speaking in clear sentences.  PSYCH: Pleasant and cooperative. Speech normal rate and rhythm. Affect is appropriate. Insight and judgement are appropriate. Attention is focused, linear, and appropriate.  NEURO: Oriented as arrived to appointment on time with no prompting.   Lab Results  Component Value Date   CREATININE 0.62 08/30/2022   BUN 19 08/30/2022   NA 136 08/30/2022   K 3.8 08/30/2022   CL 107 08/30/2022   CO2 21 (L) 08/30/2022   Lab Results  Component Value Date   ALT 27 08/30/2022   AST 20 08/30/2022   ALKPHOS 69 08/30/2022   BILITOT 0.9 08/30/2022   Lab Results  Component Value Date   HGBA1C 5.2 10/01/2022   HGBA1C 5.5 05/02/2022   HGBA1C 5.2 09/08/2018   Lab Results  Component Value Date   INSULIN 15.1 06/11/2022   INSULIN 11.5 09/08/2018   Lab Results  Component Value Date   TSH 0.907 05/07/2022   Lab Results  Component Value Date   CHOL 184 05/02/2022   HDL 68 05/02/2022   LDLCALC 106 (H) 05/02/2022   TRIG 53 05/02/2022   CHOLHDL 2.7 05/02/2022   Lab Results  Component Value Date   WBC 4.3 10/06/2022   HGB 11.3 (L) 10/06/2022   HCT 35.4 (L) 10/06/2022   MCV 85.7 10/06/2022   PLT 281 10/06/2022   Lab Results  Component Value Date   IRON 33 10/06/2022   TIBC 385 10/06/2022   FERRITIN 6 (L) 10/06/2022   Lab Results  Component Value Date   VD25OH 22.2 (L) 06/11/2022   VD25OH 9.6 (L) 09/08/2018    Attestation Statements:   Reviewed by clinician on day of visit: allergies, medications, problem list, medical history, surgical history, family history, social history, and previous encounter  notes.

## 2022-10-27 NOTE — Progress Notes (Signed)
Office: [phone]  /  Fax: [phone]    Date: October 27, 2022    Appointment Start Time: 8:31am Duration: 23 minutes Provider: Lawerance Cruel, Psy.D. Type of Session: Individual Therapy  Location of Patient: Home (private location) Location of Provider: Provider's Home (private office) Type of Contact: Telepsychological Visit via MyChart Video Visit  Session Content: Patricia Lin is a 41 y.o. female presenting for a follow-up appointment to address the previously established treatment goal of increasing coping skills.Today's appointment was a telepsychological visit. Patricia Lin provided verbal consent for today's telepsychological appointment and she is aware she is responsible for securing confidentiality on her end of the session. Prior to proceeding with today's appointment, Patricia Lin's physical location at the time of this appointment was obtained as well a phone number she could be reached at in the event of technical difficulties. Patricia Lin and this provider participated in today's telepsychological service. Of note, today's appointment was switched to a regular telephone call at 8:35am with Patricia Lin's verbal consent due to technical issues on her end.   This provider conducted a brief check-in. Patricia Lin stated, "I've been busy." She shared she continues to lose weight, and denied engagement in binge eating behaviors; however, described some instances of emotional eating behaviors due to stress with the holidays and being let go from her employment last week. Today's appointment focused on coping with stress. She described engaging in various self-care activities (e.g., decorating). Positive reinforcement was provided. This provider continued to recommend traditional therapeutic services and discussed sliding scale fee options given recent loss of employment; however, Patricia Lin declined additional referral options, noting she has the previously shared referral options. Psychoeducation regarding mindfulness  was provided. A handout was provided to Patricia Lin with further information regarding mindfulness, including exercises. This provider also explained the benefit of mindfulness as it relates to emotional eating. Patricia Lin was encouraged to engage in the provided exercises between now and the next appointment with this provider. Patricia Lin agreed. During today's appointment, Patricia Lin was led through a mindfulness exercise involving her senses. Furthermore, Patricia Lin denied experiencing suicidal, self-injurious, and homicidal ideation, plan, or intent since the last appointment with this provider and described future plans that include ongoing efforts to improve her well-being and eating habits. Overall, Patricia Lin was receptive to today's appointment as evidenced by openness to sharing, responsiveness to feedback, and willingness to engage in mindfulness exercises to assist with coping.  Mental Status Examination:  Appearance: neat Behavior: appropriate to circumstances Mood: neutral Affect: mood congruent Speech: WNL Eye Contact: appropriate Psychomotor Activity: WNL Gait: unable to assess Thought Process: linear, logical, and goal directed and denies suicidal, homicidal, and self-harm ideation, plan and intent  Thought Content/Perception: no hallucinations, delusions, bizarre thinking or behavior endorsed or observed Orientation: AAOx4 Memory/Concentration: memory, attention, language, and fund of knowledge intact  Insight: fair Judgment: fair  Interventions:  Conducted a brief chart review Conducted a risk assessment Provided empathic reflections and validation Employed supportive psychotherapy interventions to facilitate reduced distress and to improve coping skills with identified stressors Psychoeducation provided regarding mindfulness Recommended/discussed options for longer-term therapeutic services  DSM-5 Diagnosis(es):  F50.81 Binge-Eating Disorder, Mild and F33.1 Major Depressive Disorder, Recurrent  Episode, Moderate, With Anxious Distress   Treatment Goal & Progress: During the initial appointment with this provider, the following treatment goal was established: increase coping skills. Patricia Lin has demonstrated progress in her goal as evidenced by increased awareness of hunger patterns, increased awareness of triggers for emotional eating behaviors, and reduction in binge eating behaviors. Patricia Lin also continues to demonstrate willingness  to engage in learned skill(s).  Plan: The next appointment is scheduled for 11/10/2022 at 10am, which will be via MyChart Video Visit. The next session will focus on working towards the established treatment goal and discussing termination planning, as it was not discussed today due to ongoing stressors and subsequent emotional eating behaviors.

## 2022-10-28 ENCOUNTER — Other Ambulatory Visit (INDEPENDENT_AMBULATORY_CARE_PROVIDER_SITE_OTHER): Payer: Self-pay | Admitting: Family Medicine

## 2022-10-28 ENCOUNTER — Ambulatory Visit (INDEPENDENT_AMBULATORY_CARE_PROVIDER_SITE_OTHER): Payer: No Typology Code available for payment source | Admitting: *Deleted

## 2022-10-28 ENCOUNTER — Telehealth: Payer: Self-pay

## 2022-10-28 ENCOUNTER — Other Ambulatory Visit (HOSPITAL_COMMUNITY): Payer: Self-pay

## 2022-10-28 VITALS — BP 127/84 | HR 81 | Temp 98.1°F | Resp 20 | Ht 62.0 in | Wt 195.8 lb

## 2022-10-28 DIAGNOSIS — E669 Obesity, unspecified: Secondary | ICD-10-CM

## 2022-10-28 DIAGNOSIS — D500 Iron deficiency anemia secondary to blood loss (chronic): Secondary | ICD-10-CM | POA: Diagnosis not present

## 2022-10-28 DIAGNOSIS — E88819 Insulin resistance, unspecified: Secondary | ICD-10-CM

## 2022-10-28 MED ORDER — ACETAMINOPHEN 325 MG PO TABS
650.0000 mg | ORAL_TABLET | Freq: Once | ORAL | Status: AC
  Administered 2022-10-28: 650 mg via ORAL
  Filled 2022-10-28: qty 2

## 2022-10-28 MED ORDER — DIPHENHYDRAMINE HCL 25 MG PO CAPS
25.0000 mg | ORAL_CAPSULE | Freq: Once | ORAL | Status: AC
  Administered 2022-10-28: 25 mg via ORAL
  Filled 2022-10-28: qty 1

## 2022-10-28 MED ORDER — SODIUM CHLORIDE 0.9 % IV SOLN
300.0000 mg | INTRAVENOUS | Status: DC
  Administered 2022-10-28: 300 mg via INTRAVENOUS
  Filled 2022-10-28: qty 15

## 2022-10-28 NOTE — Progress Notes (Signed)
Diagnosis: Iron Deficiency Anemia  Provider:  Chilton Greathouse MD  Procedure: Infusion  IV Type: Peripheral, IV Location: L Antecubital  Venofer (Iron Sucrose), Dose: 300 mg  Infusion Start Time: 0907 am  Infusion Stop Time: 1048 am  Post Infusion IV Care: Observation period completed and Peripheral IV Discontinued  Discharge: Condition: Good, Destination: Home . AVS provided to patient.   Performed by:  Forrest Moron, RN

## 2022-10-28 NOTE — Telephone Encounter (Signed)
Prior Auth for Patricia Lin has been approved from 10/27/2022 to 02/24/2023. Patient has been notified.

## 2022-11-03 ENCOUNTER — Encounter: Payer: No Typology Code available for payment source | Admitting: Obstetrics and Gynecology

## 2022-11-04 ENCOUNTER — Ambulatory Visit (INDEPENDENT_AMBULATORY_CARE_PROVIDER_SITE_OTHER): Payer: No Typology Code available for payment source

## 2022-11-04 VITALS — BP 134/87 | HR 68 | Temp 97.8°F | Resp 16 | Ht 62.0 in | Wt 197.2 lb

## 2022-11-04 DIAGNOSIS — D500 Iron deficiency anemia secondary to blood loss (chronic): Secondary | ICD-10-CM | POA: Diagnosis not present

## 2022-11-04 MED ORDER — SODIUM CHLORIDE 0.9 % IV SOLN
300.0000 mg | INTRAVENOUS | Status: DC
  Administered 2022-11-04: 300 mg via INTRAVENOUS
  Filled 2022-11-04: qty 15

## 2022-11-04 MED ORDER — DIPHENHYDRAMINE HCL 25 MG PO CAPS
25.0000 mg | ORAL_CAPSULE | Freq: Once | ORAL | Status: AC
  Administered 2022-11-04: 25 mg via ORAL
  Filled 2022-11-04: qty 1

## 2022-11-04 MED ORDER — ACETAMINOPHEN 325 MG PO TABS
650.0000 mg | ORAL_TABLET | Freq: Once | ORAL | Status: AC
  Administered 2022-11-04: 650 mg via ORAL
  Filled 2022-11-04: qty 2

## 2022-11-04 NOTE — Progress Notes (Signed)
Diagnosis: Iron Deficiency Anemia  Provider:  Chilton Greathouse MD  Procedure: Infusion  IV Type: Peripheral, IV Location: R Antecubital  Venofer (Iron Sucrose), Dose: 300 mg  Infusion Start Time: 0940  Infusion Stop Time: 1134  Post Infusion IV Care: Peripheral IV Discontinued  Discharge: Condition: Good, Destination: Home . AVS provided to patient.   Performed by:  Nat Math, RN

## 2022-11-05 ENCOUNTER — Other Ambulatory Visit (INDEPENDENT_AMBULATORY_CARE_PROVIDER_SITE_OTHER): Payer: Self-pay | Admitting: Family Medicine

## 2022-11-05 DIAGNOSIS — E669 Obesity, unspecified: Secondary | ICD-10-CM

## 2022-11-05 DIAGNOSIS — E88819 Insulin resistance, unspecified: Secondary | ICD-10-CM

## 2022-11-10 ENCOUNTER — Other Ambulatory Visit (HOSPITAL_COMMUNITY): Payer: Self-pay

## 2022-11-10 ENCOUNTER — Telehealth (INDEPENDENT_AMBULATORY_CARE_PROVIDER_SITE_OTHER): Payer: No Typology Code available for payment source | Admitting: Psychology

## 2022-11-10 ENCOUNTER — Ambulatory Visit (INDEPENDENT_AMBULATORY_CARE_PROVIDER_SITE_OTHER): Payer: No Typology Code available for payment source | Admitting: Family Medicine

## 2022-11-10 DIAGNOSIS — F5081 Binge eating disorder: Secondary | ICD-10-CM | POA: Diagnosis not present

## 2022-11-10 DIAGNOSIS — F331 Major depressive disorder, recurrent, moderate: Secondary | ICD-10-CM | POA: Diagnosis not present

## 2022-11-10 NOTE — Progress Notes (Signed)
Office: [phone]  /  Fax: [phone]    Date: November 10, 2022    Appointment Start Time: 10:02am Duration: 25 minutes Provider: Lawerance Cruel, Psy.D. Type of Session: Individual Therapy  Location of Patient: Home (private location) Location of Provider: Provider's Home (private office) Type of Contact: Telepsychological Visit via MyChart Video Visit  Session Content: Patricia Lin is a 41 y.o. female presenting for a follow-up appointment to address the previously established treatment goal of increasing coping skills.Today's appointment was a telepsychological visit. Patricia Lin provided verbal consent for today's telepsychological appointment and she is aware she is responsible for securing confidentiality on her end of the session. Prior to proceeding with today's appointment, Patricia Lin's physical location at the time of this appointment was obtained as well a phone number she could be reached at in the event of technical difficulties. Patricia Lin and this provider participated in today's telepsychological service.   This provider conducted a brief check-in. Patricia Lin stated she is not following her structured meal plan, but denied engagement in emotional and binge eating behaviors. 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 Notably, she stated a plan to resume her prescribed structured meal plan on the first of the year due to being busy, but discussed making better choices and engaging in portion control. This was further explored and processed. Psychoeducation provided regarding all or nothing. She was also engaged in problem solving to continue making choices congruent to her structured meal plan and goal(s) with the clinic (e.g., take pictures of the eating out handout for consistent access; take protein snacks and foods when working; adapt previously discussed strategies when working at an event/party). Moreover, Patricia Lin denied experiencing suicidal,  self-injurious, and homicidal ideation, plan, or intent since the last appointment with this provider and described future plans that include ongoing efforts to improve her well-being and eating habits. Furthermore, termination planning was discussed. Patricia Lin was receptive to a follow-up appointment in 3-4 weeks and an additional follow-up/termination appointment in 3-4 weeks after that. Overall, Patricia Lin was receptive to today's appointment as evidenced by openness to sharing, responsiveness to feedback, and willingness to implement discussed strategies .  Mental Status Examination:  Appearance: neat Behavior: appropriate to circumstances Mood: neutral Affect: mood congruent Speech: WNL Eye Contact: appropriate Psychomotor Activity: WNL Gait: unable to assess Thought Process: linear, logical, and goal directed and denies suicidal, homicidal, and self-harm ideation, plan and intent  Thought Content/Perception: no hallucinations, delusions, bizarre thinking or behavior endorsed or observed Orientation: AAOx4 Memory/Concentration: memory, attention, language, and fund of knowledge intact  Insight: fair Judgment: fair  Interventions:  Conducted a brief chart review Conducted a risk assessment Provided empathic reflections and validation Provided positive reinforcement Employed supportive psychotherapy interventions to facilitate reduced distress and to improve coping skills with identified stressors Engaged patient in problem solving Discussed termination planning  DSM-5 Diagnosis(es): F50.81 Binge-Eating Disorder, In Full Remission and F33.1 Major Depressive Disorder, Recurrent Episode, Moderate, With Anxious Distress   Treatment Goal & Progress: During the initial appointment with this provider, the following treatment goal was established: increase coping skills. Patricia Lin has demonstrated progress in her goal as evidenced by increased awareness of hunger patterns, increased awareness of  triggers for emotional eating behaviors, and reduction in binge eating behaviors. Patricia Lin also continues to demonstrate willingness to engage in learned skill(s).   Plan: The next appointment is scheduled for 12/02/2022 at 8:30am, which will be via MyChart Video Visit. The next session will focus on working towards  the established treatment goal.

## 2022-11-11 ENCOUNTER — Other Ambulatory Visit (HOSPITAL_COMMUNITY): Payer: Self-pay

## 2022-11-11 ENCOUNTER — Ambulatory Visit (INDEPENDENT_AMBULATORY_CARE_PROVIDER_SITE_OTHER): Payer: No Typology Code available for payment source | Admitting: Physician Assistant

## 2022-11-11 ENCOUNTER — Encounter (INDEPENDENT_AMBULATORY_CARE_PROVIDER_SITE_OTHER): Payer: Self-pay | Admitting: Physician Assistant

## 2022-11-11 VITALS — BP 121/85 | HR 69 | Temp 98.0°F | Ht 62.0 in | Wt 190.0 lb

## 2022-11-11 DIAGNOSIS — E611 Iron deficiency: Secondary | ICD-10-CM

## 2022-11-11 DIAGNOSIS — R7303 Prediabetes: Secondary | ICD-10-CM

## 2022-11-11 DIAGNOSIS — E559 Vitamin D deficiency, unspecified: Secondary | ICD-10-CM

## 2022-11-11 DIAGNOSIS — E7849 Other hyperlipidemia: Secondary | ICD-10-CM | POA: Diagnosis not present

## 2022-11-11 DIAGNOSIS — E88819 Insulin resistance, unspecified: Secondary | ICD-10-CM

## 2022-11-11 DIAGNOSIS — E66811 Obesity, class 1: Secondary | ICD-10-CM

## 2022-11-11 DIAGNOSIS — Z6834 Body mass index (BMI) 34.0-34.9, adult: Secondary | ICD-10-CM

## 2022-11-11 DIAGNOSIS — E669 Obesity, unspecified: Secondary | ICD-10-CM

## 2022-11-11 MED ORDER — BD PEN NEEDLE NANO 2ND GEN 32G X 4 MM MISC
0 refills | Status: AC
  Filled 2022-11-11 – 2023-01-13 (×3): qty 100, 90d supply, fill #0
  Filled 2023-01-16: qty 100, 34d supply, fill #0

## 2022-11-11 MED ORDER — METFORMIN HCL 500 MG PO TABS: ORAL_TABLET | ORAL | 0 refills | Status: DC

## 2022-11-11 MED ORDER — SAXENDA 18 MG/3ML ~~LOC~~ SOPN
3.0000 mg | PEN_INJECTOR | Freq: Every day | SUBCUTANEOUS | 0 refills | Status: DC
  Filled 2022-11-11: qty 15, 30d supply, fill #0

## 2022-11-11 MED ORDER — SAXENDA 18 MG/3ML ~~LOC~~ SOPN
3.0000 mg | PEN_INJECTOR | Freq: Every day | SUBCUTANEOUS | 0 refills | Status: DC
  Filled 2022-11-11 – 2023-01-16 (×5): qty 15, 30d supply, fill #0

## 2022-11-12 ENCOUNTER — Other Ambulatory Visit (HOSPITAL_COMMUNITY): Payer: Self-pay

## 2022-11-12 LAB — CMP14+EGFR
ALT: 32 IU/L (ref 0–32)
AST: 23 IU/L (ref 0–40)
Albumin/Globulin Ratio: 1.6 (ref 1.2–2.2)
Albumin: 4.6 g/dL (ref 3.9–4.9)
Alkaline Phosphatase: 77 IU/L (ref 44–121)
BUN/Creatinine Ratio: 17 (ref 9–23)
BUN: 13 mg/dL (ref 6–24)
Bilirubin Total: 0.6 mg/dL (ref 0.0–1.2)
CO2: 22 mmol/L (ref 20–29)
Calcium: 9.7 mg/dL (ref 8.7–10.2)
Chloride: 103 mmol/L (ref 96–106)
Creatinine, Ser: 0.75 mg/dL (ref 0.57–1.00)
Globulin, Total: 2.8 g/dL (ref 1.5–4.5)
Glucose: 88 mg/dL (ref 70–99)
Potassium: 4.2 mmol/L (ref 3.5–5.2)
Sodium: 139 mmol/L (ref 134–144)
Total Protein: 7.4 g/dL (ref 6.0–8.5)
eGFR: 102 mL/min/{1.73_m2} (ref >59)

## 2022-11-12 LAB — IRON AND TIBC
Iron Saturation: 26 % (ref 15–55)
Iron: 82 ug/dL (ref 27–159)
Total Iron Binding Capacity: 310 ug/dL (ref 250–450)
UIBC: 228 ug/dL (ref 131–425)

## 2022-11-12 LAB — LIPID PANEL WITH LDL/HDL RATIO
Cholesterol, Total: 163 mg/dL (ref 100–199)
HDL: 58 mg/dL (ref >39)
LDL Chol Calc (NIH): 92 mg/dL (ref 0–99)
LDL/HDL Ratio: 1.6 ratio (ref 0.0–3.2)
Triglycerides: 65 mg/dL (ref 0–149)
VLDL Cholesterol Cal: 13 mg/dL (ref 5–40)

## 2022-11-12 LAB — TSH: TSH: 0.923 u[IU]/mL (ref 0.450–4.500)

## 2022-11-12 LAB — VITAMIN B12: Vitamin B-12: 794 pg/mL (ref 232–1245)

## 2022-11-12 LAB — INSULIN, RANDOM: INSULIN: 11.7 u[IU]/mL (ref 2.6–24.9)

## 2022-11-12 LAB — HEMOGLOBIN A1C
Est. average glucose Bld gHb Est-mCnc: 103 mg/dL
Hgb A1c MFr Bld: 5.2 % (ref 4.8–5.6)

## 2022-11-12 LAB — FOLATE: Folate: 6.4 ng/mL (ref >3.0)

## 2022-11-12 LAB — VITAMIN D 25 HYDROXY (VIT D DEFICIENCY, FRACTURES): Vit D, 25-Hydroxy: 23.2 ng/mL — ABNORMAL LOW (ref 30.0–100.0)

## 2022-11-16 ENCOUNTER — Other Ambulatory Visit: Payer: Self-pay | Admitting: Internal Medicine

## 2022-11-16 DIAGNOSIS — D509 Iron deficiency anemia, unspecified: Secondary | ICD-10-CM

## 2022-11-19 ENCOUNTER — Encounter: Payer: No Typology Code available for payment source | Admitting: Obstetrics and Gynecology

## 2022-11-20 ENCOUNTER — Encounter (INDEPENDENT_AMBULATORY_CARE_PROVIDER_SITE_OTHER): Payer: Self-pay | Admitting: Physician Assistant

## 2022-11-20 ENCOUNTER — Ambulatory Visit (INDEPENDENT_AMBULATORY_CARE_PROVIDER_SITE_OTHER): Payer: No Typology Code available for payment source | Admitting: Physician Assistant

## 2022-11-20 VITALS — BP 133/91 | HR 71 | Temp 98.3°F | Ht 62.0 in | Wt 189.0 lb

## 2022-11-20 DIAGNOSIS — E669 Obesity, unspecified: Secondary | ICD-10-CM

## 2022-11-20 DIAGNOSIS — E559 Vitamin D deficiency, unspecified: Secondary | ICD-10-CM | POA: Diagnosis not present

## 2022-11-20 DIAGNOSIS — E88819 Insulin resistance, unspecified: Secondary | ICD-10-CM

## 2022-11-20 DIAGNOSIS — Z6834 Body mass index (BMI) 34.0-34.9, adult: Secondary | ICD-10-CM

## 2022-11-20 MED ORDER — VITAMIN D (ERGOCALCIFEROL) 1.25 MG (50000 UNIT) PO CAPS: 50000.0000 [IU] | ORAL_CAPSULE | ORAL | 0 refills | Status: DC

## 2022-11-24 ENCOUNTER — Telehealth: Payer: Self-pay | Admitting: Internal Medicine

## 2022-11-24 NOTE — Progress Notes (Unsigned)
Chief Complaint:   OBESITY Patricia Lin is here to discuss her progress with her obesity treatment plan along with follow-up of her obesity related diagnoses. Patricia Lin is on the Category 1 Plan and states she is following her eating plan approximately 50% of the time. Patricia Lin states she is exercising 0 minutes 0 times per week.  Today's visit was #: 10 Starting weight: 191 lbs Starting date: 09/08/2018 Today's weight: 190 lbs Today's date: 11/11/2022 Total lbs lost to date: 1 lb Total lbs lost since last in-office visit: 2   Interim History: Patricia Lin has done well with weight loss.  Getting bored with dinner meals-discussed options for dinner meal and calories/protein goal/protein options for dinner.  Has not started Saxenda yet as out of stock at [facility].  Subjective:   1. Prediabetes Taking metformin 250 mg twice a day.  No side effects.  A1c of 5.2- at goal.   2. Vitamin D deficiency Vitamin D of 22.2 on 06/11/22- Not at goal.  Reports not taking vitamin D currently.  3. Other hyperlipidemia Not on statin-working on healthy eating plan to decrease simple carbohydrates, decrease saturated fats, and exercise to promote weight loss. HDL 68, Triglyceride 53, LDL 106 on 05/02/22. LDL not at goal.    4. Iron deficiency Taking ferrous sulfate 325 mg 3 times a day-notes continued fatigue.   Last Hgb of 11.3/HCT of 35.4/ferritin 6/Fe Sat of 9 on 10/06/22.   5. Insulin resistance with polyphagia Taking metformin 250 mg BID with no side effects.  To start GLP-1 (Saxenda).  Assessment/Plan:   1. Prediabetes We will obtain labs today.  Continue Metformin, start GLP-1, Saxenda. Continue healthy eating plan and exercise.   - CMP14+EGFR - Hemoglobin A1c - Insulin, random - TSH  - Refill metFORMIN (GLUCOPHAGE) 500 MG tablet; 1/2 tab with breakfast and 1/2 tab with lunch  Dispense: 90 tablet; Refill: 0  2. Vitamin D deficiency We will obtain labs today. Supplement as appropriate.  - VITAMIN D  25 Hydroxy (Vit-D Deficiency, Fractures)  3. Other hyperlipidemia We will obtain labs today. Continue healthy eating plan and exercise.  - Lipid Panel With LDL/HDL Ratio  4. Iron deficiency We will obtain labs today.  - Vitamin B12 - Folate - Iron and TIBC  5. Insulin resistance with polyphagia We will obtain labs today. Continue Metformin, start Saxenda. Continue healthy eating plan to decrease simple carbohydrates, increase protein and exercise.  6. Obesity: Current BMI 34.8 We will refill Saxenda 3 mg SQ once daily for 1 month with 0 refills. Take 0.6 mg SQ daily for 1 week then increase to 1.2 mg daily. Need to follow up prior to insurance change over the next 2 weeks.  -Refill Liraglutide -Weight Management (SAXENDA) 18 MG/3ML SOPN; Inject 3 mg into the skin daily.  Dispense: 15 mL; Refill: 0  -Refill Insulin Pen Needle (BD PEN NEEDLE NANO 2ND GEN) 32G X 4 MM MISC; Use 1 needle daily to inject medication.  Dispense: 100 each; Refill: 0  Patricia Lin is currently in the action stage of change. As such, her goal is to continue with weight loss efforts. She has agreed to the Category 1 Plan and keeping a food journal and adhering to recommended goals of 300-400 calories and 35+ grams of protein at supper..   Exercise goals: All adults should avoid inactivity. Some physical activity is better than none, and adults who participate in any amount of physical activity gain some health benefits.  Behavioral modification strategies: increasing lean protein  intake, meal planning and cooking strategies, planning for success, and keeping a strict food journal.  [place] has agreed to follow-up with our clinic in 2 weeks. She was informed of the importance of frequent follow-up visits to maximize her success with intensive lifestyle modifications for her multiple health conditions.   Patricia Lin was informed we would discuss her lab results at her next visit unless there is a critical issue that needs to  be addressed sooner. Patricia Lin agreed to keep her next visit at the agreed upon time to discuss these results.  Objective:   Blood pressure 121/85, pulse 69, temperature 98 F (36.7 C), height _0  (1.575 m), weight 190 lb (86.2 kg), SpO2 100 %. Body mass index is 34.75 kg/m.  General: Cooperative, alert, well developed, in no acute distress. HEENT: Conjunctivae and lids unremarkable. Cardiovascular: Regular rhythm.  Lungs: Normal work of breathing. Neurologic: No focal deficits.   Lab Results  Component Value Date   CREATININE 0.75 11/11/2022   BUN 13 11/11/2022   NA 139 11/11/2022   K 4.2 11/11/2022   CL 103 11/11/2022   CO2 22 11/11/2022   Lab Results  Component Value Date   ALT 32 11/11/2022   AST 23 11/11/2022   ALKPHOS 77 11/11/2022   BILITOT 0.6 11/11/2022   Lab Results  Component Value Date   HGBA1C 5.2 11/11/2022   HGBA1C 5.2 10/01/2022   HGBA1C 5.5 05/02/2022   HGBA1C 5.2 09/08/2018   Lab Results  Component Value Date   INSULIN 11.7 11/11/2022   INSULIN 15.1 06/11/2022   INSULIN 11.5 09/08/2018   Lab Results  Component Value Date   TSH 0.923 11/11/2022   Lab Results  Component Value Date   CHOL 163 11/11/2022   HDL 58 11/11/2022   LDLCALC 92 11/11/2022   TRIG 65 11/11/2022   CHOLHDL 2.7 05/02/2022   Lab Results  Component Value Date   VD25OH 23.2 (L) 11/11/2022   VD25OH 22.2 (L) 06/11/2022   VD25OH 9.6 (L) 09/08/2018   Lab Results  Component Value Date   WBC 4.3 10/06/2022   HGB 11.3 (L) 10/06/2022   HCT 35.4 (L) 10/06/2022   MCV 85.7 10/06/2022   PLT 281 10/06/2022   Lab Results  Component Value Date   IRON 82 11/11/2022   TIBC 310 11/11/2022   FERRITIN 6 (L) 10/06/2022   Attestation Statements:   Reviewed by clinician on day of visit: allergies, medications, problem list, medical history, surgical history, family history, social history, and previous encounter notes.  I, Brendell Tyus, am acting as transcriptionist for [employer].  I have reviewed the above documentation for accuracy and completeness, and I agree with the above. -  Izadora Roehr,PA-C

## 2022-11-24 NOTE — Telephone Encounter (Signed)
Called patient regarding upcoming January appointments, patient is notified. 

## 2022-11-28 ENCOUNTER — Ambulatory Visit
Admission: EM | Admit: 2022-11-28 | Discharge: 2022-11-28 | Disposition: A | Discharge status: Home / Self Care | Payer: No Typology Code available for payment source | Attending: Urgent Care | Admitting: Urgent Care

## 2022-11-28 ENCOUNTER — Encounter: Payer: Self-pay | Admitting: Urgent Care

## 2022-11-28 DIAGNOSIS — Z20828 Contact with and (suspected) exposure to other viral communicable diseases: Secondary | ICD-10-CM

## 2022-11-28 DIAGNOSIS — B349 Viral infection, unspecified: Secondary | ICD-10-CM

## 2022-11-28 MED ORDER — PSEUDOEPHEDRINE HCL 60 MG PO TABS: 60.0000 mg | ORAL_TABLET | Freq: Three times a day (TID) | ORAL | 0 refills | Status: DC | PRN

## 2022-11-28 MED ORDER — BENZONATATE 100 MG PO CAPS: 100.0000 mg | ORAL_CAPSULE | Freq: Three times a day (TID) | ORAL | 0 refills | Status: DC | PRN

## 2022-11-28 MED ORDER — CETIRIZINE HCL 10 MG PO TABS: 10.0000 mg | ORAL_TABLET | Freq: Every day | ORAL | 0 refills | Status: DC

## 2022-11-28 MED ORDER — OSELTAMIVIR PHOSPHATE 6 MG/ML PO SUSR: 75.0000 mg | Freq: Two times a day (BID) | ORAL | 0 refills | Status: DC

## 2022-11-28 MED ORDER — PROMETHAZINE-DM 6.25-15 MG/5ML PO SYRP: 5.0000 mL | ORAL_SOLUTION | Freq: Three times a day (TID) | ORAL | 0 refills | Status: DC | PRN

## 2022-11-28 NOTE — ED Provider Notes (Signed)
[facility]  Note:  This document was prepared using Conservation officer, historic buildings and may include unintentional dictation errors.  MRN: [id number] DOB: 1981-06-26  Subjective:   Patricia Lin is a 41 y.o. female presenting for 3 day history of acute onset body pain, chest pain, back pain, diarrhea, coughing, sneezing, runny nose and chills. No history of asthma. No smoking. COVID testing was negative at home.  Patient had very close and prolonged exposure to influenza.  No current facility-administered medications for this encounter.  Current Outpatient Medications:    ferrous sulfate 325 (65 FE) MG tablet, TAKE 1 TABLET BY MOUTH 3 TIMES DAILY WITH MEALS., Disp: 90 tablet, Rfl: 3   Insulin Pen Needle (BD PEN NEEDLE NANO 2ND GEN) 32G X 4 MM MISC, Use 1 needle daily to inject medication., Disp: 100 each, Rfl: 0   Liraglutide -Weight Management (SAXENDA) 18 MG/3ML SOPN, Inject 3 mg into the skin daily., Disp: 15 mL, Rfl: 0   Liraglutide -Weight Management (SAXENDA) 18 MG/3ML SOPN, Inject 3 mg into the skin daily., Disp: 15 mL, Rfl: 0   metFORMIN (GLUCOPHAGE) 500 MG tablet, 1/2 tab with breakfast and 1/2 tab with lunch, Disp: 90 tablet, Rfl: 0   Vitamin D, Ergocalciferol, (DRISDOL) 1.25 MG (50000 UNIT) CAPS capsule, Take 1 capsule (50,000 Units total) by mouth every 7 (seven) days., Disp: 4 capsule, Rfl: 0   Allergies  Allergen Reactions   Latex Itching and Rash    Past Medical History:  Diagnosis Date   Abnormal Pap smear    Adenomyosis    Anemia    Anxiety    Back pain    BV (bacterial vaginosis) 2011   Chlamydia infection    CIN I (cervical intraepithelial neoplasia I) 2008   Depression    Dysmenorrhea    Dyspnea    Dysrhythmia 2009   TACHYCARDIA;   HAD W/U 2010? 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  B/P nl, and 122/76 on repeat-sitting   Postpartum depression 2011   NO MEDS   Pregnancy induced hypertension    ALL PREGNANCIES   Shortness of breath    WITH EXERTION SICNE PREGNANCY   Tachycardia    Tachycardia 2010   HAS HAD WORKUP. UNSURE IF HAS POSSIBLE HEART VALVE ISSUE   Vulvitis 2009     Past Surgical History:  Procedure Laterality Date   BREAST REDUCTION SURGERY  12/08/2006   BREAST SURGERY     CERVICAL BIOPSY  W/ LOOP ELECTRODE EXCISION  2010   DILATION AND CURETTAGE OF UTERUS     FINGER SURGERY     Right index finger   TUBAL LIGATION  01/09/2013   Procedure: POST PARTUM TUBAL LIGATION;  Surgeon: Michael Litter, MD;  Location: [facility];  Service: Gynecology;  Laterality: Bilateral;  post partum tubal ligation bilateral   US ECHOCARDIOGRAPHY  06/18/2009   ef 55-60%   WISDOM TOOTH EXTRACTION      Family History  Problem Relation Age of Onset   Hypertension Mother    Hyperlipidemia Mother    Coronary artery disease Mother    Heart disease Mother        high cholesterol   Asthma Mother    Diabetes Mother    Depression Mother    Anxiety disorder Mother    Sleep apnea Mother    Obesity Mother    Eating disorder Mother    Hypertension Father    High Cholesterol Father    Heart disease Father    Hypertension Maternal Grandmother    Hypertension Maternal Grandfather    Asthma Daughter    Alcohol abuse Maternal Uncle    Drug abuse Maternal Uncle     Social History   Tobacco  Use   Smoking status: Never   Smokeless tobacco: Never  Vaping Use   Vaping Use: Never used  Substance Use Topics   Alcohol use: Yes    Comment: socially   Drug use: No    ROS   Objective:   Vitals: BP (!) 134/95 (BP Location: Left Arm)   Pulse 64   Temp 98.2 F (36.8 C) (Oral)   Resp 16   SpO2 100%   Physical Exam Constitutional:      General: She is not in acute distress.    Appearance: Normal appearance. She is well-developed and normal weight. She is not ill-appearing, toxic-appearing or diaphoretic.  HENT:     Head: Normocephalic and atraumatic.     Right Ear: Tympanic membrane, ear canal and external ear normal. No drainage or tenderness. No middle ear effusion. There is no impacted cerumen. Tympanic membrane is not erythematous or bulging.     Left Ear: Tympanic membrane, ear canal and external ear normal. No drainage or tenderness.  No middle ear effusion. There is no impacted cerumen. Tympanic membrane is not erythematous or bulging.     Nose: Nose normal. No congestion or rhinorrhea.     Mouth/Throat:     Mouth: Mucous membranes are moist. No oral lesions.     Pharynx: No pharyngeal swelling, oropharyngeal exudate, posterior oropharyngeal erythema or uvula swelling.     Tonsils: No tonsillar exudate or tonsillar abscesses.  Eyes:     General: No scleral icterus.       Right eye: No discharge.        Left eye: No discharge.     Extraocular Movements: Extraocular movements intact.     Right eye: Normal extraocular motion.     Left eye: Normal extraocular motion.     Conjunctiva/sclera: Conjunctivae normal.  Cardiovascular:     Rate and Rhythm: Normal rate and regular rhythm.     Heart sounds: Normal heart sounds. No murmur heard.    No friction rub. No gallop.  Pulmonary:     Effort: Pulmonary effort is normal. No respiratory distress.     Breath sounds: No stridor. No wheezing, rhonchi or rales.  Chest:     Chest wall: No tenderness.  Musculoskeletal:      Cervical back: Normal range of motion and neck supple.  Lymphadenopathy:     Cervical: No cervical adenopathy.  Skin:    General: Skin is  warm and dry.  Neurological:     General: No focal deficit present.     Mental Status: She is alert and oriented to person, place, and time.  Psychiatric:        Mood and Affect: Mood normal.        Behavior: Behavior normal.     Assessment and Plan :   PDMP not reviewed this encounter.  1. Acute viral syndrome   2. Exposure to influenza     She is requesting empiric treatment for influenza given her exposure and her overall symptoms.  Cannot test for it due to a lack of lab supplies at [facility].  I was agreeable and offered her prescriptions for Tamiflu.  Also recommended supportive care for acute viral syndrome. Deferred imaging given clear cardiopulmonary exam, hemodynamically stable vital signs. Counseled patient on potential for adverse effects with medications prescribed/recommended today, ER and return-to-clinic precautions discussed, patient verbalized understanding.    Wallis Bamberg, PA-C 11/28/22 1442

## 2022-11-28 NOTE — Discharge Instructions (Addendum)
We will manage this as influenza with Tamiflu. For sore throat or cough try using a honey-based tea. Use 3 teaspoons of honey with juice squeezed from half lemon. Place shaved pieces of ginger into 1/2-1 cup of water and warm over stove top. Then mix the ingredients and repeat every 4 hours as needed. Please take ibuprofen 600mg  every 6 hours with food alternating with OR taken together with Tylenol 500mg -650mg  every 6 hours for throat pain, fevers, aches and pains. Hydrate very well with at least 2 liters of water. Eat light meals such as soups (chicken and noodles, vegetable, chicken and wild rice).  Do not eat foods that you are allergic to.  Taking an antihistamine like Zyrtec can help against postnasal drainage, sinus congestion which can cause sinus pain, sinus headaches, throat pain, painful swallowing, coughing.  You can take this together with pseudoephedrine (Sudafed) at a dose of 30-60 mg 3 times a day or twice daily as needed for the same kind of nasal drip, congestion.  Use the cough medications as needed.

## 2022-12-02 ENCOUNTER — Telehealth (INDEPENDENT_AMBULATORY_CARE_PROVIDER_SITE_OTHER): Payer: No Typology Code available for payment source | Admitting: Psychology

## 2022-12-04 NOTE — Progress Notes (Signed)
Chief Complaint:   OBESITY Patricia Lin is here to discuss her progress with her obesity treatment plan along with follow-up of her obesity related diagnoses. Patricia Lin is on the Category 1 Plan and states she is following her eating plan approximately 75% of the time. Patricia Lin states she is walking 60 minutes 2 times per week.  Today's visit was #: 11 Starting weight: 191 lbs Starting date: 09/08/2018 Today's weight: 189 lbs Today's date: 11/20/2022 Total lbs lost to date: 2 lbs Total lbs lost since last in-office visit: 1  Interim History: Patricia Lin has done well with weight loss overall.  Some decreased appetite by dinnertime and struggling to get all protein and calorie needs met at times.  Taking Saxenda 0.6 mg daily.  Reports no nausea/no vomiting/no neck mass.  Subjective:   1. Vitamin D deficiency Labs discussed during visit today.  Vitamin D level of 23.2 on 11/11/2022.  Has taken ergocalciferol without side effects in the past.  Not currently taking vitamin D.  2. Insulin resistance Labs discussed during visit today. A1c 5.2-at goal/insulin 11.7-near goal.  Taking Metformin 250 mg twice a day and GLP-1- Saxenda 0.6 mg daily.  Reports decreased appetite by evening-concerned may be combination of GLP-1 and Metformin.  Will stop Metformin and see if appetite in the evening improves.  Assessment/Plan:   1. Vitamin D deficiency Start ergocalciferol once a week for 1 month with 0 refills.  -Start Vitamin D, Ergocalciferol, (DRISDOL) 1.25 MG (50000 UNIT) CAPS capsule; Take 1 capsule (50,000 Units total) by mouth every 7 (seven) days.  Dispense: 4 capsule; Refill: 0  2. Insulin resistance Continue healthy eating plan to decrease simple carbohydrates, decrease saturated fats, increase lean proteins and exercise and GLP-1 (Saxenda 0.6 mg SQ) to promote weight loss.  3. Obesity, Current BMI 34.6 Patricia Lin is currently in the action stage of change. As such, her goal is to continue with weight  loss efforts. She has agreed to the Category 1 Plan.   Exercise goals: As is.  Continue Saxenda 0.6 mg daily for now and encouraged to try and meet calorie and protein needs.  Behavioral modification strategies: increasing lean protein intake, decreasing simple carbohydrates, no skipping meals, and holiday eating strategies .  Patricia Lin has agreed to follow-up with our clinic in 3 weeks. She was informed of the importance of frequent follow-up visits to maximize her success with intensive lifestyle modifications for her multiple health conditions.   Objective:   Blood pressure (!) 133/91, pulse 71, temperature 98.3 F (36.8 C), height _0  (1.575 m), weight 189 lb (85.7 kg), SpO2 97 %. Body mass index is 34.57 kg/m.  General: Cooperative, alert, well developed, in no acute distress. HEENT: Conjunctivae and lids unremarkable. Cardiovascular: Regular rhythm.  Lungs: Normal work of breathing. Neurologic: No focal deficits.   Lab Results  Component Value Date   CREATININE 0.75 11/11/2022   BUN 13 11/11/2022   NA 139 11/11/2022   K 4.2 11/11/2022   CL 103 11/11/2022   CO2 22 11/11/2022   Lab Results  Component Value Date   ALT 32 11/11/2022   AST 23 11/11/2022   ALKPHOS 77 11/11/2022   BILITOT 0.6 11/11/2022   Lab Results  Component Value Date   HGBA1C 5.2 11/11/2022   HGBA1C 5.2 10/01/2022   HGBA1C 5.5 05/02/2022   HGBA1C 5.2 09/08/2018   Lab Results  Component Value Date   INSULIN 11.7 11/11/2022   INSULIN 15.1 06/11/2022   INSULIN 11.5 09/08/2018  Lab Results  Component Value Date   TSH 0.923 11/11/2022   Lab Results  Component Value Date   CHOL 163 11/11/2022   HDL 58 11/11/2022   LDLCALC 92 11/11/2022   TRIG 65 11/11/2022   CHOLHDL 2.7 05/02/2022   Lab Results  Component Value Date   VD25OH 23.2 (L) 11/11/2022   VD25OH 22.2 (L) 06/11/2022   VD25OH 9.6 (L) 09/08/2018   Lab Results  Component Value Date   WBC 4.3 10/06/2022   HGB 11.3 (L)  10/06/2022   HCT 35.4 (L) 10/06/2022   MCV 85.7 10/06/2022   PLT 281 10/06/2022   Lab Results  Component Value Date   IRON 82 11/11/2022   TIBC 310 11/11/2022   FERRITIN 6 (L) 10/06/2022   Attestation Statements:   Reviewed by clinician on day of visit: allergies, medications, problem list, medical history, surgical history, family history, social history, and previous encounter notes.  I, Brendell Tyus, am acting as transcriptionist for [employer], [employer].  I have reviewed the above documentation for accuracy and completeness, and I agree with the above. -  Sallye Lunz,PA-C

## 2022-12-20 ENCOUNTER — Other Ambulatory Visit (INDEPENDENT_AMBULATORY_CARE_PROVIDER_SITE_OTHER): Payer: Self-pay | Admitting: Physician Assistant

## 2022-12-20 DIAGNOSIS — E559 Vitamin D deficiency, unspecified: Secondary | ICD-10-CM

## 2023-01-01 ENCOUNTER — Encounter: Payer: Self-pay | Admitting: Internal Medicine

## 2023-01-01 NOTE — Progress Notes (Signed)
Received voicemail from patient regarding appointment cost.  Sent to Patricia Lin whom created an estimate and sent to patient via mychart.  Called patient to confirm she received and she did. Patient was appreciative and states she applied for emergency Medicaid. Advised patient to be sure she submits bills once received to Medicaid. She wanted to be sure she could request to be billed for appointment. Advised she could and to make registrar aware at check-in.

## 2023-01-06 ENCOUNTER — Inpatient Hospital Stay: Payer: Medicaid Other | Attending: Internal Medicine

## 2023-01-06 ENCOUNTER — Inpatient Hospital Stay (HOSPITAL_BASED_OUTPATIENT_CLINIC_OR_DEPARTMENT_OTHER): Payer: Medicaid Other | Admitting: Internal Medicine

## 2023-01-06 VITALS — BP 127/85 | HR 77 | Temp 97.7°F | Resp 16 | Ht 62.0 in | Wt 178.8 lb

## 2023-01-06 DIAGNOSIS — I10 Essential (primary) hypertension: Secondary | ICD-10-CM | POA: Insufficient documentation

## 2023-01-06 DIAGNOSIS — D649 Anemia, unspecified: Secondary | ICD-10-CM

## 2023-01-06 DIAGNOSIS — D500 Iron deficiency anemia secondary to blood loss (chronic): Secondary | ICD-10-CM | POA: Diagnosis present

## 2023-01-06 DIAGNOSIS — D72819 Decreased white blood cell count, unspecified: Secondary | ICD-10-CM | POA: Insufficient documentation

## 2023-01-06 DIAGNOSIS — N920 Excessive and frequent menstruation with regular cycle: Secondary | ICD-10-CM | POA: Diagnosis not present

## 2023-01-06 LAB — CBC WITH DIFFERENTIAL (CANCER CENTER ONLY)
Abs Immature Granulocytes: 0.01 10*3/uL (ref 0.00–0.07)
Basophils Absolute: 0 10*3/uL (ref 0.0–0.1)
Basophils Relative: 1 %
Eosinophils Absolute: 0.1 10*3/uL (ref 0.0–0.5)
Eosinophils Relative: 2 %
HCT: 36.9 % (ref 36.0–46.0)
Hemoglobin: 12.1 g/dL (ref 12.0–15.0)
Immature Granulocytes: 0 %
Lymphocytes Relative: 45 %
Lymphs Abs: 1.8 10*3/uL (ref 0.7–4.0)
MCH: 27.5 pg (ref 26.0–34.0)
MCHC: 32.8 g/dL (ref 30.0–36.0)
MCV: 83.9 fL (ref 80.0–100.0)
Monocytes Absolute: 0.3 10*3/uL (ref 0.1–1.0)
Monocytes Relative: 6 %
Neutro Abs: 1.8 10*3/uL (ref 1.7–7.7)
Neutrophils Relative %: 46 %
Platelet Count: 296 10*3/uL (ref 150–400)
RBC: 4.4 MIL/uL (ref 3.87–5.11)
RDW: 14.2 % (ref 11.5–15.5)
WBC Count: 3.9 10*3/uL — ABNORMAL LOW (ref 4.0–10.5)
nRBC: 0 % (ref 0.0–0.2)

## 2023-01-06 LAB — IRON AND IRON BINDING CAPACITY (CC-WL,HP ONLY)
Iron: 73 ug/dL (ref 28–170)
Saturation Ratios: 23 % (ref 10.4–31.8)
TIBC: 319 ug/dL (ref 250–450)
UIBC: 246 ug/dL

## 2023-01-06 NOTE — Progress Notes (Signed)
[facility] [phone]   [phone]  PROGRESS NOTE FOR TELEMEDICINE VISITS  Camillia Herter, NP [place] [address]  I connected withNAME@ on 01/06/23 at  3:30 PM EST by video enabled telemedicine visit and verified that I am speaking with the correct person using two identifiers.   I discussed the limitations, risks, security and privacy concerns of performing an evaluation and management service by telemedicine and the availability of in-person appointments. I also discussed with the patient that there may be a patient responsible charge related to this service. The patient expressed understanding and agreed to proceed.  Other persons participating in the visit and their role in the encounter:  None  Patient's location: [facility] exam room Provider's location: [facility] office  DIAGNOSIS: Iron deficiency anemia secondary to menorrhagia.   PRIOR THERAPY: Venofer 500 Mg IV weekly for 2 weeks, last dose was given 05/13/2022   CURRENT THERAPY: Over-the-counter oral ferrous sulfate once daily.  INTERVAL HISTORY: Patricia Lin 42 y.o. female returns to the clinic today for follow-up visit.  The patient is feeling fine today with no concerning complaints except for mild fatigue.  She has no chest pain, shortness of breath, cough or hemoptysis.  She has no bleeding, bruises or ecchymosis.  She has no weight loss or night sweats.  She has been tolerating her treatment with the iron tablet fairly well except for constipation and she has a bowel movement every other day.  She is here today for evaluation and repeat CBC, iron study and ferritin.  MEDICAL HISTORY: Past Medical History:  Diagnosis Date   Abnormal Pap smear    Adenomyosis    Anemia    Anxiety    Back pain    BV (bacterial vaginosis) 2011   Chlamydia infection    CIN I (cervical intraepithelial neoplasia I) 2008   Depression     Dysmenorrhea    Dyspnea    Dysrhythmia 2009   TACHYCARDIA;   HAD W/U 2010? 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  B/P nl, and 122/76 on repeat-sitting   Postpartum depression 2011   NO MEDS   Pregnancy induced hypertension    ALL PREGNANCIES   Shortness of breath    WITH EXERTION SICNE PREGNANCY   Tachycardia    Tachycardia 2010   HAS HAD WORKUP. UNSURE IF HAS POSSIBLE HEART VALVE ISSUE   Vulvitis 2009    ALLERGIES:  is allergic to latex.  MEDICATIONS:  Current Outpatient Medications  Medication Sig Dispense Refill   benzonatate (TESSALON) 100 MG capsule Take 1 capsule (100 mg total) by mouth 3 (three)  times daily as needed for cough. 30 capsule 0   cetirizine (ZYRTEC ALLERGY) 10 MG tablet Take 1 tablet (10 mg total) by mouth daily. 30 tablet 0   ferrous sulfate 325 (65 FE) MG tablet TAKE 1 TABLET BY MOUTH 3 TIMES DAILY WITH MEALS. 90 tablet 3   Insulin Pen Needle (BD PEN NEEDLE NANO 2ND GEN) 32G X 4 MM MISC Use 1 needle daily to inject medication. 100 each 0   Liraglutide -Weight Management (SAXENDA) 18 MG/3ML SOPN Inject 3 mg into the skin daily. 15 mL 0   Liraglutide -Weight Management (SAXENDA) 18 MG/3ML SOPN Inject 3 mg into the skin daily. 15 mL 0   metFORMIN (GLUCOPHAGE) 500 MG tablet 1/2 tab with breakfast and 1/2 tab with lunch 90 tablet 0    oseltamivir (TAMIFLU) 6 MG/ML SUSR suspension Take 12.5 mLs (75 mg total) by mouth 2 (two) times daily. 125 mL 0   promethazine-dextromethorphan (PROMETHAZINE-DM) 6.25-15 MG/5ML syrup Take 5 mLs by mouth 3 (three) times daily as needed for cough. 200 mL 0   pseudoephedrine (SUDAFED) 60 MG tablet Take 1 tablet (60 mg total) by mouth every 8 (eight) hours as needed for congestion. 30 tablet 0   Vitamin D, Ergocalciferol, (DRISDOL) 1.25 MG (50000 UNIT) CAPS capsule Take 1 capsule (50,000 Units total) by mouth every 7 (seven) days. 4 capsule 0   No current facility-administered medications for this visit.    SURGICAL HISTORY:  Past Surgical History:  Procedure Laterality Date   BREAST REDUCTION SURGERY  12/08/2006   BREAST SURGERY     CERVICAL BIOPSY  W/ LOOP ELECTRODE EXCISION  2010   DILATION AND CURETTAGE OF UTERUS     FINGER SURGERY     Right index finger   TUBAL LIGATION  01/09/2013   Procedure: POST PARTUM TUBAL LIGATION;  Surgeon: Betsy Coder, MD;  Location: [facility];  Service: Gynecology;  Laterality: Bilateral;  post partum tubal ligation bilateral   US ECHOCARDIOGRAPHY  06/18/2009   ef 55-60%   WISDOM TOOTH EXTRACTION      REVIEW OF SYSTEMS:  A comprehensive review of systems was negative except for: Constitutional: positive for fatigue   ECOG PERFORMANCE STATUS: 0 - Asymptomatic  Blood pressure 127/85, pulse 77, temperature 97.7 F (36.5 C), temperature source Temporal, resp. rate 16, height 5\' 2"  (1.575 m), weight 178 lb 12.8 oz (81.1 kg), SpO2 98 %.  LABORATORY DATA: Lab Results  Component Value Date   WBC 3.9 (L) 01/06/2023   HGB 12.1 01/06/2023   HCT 36.9 01/06/2023   MCV 83.9 01/06/2023   PLT 296 01/06/2023      Chemistry      Component Value Date/Time   NA 139 11/11/2022 1017   K 4.2 11/11/2022 1017   CL 103 11/11/2022 1017   CO2 22 11/11/2022 1017   BUN 13 11/11/2022 1017   CREATININE 0.75 11/11/2022 1017   CREATININE 0.90 05/07/2022 1403    CREATININE 0.64 01/17/2013 1720      Component Value Date/Time   CALCIUM 9.7 11/11/2022 1017   ALKPHOS 77 11/11/2022 1017   AST 23 11/11/2022 1017   AST 24 05/07/2022 1403   ALT 32 11/11/2022 1017   ALT 27 05/07/2022 1403   BILITOT 0.6 11/11/2022 1017   BILITOT 0.6 05/07/2022 1403       RADIOGRAPHIC STUDIES: No results found.  ASSESSMENT AND PLAN:  This is a very pleasant 42 years old African-American female with history of iron deficiency anemia secondary to  menorrhagia and not responding to the oral iron tablets.  The patient was treated recently with iron infusion with Venofer 500 Mg IV weekly for 2 weeks. The patient is currently on oral ferrous sulfate and tolerating it fairly well.  The patient is doing fine today with no concerning complaints except for mild fatigue. Repeat CBC showed normal hemoglobin of 12.1 and hematocrit 36.9%.  She also has mild leukocytopenia with total white blood count of 3.9.  Iron study and ferritin are still pending I recommended for the patient to continue with the oral iron tablet for now. We will see her back for follow-up visit in 6 months for evaluation and repeat blood work unless the patient has any concerning symptoms or severe iron deficiency on the pending blood work then I will arrange for her to receive iron infusion in the interval. The patient was advised to call immediately if she has any other concerning symptoms. I discussed the assessment and treatment plan with the patient. The patient was provided an opportunity to ask questions and all were answered. The patient agreed with the plan and demonstrated an understanding of the instructions.   The patient was advised to call back or seek an in-person evaluation if the symptoms worsen or if the condition fails to improve as anticipated.  I provided 20 minutes of face-to-face video visit time during this encounter, and > 50% was spent counseling as documented under my assessment &  plan.  Eilleen Kempf, MD 01/06/2023 3:19 PM  Disclaimer: This note was dictated with voice recognition software. Similar sounding words can inadvertently be transcribed and may not be corrected upon review.

## 2023-01-07 LAB — FERRITIN: Ferritin: 50 ng/mL (ref 11–307)

## 2023-01-12 ENCOUNTER — Other Ambulatory Visit: Payer: Self-pay

## 2023-01-13 ENCOUNTER — Other Ambulatory Visit: Payer: Self-pay

## 2023-01-14 ENCOUNTER — Other Ambulatory Visit (HOSPITAL_COMMUNITY): Payer: Self-pay

## 2023-01-14 ENCOUNTER — Other Ambulatory Visit: Payer: Self-pay

## 2023-01-16 ENCOUNTER — Other Ambulatory Visit (HOSPITAL_COMMUNITY): Payer: Self-pay

## 2023-01-16 ENCOUNTER — Other Ambulatory Visit: Payer: Self-pay

## 2023-01-22 ENCOUNTER — Ambulatory Visit (INDEPENDENT_AMBULATORY_CARE_PROVIDER_SITE_OTHER): Payer: Medicaid Other | Admitting: Family Medicine

## 2023-01-22 ENCOUNTER — Encounter (INDEPENDENT_AMBULATORY_CARE_PROVIDER_SITE_OTHER): Payer: Self-pay | Admitting: Family Medicine

## 2023-01-22 VITALS — BP 144/82 | HR 73 | Temp 97.9°F | Ht 62.0 in | Wt 177.0 lb

## 2023-01-22 DIAGNOSIS — Z6832 Body mass index (BMI) 32.0-32.9, adult: Secondary | ICD-10-CM | POA: Diagnosis not present

## 2023-01-22 DIAGNOSIS — E669 Obesity, unspecified: Secondary | ICD-10-CM | POA: Insufficient documentation

## 2023-01-22 DIAGNOSIS — E88819 Insulin resistance, unspecified: Secondary | ICD-10-CM | POA: Diagnosis not present

## 2023-01-22 DIAGNOSIS — Z6835 Body mass index (BMI) 35.0-35.9, adult: Secondary | ICD-10-CM | POA: Insufficient documentation

## 2023-01-22 DIAGNOSIS — E559 Vitamin D deficiency, unspecified: Secondary | ICD-10-CM

## 2023-01-22 DIAGNOSIS — Z6837 Body mass index (BMI) 37.0-37.9, adult: Secondary | ICD-10-CM | POA: Insufficient documentation

## 2023-01-22 MED ORDER — VITAMIN D (ERGOCALCIFEROL) 1.25 MG (50000 UNIT) PO CAPS: 50000.0000 [IU] | ORAL_CAPSULE | ORAL | 0 refills | Status: DC

## 2023-02-09 NOTE — Progress Notes (Signed)
Chief Complaint:   OBESITY Patricia Lin is here to discuss her progress with her obesity treatment plan along with follow-up of her obesity related diagnoses. Patricia Lin is on the Category 1 Plan and states she is following her eating plan approximately 0% of the time. Patricia Lin states she is is walking and using the sauna for 60 minutes 2-3 times per week.  Today's visit was #: 12 Starting weight: 191 lbs Starting date: 09/08/2018 Today's weight: 177 lbs Today's date: 01/22/2023 Total lbs lost to date: 14 Total lbs lost since last in-office visit: 12  Interim History: Patricia Lin's last visit was approximately 2 months ago.  She has had a lot of celebration eating, but she has done well with getting back on track.  She has not been eating much protein however and she may be decreasing her RMR.  She is interested in a lower meat eating plan.   Subjective:   1. Vitamin D deficiency Patricia Lin's last vitamin D level was below goal.  No side effects were noted of vitamin D prescription.  2. Insulin resistance Patricia Lin is doing well on metformin and Saxenda.  She has worked on decreasing simple carbohydrates and increasing exercise.  Assessment/Plan:   1. Vitamin D deficiency Patricia Lin will continue prescription vitamin D, and we will refill for 1 month.  - Vitamin D, Ergocalciferol, (DRISDOL) 1.25 MG (50000 UNIT) CAPS capsule; Take 1 capsule (50,000 Units total) by mouth every 7 (seven) days.  Dispense: 4 capsule; Refill: 0  2. Insulin resistance Patricia Lin will continue with her diet, exercise, and metformin and we will continue to follow.  3. BMI 32.0-32.9,adult  4. Obesity, Beginning BMI 34.93 Patricia Lin is currently in the action stage of change. As such, her goal is to continue with weight loss efforts. She has agreed to change to the [place] or the [place].   Patricia Lin is to continue Saxenda at 0.6-1.2 mg, but not higher for now as she is at risk of sarcopenia.  Exercise goals: As  is.  Behavioral modification strategies: increasing lean protein intake and no skipping meals.  Patricia Lin has agreed to follow-up with our clinic in 4 weeks. She was informed of the importance of frequent follow-up visits to maximize her success with intensive lifestyle modifications for her multiple health conditions.   Objective:   Blood pressure (!) 144/82, pulse 73, temperature 97.9 F (36.6 C), height '5\' 2"'$  (1.575 m), weight 177 lb (80.3 kg), SpO2 99 %. Body mass index is 32.37 kg/m.  General: Cooperative, alert, well developed, in no acute distress. HEENT: Conjunctivae and lids unremarkable. Cardiovascular: Regular rhythm.  Lungs: Normal work of breathing. Neurologic: No focal deficits.   Lab Results  Component Value Date   CREATININE 0.75 11/11/2022   BUN 13 11/11/2022   NA 139 11/11/2022   K 4.2 11/11/2022   CL 103 11/11/2022   CO2 22 11/11/2022   Lab Results  Component Value Date   ALT 32 11/11/2022   AST 23 11/11/2022   ALKPHOS 77 11/11/2022   BILITOT 0.6 11/11/2022   Lab Results  Component Value Date   HGBA1C 5.2 11/11/2022   HGBA1C 5.2 10/01/2022   HGBA1C 5.5 05/02/2022   HGBA1C 5.2 09/08/2018   Lab Results  Component Value Date   INSULIN 11.7 11/11/2022   INSULIN 15.1 06/11/2022   INSULIN 11.5 09/08/2018   Lab Results  Component Value Date   TSH 0.923 11/11/2022   Lab Results  Component Value Date   CHOL 163 11/11/2022  HDL 58 11/11/2022   LDLCALC 92 11/11/2022   TRIG 65 11/11/2022   CHOLHDL 2.7 05/02/2022   Lab Results  Component Value Date   VD25OH 23.2 (L) 11/11/2022   VD25OH 22.2 (L) 06/11/2022   VD25OH 9.6 (L) 09/08/2018   Lab Results  Component Value Date   WBC 3.9 (L) 01/06/2023   HGB 12.1 01/06/2023   HCT 36.9 01/06/2023   MCV 83.9 01/06/2023   PLT 296 01/06/2023   Lab Results  Component Value Date   IRON 73 01/06/2023   TIBC 319 01/06/2023   FERRITIN 50 01/06/2023   Attestation Statements:   Reviewed by clinician on  day of visit: allergies, medications, problem list, medical history, surgical history, family history, social history, and previous encounter notes.   I, Trixie Dredge, am acting as transcriptionist for Dennard Nip, MD.  I have reviewed the above documentation for accuracy and completeness, and I agree with the above. -  Dennard Nip, MD

## 2023-02-12 ENCOUNTER — Other Ambulatory Visit (INDEPENDENT_AMBULATORY_CARE_PROVIDER_SITE_OTHER): Payer: Self-pay | Admitting: Family Medicine

## 2023-02-12 ENCOUNTER — Encounter (INDEPENDENT_AMBULATORY_CARE_PROVIDER_SITE_OTHER): Payer: Self-pay | Admitting: Physician Assistant

## 2023-02-12 ENCOUNTER — Other Ambulatory Visit (INDEPENDENT_AMBULATORY_CARE_PROVIDER_SITE_OTHER): Payer: Self-pay | Admitting: Physician Assistant

## 2023-02-12 ENCOUNTER — Other Ambulatory Visit (HOSPITAL_COMMUNITY): Payer: Self-pay

## 2023-02-12 DIAGNOSIS — R7303 Prediabetes: Secondary | ICD-10-CM

## 2023-02-12 MED ORDER — SAXENDA 18 MG/3ML ~~LOC~~ SOPN
3.0000 mg | PEN_INJECTOR | Freq: Every day | SUBCUTANEOUS | 0 refills | Status: DC
  Filled 2023-02-12: qty 15, 30d supply, fill #0

## 2023-02-19 ENCOUNTER — Ambulatory Visit (INDEPENDENT_AMBULATORY_CARE_PROVIDER_SITE_OTHER): Payer: Medicaid Other | Admitting: Family Medicine

## 2023-02-19 ENCOUNTER — Encounter (INDEPENDENT_AMBULATORY_CARE_PROVIDER_SITE_OTHER): Payer: Self-pay | Admitting: Family Medicine

## 2023-02-19 VITALS — BP 111/76 | HR 84 | Temp 98.1°F | Ht 62.0 in | Wt 178.0 lb

## 2023-02-19 DIAGNOSIS — F3289 Other specified depressive episodes: Secondary | ICD-10-CM

## 2023-02-19 DIAGNOSIS — E559 Vitamin D deficiency, unspecified: Secondary | ICD-10-CM

## 2023-02-19 DIAGNOSIS — Z6832 Body mass index (BMI) 32.0-32.9, adult: Secondary | ICD-10-CM

## 2023-02-19 DIAGNOSIS — R7303 Prediabetes: Secondary | ICD-10-CM

## 2023-02-19 DIAGNOSIS — E669 Obesity, unspecified: Secondary | ICD-10-CM

## 2023-02-23 ENCOUNTER — Other Ambulatory Visit (HOSPITAL_COMMUNITY): Payer: Self-pay

## 2023-02-23 MED ORDER — VITAMIN D (ERGOCALCIFEROL) 1.25 MG (50000 UNIT) PO CAPS
50000.0000 [IU] | ORAL_CAPSULE | ORAL | 0 refills | Status: DC
  Filled 2023-02-23: qty 4, 28d supply, fill #0

## 2023-02-23 MED ORDER — SAXENDA 18 MG/3ML ~~LOC~~ SOPN
3.0000 mg | PEN_INJECTOR | Freq: Every day | SUBCUTANEOUS | 0 refills | Status: DC
  Filled 2023-02-23: qty 15, 30d supply, fill #0

## 2023-02-23 MED ORDER — BUPROPION HCL ER (SR) 150 MG PO TB12
150.0000 mg | ORAL_TABLET | Freq: Every day | ORAL | 0 refills | Status: DC
  Filled 2023-02-23: qty 30, 30d supply, fill #0

## 2023-02-23 MED ORDER — METFORMIN HCL 500 MG PO TABS
250.0000 mg | ORAL_TABLET | Freq: Two times a day (BID) | ORAL | 0 refills | Status: DC
  Filled 2023-02-23: qty 90, 90d supply, fill #0

## 2023-02-25 NOTE — Progress Notes (Signed)
Chief Complaint:   OBESITY Patricia Lin is here to discuss her progress with her obesity treatment plan along with follow-up of her obesity related diagnoses. [name] is on the [place] or the [place] and states she is following her eating plan approximately 0% of the time. [name] states she is doing 0 minutes 0 times per week.  Today's visit was #: 13 Starting weight: 191 lbs Starting date: 09/08/2018 Today's weight: 178 lbs Today's date: 02/19/2023 Total lbs lost to date: 13 Total lbs lost since last in-office visit: 0  Interim History: [name] has struggled with weight loss as she is dealing with multiple stressors.  She gained some weight since her last visit but she has already gotten back on track with her eating plan.  Subjective:   1. Prediabetes Patricia Lin is on metformin and Saxenda but her new insurance will not cover Saxenda.  2. Vitamin D deficiency Patricia Lin's last vitamin D level was very low.  She is due to have labs rechecked soon.  3. Emotional Eating Behavior Patricia Lin notes increased stress eating and she is open to looking at medication options.  She has no history of seizure disorder.  Assessment/Plan:   1. Prediabetes Patricia Lin will continue her medications, and we will refill metformin and Saxenda for 1 month.  We will recheck labs in 1 month.  - metFORMIN (GLUCOPHAGE) 500 MG tablet; Take 1/2 tablet (250 mg total) by mouth 2 (two) times daily, with breakfast and lunch  Dispense: 90 tablet; Refill: 0 - Liraglutide -Weight Management (SAXENDA) 18 MG/3ML SOPN; Inject 3 mg into the skin daily.  Dispense: 15 mL; Refill: 0  2. Vitamin D deficiency Patricia Lin will continue prescription vitamin D, and we will refill for 1 month.  We will recheck labs in 1 month.  - Vitamin D, Ergocalciferol, (DRISDOL) 1.25 MG (50000 UNIT) CAPS capsule; Take 1 capsule (50,000 Units total) by mouth every 7 (seven) days.  Dispense: 4 capsule; Refill: 0  3. Emotional Eating  Behavior [name] agreed to start Wellbutrin SR 150 mg every morning with no refills.  - buPROPion (WELLBUTRIN SR) 150 MG 12 hr tablet; Take 1 tablet (150 mg total) by mouth daily.  Dispense: 30 tablet; Refill: 0  4. BMI 32.0-32.9,adult  5. Obesity, Beginning BMI 34.93 Patricia Lin is currently in the action stage of change. As such, her goal is to continue with weight loss efforts. She has agreed to the Category 1 Plan.   Behavioral modification strategies: increasing lean protein intake and emotional eating strategies.  Patricia Lin has agreed to follow-up with our clinic in 4 weeks. She was informed of the importance of frequent follow-up visits to maximize her success with intensive lifestyle modifications for her multiple health conditions.   Objective:   Blood pressure 111/76, pulse 84, temperature 98.1 F (36.7 C), height 5\' 2"  (1.575 m), weight 178 lb (80.7 kg), SpO2 98 %. Body mass index is 32.56 kg/m.  Lab Results  Component Value Date   CREATININE 0.75 11/11/2022   BUN 13 11/11/2022   NA 139 11/11/2022   K 4.2 11/11/2022   CL 103 11/11/2022   CO2 22 11/11/2022   Lab Results  Component Value Date   ALT 32 11/11/2022   AST 23 11/11/2022   ALKPHOS 77 11/11/2022   BILITOT 0.6 11/11/2022   Lab Results  Component Value Date   HGBA1C 5.2 11/11/2022   HGBA1C 5.2 10/01/2022   HGBA1C 5.5 05/02/2022   HGBA1C 5.2 09/08/2018   Lab Results  Component  Value Date   INSULIN 11.7 11/11/2022   INSULIN 15.1 06/11/2022   INSULIN 11.5 09/08/2018   Lab Results  Component Value Date   TSH 0.923 11/11/2022   Lab Results  Component Value Date   CHOL 163 11/11/2022   HDL 58 11/11/2022   LDLCALC 92 11/11/2022   TRIG 65 11/11/2022   CHOLHDL 2.7 05/02/2022   Lab Results  Component Value Date   VD25OH 23.2 (L) 11/11/2022   VD25OH 22.2 (L) 06/11/2022   VD25OH 9.6 (L) 09/08/2018   Lab Results  Component Value Date   WBC 3.9 (L) 01/06/2023   HGB 12.1 01/06/2023   HCT 36.9  01/06/2023   MCV 83.9 01/06/2023   PLT 296 01/06/2023   Lab Results  Component Value Date   IRON 73 01/06/2023   TIBC 319 01/06/2023   FERRITIN 50 01/06/2023   Attestation Statements:   Reviewed by clinician on day of visit: allergies, medications, problem list, medical history, surgical history, family history, social history, and previous encounter notes.   I, Trixie Dredge, am acting as transcriptionist for Dennard Nip, MD.  I have reviewed the above documentation for accuracy and completeness, and I agree with the above. -  Dennard Nip, MD

## 2023-03-02 ENCOUNTER — Telehealth: Payer: Self-pay

## 2023-03-02 NOTE — Telephone Encounter (Signed)
PA submitted through Cover My Meds for Saxenda.  Awaiting insurance determination.  Key: [id number]

## 2023-03-04 DIAGNOSIS — H5213 Myopia, bilateral: Secondary | ICD-10-CM | POA: Diagnosis not present

## 2023-03-09 NOTE — Telephone Encounter (Signed)
PA for Saxenda was denied.  You can ask for an Appeal by mail, by fax, by phone or in-person.  The last day to ask for an Appeal is 05/01/2023.   Patient notified via mychart.

## 2023-03-19 ENCOUNTER — Other Ambulatory Visit (HOSPITAL_COMMUNITY): Payer: Self-pay

## 2023-03-19 ENCOUNTER — Encounter (INDEPENDENT_AMBULATORY_CARE_PROVIDER_SITE_OTHER): Payer: Self-pay | Admitting: Family Medicine

## 2023-03-19 ENCOUNTER — Ambulatory Visit (INDEPENDENT_AMBULATORY_CARE_PROVIDER_SITE_OTHER): Payer: Medicaid Other | Admitting: Family Medicine

## 2023-03-19 VITALS — BP 113/76 | HR 68 | Temp 97.8°F | Ht 62.0 in | Wt 187.0 lb

## 2023-03-19 DIAGNOSIS — F3289 Other specified depressive episodes: Secondary | ICD-10-CM | POA: Diagnosis not present

## 2023-03-19 DIAGNOSIS — Z6834 Body mass index (BMI) 34.0-34.9, adult: Secondary | ICD-10-CM

## 2023-03-19 DIAGNOSIS — E559 Vitamin D deficiency, unspecified: Secondary | ICD-10-CM | POA: Diagnosis not present

## 2023-03-19 DIAGNOSIS — E669 Obesity, unspecified: Secondary | ICD-10-CM | POA: Diagnosis not present

## 2023-03-19 MED ORDER — VITAMIN D (ERGOCALCIFEROL) 1.25 MG (50000 UNIT) PO CAPS
50000.0000 [IU] | ORAL_CAPSULE | ORAL | 0 refills | Status: DC
  Filled 2023-03-19: qty 4, 28d supply, fill #0

## 2023-03-23 NOTE — Progress Notes (Unsigned)
Chief Complaint:   OBESITY Patricia Lin is here to discuss her progress with her obesity treatment plan along with follow-up of her obesity related diagnoses. Patricia Lin is on the Category 1 Plan and states she is following her eating plan approximately 0% of the time. Patricia Lin states she is doing 0 minutes 0 times per week.  Today's visit was #: 14 Starting weight: 191 lbs Starting date: 09/08/2018 Today's weight: 187 lbs Today's date: 03/19/2023 Total lbs lost to date: 4 Total lbs lost since last in-office visit: 0  Interim History: Patricia Lin has been on vacation in [place] for 3 weeks.  She did a lot of celebration eating and drinking, and she has gained some weight, but she is ready to get back on track.  Subjective:   1. Vitamin D deficiency Patricia Lin is stable on vitamin D, and she requests a refill today.  2. Emotional Eating Behavior Patricia Lin started Wellbutrin and she felt fuzzy headed so she stopped, and she felt better quickly.  She continues to work on decreasing emotional eating behavior.  Assessment/Plan:   1. Vitamin D deficiency Patricia Lin will continue prescription vitamin D 50,000 IU once weekly, and we will refill for 1 month.  - Vitamin D, Ergocalciferol, (DRISDOL) 1.25 MG (50000 UNIT) CAPS capsule; Take 1 capsule (50,000 Units total) by mouth every 7 (seven) days.  Dispense: 4 capsule; Refill: 0  2. Emotional Eating Behavior Patricia Lin agreed to discontinue Wellbutrin, and we will follow-up at her next visit in 3 weeks.  3. BMI 34.0-34.9,adult  4. Obesity, Beginning BMI 34.93 Patricia Lin is currently in the action stage of change. As such, her goal is to continue with weight loss efforts. She has agreed to get back to strictly following the Category 1 Plan.   Behavioral modification strategies: increasing lean protein intake.  Patricia Lin has agreed to follow-up with our clinic in 3 weeks. She was informed of the importance of frequent follow-up visits to maximize her success with  intensive lifestyle modifications for her multiple health conditions.   Objective:   Blood pressure 113/76, pulse 68, temperature 97.8 F (36.6 C), height 5\' 2"  (1.575 m), weight 187 lb (84.8 kg), SpO2 98 %. Body mass index is 34.2 kg/m.  Lab Results  Component Value Date   CREATININE 0.75 11/11/2022   BUN 13 11/11/2022   NA 139 11/11/2022   K 4.2 11/11/2022   CL 103 11/11/2022   CO2 22 11/11/2022   Lab Results  Component Value Date   ALT 32 11/11/2022   AST 23 11/11/2022   ALKPHOS 77 11/11/2022   BILITOT 0.6 11/11/2022   Lab Results  Component Value Date   HGBA1C 5.2 11/11/2022   HGBA1C 5.2 10/01/2022   HGBA1C 5.5 05/02/2022   HGBA1C 5.2 09/08/2018   Lab Results  Component Value Date   INSULIN 11.7 11/11/2022   INSULIN 15.1 06/11/2022   INSULIN 11.5 09/08/2018   Lab Results  Component Value Date   TSH 0.923 11/11/2022   Lab Results  Component Value Date   CHOL 163 11/11/2022   HDL 58 11/11/2022   LDLCALC 92 11/11/2022   TRIG 65 11/11/2022   CHOLHDL 2.7 05/02/2022   Lab Results  Component Value Date   VD25OH 23.2 (L) 11/11/2022   VD25OH 22.2 (L) 06/11/2022   VD25OH 9.6 (L) 09/08/2018   Lab Results  Component Value Date   WBC 3.9 (L) 01/06/2023   HGB 12.1 01/06/2023   HCT 36.9 01/06/2023   MCV 83.9 01/06/2023   PLT  296 01/06/2023   Lab Results  Component Value Date   IRON 73 01/06/2023   TIBC 319 01/06/2023   FERRITIN 50 01/06/2023   Attestation Statements:   Reviewed by clinician on day of visit: allergies, medications, problem list, medical history, surgical history, family history, social history, and previous encounter notes.   I, Burt Knack, am acting as transcriptionist for Quillian Quince, MD.  I have reviewed the above documentation for accuracy and completeness, and I agree with the above. -  Quillian Quince, MD

## 2023-04-09 ENCOUNTER — Ambulatory Visit (INDEPENDENT_AMBULATORY_CARE_PROVIDER_SITE_OTHER): Payer: Medicaid Other | Admitting: Family Medicine

## 2023-04-09 ENCOUNTER — Encounter (INDEPENDENT_AMBULATORY_CARE_PROVIDER_SITE_OTHER): Payer: Self-pay | Admitting: Family Medicine

## 2023-04-09 VITALS — BP 119/79 | HR 75 | Temp 97.6°F | Ht 62.0 in | Wt 191.8 lb

## 2023-04-09 DIAGNOSIS — R7303 Prediabetes: Secondary | ICD-10-CM | POA: Diagnosis not present

## 2023-04-09 DIAGNOSIS — Z6835 Body mass index (BMI) 35.0-35.9, adult: Secondary | ICD-10-CM | POA: Diagnosis not present

## 2023-04-09 DIAGNOSIS — E559 Vitamin D deficiency, unspecified: Secondary | ICD-10-CM | POA: Diagnosis not present

## 2023-04-09 DIAGNOSIS — E669 Obesity, unspecified: Secondary | ICD-10-CM

## 2023-04-09 MED ORDER — METFORMIN HCL 500 MG PO TABS: 250.0000 mg | ORAL_TABLET | Freq: Two times a day (BID) | ORAL | 0 refills | Status: DC

## 2023-04-09 MED ORDER — VITAMIN D (ERGOCALCIFEROL) 1.25 MG (50000 UNIT) PO CAPS: 50000.0000 [IU] | ORAL_CAPSULE | ORAL | 0 refills | Status: DC

## 2023-04-13 NOTE — Progress Notes (Signed)
Chief Complaint:   OBESITY Belita is here to discuss her progress with her obesity treatment plan along with follow-up of her obesity related diagnoses. Shirah is on the Category 1 Plan and states she is following her eating plan approximately 0% of the time. Cydne states she is doing 0 minutes 0 times per week.  Today's visit was #: 15 Starting weight: 191 lbs Starting date: 09/08/2018 Today's weight: 191 lbs Today's date: 04/09/2023 Total lbs lost to date: 0 Total lbs lost since last in-office visit: 0  Interim History: Breionna notes stress has increased and she has not been able to concentrate on following her eating plan.  It seems like her protein has decreased which has likely decreased her RMR.  Subjective:   1. Prediabetes Clydine is on metformin, and she is doing well with no GI upset.  Her last A1c was well-controlled.  2. Vitamin D deficiency Wilna is on vitamin D, and her last vitamin D level was still low.  She notes fatigue.  Assessment/Plan:   1. Prediabetes We will refill metformin for 90 days.  May try to start a GLP-1 next month based on new insurance coverage.  - metFORMIN (GLUCOPHAGE) 500 MG tablet; Take 1/2 tablet (250 mg total) by mouth 2 (two) times daily, with breakfast and lunch  Dispense: 90 tablet; Refill: 0  2. Vitamin D deficiency We will refill prescription vitamin D for 1 month.  We will recheck labs in 1 to 2 months.  - Vitamin D, Ergocalciferol, (DRISDOL) 1.25 MG (50000 UNIT) CAPS capsule; Take 1 capsule (50,000 Units total) by mouth every 7 (seven) days.  Dispense: 4 capsule; Refill: 0  3. BMI 35.0-35.9,adult  4. Obesity, Beginning BMI 34.93 Milicent is currently in the action stage of change. As such, her goal is to continue with weight loss efforts. She has agreed to change to keeping a food journal and adhering to recommended goals of 1000-1200 calories and 75+ grams of protein daily.   Behavioral modification strategies: meal planning  and cooking strategies and emotional eating strategies.  Sadie has agreed to follow-up with our clinic in 4 weeks. She was informed of the importance of frequent follow-up visits to maximize her success with intensive lifestyle modifications for her multiple health conditions.   Objective:   Blood pressure 119/79, pulse 75, temperature 97.6 F (36.4 C), height 5\' 2"  (1.575 m), weight 191 lb 12.8 oz (87 kg), SpO2 99 %. Body mass index is 35.08 kg/m.  Lab Results  Component Value Date   CREATININE 0.75 11/11/2022   BUN 13 11/11/2022   NA 139 11/11/2022   K 4.2 11/11/2022   CL 103 11/11/2022   CO2 22 11/11/2022   Lab Results  Component Value Date   ALT 32 11/11/2022   AST 23 11/11/2022   ALKPHOS 77 11/11/2022   BILITOT 0.6 11/11/2022   Lab Results  Component Value Date   HGBA1C 5.2 11/11/2022   HGBA1C 5.2 10/01/2022   HGBA1C 5.5 05/02/2022   HGBA1C 5.2 09/08/2018   Lab Results  Component Value Date   INSULIN 11.7 11/11/2022   INSULIN 15.1 06/11/2022   INSULIN 11.5 09/08/2018   Lab Results  Component Value Date   TSH 0.923 11/11/2022   Lab Results  Component Value Date   CHOL 163 11/11/2022   HDL 58 11/11/2022   LDLCALC 92 11/11/2022   TRIG 65 11/11/2022   CHOLHDL 2.7 05/02/2022   Lab Results  Component Value Date   VD25OH 23.2 (  L) 11/11/2022   VD25OH 22.2 (L) 06/11/2022   VD25OH 9.6 (L) 09/08/2018   Lab Results  Component Value Date   WBC 3.9 (L) 01/06/2023   HGB 12.1 01/06/2023   HCT 36.9 01/06/2023   MCV 83.9 01/06/2023   PLT 296 01/06/2023   Lab Results  Component Value Date   IRON 73 01/06/2023   TIBC 319 01/06/2023   FERRITIN 50 01/06/2023   Attestation Statements:   Reviewed by clinician on day of visit: allergies, medications, problem list, medical history, surgical history, family history, social history, and previous encounter notes.   I, Burt Knack, am acting as transcriptionist for Quillian Quince, MD.  I have reviewed the above  documentation for accuracy and completeness, and I agree with the above. -  Quillian Quince, MD

## 2023-04-29 NOTE — Progress Notes (Signed)
Patient ID: Patricia Lin, female    DOB: [birth year]  MRN: [id number]  CC: Annual Physical Exam  Subjective: Patricia Lin is a 42 y.o. female who presents for annual physical exam.   Her concerns today include:  - Constant neck pain radiating to right shoulder x 2 months. Denies recent trauma/injury and red flag symptoms. Using over-the-counter medications with no relief. States thinks related to weight gain. Requests referral to Chiropractic.  - Requests referral to weight specialist.  - States feels bronchitis returning due to intermittent cough, wheezing, and mucus production. Her physical exam today is unremarkable and patient is not in cardiopulmonary/acute distress. - No further issues/concerns for discussion today.   Patient Active Problem List   Diagnosis Date Noted   BMI 32.0-32.9,adult 01/22/2023   Obesity, Beginning BMI 34.93 01/22/2023   Binge-Eating Disorder, Mild 09/29/2022   Prediabetes 08/12/2022   Depression 08/12/2022   Class 1 obesity with serious comorbidity and body mass index (BMI) of 34.0 to 34.9 in adult 08/12/2022   Iron deficiency anemia 07/29/2022   Insulin resistance 06/28/2022   Essential hypertension 06/28/2022   Iron deficiency 06/28/2022   Vitamin D deficiency 06/28/2022   Other hyperlipidemia 06/28/2022   At risk of diabetes mellitus 06/28/2022   Iron deficiency anemia due to chronic blood loss 05/07/2022   Right lower quadrant abdominal pain 01/28/2019   Generalized headaches 12/17/2018   Anxiety 12/17/2018   History of anemia 05/26/2018   Anemia 12/05/2017   Routine health maintenance 03/03/2013   S/P tubal ligation 02/09/2013   Vaginal delivery 01/09/2013   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   Fracture of left ulna 09/27/12 10/23/2012   Cervical funneling 09/18/2012   Cervical shortening 09/18/2012   Symptomatic anemia 09/03/2012   H/O pre-eclampsia in prior pregnancy, currently pregnant 08/19/2012   Chronic hypertension in pregnancy 08/19/2012    Hx LEEP (loop electrosurgical excision procedure), cervix, pregnancy 08/19/2012   Hx of postpartum depression, currently pregnant 08/19/2012   Late prenatal care 08/19/2012   Hx of precipitous labor and deliveries, antepartum 08/19/2012   Muscle tension HAs 08/19/2012     Current Outpatient Medications on File Prior to Visit  Medication Sig Dispense Refill   ferrous sulfate 325 (65 FE) MG tablet TAKE 1 TABLET BY MOUTH 3 TIMES DAILY WITH MEALS. 90 tablet 3   Insulin Pen Needle (BD PEN NEEDLE NANO 2ND GEN) 32G X 4 MM MISC Use 1 needle daily to inject medication. 100 each 0   Liraglutide -Weight Management (SAXENDA) 18 MG/3ML SOPN Inject 3 mg into the skin daily. 15 mL 0   Liraglutide -Weight Management (SAXENDA) 18 MG/3ML SOPN Inject 3 mg into the skin daily. 15 mL 0   metFORMIN (GLUCOPHAGE) 500 MG tablet Take 1/2 tablet (250 mg total) by mouth 2 (two) times daily, with breakfast and lunch 90 tablet 0   Vitamin D, Ergocalciferol, (DRISDOL) 1.25 MG (50000 UNIT) CAPS capsule Take 1 capsule (50,000 Units total) by mouth every 7 (seven) days. 4 capsule 0   No current facility-administered medications on file prior to visit.    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, theatre/television/film  [employer]  Tobacco Use   Smoking status: Never   Smokeless tobacco: Never  Vaping Use   Vaping Use: Never used  Substance and Sexual Activity   Alcohol use: Yes    Comment: socially   Drug use: No   Sexual activity: Yes    Partners: Male    Birth control/protection: Surgical  Other Topics Concern   Not on file  Social History Narrative   Not on file   Social Determinants of Health   Financial  Resource Strain: Not on file  Food Insecurity: Not on file  Transportation Needs: Not on file  Physical Activity: Not on file  Stress: Not on file  Social Connections: Not on file  Intimate Partner Violence: Not on file    Family History  Problem Relation Age of Onset   Hypertension Mother    Hyperlipidemia Mother    Coronary artery disease Mother    Heart disease Mother        high cholesterol   Asthma Mother    Diabetes Mother    Depression Mother    Anxiety disorder Mother    Sleep apnea Mother    Obesity Mother    Eating disorder Mother    Hypertension Father    High Cholesterol Father    Heart disease Father    Hypertension Maternal Grandmother    Hypertension Maternal Grandfather    Asthma Daughter    Alcohol abuse Maternal Uncle    Drug abuse Maternal Uncle     Past Surgical History:  Procedure Laterality Date   BREAST REDUCTION SURGERY  12/08/2006   BREAST SURGERY     CERVICAL BIOPSY  W/ LOOP ELECTRODE EXCISION  2010   DILATION AND CURETTAGE OF UTERUS     FINGER SURGERY     Right index finger   TUBAL LIGATION  01/09/2013   Procedure: POST PARTUM TUBAL LIGATION;  Surgeon: Michael Litter, MD;  Location: [facility];  Service: Gynecology;  Laterality: Bilateral;  post partum tubal ligation bilateral   US ECHOCARDIOGRAPHY  06/18/2009   ef 55-60%   WISDOM TOOTH EXTRACTION      ROS: Review of Systems Negative except as stated above  PHYSICAL EXAM: BP (!) 138/91 (BP Location: Right Arm, Patient Position: Sitting, Cuff Size: Normal)   Pulse 74   Temp 98.3 F (36.8 C)   Resp 12   Ht 5\' 2"  (1.575 m)   Wt 201 lb 3.2 oz (91.3 kg)   LMP 04/26/2023   SpO2 99%   BMI 36.80 kg/m   Physical Exam HENT:     Head: Normocephalic and atraumatic.     Right Ear: Tympanic membrane, ear canal and external ear normal.     Left Ear: Tympanic membrane, ear canal and external ear normal.     Nose: Nose normal.     Mouth/Throat:     Mouth: Mucous membranes are moist.      Pharynx: Oropharynx is clear.  Eyes:     Extraocular Movements: Extraocular movements intact.     Conjunctiva/sclera: Conjunctivae normal.     Pupils: Pupils are equal, round, and reactive to light.  Cardiovascular:     Rate and Rhythm: Normal rate and regular rhythm.     Pulses: Normal pulses.     Heart sounds: Normal heart sounds.  Pulmonary:     Effort: Pulmonary effort is normal.     Breath sounds: Normal breath sounds.  Chest:     Comments: Patient declined. Abdominal:  General: Bowel sounds are normal.     Palpations: Abdomen is soft.  Genitourinary:    Comments: Patient declined. Musculoskeletal:        General: Normal range of motion.     Right shoulder: Tenderness present.     Left shoulder: Normal.     Right upper arm: Normal.     Left upper arm: Normal.     Right elbow: Normal.     Left elbow: Normal.     Right forearm: Normal.     Left forearm: Normal.     Right wrist: Normal.     Left wrist: Normal.     Right hand: Normal.     Left hand: Normal.     Cervical back: Normal range of motion and neck supple. Tenderness present.     Thoracic back: Normal.     Lumbar back: Normal.     Right hip: Normal.     Left hip: Normal.     Right upper leg: Normal.     Left upper leg: Normal.     Right knee: Normal.     Left knee: Normal.     Right lower leg: Normal.     Left lower leg: Normal.     Right ankle: Normal.     Left ankle: Normal.     Right foot: Normal.     Left foot: Normal.  Skin:    General: Skin is warm and dry.     Capillary Refill: Capillary refill takes less than 2 seconds.  Neurological:     General: No focal deficit present.     Mental Status: She is alert and oriented to person, place, and time.  Psychiatric:        Mood and Affect: Mood normal.        Behavior: Behavior normal.       ASSESSMENT AND PLAN: 1. Annual physical exam - Counseled on 150 minutes of exercise per week as tolerated, healthy eating (including decreased daily  intake of saturated fats, cholesterol, added sugars, sodium), STI prevention, and routine healthcare maintenance.  2. Screening for metabolic disorder - Routine screening.  - CMP14+EGFR  3. Diabetes mellitus screening - Routine screening.  - Hemoglobin A1c  4. Screening cholesterol level - Routine screening.  - Lipid panel  5. Thyroid disorder screen - Routine screening.  - TSH  6. Neck pain 7. Right shoulder pain, unspecified chronicity - Triamcinolone Acetonide and Ketorolac injections administered in office. - Referral to Chiropractic for further evaluation/management. During the interim follow-up with primary provider as scheduled. - triamcinolone acetonide (KENALOG-40) injection 40 mg - ketorolac (TORADOL) injection 60 mg - Ambulatory referral to Chiropractic  8. Encounter for weight management 9. BMI 36.0-36.9,adult 10. Weight gain - Patient declined pharmacological therapy.  - Referral to Medical Weight Management for further evaluation/management. - Amb Ref to Medical Weight Management   Patient was given the opportunity to ask questions.  Patient verbalized understanding of the plan and was able to repeat key elements of the plan. Patient was given clear instructions to go to Emergency Department or return to medical center if symptoms don't improve, worsen, or new problems develop.The patient verbalized understanding.   Orders Placed This Encounter  Procedures   Lipid panel   TSH   CMP14+EGFR   Hemoglobin A1c   Ambulatory referral to Chiropractic   Amb Ref to Medical Weight Management     Return for Physical per patient preference.  Rema Fendt, NP

## 2023-05-08 ENCOUNTER — Other Ambulatory Visit: Payer: Self-pay | Admitting: Family

## 2023-05-08 ENCOUNTER — Encounter: Payer: Self-pay | Admitting: Family

## 2023-05-08 ENCOUNTER — Ambulatory Visit (INDEPENDENT_AMBULATORY_CARE_PROVIDER_SITE_OTHER): Payer: Medicaid Other | Admitting: Family

## 2023-05-08 VITALS — BP 138/91 | HR 74 | Temp 98.3°F | Resp 12 | Ht 62.0 in | Wt 201.2 lb

## 2023-05-08 DIAGNOSIS — Z114 Encounter for screening for human immunodeficiency virus [HIV]: Secondary | ICD-10-CM | POA: Diagnosis not present

## 2023-05-08 DIAGNOSIS — Z7689 Persons encountering health services in other specified circumstances: Secondary | ICD-10-CM

## 2023-05-08 DIAGNOSIS — Z0001 Encounter for general adult medical examination with abnormal findings: Secondary | ICD-10-CM | POA: Diagnosis not present

## 2023-05-08 DIAGNOSIS — F17210 Nicotine dependence, cigarettes, uncomplicated: Secondary | ICD-10-CM

## 2023-05-08 DIAGNOSIS — Z1329 Encounter for screening for other suspected endocrine disorder: Secondary | ICD-10-CM | POA: Diagnosis not present

## 2023-05-08 DIAGNOSIS — D649 Anemia, unspecified: Secondary | ICD-10-CM

## 2023-05-08 DIAGNOSIS — Z6836 Body mass index (BMI) 36.0-36.9, adult: Secondary | ICD-10-CM

## 2023-05-08 DIAGNOSIS — Z13220 Encounter for screening for lipoid disorders: Secondary | ICD-10-CM | POA: Diagnosis not present

## 2023-05-08 DIAGNOSIS — R635 Abnormal weight gain: Secondary | ICD-10-CM | POA: Diagnosis not present

## 2023-05-08 DIAGNOSIS — M542 Cervicalgia: Secondary | ICD-10-CM | POA: Diagnosis not present

## 2023-05-08 DIAGNOSIS — Z1159 Encounter for screening for other viral diseases: Secondary | ICD-10-CM | POA: Diagnosis not present

## 2023-05-08 DIAGNOSIS — M25511 Pain in right shoulder: Secondary | ICD-10-CM | POA: Diagnosis not present

## 2023-05-08 DIAGNOSIS — Z0000 Encounter for general adult medical examination without abnormal findings: Secondary | ICD-10-CM

## 2023-05-08 DIAGNOSIS — Z13228 Encounter for screening for other metabolic disorders: Secondary | ICD-10-CM | POA: Diagnosis not present

## 2023-05-08 DIAGNOSIS — Z131 Encounter for screening for diabetes mellitus: Secondary | ICD-10-CM

## 2023-05-08 MED ORDER — TRIAMCINOLONE ACETONIDE 40 MG/ML IJ SUSP
40.0000 mg | Freq: Once | INTRAMUSCULAR | Status: DC
Start: 2023-05-08 — End: 2024-10-21

## 2023-05-08 MED ORDER — KETOROLAC TROMETHAMINE 60 MG/2ML IM SOLN
60.0000 mg | Freq: Once | INTRAMUSCULAR | Status: AC
Start: 2023-05-08 — End: 2023-05-13

## 2023-05-08 NOTE — Patient Instructions (Signed)
Preventive Care 43-43 Years Old, Female Preventive care refers to lifestyle choices and visits with your health care provider that can promote health and wellness. Preventive care visits are also called wellness exams. What can I expect for my preventive care visit? Counseling Your health care provider may ask you questions about your: Medical history, including: Past medical problems. Family medical history. Pregnancy history. Current health, including: Menstrual cycle. Method of birth control. Emotional well-being. Home life and relationship well-being. Sexual activity and sexual health. Lifestyle, including: Alcohol, nicotine or tobacco, and drug use. Access to firearms. Diet, exercise, and sleep habits. Work and work environment. Sunscreen use. Safety issues such as seatbelt and bike helmet use. Physical exam Your health care provider will check your: Height and weight. These may be used to calculate your BMI (body mass index). BMI is a measurement that tells if you are at a healthy weight. Waist circumference. This measures the distance around your waistline. This measurement also tells if you are at a healthy weight and may help predict your risk of certain diseases, such as type 2 diabetes and high blood pressure. Heart rate and blood pressure. Body temperature. Skin for abnormal spots. What immunizations do I need?  Vaccines are usually given at various ages, according to a schedule. Your health care provider will recommend vaccines for you based on your age, medical history, and lifestyle or other factors, such as travel or where you work. What tests do I need? Screening Your health care provider may recommend screening tests for certain conditions. This may include: Lipid and cholesterol levels. Diabetes screening. This is done by checking your blood sugar (glucose) after you have not eaten for a while (fasting). Pelvic exam and Pap test. Hepatitis B test. Hepatitis C  test. HIV (human immunodeficiency virus) test. STI (sexually transmitted infection) testing, if you are at risk. Lung cancer screening. Colorectal cancer screening. Mammogram. Talk with your health care provider about when you should start having regular mammograms. This may depend on whether you have a family history of breast cancer. BRCA-related cancer screening. This may be done if you have a family history of breast, ovarian, tubal, or peritoneal cancers. Bone density scan. This is done to screen for osteoporosis. Talk with your health care provider about your test results, treatment options, and if necessary, the need for more tests. Follow these instructions at home: Eating and drinking  Eat a diet that includes fresh fruits and vegetables, whole grains, lean protein, and low-fat dairy products. Take vitamin and mineral supplements as recommended by your health care provider. Do not drink alcohol if: Your health care provider tells you not to drink. You are pregnant, may be pregnant, or are planning to become pregnant. If you drink alcohol: Limit how much you have to 0-1 drink a day. Know how much alcohol is in your drink. In the [place] one drink equals one 12 oz bottle of beer (355 mL), one 5 oz glass of wine (148 mL), or one 1 oz glass of hard liquor (44 mL). Lifestyle Brush your teeth every morning and night with fluoride toothpaste. Floss one time each day. Exercise for at least 30 minutes 5 or more days each week. Do not use any products that contain nicotine or tobacco. These products include cigarettes, chewing tobacco, and vaping devices, such as e-cigarettes. If you need help quitting, ask your health care provider. Do not use drugs. If you are sexually active, practice safe sex. Use a condom or other form of protection to   prevent STIs. If you do not wish to become pregnant, use a form of birth control. If you plan to become pregnant, see your health care provider for a  prepregnancy visit. Take aspirin only as told by your health care provider. Make sure that you understand how much to take and what form to take. Work with your health care provider to find out whether it is safe and beneficial for you to take aspirin daily. Find healthy ways to manage stress, such as: Meditation, yoga, or listening to music. Journaling. Talking to a trusted person. Spending time with friends and family. Minimize exposure to UV radiation to reduce your risk of skin cancer. Safety Always wear your seat belt while driving or riding in a vehicle. Do not drive: If you have been drinking alcohol. Do not ride with someone who has been drinking. When you are tired or distracted. While texting. If you have been using any mind-altering substances or drugs. Wear a helmet and other protective equipment during sports activities. If you have firearms in your house, make sure you follow all gun safety procedures. Seek help if you have been physically or sexually abused. What's next? Visit your health care provider once a year for an annual wellness visit. Ask your health care provider how often you should have your eyes and teeth checked. Stay up to date on all vaccines. This information is not intended to replace advice given to you by your health care provider. Make sure you discuss any questions you have with your health care provider. Document Revised: 05/22/2021 Document Reviewed: 05/22/2021 Elsevier Patient Education  2024 [employer]  

## 2023-05-08 NOTE — Progress Notes (Signed)
Pt is here for physical   Complaining of neck and right shoulder pain for X2 months No injury to same per pt

## 2023-05-09 LAB — CMP14+EGFR
ALT: 35 IU/L — ABNORMAL HIGH (ref 0–32)
AST: 30 IU/L (ref 0–40)
Albumin/Globulin Ratio: 1.4 (ref 1.2–2.2)
Albumin: 4.2 g/dL (ref 3.9–4.9)
Alkaline Phosphatase: 91 IU/L (ref 44–121)
BUN/Creatinine Ratio: 19 (ref 9–23)
BUN: 14 mg/dL (ref 6–24)
Bilirubin Total: 0.5 mg/dL (ref 0.0–1.2)
CO2: 23 mmol/L (ref 20–29)
Calcium: 9.3 mg/dL (ref 8.7–10.2)
Chloride: 104 mmol/L (ref 96–106)
Creatinine, Ser: 0.74 mg/dL (ref 0.57–1.00)
Globulin, Total: 3 g/dL (ref 1.5–4.5)
Glucose: 96 mg/dL (ref 70–99)
Potassium: 4.1 mmol/L (ref 3.5–5.2)
Sodium: 140 mmol/L (ref 134–144)
Total Protein: 7.2 g/dL (ref 6.0–8.5)
eGFR: 104 mL/min/{1.73_m2} (ref >59)

## 2023-05-09 LAB — TSH: TSH: 1.28 u[IU]/mL (ref 0.450–4.500)

## 2023-05-09 LAB — LIPID PANEL
Chol/HDL Ratio: 2.4 ratio (ref 0.0–4.4)
Cholesterol, Total: 192 mg/dL (ref 100–199)
HDL: 80 mg/dL (ref >39)
LDL Chol Calc (NIH): 98 mg/dL (ref 0–99)
Triglycerides: 79 mg/dL (ref 0–149)
VLDL Cholesterol Cal: 14 mg/dL (ref 5–40)

## 2023-05-09 LAB — HEMOGLOBIN A1C
Est. average glucose Bld gHb Est-mCnc: 114 mg/dL
Hgb A1c MFr Bld: 5.6 % (ref 4.8–5.6)

## 2023-05-09 LAB — HIV ANTIBODY (ROUTINE TESTING W REFLEX): HIV Screen 4th Generation wRfx: NONREACTIVE

## 2023-05-09 LAB — HEPATITIS C ANTIBODY: Hep C Virus Ab: NONREACTIVE

## 2023-05-11 ENCOUNTER — Other Ambulatory Visit: Payer: Self-pay | Admitting: Family

## 2023-05-11 DIAGNOSIS — Z13228 Encounter for screening for other metabolic disorders: Secondary | ICD-10-CM

## 2023-05-12 DIAGNOSIS — M9903 Segmental and somatic dysfunction of lumbar region: Secondary | ICD-10-CM | POA: Diagnosis not present

## 2023-05-12 DIAGNOSIS — M9905 Segmental and somatic dysfunction of pelvic region: Secondary | ICD-10-CM | POA: Diagnosis not present

## 2023-05-12 DIAGNOSIS — M9904 Segmental and somatic dysfunction of sacral region: Secondary | ICD-10-CM | POA: Diagnosis not present

## 2023-05-12 DIAGNOSIS — M5386 Other specified dorsopathies, lumbar region: Secondary | ICD-10-CM | POA: Diagnosis not present

## 2023-05-13 DIAGNOSIS — M9905 Segmental and somatic dysfunction of pelvic region: Secondary | ICD-10-CM | POA: Diagnosis not present

## 2023-05-13 DIAGNOSIS — M9903 Segmental and somatic dysfunction of lumbar region: Secondary | ICD-10-CM | POA: Diagnosis not present

## 2023-05-13 DIAGNOSIS — M5386 Other specified dorsopathies, lumbar region: Secondary | ICD-10-CM | POA: Diagnosis not present

## 2023-05-13 DIAGNOSIS — M9904 Segmental and somatic dysfunction of sacral region: Secondary | ICD-10-CM | POA: Diagnosis not present

## 2023-05-14 ENCOUNTER — Encounter (INDEPENDENT_AMBULATORY_CARE_PROVIDER_SITE_OTHER): Payer: Self-pay | Admitting: Family Medicine

## 2023-05-14 ENCOUNTER — Ambulatory Visit (INDEPENDENT_AMBULATORY_CARE_PROVIDER_SITE_OTHER): Payer: Medicaid Other | Admitting: Family Medicine

## 2023-05-14 ENCOUNTER — Other Ambulatory Visit (HOSPITAL_COMMUNITY): Payer: Self-pay

## 2023-05-14 VITALS — BP 123/76 | HR 73 | Temp 97.8°F | Ht 62.0 in | Wt 194.0 lb

## 2023-05-14 DIAGNOSIS — Z6835 Body mass index (BMI) 35.0-35.9, adult: Secondary | ICD-10-CM | POA: Diagnosis not present

## 2023-05-14 DIAGNOSIS — R7401 Elevation of levels of liver transaminase levels: Secondary | ICD-10-CM | POA: Diagnosis not present

## 2023-05-14 DIAGNOSIS — E559 Vitamin D deficiency, unspecified: Secondary | ICD-10-CM

## 2023-05-14 DIAGNOSIS — E669 Obesity, unspecified: Secondary | ICD-10-CM

## 2023-05-14 DIAGNOSIS — R7303 Prediabetes: Secondary | ICD-10-CM | POA: Diagnosis not present

## 2023-05-14 DIAGNOSIS — M25519 Pain in unspecified shoulder: Secondary | ICD-10-CM | POA: Diagnosis not present

## 2023-05-14 MED ORDER — METFORMIN HCL 500 MG PO TABS
250.0000 mg | ORAL_TABLET | Freq: Two times a day (BID) | ORAL | 0 refills | Status: DC
Start: 2023-05-14 — End: 2023-09-03
  Filled 2023-05-14: qty 90, 90d supply, fill #0

## 2023-05-14 MED ORDER — VITAMIN D (ERGOCALCIFEROL) 1.25 MG (50000 UNIT) PO CAPS
50000.0000 [IU] | ORAL_CAPSULE | ORAL | 0 refills | Status: DC
Start: 2023-05-14 — End: 2023-06-04
  Filled 2023-05-14: qty 4, 28d supply, fill #0

## 2023-05-14 NOTE — Progress Notes (Signed)
.smr  Office: [phone]  /  Fax: [phone]  WEIGHT SUMMARY AND BIOMETRICS  Anthropometric Measurements Height: 5\' 2"  (1.575 m) Weight: 194 lb (88 kg) BMI (Calculated): 35.47 Weight at Last Visit: 191 lb Weight Lost Since Last Visit: 0 Weight Gained Since Last Visit: 3 lb   Body Composition  Body Fat %: 38.6 % Fat Mass (lbs): 75 lbs Muscle Mass (lbs): 113.4 lbs Total Body Water (lbs): 73.2 lbs Visceral Fat Rating : 9   Other Clinical Data Fasting: No Labs: No Today's Visit #: 16  Discussed the use of AI scribe software for clinical note transcription with the patient, who gave verbal consent to proceed.  Chief Complaint: OBESITY  Patricia Lin is here to discuss her progress with her obesity treatment plan. She is on the 1 and states she is following her eating plan approximately 0 % of the time. She states she is exercising 0 minutes 0 times per week.   History of Present Illness   [name], a patient with a history of prediabetes, vitamin D deficiency, and obesity, recently started a new job. The stress of job hunting had led to increased eating and weight gain. The patient also reported shoulder pain, which was treated with an injection. However, the pain returned the following day, leading the patient to seek chiropractic treatment. The chiropractor recommended treatment three times a week for four weeks due to tightness in the shoulder.  The patient also reported a recent weight gain of three pounds, which caused distress and elevated blood pressure. The patient admitted to not taking prescribed metformin and vitamin D due to stress. The patient's recent A1c results showed a slight increase, indicating a need for better medication adherence.  The patient also reported a slightly elevated liver enzyme (ALT), which was discussed with their primary care provider. The patient was advised to return in four weeks for a recheck. The patient was informed that the slight elevation  could be due to fatty liver, often a sign of weight gain. The patient was also informed about the potential [place]-term effects of fatty liver, including liver damage and liver failure.  The patient expressed interest in taking Ozempic, a medication covered by their new insurance. However, the patient was informed that the insurance company might only cover the medication for full-blown diabetes, not prediabetes. The patient was advised to confirm this with the insurance company. The patient was also advised to continue journaling, maintain a diet of 1000-1200 calories with at least 75g of protein, and to refill their metformin and vitamin D prescriptions.          PHYSICAL EXAM:  Blood pressure 123/76, pulse 73, temperature 97.8 F (36.6 C), height 5\' 2"  (1.575 m), weight 194 lb (88 kg), last menstrual period 04/26/2023, SpO2 98 %. Body mass index is 35.48 kg/m.  DIAGNOSTIC DATA REVIEWED:  BMET    Component Value Date/Time   NA 140 05/08/2023 0000   K 4.1 05/08/2023 0000   CL 104 05/08/2023 0000   CO2 23 05/08/2023 0000   GLUCOSE 96 05/08/2023 0000   GLUCOSE 105 (H) 08/30/2022 1922   BUN 14 05/08/2023 0000   CREATININE 0.74 05/08/2023 0000   CREATININE 0.90 05/07/2022 1403   CREATININE 0.64 01/17/2013 1720   CALCIUM 9.3 05/08/2023 0000   GFRNONAA >60 08/30/2022 1922   GFRNONAA >60 05/07/2022 1403   GFRAA >60 09/04/2019 0130   Lab Results  Component Value Date   HGBA1C 5.6 05/08/2023   HGBA1C 5.2 09/08/2018   Lab  Results  Component Value Date   INSULIN 11.7 11/11/2022   INSULIN 11.5 09/08/2018   Lab Results  Component Value Date   TSH 1.280 05/08/2023   CBC    Component Value Date/Time   WBC 3.9 (L) 01/06/2023 1510   WBC 4.3 10/01/2022 0842   RBC 4.40 01/06/2023 1510   HGB 12.1 01/06/2023 1510   HGB 12.2 07/29/2022 1445   HCT 36.9 01/06/2023 1510   HCT 39.0 07/29/2022 1445   PLT 296 01/06/2023 1510   PLT 218 07/29/2022 1445   MCV 83.9 01/06/2023 1510   MCV  84 07/29/2022 1445   MCH 27.5 01/06/2023 1510   MCHC 32.8 01/06/2023 1510   RDW 14.2 01/06/2023 1510   RDW 17.2 (H) 07/29/2022 1445   Iron Studies    Component Value Date/Time   IRON 73 01/06/2023 1510   IRON 82 11/11/2022 1017   TIBC 319 01/06/2023 1510   TIBC 310 11/11/2022 1017   FERRITIN 50 01/06/2023 1511   FERRITIN 17 09/08/2019 1512   IRONPCTSAT 23 01/06/2023 1510   IRONPCTSAT 26 11/11/2022 1017   Lipid Panel     Component Value Date/Time   CHOL 192 05/08/2023 0000   TRIG 79 05/08/2023 0000   HDL 80 05/08/2023 0000   CHOLHDL 2.4 05/08/2023 0000   LDLCALC 98 05/08/2023 0000   Hepatic Function Panel     Component Value Date/Time   PROT 7.2 05/08/2023 0000   ALBUMIN 4.2 05/08/2023 0000   AST 30 05/08/2023 0000   AST 24 05/07/2022 1403   ALT 35 (H) 05/08/2023 0000   ALT 27 05/07/2022 1403   ALKPHOS 91 05/08/2023 0000   BILITOT 0.5 05/08/2023 0000   BILITOT 0.6 05/07/2022 1403      Component Value Date/Time   TSH 1.280 05/08/2023 0000   Nutritional Lab Results  Component Value Date   VD25OH 23.2 (L) 11/11/2022   VD25OH 22.2 (L) 06/11/2022   VD25OH 9.6 (L) 09/08/2018     Assessment and Plan    Obesity: Recent weight gain due to stress and poor dietary habits. Discussed the importance of meal planning and incorporating more vegetables into the diet. -Continue with journaling, aim for 1000-1200 calories per day with at least 75g of protein. -Encouraged to increase intake of raw and cooked vegetables to promote beneficial gut bacteria.  Prediabetes: Recent A1c showed slight increase, patient has not been taking metformin consistently. -Resume metformin, refill prescription. -Check A1c in a month.  Vitamin D deficiency: Last level was low, primary care provider did not check recent level. -Refill Vitamin D prescription. -Check Vitamin D level in a month.  Shoulder pain: Recent corticosteroid injection did not provide lasting relief, patient has started  seeing a chiropractor. -Continue chiropractic treatment 3 times a week for 4 weeks.  Elevated liver enzymes: Slight elevation in ALT, likely due to fatty liver from recent weight gain. Patient to have repeat testing in 4 weeks with primary care provider. -Monitor ALT levels.  Follow-up in 3-4 weeks to assess progress. If needed, patient to contact via MyChart.         I have personally spent 40 minutes total time today in preparation, patient care, and documentation for this visit, including the following: review of clinical lab tests; review of medical tests/procedures/services.    She was informed of the importance of frequent follow up visits to maximize her success with intensive lifestyle modifications for her multiple health conditions. Return in about 4 weeks (around 06/11/2023).   Mechele Kittleson  Dalbert Garnet, MD

## 2023-05-27 ENCOUNTER — Other Ambulatory Visit (HOSPITAL_COMMUNITY): Payer: Self-pay

## 2023-06-04 ENCOUNTER — Ambulatory Visit (INDEPENDENT_AMBULATORY_CARE_PROVIDER_SITE_OTHER): Payer: Medicaid Other | Admitting: Family Medicine

## 2023-06-04 ENCOUNTER — Encounter (INDEPENDENT_AMBULATORY_CARE_PROVIDER_SITE_OTHER): Payer: Self-pay | Admitting: Family Medicine

## 2023-06-04 VITALS — BP 154/87 | HR 65 | Temp 98.8°F | Ht 62.0 in | Wt 192.0 lb

## 2023-06-04 DIAGNOSIS — E669 Obesity, unspecified: Secondary | ICD-10-CM

## 2023-06-04 DIAGNOSIS — Z6835 Body mass index (BMI) 35.0-35.9, adult: Secondary | ICD-10-CM

## 2023-06-04 DIAGNOSIS — R7303 Prediabetes: Secondary | ICD-10-CM | POA: Diagnosis not present

## 2023-06-04 DIAGNOSIS — R030 Elevated blood-pressure reading, without diagnosis of hypertension: Secondary | ICD-10-CM

## 2023-06-04 DIAGNOSIS — E559 Vitamin D deficiency, unspecified: Secondary | ICD-10-CM | POA: Diagnosis not present

## 2023-06-04 MED ORDER — VITAMIN D (ERGOCALCIFEROL) 1.25 MG (50000 UNIT) PO CAPS: 50000.0000 [IU] | ORAL_CAPSULE | ORAL | 0 refills | Status: DC

## 2023-06-04 NOTE — Addendum Note (Signed)
Addended by: Theola Sequin on: 06/04/2023 03:48 PM   Modules accepted: Level of Service

## 2023-06-04 NOTE — Progress Notes (Signed)
.smr  Office: [phone]  /  Fax: [phone]  WEIGHT SUMMARY AND BIOMETRICS  Anthropometric Measurements Height: 5\' 2"  (1.575 m) Weight: 192 lb (87.1 kg) BMI (Calculated): 35.11 Weight at Last Visit: 194 lb Weight Lost Since Last Visit: 2 lb Weight Gained Since Last Visit: 0 Starting Weight: 191 lb Total Weight Loss (lbs): 0 lb (0 kg) Peak Weight: 203 lb   Body Composition  Body Fat %: 37.1 % Fat Mass (lbs): 71.4 lbs Muscle Mass (lbs): 115 lbs Total Body Water (lbs): 76 lbs Visceral Fat Rating : 9   Other Clinical Data Fasting: no Labs: no Today's Visit #: 17 Starting Date: 09/08/18    Chief Complaint: OBESITY   Discussed the use of AI scribe software for clinical note transcription with the patient, who gave verbal consent to proceed.  History of Present Illness   The patient is a 42 year old individual with a history of obesity and prediabetes. She has been struggling to adhere to the category one plan and has been attempting to increase their physical activity by walking for 15-20 minutes three to four times per week. However, recent personal tragedy, the unexpected death of their brother-in-law, has disrupted their routine and led to a period of comfort eating. Despite this, she has managed to lose two pounds, which they attribute to the grieving process.  The patient's blood pressure was noted to be elevated at 145/84, which she believes is due to the stress of their current situation. She has been experiencing disrupted sleep patterns, staying awake to support their husband, the deceased's brother.  Regarding their medication, the patient is currently taking metformin and vitamin D, but has not picked up their most recent prescription. They also mentioned that their insurance requires a diagnosis of diabetes for coverage of Zymbic, a medication previously discussed.  The patient expressed a desire to continue attending appointments and working on their health,  acknowledging that some weeks may be better than others due to their ongoing grieving process.          PHYSICAL EXAM:  Blood pressure (!) 154/87, pulse 65, temperature 98.8 F (37.1 C), height 5\' 2"  (1.575 m), weight 192 lb (87.1 kg), last menstrual period 04/26/2023, SpO2 100 %. Body mass index is 35.12 kg/m.  DIAGNOSTIC DATA REVIEWED:  BMET    Component Value Date/Time   NA 140 05/08/2023 0000   K 4.1 05/08/2023 0000   CL 104 05/08/2023 0000   CO2 23 05/08/2023 0000   GLUCOSE 96 05/08/2023 0000   GLUCOSE 105 (H) 08/30/2022 1922   BUN 14 05/08/2023 0000   CREATININE 0.74 05/08/2023 0000   CREATININE 0.90 05/07/2022 1403   CREATININE 0.64 01/17/2013 1720   CALCIUM 9.3 05/08/2023 0000   GFRNONAA >60 08/30/2022 1922   GFRNONAA >60 05/07/2022 1403   GFRAA >60 09/04/2019 0130   Lab Results  Component Value Date   HGBA1C 5.6 05/08/2023   HGBA1C 5.2 09/08/2018   Lab Results  Component Value Date   INSULIN 11.7 11/11/2022   INSULIN 11.5 09/08/2018   Lab Results  Component Value Date   TSH 1.280 05/08/2023   CBC    Component Value Date/Time   WBC 3.9 (L) 01/06/2023 1510   WBC 4.3 10/01/2022 0842   RBC 4.40 01/06/2023 1510   HGB 12.1 01/06/2023 1510   HGB 12.2 07/29/2022 1445   HCT 36.9 01/06/2023 1510   HCT 39.0 07/29/2022 1445   PLT 296 01/06/2023 1510   PLT 218 07/29/2022 1445   MCV  83.9 01/06/2023 1510   MCV 84 07/29/2022 1445   MCH 27.5 01/06/2023 1510   MCHC 32.8 01/06/2023 1510   RDW 14.2 01/06/2023 1510   RDW 17.2 (H) 07/29/2022 1445   Iron Studies    Component Value Date/Time   IRON 73 01/06/2023 1510   IRON 82 11/11/2022 1017   TIBC 319 01/06/2023 1510   TIBC 310 11/11/2022 1017   FERRITIN 50 01/06/2023 1511   FERRITIN 17 09/08/2019 1512   IRONPCTSAT 23 01/06/2023 1510   IRONPCTSAT 26 11/11/2022 1017   Lipid Panel     Component Value Date/Time   CHOL 192 05/08/2023 0000   TRIG 79 05/08/2023 0000   HDL 80 05/08/2023 0000   CHOLHDL  2.4 05/08/2023 0000   LDLCALC 98 05/08/2023 0000   Hepatic Function Panel     Component Value Date/Time   PROT 7.2 05/08/2023 0000   ALBUMIN 4.2 05/08/2023 0000   AST 30 05/08/2023 0000   AST 24 05/07/2022 1403   ALT 35 (H) 05/08/2023 0000   ALT 27 05/07/2022 1403   ALKPHOS 91 05/08/2023 0000   BILITOT 0.5 05/08/2023 0000   BILITOT 0.6 05/07/2022 1403      Component Value Date/Time   TSH 1.280 05/08/2023 0000   Nutritional Lab Results  Component Value Date   VD25OH 23.2 (L) 11/11/2022   VD25OH 22.2 (L) 06/11/2022   VD25OH 9.6 (L) 09/08/2018     Assessment and Plan    Obesity and Insulin Resistance: Difficulty adhering to category one plan due to recent family tragedy. Blood pressure elevated at 145/84. Patient has been walking for exercise. Lost 2 pounds since last visit. -Encouraged to continue walking early in the morning or late at night. -Encouraged to maintain protein intake. -Continue Metformin. -Check labs including Vitamin D level in one month.  Vitamin D Deficiency: Last Vitamin D level was 23 around the holidays. -Continue Vitamin D 50,000 international units once weekly. -Refill Vitamin D prescription.  Grief: Recent unexpected loss of brother-in-law. Patient is in the comfort eating phase of grief. -Encouraged to continue walking for mental clarity. -Encouraged to prioritize quality sleep. -Plan to follow up in 4 weeks.        She was informed of the importance of frequent follow up visits to maximize her success with intensive lifestyle modifications for her multiple health conditions.    Quillian Quince, MD

## 2023-07-01 ENCOUNTER — Encounter (INDEPENDENT_AMBULATORY_CARE_PROVIDER_SITE_OTHER): Payer: Self-pay | Admitting: Family Medicine

## 2023-07-01 ENCOUNTER — Ambulatory Visit (INDEPENDENT_AMBULATORY_CARE_PROVIDER_SITE_OTHER): Payer: No Typology Code available for payment source | Admitting: Family Medicine

## 2023-07-01 VITALS — BP 133/89 | HR 81 | Temp 98.2°F | Ht 62.0 in | Wt 192.0 lb

## 2023-07-01 DIAGNOSIS — G4709 Other insomnia: Secondary | ICD-10-CM | POA: Diagnosis not present

## 2023-07-01 DIAGNOSIS — E669 Obesity, unspecified: Secondary | ICD-10-CM

## 2023-07-01 DIAGNOSIS — R7303 Prediabetes: Secondary | ICD-10-CM | POA: Diagnosis not present

## 2023-07-01 DIAGNOSIS — Z6835 Body mass index (BMI) 35.0-35.9, adult: Secondary | ICD-10-CM

## 2023-07-01 NOTE — Progress Notes (Unsigned)
Chief Complaint:   OBESITY Bobbyjo is here to discuss her progress with her obesity treatment plan along with follow-up of her obesity related diagnoses. Rubie is on keeping a food journal and adhering to recommended goals of 1000-1200 calories and 75+ grams of protein and states she is following her eating plan approximately 0% of the time. Tressia states she is doing 0 minutes 0 times per week.  Today's visit was #: 18 Starting weight: 191 lbs Starting date: 09/08/2018 Today's weight: 192 lbs Today's date: 07/01/2023 Total lbs lost to date: 0 Total lbs lost since last in-office visit: 0  Interim History: Patient has not been able to follow a structured eating plan.  She is still grieving the loss of her brother-in-law, and she is not able to concentrate on her weight loss.  She has done well with avoiding weight gain however.  Subjective:   1. Other insomnia Patient is not sleeping well, and she is averaging approximately 4 hours per night with no naps.  She feels her mind is racing.  She has not tried any OTC sleep supplements.  2. Prediabetes Patient is missing doses of her metformin, and she is struggling with following an eating plan.  Assessment/Plan:   1. Other insomnia Patient agreed to start melatonin or magnesium OTC.  We will follow-up at her next visit.  2. Prediabetes Patient will continue metformin and will work on taking it regularly.  She will get back to an eating plan when she is ready to concentrate on this again.  3. BMI 35.0-35.9,adult  4. Obesity, Beginning BMI 34.93 Lestine is currently in the action stage of change. As such, her goal is to continue with weight loss efforts. She has agreed to practicing portion control and making smarter food choices, such as increasing vegetables and decreasing simple carbohydrates.   Behavioral modification strategies: increasing lean protein intake and no skipping meals.  Marlette has agreed to follow-up with our  clinic in 4 weeks. She was informed of the importance of frequent follow-up visits to maximize her success with intensive lifestyle modifications for her multiple health conditions.   Objective:   Blood pressure 133/89, pulse 81, temperature 98.2 F (36.8 C), height 5\' 2"  (1.575 m), weight 192 lb (87.1 kg), SpO2 100%. Body mass index is 35.12 kg/m.  Lab Results  Component Value Date   CREATININE 0.74 05/08/2023   BUN 14 05/08/2023   NA 140 05/08/2023   K 4.1 05/08/2023   CL 104 05/08/2023   CO2 23 05/08/2023   Lab Results  Component Value Date   ALT 35 (H) 05/08/2023   AST 30 05/08/2023   ALKPHOS 91 05/08/2023   BILITOT 0.5 05/08/2023   Lab Results  Component Value Date   HGBA1C 5.6 05/08/2023   HGBA1C 5.2 11/11/2022   HGBA1C 5.2 10/01/2022   HGBA1C 5.5 05/02/2022   HGBA1C 5.2 09/08/2018   Lab Results  Component Value Date   INSULIN 11.7 11/11/2022   INSULIN 15.1 06/11/2022   INSULIN 11.5 09/08/2018   Lab Results  Component Value Date   TSH 1.280 05/08/2023   Lab Results  Component Value Date   CHOL 192 05/08/2023   HDL 80 05/08/2023   LDLCALC 98 05/08/2023   TRIG 79 05/08/2023   CHOLHDL 2.4 05/08/2023   Lab Results  Component Value Date   VD25OH 23.2 (L) 11/11/2022   VD25OH 22.2 (L) 06/11/2022   VD25OH 9.6 (L) 09/08/2018   Lab Results  Component Value Date  WBC 3.9 (L) 01/06/2023   HGB 12.1 01/06/2023   HCT 36.9 01/06/2023   MCV 83.9 01/06/2023   PLT 296 01/06/2023   Lab Results  Component Value Date   IRON 73 01/06/2023   TIBC 319 01/06/2023   FERRITIN 50 01/06/2023   Attestation Statements:   Reviewed by clinician on day of visit: allergies, medications, problem list, medical history, surgical history, family history, social history, and previous encounter notes.  Time spent on visit including pre-visit chart review and post-visit care and charting was 30 minutes.   I, Burt Knack, am acting as transcriptionist for Quillian Quince,  MD.  I have reviewed the above documentation for accuracy and completeness, and I agree with the above. -  Quillian Quince, MD

## 2023-07-06 ENCOUNTER — Other Ambulatory Visit: Payer: Self-pay

## 2023-07-06 DIAGNOSIS — D509 Iron deficiency anemia, unspecified: Secondary | ICD-10-CM

## 2023-07-07 ENCOUNTER — Other Ambulatory Visit: Payer: Self-pay

## 2023-07-07 ENCOUNTER — Inpatient Hospital Stay (HOSPITAL_BASED_OUTPATIENT_CLINIC_OR_DEPARTMENT_OTHER): Payer: No Typology Code available for payment source | Admitting: Internal Medicine

## 2023-07-07 ENCOUNTER — Inpatient Hospital Stay: Payer: No Typology Code available for payment source | Attending: Internal Medicine

## 2023-07-07 VITALS — BP 132/89 | HR 81 | Temp 97.7°F | Resp 18 | Wt 196.6 lb

## 2023-07-07 DIAGNOSIS — D509 Iron deficiency anemia, unspecified: Secondary | ICD-10-CM

## 2023-07-07 DIAGNOSIS — D500 Iron deficiency anemia secondary to blood loss (chronic): Secondary | ICD-10-CM | POA: Diagnosis present

## 2023-07-07 DIAGNOSIS — E611 Iron deficiency: Secondary | ICD-10-CM | POA: Diagnosis not present

## 2023-07-07 DIAGNOSIS — N920 Excessive and frequent menstruation with regular cycle: Secondary | ICD-10-CM | POA: Insufficient documentation

## 2023-07-07 LAB — COMPREHENSIVE METABOLIC PANEL
ALT: 17 U/L (ref 0–44)
AST: 16 U/L (ref 15–41)
Albumin: 4.4 g/dL (ref 3.5–5.0)
Alkaline Phosphatase: 72 U/L (ref 38–126)
Anion gap: 6 (ref 5–15)
BUN: 18 mg/dL (ref 6–20)
CO2: 25 mmol/L (ref 22–32)
Calcium: 9.6 mg/dL (ref 8.9–10.3)
Chloride: 108 mmol/L (ref 98–111)
Creatinine, Ser: 0.86 mg/dL (ref 0.44–1.00)
GFR, Estimated: >60 mL/min (ref >60)
Glucose, Bld: 92 mg/dL (ref 70–99)
Potassium: 4 mmol/L (ref 3.5–5.1)
Sodium: 139 mmol/L (ref 135–145)
Total Bilirubin: 0.5 mg/dL (ref 0.3–1.2)
Total Protein: 7.6 g/dL (ref 6.5–8.1)

## 2023-07-07 LAB — CBC WITH DIFFERENTIAL/PLATELET
Abs Immature Granulocytes: 0.01 10*3/uL (ref 0.00–0.07)
Basophils Absolute: 0 10*3/uL (ref 0.0–0.1)
Basophils Relative: 1 %
Eosinophils Absolute: 0 10*3/uL (ref 0.0–0.5)
Eosinophils Relative: 1 %
HCT: 27 % — ABNORMAL LOW (ref 36.0–46.0)
Hemoglobin: 7.9 g/dL — ABNORMAL LOW (ref 12.0–15.0)
Immature Granulocytes: 0 %
Lymphocytes Relative: 41 %
Lymphs Abs: 1.6 10*3/uL (ref 0.7–4.0)
MCH: 20 pg — ABNORMAL LOW (ref 26.0–34.0)
MCHC: 29.3 g/dL — ABNORMAL LOW (ref 30.0–36.0)
MCV: 68.4 fL — ABNORMAL LOW (ref 80.0–100.0)
Monocytes Absolute: 0.3 10*3/uL (ref 0.1–1.0)
Monocytes Relative: 8 %
Neutro Abs: 1.9 10*3/uL (ref 1.7–7.7)
Neutrophils Relative %: 49 %
Platelets: 383 10*3/uL (ref 150–400)
RBC: 3.95 MIL/uL (ref 3.87–5.11)
RDW: 17.8 % — ABNORMAL HIGH (ref 11.5–15.5)
WBC: 3.9 10*3/uL — ABNORMAL LOW (ref 4.0–10.5)
nRBC: 0 % (ref 0.0–0.2)

## 2023-07-07 LAB — FOLATE: Folate: 6.5 ng/mL (ref >5.9)

## 2023-07-07 NOTE — Progress Notes (Signed)
[facility] Telephone:([phone]) [phone]   [phone]  OFFICE PROGRESS NOTE  Patricia Fendt, NP [address] [place] [address]  DIAGNOSIS: Iron deficiency anemia secondary to menorrhagia.  PRIOR THERAPY: Ferrous sulfate 325 mg p.o. 3 times daily with no improvement.  CURRENT THERAPY: Venofer 300 Mg IV weekly for 3 weeks.  Next dose July 16, 2023  INTERVAL HISTORY: Patricia Lin 42 y.o. female returns to the clinic today for follow-up visit.  The patient is feeling fine today with no concerning complaints except for the fatigue that increased recently.  She is currently having her menstrual cycle.  She denied having any chest pain but has mild shortness of breath with exertion with no cough or hemoptysis.  She has no nausea, vomiting, diarrhea or constipation.  She has no headache or visual changes.  She is here today for evaluation with repeat blood work.  MEDICAL HISTORY: Past Medical History:  Diagnosis Date   Abnormal Pap smear    Adenomyosis    Anemia    Anxiety    Back pain    BV (bacterial vaginosis) 2011   Chlamydia infection    CIN I (cervical intraepithelial neoplasia I) 2008   Depression    Dysmenorrhea    Dyspnea    Dysrhythmia 2009   TACHYCARDIA;   HAD W/U 2010? 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  B/P nl, and 122/76 on repeat-sitting   Postpartum depression 2011   NO MEDS   Pregnancy induced hypertension    ALL PREGNANCIES   Shortness of breath    WITH EXERTION SICNE PREGNANCY   Tachycardia    Tachycardia 2010   HAS HAD WORKUP. UNSURE IF HAS POSSIBLE HEART VALVE ISSUE   Vulvitis 2009    ALLERGIES:  is allergic to latex.  MEDICATIONS:  Current Outpatient Medications  Medication Sig Dispense Refill   ferrous sulfate 325 (65 FE) MG tablet TAKE 1 TABLET BY MOUTH 3 TIMES DAILY WITH MEALS. 90 tablet 3   Insulin Pen Needle (BD PEN NEEDLE NANO 2ND GEN) 32G X 4 MM MISC Use 1 needle daily to inject medication. 100 each 0   metFORMIN (GLUCOPHAGE) 500 MG tablet Take 1/2 tablet (250 mg total) by mouth 2 (two) times daily, with breakfast and lunch 90 tablet 0   Vitamin D, Ergocalciferol, (DRISDOL) 1.25 MG (50000 UNIT) CAPS capsule Take 1 capsule (50,000 Units total) by mouth every 7 (seven) days. 4 capsule 0   Current Facility-Administered Medications  Medication Dose Route Frequency Provider Last Rate Last Admin   triamcinolone acetonide (KENALOG-40) injection 40 mg  40 mg Intramuscular Once Zonia Kief, Amy J,  NP        SURGICAL HISTORY:  Past Surgical History:  Procedure Laterality Date   BREAST REDUCTION SURGERY  12/08/2006   BREAST SURGERY     CERVICAL BIOPSY  W/ LOOP ELECTRODE EXCISION  2010   DILATION AND CURETTAGE OF UTERUS     FINGER SURGERY     Right index finger   TUBAL LIGATION  01/09/2013   Procedure: POST PARTUM TUBAL LIGATION;  Surgeon: Michael Litter, MD;  Location: [facility];  Service: Gynecology;  Laterality: Bilateral;  post partum tubal ligation bilateral   US ECHOCARDIOGRAPHY  06/18/2009   ef 55-60%   WISDOM TOOTH EXTRACTION      REVIEW OF SYSTEMS:  A comprehensive review of systems was negative except for: Constitutional: positive for fatigue   PHYSICAL EXAMINATION: General appearance: alert, cooperative, fatigued, and no distress Head: Normocephalic,  without obvious abnormality, atraumatic Neck: no adenopathy, no JVD, supple, symmetrical, trachea midline, and thyroid not enlarged, symmetric, no tenderness/mass/nodules Lymph nodes: Cervical, supraclavicular, and axillary nodes normal. Resp: clear to auscultation bilaterally Back: symmetric, no curvature. ROM normal. No CVA tenderness. Cardio: regular rate and rhythm, S1, S2 normal, no murmur, click, rub or gallop GI: soft, non-tender; bowel sounds normal; no masses,  no organomegaly Extremities: extremities normal, atraumatic, no cyanosis or edema  ECOG PERFORMANCE STATUS: 1 - Symptomatic but completely ambulatory  Blood pressure 132/89, pulse 81, temperature 97.7 F (36.5 C), temperature source Temporal, resp. rate 18, weight 196 lb 9.6 oz (89.2 kg), SpO2 100%.  LABORATORY DATA: Lab Results  Component Value Date   WBC 3.9 (L) 07/07/2023   HGB 7.9 (L) 07/07/2023   HCT 27.0 (L) 07/07/2023   MCV 68.4 (L) 07/07/2023   PLT 383 07/07/2023      Chemistry      Component Value Date/Time   NA 139 07/07/2023 1458   NA 140 05/08/2023 0000   K 4.0 07/07/2023 1458   CL 108 07/07/2023 1458   CO2 25 07/07/2023 1458   BUN 18 07/07/2023 1458   BUN 14 05/08/2023 0000   CREATININE 0.86 07/07/2023 1458   CREATININE 0.90 05/07/2022 1403   CREATININE 0.64 01/17/2013 1720      Component Value Date/Time   CALCIUM 9.6 07/07/2023 1458   ALKPHOS 72 07/07/2023 1458   AST 16 07/07/2023 1458   AST 24 05/07/2022 1403   ALT 17 07/07/2023 1458   ALT 27 05/07/2022 1403   BILITOT 0.5 07/07/2023 1458   BILITOT 0.5 05/08/2023 0000   BILITOT 0.6 05/07/2022 1403       RADIOGRAPHIC STUDIES: No results found.  ASSESSMENT AND PLAN: This is a very pleasant 42 years old African-American female with history of iron deficiency anemia secondary to menorrhagia and not responding to the oral iron tablets.  The patient was treated recently with iron infusion with Venofer 500 Mg IV weekly for 2 weeks. She  has been on oral iron tablets and tolerating it well but not effective in controlling her anemia. Repeat CBC today showed significant drop of her hemoglobin down to 7.9 and hematocrit 27.0% with MCV of 68.4. Ferritin level still pending. I recommended for the patient to proceed with iron infusion with Venofer 300 Mg IV weekly for 3 weeks starting July 16, 2023. I will see the patient back for follow-up visit in 3 months for evaluation and repeat blood work. She was advised to call immediately if she has any other concerning symptoms in the interval. The patient voices understanding of current disease  status and treatment options and is in agreement with the current care plan.  All questions were answered. The patient knows to call the clinic with any problems, questions or concerns. We can certainly see the patient much sooner if necessary.  The total time spent in the appointment was 20 minutes.  Disclaimer: This note was dictated with voice recognition software. Similar sounding words can inadvertently be transcribed and may not be corrected upon review.

## 2023-07-10 ENCOUNTER — Telehealth: Payer: Self-pay | Admitting: Pharmacy Technician

## 2023-07-10 ENCOUNTER — Other Ambulatory Visit: Payer: Self-pay

## 2023-07-10 NOTE — Telephone Encounter (Signed)
Auth Submission: NO AUTH NEEDED Site of care: Site of care: [facility] Payer: [employer] Medication & CPT/J Code(s) submitted: Venofer (Iron Sucrose) J1756 Route of submission (phone, fax, portal):  Phone # Fax # Auth type: Buy/Bill PB Units/visits requested: 3 Reference number:  Approval from: 07/10/23 to 11/09/23

## 2023-07-11 ENCOUNTER — Telehealth: Payer: No Typology Code available for payment source | Admitting: Family Medicine

## 2023-07-11 DIAGNOSIS — D649 Anemia, unspecified: Secondary | ICD-10-CM | POA: Diagnosis not present

## 2023-07-11 NOTE — Patient Instructions (Signed)

## 2023-07-11 NOTE — Progress Notes (Signed)
Virtual Visit Consent   Patricia Lin, you are scheduled for a virtual visit with a [facility] provider today. Just as with appointments in the office, your consent must be obtained to participate. Your consent will be active for this visit and any virtual visit you may have with one of our providers in the next 365 days. If you have a MyChart account, a copy of this consent can be sent to you electronically.  As this is a virtual visit, video technology does not allow for your provider to perform a traditional examination. This may limit your provider's ability to fully assess your condition. If your provider identifies any concerns that need to be evaluated in person or the need to arrange testing (such as labs, EKG, etc.), we will make arrangements to do so. Although advances in technology are sophisticated, we cannot ensure that it will always work on either your end or our end. If the connection with a video visit is poor, the visit may have to be switched to a telephone visit. With either a video or telephone visit, we are not always able to ensure that we have a secure connection.  By engaging in this virtual visit, you consent to the provision of healthcare and authorize for your insurance to be billed (if applicable) for the services provided during this visit. Depending on your insurance coverage, you may receive a charge related to this service.  I need to obtain your verbal consent now. Are you willing to proceed with your visit today? Chanette N Shappell has provided verbal consent on 07/11/2023 for a virtual visit (video or telephone). Georgana Curio, FNP  Date: 07/11/2023 1:04 PM  Virtual Visit via Video Note   I, Georgana Curio, connected with  Patricia Lin  ([id number], 1981/12/01) on 07/11/23 at  1:00 PM EDT by a video-enabled telemedicine application and verified that I am speaking with the correct person using two identifiers.  Location: Patient: Virtual Visit Location Patient: Home Provider:  Virtual Visit Location Provider: Home Office   I discussed the limitations of evaluation and management by telemedicine and the availability of in person appointments. The patient expressed understanding and agreed to proceed.    History of Present Illness: Patricia Lin is a 42 y.o. who identifies as a female who was assigned female at birth, and is being seen today for anemia. She is on oral iron tid. She is sob at times. She is concerned she does not need to wait until thurs for iron infusion. Marland Kitchen  HPI: HPI  Problems:  Patient Active Problem List   Diagnosis Date Noted   Other insomnia 07/01/2023   BMI 35.0-35.9,adult 01/22/2023   Obesity, Beginning BMI 34.93 01/22/2023   Binge-Eating Disorder, Mild 09/29/2022   Prediabetes 08/12/2022   Depression 08/12/2022   Class 1 obesity with serious comorbidity and body mass index (BMI) of 34.0 to 34.9 in adult 08/12/2022   Iron deficiency anemia 07/29/2022   Insulin resistance 06/28/2022   Essential hypertension 06/28/2022   Iron deficiency 06/28/2022   Vitamin D deficiency 06/28/2022   Other hyperlipidemia 06/28/2022   At risk of diabetes mellitus 06/28/2022   Iron deficiency anemia due to chronic blood loss 05/07/2022   Right lower quadrant abdominal pain 01/28/2019   Generalized headaches 12/17/2018   Anxiety 12/17/2018   History of anemia 05/26/2018   Anemia 12/05/2017   Routine health maintenance 03/03/2013   S/P tubal ligation 02/09/2013   Vaginal delivery 01/09/2013   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  Fracture of left ulna 09/27/12 10/23/2012   Cervical funneling 09/18/2012   Cervical shortening 09/18/2012   Symptomatic anemia 09/03/2012   H/O pre-eclampsia in prior pregnancy, currently pregnant 08/19/2012   Chronic hypertension in pregnancy 08/19/2012   Hx LEEP (loop electrosurgical excision procedure), cervix, pregnancy 08/19/2012   Hx of postpartum depression, currently pregnant 08/19/2012   Late prenatal care 08/19/2012   Hx of  precipitous labor and deliveries, antepartum 08/19/2012   Muscle tension HAs 08/19/2012    Allergies:  Allergies  Allergen Reactions   Latex Itching and Rash   Medications:  Current Outpatient Medications:    ferrous sulfate 325 (65 FE) MG tablet, TAKE 1 TABLET BY MOUTH 3 TIMES DAILY WITH MEALS., Disp: 90 tablet, Rfl: 3   Insulin Pen Needle (BD PEN NEEDLE NANO 2ND GEN) 32G X 4 MM MISC, Use 1 needle daily to inject medication., Disp: 100 each, Rfl: 0   metFORMIN (GLUCOPHAGE) 500 MG tablet, Take 1/2 tablet (250 mg total) by mouth 2 (two) times daily, with breakfast and lunch, Disp: 90 tablet, Rfl: 0   Vitamin D, Ergocalciferol, (DRISDOL) 1.25 MG (50000 UNIT) CAPS capsule, Take 1 capsule (50,000 Units total) by mouth every 7 (seven) days., Disp: 4 capsule, Rfl: 0  Current Facility-Administered Medications:    triamcinolone acetonide (KENALOG-40) injection 40 mg, 40 mg, Intramuscular, Once, Rema Fendt, NP  Observations/Objective: Patient is well-developed, well-nourished in no acute distress.  Resting comfortably  at home.  Head is normocephalic, atraumatic.  No labored breathing.  Speech is clear and coherent with logical content.  Patient is alert and oriented at baseline.    Assessment and Plan: 1. Symptomatic anemia  Go to UC for SOB and other worsening sx.   Follow Up Instructions: I discussed the assessment and treatment plan with the patient. The patient was provided an opportunity to ask questions and all were answered. The patient agreed with the plan and demonstrated an understanding of the instructions.  A copy of instructions were sent to the patient via MyChart unless otherwise noted below.     The patient was advised to call back or seek an in-person evaluation if the symptoms worsen or if the condition fails to improve as anticipated.  Time:  I spent 8 minutes with the patient via telehealth technology discussing the above problems/concerns.    Georgana Curio,  FNP

## 2023-07-16 ENCOUNTER — Ambulatory Visit (INDEPENDENT_AMBULATORY_CARE_PROVIDER_SITE_OTHER): Payer: No Typology Code available for payment source

## 2023-07-16 VITALS — BP 130/80 | HR 80 | Temp 98.3°F | Resp 18 | Ht 62.0 in | Wt 201.8 lb

## 2023-07-16 DIAGNOSIS — N920 Excessive and frequent menstruation with regular cycle: Secondary | ICD-10-CM | POA: Diagnosis not present

## 2023-07-16 DIAGNOSIS — D500 Iron deficiency anemia secondary to blood loss (chronic): Secondary | ICD-10-CM

## 2023-07-16 MED ORDER — SODIUM CHLORIDE 0.9 % IV SOLN
300.0000 mg | INTRAVENOUS | Status: DC
  Administered 2023-07-16: 300 mg via INTRAVENOUS
  Filled 2023-07-16: qty 15

## 2023-07-16 MED ORDER — ACETAMINOPHEN 325 MG PO TABS
650.0000 mg | ORAL_TABLET | Freq: Once | ORAL | Status: AC
  Administered 2023-07-16: 650 mg via ORAL
  Filled 2023-07-16: qty 2

## 2023-07-16 MED ORDER — DIPHENHYDRAMINE HCL 25 MG PO CAPS
25.0000 mg | ORAL_CAPSULE | Freq: Once | ORAL | Status: AC
  Administered 2023-07-16: 25 mg via ORAL
  Filled 2023-07-16: qty 1

## 2023-07-16 NOTE — Patient Instructions (Signed)
Iron Sucrose Injection What is this medication? IRON SUCROSE (EYE ern SOO krose) treats low levels of iron (iron deficiency anemia) in people with kidney disease. Iron is a mineral that plays an important role in making red blood cells, which carry oxygen from your lungs to the rest of your body. This medicine may be used for other purposes; ask your health care provider or pharmacist if you have questions. COMMON BRAND NAME(S): Venofer What should I tell my care team before I take this medication? They need to know if you have any of these conditions: Anemia not caused by low iron levels Heart disease High levels of iron in the blood Kidney disease Liver disease An unusual or allergic reaction to iron, other medications, foods, dyes, or preservatives Pregnant or trying to get pregnant Breastfeeding How should I use this medication? This medication is for infusion into a vein. It is given in a hospital or clinic setting. Talk to your care team about the use of this medication in children. While this medication may be prescribed for children as young as 2 years for selected conditions, precautions do apply. Overdosage: If you think you have taken too much of this medicine contact a poison control center or emergency room at once. NOTE: This medicine is only for you. Do not share this medicine with others. What if I miss a dose? Keep appointments for follow-up doses. It is important not to miss your dose. Call your care team if you are unable to keep an appointment. What may interact with this medication? Do not take this medication with any of the following: Deferoxamine Dimercaprol Other iron products This medication may also interact with the following: Chloramphenicol Deferasirox This list may not describe all possible interactions. Give your health care provider a list of all the medicines, herbs, non-prescription drugs, or dietary supplements you use. Also tell them if you smoke,  drink alcohol, or use illegal drugs. Some items may interact with your medicine. What should I watch for while using this medication? Visit your care team regularly. Tell your care team if your symptoms do not start to get better or if they get worse. You may need blood work done while you are taking this medication. You may need to follow a special diet. Talk to your care team. Foods that contain iron include: whole grains/cereals, dried fruits, beans, or peas, leafy green vegetables, and organ meats (liver, kidney). What side effects may I notice from receiving this medication? Side effects that you should report to your care team as soon as possible: Allergic reactions--skin rash, itching, hives, swelling of the face, lips, tongue, or throat Low blood pressure--dizziness, feeling faint or lightheaded, blurry vision Shortness of breath Side effects that usually do not require medical attention (report to your care team if they continue or are bothersome): Flushing Headache Joint pain Muscle pain Nausea Pain, redness, or irritation at injection site This list may not describe all possible side effects. Call your doctor for medical advice about side effects. You may report side effects to FDA at 1-800-FDA-1088. Where should I keep my medication? This medication is given in a hospital or clinic. It will not be stored at home. NOTE: This sheet is a summary. It may not cover all possible information. If you have questions about this medicine, talk to your doctor, pharmacist, or health care provider.  2024 Elsevier/Gold Standard (2023-05-01 00:00:00)

## 2023-07-16 NOTE — Progress Notes (Signed)
Diagnosis: Iron Deficiency Anemia  Provider:  Chilton Greathouse MD  Procedure: IV Infusion  IV Type: Peripheral, IV Location: L Antecubital  Venofer (Iron Sucrose), Dose: 300 mg  Infusion Start Time: 1302  Infusion Stop Time: 1444  Post Infusion IV Care: Patient declined observation and Peripheral IV Discontinued  Discharge: Condition: Good, Destination: Home . AVS Declined  Performed by:  Adriana Mccallum, RN

## 2023-07-21 ENCOUNTER — Other Ambulatory Visit: Payer: Self-pay | Admitting: Family

## 2023-07-21 DIAGNOSIS — Z1231 Encounter for screening mammogram for malignant neoplasm of breast: Secondary | ICD-10-CM

## 2023-07-22 ENCOUNTER — Encounter: Payer: Self-pay | Admitting: Internal Medicine

## 2023-07-23 ENCOUNTER — Ambulatory Visit (INDEPENDENT_AMBULATORY_CARE_PROVIDER_SITE_OTHER): Payer: No Typology Code available for payment source

## 2023-07-23 VITALS — BP 142/87 | HR 69 | Temp 98.8°F | Resp 16 | Ht 62.0 in | Wt 198.8 lb

## 2023-07-23 DIAGNOSIS — D500 Iron deficiency anemia secondary to blood loss (chronic): Secondary | ICD-10-CM | POA: Diagnosis not present

## 2023-07-23 DIAGNOSIS — N920 Excessive and frequent menstruation with regular cycle: Secondary | ICD-10-CM | POA: Diagnosis not present

## 2023-07-23 MED ORDER — DIPHENHYDRAMINE HCL 25 MG PO CAPS: 25.0000 mg | ORAL_CAPSULE | Freq: Once | ORAL | Status: DC

## 2023-07-23 MED ORDER — SODIUM CHLORIDE 0.9 % IV SOLN
300.0000 mg | INTRAVENOUS | Status: DC
  Administered 2023-07-23: 300 mg via INTRAVENOUS
  Filled 2023-07-23: qty 15

## 2023-07-23 MED ORDER — ACETAMINOPHEN 325 MG PO TABS: 650.0000 mg | ORAL_TABLET | Freq: Once | ORAL | Status: DC

## 2023-07-23 NOTE — Progress Notes (Signed)
Diagnosis: Iron Deficiency Anemia  Provider:  Chilton Greathouse MD  Procedure: IV Infusion  IV Type: Peripheral, IV Location: R Antecubital  Venofer (Iron Sucrose), Dose: 300 mg  Infusion Start Time: 0945  Infusion Stop Time: 1127  Post Infusion IV Care: Patient declined observation and Peripheral IV Discontinued  Discharge: Condition: Good, Destination: Home . AVS Declined  Performed by:  Adriana Mccallum, RN

## 2023-07-28 ENCOUNTER — Other Ambulatory Visit: Payer: Self-pay | Admitting: Family

## 2023-07-28 DIAGNOSIS — Z1231 Encounter for screening mammogram for malignant neoplasm of breast: Secondary | ICD-10-CM

## 2023-07-30 ENCOUNTER — Ambulatory Visit: Payer: Medicaid Other

## 2023-07-30 ENCOUNTER — Ambulatory Visit (INDEPENDENT_AMBULATORY_CARE_PROVIDER_SITE_OTHER): Payer: 59 | Admitting: Family Medicine

## 2023-07-30 DIAGNOSIS — Z1231 Encounter for screening mammogram for malignant neoplasm of breast: Secondary | ICD-10-CM

## 2023-07-31 ENCOUNTER — Ambulatory Visit (INDEPENDENT_AMBULATORY_CARE_PROVIDER_SITE_OTHER): Payer: No Typology Code available for payment source

## 2023-07-31 VITALS — BP 155/111 | HR 69 | Temp 97.9°F | Resp 16 | Ht 62.0 in | Wt 199.4 lb

## 2023-07-31 DIAGNOSIS — D500 Iron deficiency anemia secondary to blood loss (chronic): Secondary | ICD-10-CM | POA: Diagnosis not present

## 2023-07-31 DIAGNOSIS — N920 Excessive and frequent menstruation with regular cycle: Secondary | ICD-10-CM

## 2023-07-31 MED ORDER — DIPHENHYDRAMINE HCL 25 MG PO CAPS: 25.0000 mg | ORAL_CAPSULE | Freq: Once | ORAL | Status: DC

## 2023-07-31 MED ORDER — ACETAMINOPHEN 325 MG PO TABS: 650.0000 mg | ORAL_TABLET | Freq: Once | ORAL | Status: DC

## 2023-07-31 MED ORDER — SODIUM CHLORIDE 0.9 % IV SOLN
300.0000 mg | INTRAVENOUS | Status: DC
  Administered 2023-07-31: 300 mg via INTRAVENOUS
  Filled 2023-07-31: qty 15

## 2023-07-31 NOTE — Progress Notes (Signed)
Diagnosis: Iron Deficiency Anemia  Provider:  Chilton Greathouse MD  Procedure: IV Infusion  IV Type: Peripheral, IV Location: L Antecubital  Venofer (Iron Sucrose), Dose: 300 mg  Infusion Start Time: 1320  Infusion Stop Time: 1458  Post Infusion IV Care: Patient declined observation and Peripheral IV Discontinued  Discharge: Condition: Good, Destination: Home . AVS Declined  Performed by:  Rico Ala, LPN

## 2023-08-06 ENCOUNTER — Ambulatory Visit
Admission: RE | Admit: 2023-08-06 | Discharge: 2023-08-06 | Disposition: A | Discharge status: Home / Self Care | Payer: No Typology Code available for payment source | Source: Ambulatory Visit

## 2023-08-06 DIAGNOSIS — Z1231 Encounter for screening mammogram for malignant neoplasm of breast: Secondary | ICD-10-CM

## 2023-08-15 ENCOUNTER — Telehealth: Payer: No Typology Code available for payment source | Admitting: Family

## 2023-08-15 DIAGNOSIS — U071 COVID-19: Secondary | ICD-10-CM | POA: Diagnosis not present

## 2023-08-15 MED ORDER — BENZONATATE 100 MG PO CAPS
100.0000 mg | ORAL_CAPSULE | Freq: Three times a day (TID) | ORAL | 0 refills | Status: DC | PRN
Start: 2023-08-15 — End: 2023-12-17

## 2023-08-15 MED ORDER — NIRMATRELVIR/RITONAVIR (PAXLOVID)TABLET: 3.0000 | ORAL_TABLET | Freq: Two times a day (BID) | ORAL | 0 refills | Status: AC

## 2023-08-15 NOTE — Progress Notes (Signed)
Virtual Visit Consent   Patricia Lin, you are scheduled for a virtual visit with a [facility] provider today. Just as with appointments in the office, your consent must be obtained to participate. Your consent will be active for this visit and any virtual visit you may have with one of our providers in the next 365 days. If you have a MyChart account, a copy of this consent can be sent to you electronically.  As this is a virtual visit, video technology does not allow for your provider to perform a traditional examination. This may limit your provider's ability to fully assess your condition. If your provider identifies any concerns that need to be evaluated in person or the need to arrange testing (such as labs, EKG, etc.), we will make arrangements to do so. Although advances in technology are sophisticated, we cannot ensure that it will always work on either your end or our end. If the connection with a video visit is poor, the visit may have to be switched to a telephone visit. With either a video or telephone visit, we are not always able to ensure that we have a secure connection.  By engaging in this virtual visit, you consent to the provision of healthcare and authorize for your insurance to be billed (if applicable) for the services provided during this visit. Depending on your insurance coverage, you may receive a charge related to this service.  I need to obtain your verbal consent now. Are you willing to proceed with your visit today? Patricia Lin has provided verbal consent on 08/15/2023 for a virtual visit (video or telephone). Jannifer Rodney, FNP  Date: 08/15/2023 8:59 AM  Virtual Visit via Video Note   I, Jannifer Rodney, connected with  Patricia Lin  ([id number], 04-20-1981) on 08/15/23 at  9:00 AM EDT by a video-enabled telemedicine application and verified that I am speaking with the correct person using two identifiers.  Location: Patient: Virtual Visit Location Patient:  Home Provider: Virtual Visit Location Provider: Home Office   I discussed the limitations of evaluation and management by telemedicine and the availability of in person appointments. The patient expressed understanding and agreed to proceed.    History of Present Illness: Patricia Lin is a 42 y.o. who identifies as a female who was assigned female at birth, and is being seen today for COVID. She reports her symptoms started 4 days ago and has worsen. She tested positive yesterday.   HPI: URI  This is a new problem. The current episode started in the past 7 days. The problem has been gradually worsening. There has been no fever. Associated symptoms include congestion, coughing, ear pain, headaches, joint pain, rhinorrhea, sinus pain, sneezing and a sore throat. Pertinent negatives include no wheezing. She has tried acetaminophen, sleep and increased fluids for the symptoms. The treatment provided mild relief.    Problems:  Patient Active Problem List   Diagnosis Date Noted   Other insomnia 07/01/2023   BMI 35.0-35.9,adult 01/22/2023   Obesity, Beginning BMI 34.93 01/22/2023   Binge-Eating Disorder, Mild 09/29/2022   Prediabetes 08/12/2022   Depression 08/12/2022   Class 1 obesity with serious comorbidity and body mass index (BMI) of 34.0 to 34.9 in adult 08/12/2022   Iron deficiency anemia 07/29/2022   Insulin resistance 06/28/2022   Essential hypertension 06/28/2022   Iron deficiency 06/28/2022   Vitamin D deficiency 06/28/2022   Other hyperlipidemia 06/28/2022   At risk of diabetes mellitus 06/28/2022   Iron deficiency  anemia due to chronic blood loss 05/07/2022   Right lower quadrant abdominal pain 01/28/2019   Generalized headaches 12/17/2018   Anxiety 12/17/2018   History of anemia 05/26/2018   Anemia 12/05/2017   Routine health maintenance 03/03/2013   S/P tubal ligation 02/09/2013   Vaginal delivery 01/09/2013   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   Fracture of left ulna 09/27/12  10/23/2012   Cervical funneling 09/18/2012   Cervical shortening 09/18/2012   Symptomatic anemia 09/03/2012   H/O pre-eclampsia in prior pregnancy, currently pregnant 08/19/2012   Chronic hypertension in pregnancy 08/19/2012   Hx LEEP (loop electrosurgical excision procedure), cervix, pregnancy 08/19/2012   Hx of postpartum depression, currently pregnant 08/19/2012   Late prenatal care 08/19/2012   Hx of precipitous labor and deliveries, antepartum 08/19/2012   Muscle tension HAs 08/19/2012    Allergies:  Allergies  Allergen Reactions   Latex Itching and Rash   Medications:  Current Outpatient Medications:    benzonatate (TESSALON PERLES) 100 MG capsule, Take 1 capsule (100 mg total) by mouth 3 (three) times daily as needed., Disp: 20 capsule, Rfl: 0   nirmatrelvir/ritonavir (PAXLOVID) 20 x 150 MG & 10 x 100MG  TABS, Take 3 tablets by mouth 2 (two) times daily for 5 days. (Take nirmatrelvir 150 mg two tablets twice daily for 5 days and ritonavir 100 mg one tablet twice daily for 5 days) Patient GFR is >60, Disp: 30 tablet, Rfl: 0   ferrous sulfate 325 (65 FE) MG tablet, TAKE 1 TABLET BY MOUTH 3 TIMES DAILY WITH MEALS., Disp: 90 tablet, Rfl: 3   Insulin Pen Needle (BD PEN NEEDLE NANO 2ND GEN) 32G X 4 MM MISC, Use 1 needle daily to inject medication., Disp: 100 each, Rfl: 0   metFORMIN (GLUCOPHAGE) 500 MG tablet, Take 1/2 tablet (250 mg total) by mouth 2 (two) times daily, with breakfast and lunch, Disp: 90 tablet, Rfl: 0   Vitamin D, Ergocalciferol, (DRISDOL) 1.25 MG (50000 UNIT) CAPS capsule, Take 1 capsule (50,000 Units total) by mouth every 7 (seven) days., Disp: 4 capsule, Rfl: 0  Current Facility-Administered Medications:    triamcinolone acetonide (KENALOG-40) injection 40 mg, 40 mg, Intramuscular, Once, Rema Fendt, NP  Observations/Objective: Patient is well-developed, well-nourished in no acute distress.  Resting comfortably  at home.  Head is normocephalic, atraumatic.   No labored breathing.  Speech is clear and coherent with logical content.  Patient is alert and oriented at baseline.  Nasal congestion, nonproductive cough  Assessment and Plan: 1. COVID-19 - nirmatrelvir/ritonavir (PAXLOVID) 20 x 150 MG & 10 x 100MG  TABS; Take 3 tablets by mouth 2 (two) times daily for 5 days. (Take nirmatrelvir 150 mg two tablets twice daily for 5 days and ritonavir 100 mg one tablet twice daily for 5 days) Patient GFR is >60  Dispense: 30 tablet; Refill: 0 - benzonatate (TESSALON PERLES) 100 MG capsule; Take 1 capsule (100 mg total) by mouth 3 (three) times daily as needed.  Dispense: 20 capsule; Refill: 0  COVID positive, rest, force fluids, tylenol as needed, Quarantine for at least 5 days and you are fever free, then must wear a mask out in public from day 6-10, report any worsening symptoms such as increased shortness of breath, swelling, or continued high fevers. Possible adverse effects discussed with antivirals.    Follow Up Instructions: I discussed the assessment and treatment plan with the patient. The patient was provided an opportunity to ask questions and all were answered. The patient agreed with the plan and  demonstrated an understanding of the instructions.  A copy of instructions were sent to the patient via MyChart unless otherwise noted below.     The patient was advised to call back or seek an in-person evaluation if the symptoms worsen or if the condition fails to improve as anticipated.  Time:  I spent 6 minutes with the patient via telehealth technology discussing the above problems/concerns.    Jannifer Rodney, FNP

## 2023-09-03 ENCOUNTER — Ambulatory Visit (INDEPENDENT_AMBULATORY_CARE_PROVIDER_SITE_OTHER): Payer: No Typology Code available for payment source | Admitting: Family Medicine

## 2023-09-03 ENCOUNTER — Encounter (INDEPENDENT_AMBULATORY_CARE_PROVIDER_SITE_OTHER): Payer: Self-pay | Admitting: Family Medicine

## 2023-09-03 VITALS — BP 135/95 | HR 70 | Temp 98.3°F | Ht 62.0 in | Wt 199.0 lb

## 2023-09-03 DIAGNOSIS — E559 Vitamin D deficiency, unspecified: Secondary | ICD-10-CM

## 2023-09-03 DIAGNOSIS — Z6836 Body mass index (BMI) 36.0-36.9, adult: Secondary | ICD-10-CM | POA: Diagnosis not present

## 2023-09-03 DIAGNOSIS — R7303 Prediabetes: Secondary | ICD-10-CM

## 2023-09-03 DIAGNOSIS — E669 Obesity, unspecified: Secondary | ICD-10-CM | POA: Diagnosis not present

## 2023-09-03 MED ORDER — METFORMIN HCL 500 MG PO TABS
250.0000 mg | ORAL_TABLET | Freq: Two times a day (BID) | ORAL | 0 refills | Status: DC
Start: 2023-09-03 — End: 2024-02-15

## 2023-09-03 MED ORDER — VITAMIN D (ERGOCALCIFEROL) 1.25 MG (50000 UNIT) PO CAPS
50000.0000 [IU] | ORAL_CAPSULE | ORAL | 0 refills | Status: DC
Start: 2023-09-03 — End: 2023-12-17

## 2023-09-03 NOTE — Progress Notes (Signed)
Chief Complaint:   OBESITY Patricia Lin is here to discuss her progress with her obesity treatment plan along with follow-up of her obesity related diagnoses. Patricia Lin is on the Category 1 Plan and states she is following her eating plan approximately 0% of the time. Patricia Lin states she is doing 0 minutes 0 times per week.  Today's visit was #: 19 Starting weight: 191 lbs Starting date: 09/08/2018 Today's weight: 199 lbs Today's date: 09/03/2023 Total lbs lost to date: 0 Total lbs lost since last in-office visit: 0  Interim History: Patient is not doing well with following a meal plan.  She notes her family (children) sabotage with meals, and decreased meal planning and prepping.  Subjective:   1. Prediabetes Patient is on metformin, but she has not been following her meal plan well recently.  She is working on getting back on track.  2. Vitamin D deficiency Patient is on vitamin D with no side effects noted.  She needs a refill today.  Assessment/Plan:   1. Prediabetes Patient will continue metformin 500 mg 1/2 tablet twice daily, and we will refill for 90 days.  - metFORMIN (GLUCOPHAGE) 500 MG tablet; Take 1/2 tablet (250 mg total) by mouth 2 (two) times daily, with breakfast and lunch  Dispense: 90 tablet; Refill: 0  2. Vitamin D deficiency Patient will continue prescription vitamin D 50,000 IU once weekly, and we will refill for 1 month.  - Vitamin D, Ergocalciferol, (DRISDOL) 1.25 MG (50000 UNIT) CAPS capsule; Take 1 capsule (50,000 Units total) by mouth every 7 (seven) days.  Dispense: 4 capsule; Refill: 0  3. BMI 36.0-36.9,adult  4. Obesity, Beginning BMI 34.93 Patricia Lin is currently in the action stage of change. As such, her goal is to continue with weight loss efforts. She has agreed to the Category 1 Plan and keeping a food journal and adhering to recommended goals of 350-500 calories and 35 grams of protein daily.   Patient was shown how to use Chat GPT to help with meal  planning.  I emphasized the need to follow her Category 1 plan to lose weight.  Behavioral modification strategies: increasing lean protein intake, meal planning and cooking strategies, and dealing with family or coworker sabotage.  Patricia Lin has agreed to follow-up with our clinic in 4 weeks. She was informed of the importance of frequent follow-up visits to maximize her success with intensive lifestyle modifications for her multiple health conditions.   Objective:   Blood pressure (!) 135/95, pulse 70, temperature 98.3 F (36.8 C), height 5\' 2"  (1.575 m), weight 199 lb (90.3 kg), last menstrual period 07/22/2023, SpO2 99%. Body mass index is 36.4 kg/m.  Lab Results  Component Value Date   CREATININE 0.86 07/07/2023   BUN 18 07/07/2023   NA 139 07/07/2023   K 4.0 07/07/2023   CL 108 07/07/2023   CO2 25 07/07/2023   Lab Results  Component Value Date   ALT 17 07/07/2023   AST 16 07/07/2023   ALKPHOS 72 07/07/2023   BILITOT 0.5 07/07/2023   Lab Results  Component Value Date   HGBA1C 5.6 05/08/2023   HGBA1C 5.2 11/11/2022   HGBA1C 5.2 10/01/2022   HGBA1C 5.5 05/02/2022   HGBA1C 5.2 09/08/2018   Lab Results  Component Value Date   INSULIN 11.7 11/11/2022   INSULIN 15.1 06/11/2022   INSULIN 11.5 09/08/2018   Lab Results  Component Value Date   TSH 1.280 05/08/2023   Lab Results  Component Value Date  CHOL 192 05/08/2023   HDL 80 05/08/2023   LDLCALC 98 05/08/2023   TRIG 79 05/08/2023   CHOLHDL 2.4 05/08/2023   Lab Results  Component Value Date   VD25OH 23.2 (L) 11/11/2022   VD25OH 22.2 (L) 06/11/2022   VD25OH 9.6 (L) 09/08/2018   Lab Results  Component Value Date   WBC 3.9 (L) 07/07/2023   HGB 7.9 (L) 07/07/2023   HCT 27.0 (L) 07/07/2023   MCV 68.4 (L) 07/07/2023   PLT 383 07/07/2023   Lab Results  Component Value Date   IRON 73 01/06/2023   TIBC 319 01/06/2023   FERRITIN 2 (L) 07/07/2023   Attestation Statements:   Reviewed by clinician on day of  visit: allergies, medications, problem list, medical history, surgical history, family history, social history, and previous encounter notes.   I, Burt Knack, am acting as transcriptionist for Quillian Quince, MD.  I have reviewed the above documentation for accuracy and completeness, and I agree with the above. -  Quillian Quince, MD

## 2023-10-01 ENCOUNTER — Inpatient Hospital Stay: Payer: No Typology Code available for payment source | Attending: Internal Medicine

## 2023-10-01 ENCOUNTER — Ambulatory Visit (INDEPENDENT_AMBULATORY_CARE_PROVIDER_SITE_OTHER): Payer: No Typology Code available for payment source | Admitting: Family Medicine

## 2023-10-01 ENCOUNTER — Inpatient Hospital Stay: Payer: No Typology Code available for payment source | Admitting: Internal Medicine

## 2023-10-17 ENCOUNTER — Telehealth: Payer: Self-pay | Admitting: Internal Medicine

## 2023-10-17 NOTE — Telephone Encounter (Signed)
Returned patient's phone call regarding rescheduling missed appointment, left a voicemail to reschedule with patient.

## 2023-11-25 ENCOUNTER — Encounter (INDEPENDENT_AMBULATORY_CARE_PROVIDER_SITE_OTHER): Payer: Self-pay | Admitting: Family Medicine

## 2023-11-25 ENCOUNTER — Ambulatory Visit (INDEPENDENT_AMBULATORY_CARE_PROVIDER_SITE_OTHER): Payer: No Typology Code available for payment source | Admitting: Family Medicine

## 2023-11-25 VITALS — BP 137/90 | HR 75 | Temp 98.0°F | Ht 62.0 in | Wt 205.0 lb

## 2023-11-25 DIAGNOSIS — E559 Vitamin D deficiency, unspecified: Secondary | ICD-10-CM | POA: Diagnosis not present

## 2023-11-25 DIAGNOSIS — D509 Iron deficiency anemia, unspecified: Secondary | ICD-10-CM

## 2023-11-25 DIAGNOSIS — I10 Essential (primary) hypertension: Secondary | ICD-10-CM

## 2023-11-25 DIAGNOSIS — E669 Obesity, unspecified: Secondary | ICD-10-CM

## 2023-11-25 DIAGNOSIS — R7303 Prediabetes: Secondary | ICD-10-CM | POA: Diagnosis not present

## 2023-11-25 DIAGNOSIS — Z6837 Body mass index (BMI) 37.0-37.9, adult: Secondary | ICD-10-CM

## 2023-11-25 NOTE — Progress Notes (Signed)
.smr  Office: [phone]  /  Fax: [phone]  WEIGHT SUMMARY AND BIOMETRICS  Anthropometric Measurements Height: 5\' 2"  (1.575 m) Weight: 205 lb (93 kg) BMI (Calculated): 37.49 Weight at Last Visit: 199 lb Weight Lost Since Last Visit: 0 Weight Gained Since Last Visit: 0 Total Weight Loss (lbs): 0 lb (0 kg)   Body Composition  Body Fat %: 42.3 % Fat Mass (lbs): 87 lbs Muscle Mass (lbs): 112.8 lbs Total Body Water (lbs): 77.2 lbs Visceral Fat Rating : 11   Other Clinical Data Fasting: No Labs: Yes Today's Visit #: 20    Chief Complaint: OBESITY    History of Present Illness   The patient, with a history of obesity, prediabetes, vitamin D deficiency, and newly diagnosed hypertension, presents with concerns about potential diabetes. She reports a month-Bir history of tingling in her feet, which is worse at night. She also describes episodes of dizziness, particularly after walking fast and then sitting down. She attributes these symptoms to possible blood sugar fluctuations and admits to not eating regularly due to a lack of appetite.  The patient has been prescribed metformin for prediabetes and vitamin D supplements for deficiency, but adherence to these medications has been inconsistent due to work commitments and gastrointestinal side effects. She has gained six pounds in the past six months and does not follow a structured eating plan, although she does engage in walking for exercise twice a week.  The patient also reports a history of iron deficiency anemia and is interested in exploring options for weight loss medication, specifically Ozempic, pending lab results. She plans to undertake a 30-day fast in January, which includes a week of liquids, followed by the incorporation of fruits, vegetables, and seafood.  The patient denies any current health issues other than the aforementioned symptoms and conditions. She has not been on any new medications and has not  reported any changes in her health since her last visit three months ago.          PHYSICAL EXAM:  Blood pressure (!) 137/90, pulse 75, temperature 98 F (36.7 C), height 5\' 2"  (1.575 m), weight 205 lb (93 kg), SpO2 100%. Body mass index is 37.49 kg/m.  DIAGNOSTIC DATA REVIEWED:  BMET    Component Value Date/Time   NA 139 07/07/2023 1458   NA 140 05/08/2023 0000   K 4.0 07/07/2023 1458   CL 108 07/07/2023 1458   CO2 25 07/07/2023 1458   GLUCOSE 92 07/07/2023 1458   BUN 18 07/07/2023 1458   BUN 14 05/08/2023 0000   CREATININE 0.86 07/07/2023 1458   CREATININE 0.90 05/07/2022 1403   CREATININE 0.64 01/17/2013 1720   CALCIUM 9.6 07/07/2023 1458   GFRNONAA >60 07/07/2023 1458   GFRNONAA >60 05/07/2022 1403   GFRAA >60 09/04/2019 0130   Lab Results  Component Value Date   HGBA1C 5.6 05/08/2023   HGBA1C 5.2 09/08/2018   Lab Results  Component Value Date   INSULIN 11.7 11/11/2022   INSULIN 11.5 09/08/2018   Lab Results  Component Value Date   TSH 1.280 05/08/2023   CBC    Component Value Date/Time   WBC 3.9 (L) 07/07/2023 1458   RBC 3.95 07/07/2023 1458   HGB 7.9 (L) 07/07/2023 1458   HGB 12.1 01/06/2023 1510   HGB 12.2 07/29/2022 1445   HCT 27.0 (L) 07/07/2023 1458   HCT 39.0 07/29/2022 1445   PLT 383 07/07/2023 1458   PLT 296 01/06/2023 1510   PLT 218 07/29/2022 1445  MCV 68.4 (L) 07/07/2023 1458   MCV 84 07/29/2022 1445   MCH 20.0 (L) 07/07/2023 1458   MCHC 29.3 (L) 07/07/2023 1458   RDW 17.8 (H) 07/07/2023 1458   RDW 17.2 (H) 07/29/2022 1445   Iron Studies    Component Value Date/Time   IRON 73 01/06/2023 1510   IRON 82 11/11/2022 1017   TIBC 319 01/06/2023 1510   TIBC 310 11/11/2022 1017   FERRITIN 2 (L) 07/07/2023 1459   FERRITIN 17 09/08/2019 1512   IRONPCTSAT 23 01/06/2023 1510   IRONPCTSAT 26 11/11/2022 1017   Lipid Panel     Component Value Date/Time   CHOL 192 05/08/2023 0000   TRIG 79 05/08/2023 0000   HDL 80 05/08/2023 0000    CHOLHDL 2.4 05/08/2023 0000   LDLCALC 98 05/08/2023 0000   Hepatic Function Panel     Component Value Date/Time   PROT 7.6 07/07/2023 1458   PROT 7.2 05/08/2023 0000   ALBUMIN 4.4 07/07/2023 1458   ALBUMIN 4.2 05/08/2023 0000   AST 16 07/07/2023 1458   AST 24 05/07/2022 1403   ALT 17 07/07/2023 1458   ALT 27 05/07/2022 1403   ALKPHOS 72 07/07/2023 1458   BILITOT 0.5 07/07/2023 1458   BILITOT 0.5 05/08/2023 0000   BILITOT 0.6 05/07/2022 1403      Component Value Date/Time   TSH 1.280 05/08/2023 0000   Nutritional Lab Results  Component Value Date   VD25OH 23.2 (L) 11/11/2022   VD25OH 22.2 (L) 06/11/2022   VD25OH 9.6 (L) 09/08/2018     Assessment and Plan    Hypertension Blood pressure readings have been above goal for more than half of the last six months, meeting the criteria for hypertension. Today's readings were 156/88 and 137/90. Not currently on antihypertensive medications. - Monitor blood pressure regularly - Discuss potential initiation of antihypertensive medication at next visit  Prediabetes Reports tingling in feet for about a month, worse at night, and occasional dizziness when walking fast, indicating possible blood sugar fluctuations. Not consistently taking metformin due to gastrointestinal side effects. Fasting blood work will be done to assess blood glucose levels. - Order fasting blood work to assess blood glucose levels - Encourage resumption of metformin when working from home - Discuss dietary modifications to stabilize blood sugar levels - Discuss potential diabetes medications such as Ozempic pending lab results  Obesity Gained six pounds in the last six months and has not been following a structured eating plan. Walks for 45 minutes twice a week. She is not currently  in the action stage of change Will schedule a follow-up appointment in January to discuss weight loss strategies and medication options. - Schedule follow-up appointment in January  to discuss weight loss strategies and medication options and asses if she is in the action stage of change - Encourage continuation of physical activity - Discuss dietary modifications and structured eating plan after the holidays  Vitamin D Deficiency Has been on a prescription of 50,000 IU of vitamin D weekly but has run out of medication. Completed last dose last Wednesday. Vitamin D levels will be checked to determine if a refill is needed or if switching to over-the-counter vitamin D is appropriate. - Order vitamin D level - Refill vitamin D prescription if levels are still low - Consider switching to over-the-counter vitamin D if levels are adequate  Iron Deficiency Anemia Iron deficiency anemia managed with supplements. Requested additional lab tests to check iron levels. - Order iron studies  General  Health Maintenance Requested additional lab tests to check iron levels and other vitamin levels. Will check B6, magnesium, and phosphorus levels. - Order B6, magnesium, and phosphorus levels - Schedule follow-up appointment in 3-4 weeks to review lab results and discuss next steps  Follow-up - Schedule follow-up appointment in January to review lab results and discuss dietary plan post-fasting.     I have personally spent 45 minutes total time today in preparation, patient care, and documentation for this visit, including the following: review of clinical lab tests; review of medical tests/procedures/services.    She was informed of the importance of frequent follow up visits to maximize her success with intensive lifestyle modifications for her multiple health conditions.    Quillian Quince, MD

## 2023-11-26 LAB — LIPID PANEL WITH LDL/HDL RATIO
Cholesterol, Total: 207 mg/dL — ABNORMAL HIGH (ref 100–199)
HDL: 78 mg/dL (ref >39)
LDL Chol Calc (NIH): 114 mg/dL — ABNORMAL HIGH (ref 0–99)
LDL/HDL Ratio: 1.5 {ratio} (ref 0.0–3.2)
Triglycerides: 84 mg/dL (ref 0–149)
VLDL Cholesterol Cal: 15 mg/dL (ref 5–40)

## 2023-11-26 LAB — CMP14+EGFR
ALT: 25 [IU]/L (ref 0–32)
AST: 21 [IU]/L (ref 0–40)
Albumin: 4.4 g/dL (ref 3.9–4.9)
Alkaline Phosphatase: 95 [IU]/L (ref 44–121)
BUN/Creatinine Ratio: 19 (ref 9–23)
BUN: 15 mg/dL (ref 6–24)
Bilirubin Total: 0.7 mg/dL (ref 0.0–1.2)
CO2: 23 mmol/L (ref 20–29)
Calcium: 9.4 mg/dL (ref 8.7–10.2)
Chloride: 106 mmol/L (ref 96–106)
Creatinine, Ser: 0.77 mg/dL (ref 0.57–1.00)
Globulin, Total: 2.6 g/dL (ref 1.5–4.5)
Glucose: 101 mg/dL — ABNORMAL HIGH (ref 70–99)
Potassium: 3.9 mmol/L (ref 3.5–5.2)
Sodium: 141 mmol/L (ref 134–144)
Total Protein: 7 g/dL (ref 6.0–8.5)
eGFR: 98 mL/min/{1.73_m2} (ref >59)

## 2023-11-26 LAB — CBC WITH DIFFERENTIAL/PLATELET
Basophils Absolute: 0 10*3/uL (ref 0.0–0.2)
Basos: 1 %
EOS (ABSOLUTE): 0.1 10*3/uL (ref 0.0–0.4)
Eos: 1 %
Hematocrit: 34.9 % (ref 34.0–46.6)
Hemoglobin: 10.4 g/dL — ABNORMAL LOW (ref 11.1–15.9)
Immature Grans (Abs): 0 10*3/uL (ref 0.0–0.1)
Immature Granulocytes: 0 %
Lymphocytes Absolute: 1.4 10*3/uL (ref 0.7–3.1)
Lymphs: 34 %
MCH: 24.8 pg — ABNORMAL LOW (ref 26.6–33.0)
MCHC: 29.8 g/dL — ABNORMAL LOW (ref 31.5–35.7)
MCV: 83 fL (ref 79–97)
Monocytes Absolute: 0.3 10*3/uL (ref 0.1–0.9)
Monocytes: 8 %
Neutrophils Absolute: 2.3 10*3/uL (ref 1.4–7.0)
Neutrophils: 56 %
Platelets: 330 10*3/uL (ref 150–450)
RBC: 4.19 x10E6/uL (ref 3.77–5.28)
RDW: 14.4 % (ref 11.7–15.4)
WBC: 4.2 10*3/uL (ref 3.4–10.8)

## 2023-11-26 LAB — INSULIN, RANDOM: INSULIN: 16.3 u[IU]/mL (ref 2.6–24.9)

## 2023-11-26 LAB — IRON AND TIBC
Iron Saturation: 9 % — CL (ref 15–55)
Iron: 32 ug/dL (ref 27–159)
Total Iron Binding Capacity: 367 ug/dL (ref 250–450)
UIBC: 335 ug/dL (ref 131–425)

## 2023-11-26 LAB — HEMOGLOBIN A1C
Est. average glucose Bld gHb Est-mCnc: 111 mg/dL
Hgb A1c MFr Bld: 5.5 % (ref 4.8–5.6)

## 2023-11-26 LAB — VITAMIN B12: Vitamin B-12: 754 pg/mL (ref 232–1245)

## 2023-11-26 LAB — PHOSPHORUS: Phosphorus: 4 mg/dL (ref 3.0–4.3)

## 2023-11-26 LAB — VITAMIN D 25 HYDROXY (VIT D DEFICIENCY, FRACTURES): Vit D, 25-Hydroxy: 30.3 ng/mL (ref 30.0–100.0)

## 2023-11-26 LAB — MAGNESIUM: Magnesium: 1.9 mg/dL (ref 1.6–2.3)

## 2023-11-26 LAB — FERRITIN: Ferritin: 12 ng/mL — ABNORMAL LOW (ref 15–150)

## 2023-11-26 LAB — FOLATE: Folate: 10.5 ng/mL (ref >3.0)

## 2023-12-07 ENCOUNTER — Other Ambulatory Visit (INDEPENDENT_AMBULATORY_CARE_PROVIDER_SITE_OTHER): Payer: Self-pay | Admitting: Family Medicine

## 2023-12-07 DIAGNOSIS — R7303 Prediabetes: Secondary | ICD-10-CM

## 2023-12-17 ENCOUNTER — Encounter (INDEPENDENT_AMBULATORY_CARE_PROVIDER_SITE_OTHER): Payer: Self-pay | Admitting: Family Medicine

## 2023-12-17 ENCOUNTER — Ambulatory Visit (INDEPENDENT_AMBULATORY_CARE_PROVIDER_SITE_OTHER): Payer: No Typology Code available for payment source | Admitting: Family Medicine

## 2023-12-17 VITALS — BP 148/92 | HR 77 | Temp 98.1°F | Ht 62.0 in | Wt 203.0 lb

## 2023-12-17 DIAGNOSIS — Z6837 Body mass index (BMI) 37.0-37.9, adult: Secondary | ICD-10-CM

## 2023-12-17 DIAGNOSIS — D509 Iron deficiency anemia, unspecified: Secondary | ICD-10-CM

## 2023-12-17 DIAGNOSIS — R7303 Prediabetes: Secondary | ICD-10-CM

## 2023-12-17 DIAGNOSIS — D500 Iron deficiency anemia secondary to blood loss (chronic): Secondary | ICD-10-CM

## 2023-12-17 DIAGNOSIS — E559 Vitamin D deficiency, unspecified: Secondary | ICD-10-CM

## 2023-12-17 DIAGNOSIS — R030 Elevated blood-pressure reading, without diagnosis of hypertension: Secondary | ICD-10-CM

## 2023-12-17 DIAGNOSIS — E669 Obesity, unspecified: Secondary | ICD-10-CM

## 2023-12-17 MED ORDER — WEGOVY 0.25 MG/0.5ML ~~LOC~~ SOAJ
0.2500 mg | SUBCUTANEOUS | 0 refills | Status: DC
  Filled 2023-12-17 – 2024-01-11 (×2): qty 2, 28d supply, fill #0

## 2023-12-17 MED ORDER — VITAMIN D (ERGOCALCIFEROL) 1.25 MG (50000 UNIT) PO CAPS: 50000.0000 [IU] | ORAL_CAPSULE | ORAL | 0 refills | Status: DC

## 2023-12-17 NOTE — Progress Notes (Signed)
 .smr  Office: [phone]  /  Fax: [phone]  WEIGHT SUMMARY AND BIOMETRICS  Anthropometric Measurements Height: 5' 2 (1.575 m) Weight: 203 lb (92.1 kg) BMI (Calculated): 37.12 Weight at Last Visit: 205 lb Weight Lost Since Last Visit: 3 lb Weight Gained Since Last Visit: 0 Starting Weight: 191 lb Total Weight Loss (lbs): 0 lb (0 kg)   Body Composition  Body Fat %: 41.7 % Fat Mass (lbs): 84.2 lbs Muscle Mass (lbs): 111.8 lbs Total Body Water (lbs): 74.8 lbs Visceral Fat Rating : 11   Other Clinical Data Fasting: No Labs: No Today's Visit #: 21 Starting Date: 09/08/18    Chief Complaint: OBESITY  History of Present Illness   The patient, with a history of obesity, vitamin D  deficiency, and prediabetes, presents for a follow-up consultation. She reports successful weight loss, having lost three pounds recently, even during the holiday season. This is attributed to adherence to a religious fast, the 'Daniel fast,' for the past nine days, and regular exercise, walking for thirty minutes twice a week.  However, the patient's blood pressure readings were elevated during the visit, with readings of 148/92 and 150/96, despite not being on any antihypertensive medications. The patient attributes this to anxiety experienced during clinic visits. She also mentions that her blood pressure at home is dependent on her dietary salt intake.  The patient has been adhering to a liquid diet as part of the 'Daniel fast,' and reports no symptoms of lightheadedness or dizziness. She plans to continue this fast for thirty days, after which she intends to return to a previous eating plan, with possible modifications for less meat intake.  The patient also reports stress related to her new job, which she started in October. She expresses a desire to find a different job due to the stress levels associated with the current one.  Regarding her prediabetes, the patient had been on metformin  but  is currently not taking it due to gastrointestinal side effects. She expresses interest in starting Ozempic , a GLP-1 receptor agonist, for better glycemic control.  The patient also reports heavy menstrual cycles and is currently taking iron  supplements. She has an upcoming appointment with an OB/GYN to discuss possible interventions, including a hysterectomy.  Lastly, the patient is currently on a prescription for vitamin D , taking fifty thousand international units every seven days, to manage her vitamin D  deficiency.          PHYSICAL EXAM:  Blood pressure (!) 148/92, pulse 77, temperature 98.1 F (36.7 C), height 5' 2 (1.575 m), weight 203 lb (92.1 kg), SpO2 100%. Body mass index is 37.13 kg/m.  DIAGNOSTIC DATA REVIEWED:  BMET    Component Value Date/Time   NA 141 11/25/2023 0819   K 3.9 11/25/2023 0819   CL 106 11/25/2023 0819   CO2 23 11/25/2023 0819   GLUCOSE 101 (H) 11/25/2023 0819   GLUCOSE 92 07/07/2023 1458   BUN 15 11/25/2023 0819   CREATININE 0.77 11/25/2023 0819   CREATININE 0.90 05/07/2022 1403   CREATININE 0.64 01/17/2013 1720   CALCIUM 9.4 11/25/2023 0819   GFRNONAA >60 07/07/2023 1458   GFRNONAA >60 05/07/2022 1403   GFRAA >60 09/04/2019 0130   Lab Results  Component Value Date   HGBA1C 5.5 11/25/2023   HGBA1C 5.2 09/08/2018   Lab Results  Component Value Date   INSULIN  16.3 11/25/2023   INSULIN  11.5 09/08/2018   Lab Results  Component Value Date   TSH 1.280 05/08/2023   CBC  Component Value Date/Time   WBC 4.2 11/25/2023 0819   WBC 3.9 (L) 07/07/2023 1458   RBC 4.19 11/25/2023 0819   RBC 3.95 07/07/2023 1458   HGB 10.4 (L) 11/25/2023 0819   HCT 34.9 11/25/2023 0819   PLT 330 11/25/2023 0819   MCV 83 11/25/2023 0819   MCH 24.8 (L) 11/25/2023 0819   MCH 20.0 (L) 07/07/2023 1458   MCHC 29.8 (L) 11/25/2023 0819   MCHC 29.3 (L) 07/07/2023 1458   RDW 14.4 11/25/2023 0819   Iron  Studies    Component Value Date/Time   IRON  32  11/25/2023 0819   TIBC 367 11/25/2023 0819   FERRITIN 12 (L) 11/25/2023 0819   IRONPCTSAT 9 (LL) 11/25/2023 0819   Lipid Panel     Component Value Date/Time   CHOL 207 (H) 11/25/2023 0819   TRIG 84 11/25/2023 0819   HDL 78 11/25/2023 0819   CHOLHDL 2.4 05/08/2023 0000   LDLCALC 114 (H) 11/25/2023 0819   Hepatic Function Panel     Component Value Date/Time   PROT 7.0 11/25/2023 0819   ALBUMIN  4.4 11/25/2023 0819   AST 21 11/25/2023 0819   AST 24 05/07/2022 1403   ALT 25 11/25/2023 0819   ALT 27 05/07/2022 1403   ALKPHOS 95 11/25/2023 0819   BILITOT 0.7 11/25/2023 0819   BILITOT 0.6 05/07/2022 1403      Component Value Date/Time   TSH 1.280 05/08/2023 0000   Nutritional Lab Results  Component Value Date   VD25OH 30.3 11/25/2023   VD25OH 23.2 (L) 11/11/2022   VD25OH 22.2 (L) 06/11/2022     Assessment and Plan    Obesity Actively working on weight loss, lost three pounds. Currently on a Toribio fast and walking for exercise. Discussed importance of maintaining protein intake to prevent metabolic slowdown. Discussed potential use of Ozempic  (Wegovy ) for weight loss, with a plan to start after the fast. Informed about the benefits of Ozempic  for weight loss and its weekly administration. - Start Ozempic  (Wegovy ) after completing the fast - Send prescription for Ozempic  (Wegovy ) to [facility] - Continue current exercise regimen - Maintain protein intake with green juice and whey protein -Use MyFitnessPal to track calories (1000-1200) and protein intake (75+ grams)   Prediabetes Fasting glucose slightly elevated at 101, A1c is 5.5. Discussed benefits of metformin  and Ozempic  (Wegovy ) for managing prediabetes. Plan to restart metformin  after the fast. Informed about the benefits of metformin  and Ozempic  in controlling blood glucose levels and weight management. - Restart metformin  after completing the fast - Monitor blood glucose levels  Elevated BP Blood  pressure readings elevated at 148/92 and 150/96. Discussed importance of managing blood pressure to prevent [place]-term cardiovascular risks. Reports anxiety during visits and variable blood pressure at home depending on salt intake. Informed about the risks of untreated hypertension including heart attack and arterial hardening. - Monitor blood pressure at home - Reduce salt intake - Consider antihypertensive medication if blood pressure remains elevated  Iron  Deficiency Anemia Hemoglobin improved from 7.9 to 10.4, goal is at least 11. Taking iron  supplements and experiencing heavy menstrual cycles. Upcoming OB appointment to discuss potential hysterectomy. Informed about the benefits of iron  supplementation and potential surgical options for heavy menstrual cycles. - Continue iron  supplements - Follow up with OB/GYN for heavy menstrual cycles and potential hysterectomy  Vitamin D  Deficiency Vitamin D  levels improved from 23 to 30, goal is 50-60. Currently on a prescription of 50,000 IU every seven days. - Continue  vitamin D  50,000 IU weekly - [facility] D  to [facility]  Follow-up - Follow up in three to four weeks.        She was informed of the importance of frequent follow up visits to maximize her success with intensive lifestyle modifications for her multiple health conditions.    Louann Penton, MD

## 2023-12-17 NOTE — Addendum Note (Signed)
 Addended by: Theola Sequin on: 12/17/2023 04:42 PM   Modules accepted: Orders

## 2023-12-18 ENCOUNTER — Other Ambulatory Visit (HOSPITAL_COMMUNITY): Payer: Self-pay

## 2023-12-18 ENCOUNTER — Other Ambulatory Visit: Payer: Self-pay

## 2024-01-10 ENCOUNTER — Encounter (HOSPITAL_COMMUNITY): Payer: Self-pay

## 2024-01-11 ENCOUNTER — Other Ambulatory Visit: Payer: Self-pay

## 2024-01-11 ENCOUNTER — Other Ambulatory Visit (HOSPITAL_COMMUNITY): Payer: Self-pay

## 2024-01-12 ENCOUNTER — Telehealth (INDEPENDENT_AMBULATORY_CARE_PROVIDER_SITE_OTHER): Payer: Self-pay | Admitting: *Deleted

## 2024-01-12 NOTE — Telephone Encounter (Signed)
PA SUBMITTED VIA [employer] FOR WEGOVY  Patricia Lin (Key: [id number])   Your information has been sent to [employer].

## 2024-01-13 ENCOUNTER — Ambulatory Visit (INDEPENDENT_AMBULATORY_CARE_PROVIDER_SITE_OTHER): Payer: No Typology Code available for payment source | Admitting: Family Medicine

## 2024-01-14 ENCOUNTER — Other Ambulatory Visit (INDEPENDENT_AMBULATORY_CARE_PROVIDER_SITE_OTHER): Payer: Self-pay | Admitting: Family Medicine

## 2024-01-14 DIAGNOSIS — E669 Obesity, unspecified: Secondary | ICD-10-CM

## 2024-01-14 DIAGNOSIS — Z6837 Body mass index (BMI) 37.0-37.9, adult: Secondary | ICD-10-CM

## 2024-01-18 NOTE — Telephone Encounter (Signed)
 Patricia Lin (Key: [id number])   [employer] has not yet replied to your PA request. Depending on the information you've provided, additional questions may be returned by the plan. You may close this dialog, return to your dashboard, and perform other tasks.  To check for an update later, open this request again from your dashboard.  If [employer] has not replied to your request within 24 hours please contact [employer] at [phone].

## 2024-01-19 NOTE — Telephone Encounter (Signed)
Medication denied, patient notified via Mychart message.

## 2024-02-06 ENCOUNTER — Other Ambulatory Visit (INDEPENDENT_AMBULATORY_CARE_PROVIDER_SITE_OTHER): Payer: Self-pay | Admitting: Family Medicine

## 2024-02-06 DIAGNOSIS — Z6837 Body mass index (BMI) 37.0-37.9, adult: Secondary | ICD-10-CM

## 2024-02-06 DIAGNOSIS — E669 Obesity, unspecified: Secondary | ICD-10-CM

## 2024-02-08 ENCOUNTER — Encounter (INDEPENDENT_AMBULATORY_CARE_PROVIDER_SITE_OTHER): Payer: Self-pay | Admitting: Family Medicine

## 2024-02-08 DIAGNOSIS — E669 Obesity, unspecified: Secondary | ICD-10-CM

## 2024-02-08 DIAGNOSIS — Z6837 Body mass index (BMI) 37.0-37.9, adult: Secondary | ICD-10-CM

## 2024-02-09 ENCOUNTER — Other Ambulatory Visit (HOSPITAL_COMMUNITY): Payer: Self-pay

## 2024-02-09 MED ORDER — WEGOVY 0.25 MG/0.5ML ~~LOC~~ SOAJ
0.2500 mg | SUBCUTANEOUS | 0 refills | Status: DC
Start: 2024-02-09 — End: 2024-02-15
  Filled 2024-02-09: qty 2, 28d supply, fill #0

## 2024-02-09 NOTE — Telephone Encounter (Signed)
 LAST APPOINTMENT DATE: 12/17/2023 NEXT APPOINTMENT DATE: 02/15/2024   [facility] [address] [place] [address] Phone: [phone] Fax: [phone]  [id number] [place], [place] - [address] [address] Phone: [phone] Fax: [phone]  [id number] - [place], [place] - [address] [place] [address] Phone: [phone] Fax: [phone]  Patient is requesting a refill of the following medications: Requested Prescriptions    No prescriptions requested or ordered in this encounter    Date last filled: 12/17/2023 Previously prescribed by Dr Dalbert Garnet  Lab Results  Component Value Date   HGBA1C 5.5 11/25/2023   HGBA1C 5.6 05/08/2023   HGBA1C 5.2 11/11/2022   Lab Results  Component Value Date   LDLCALC 114 (H) 11/25/2023   CREATININE 0.77 11/25/2023   Lab Results  Component Value Date   VD25OH 30.3 11/25/2023   VD25OH 23.2 (L) 11/11/2022   VD25OH 22.2 (L) 06/11/2022    BP Readings from Last 3 Encounters:  12/17/23 (!) 148/92  11/25/23 (!) 137/90  09/03/23 (!) 135/95

## 2024-02-09 NOTE — Telephone Encounter (Signed)
 Which medication ?

## 2024-02-09 NOTE — Telephone Encounter (Signed)
 Wegovy

## 2024-02-10 ENCOUNTER — Other Ambulatory Visit (HOSPITAL_COMMUNITY): Payer: Self-pay

## 2024-02-15 ENCOUNTER — Ambulatory Visit (INDEPENDENT_AMBULATORY_CARE_PROVIDER_SITE_OTHER): Payer: No Typology Code available for payment source | Admitting: Family Medicine

## 2024-02-15 ENCOUNTER — Telehealth: Payer: Self-pay

## 2024-02-15 ENCOUNTER — Encounter (INDEPENDENT_AMBULATORY_CARE_PROVIDER_SITE_OTHER): Payer: Self-pay | Admitting: Family Medicine

## 2024-02-15 VITALS — BP 127/86 | HR 75 | Temp 98.2°F | Ht 62.0 in | Wt 195.0 lb

## 2024-02-15 DIAGNOSIS — R632 Polyphagia: Secondary | ICD-10-CM | POA: Diagnosis not present

## 2024-02-15 DIAGNOSIS — K5900 Constipation, unspecified: Secondary | ICD-10-CM | POA: Diagnosis not present

## 2024-02-15 DIAGNOSIS — E88819 Insulin resistance, unspecified: Secondary | ICD-10-CM

## 2024-02-15 DIAGNOSIS — E669 Obesity, unspecified: Secondary | ICD-10-CM | POA: Diagnosis not present

## 2024-02-15 DIAGNOSIS — Z6835 Body mass index (BMI) 35.0-35.9, adult: Secondary | ICD-10-CM

## 2024-02-15 DIAGNOSIS — R7303 Prediabetes: Secondary | ICD-10-CM

## 2024-02-15 DIAGNOSIS — K5909 Other constipation: Secondary | ICD-10-CM

## 2024-02-15 MED ORDER — WEGOVY 0.25 MG/0.5ML ~~LOC~~ SOAJ: 0.2500 mg | SUBCUTANEOUS | 0 refills | Status: DC

## 2024-02-15 MED ORDER — METFORMIN HCL 500 MG PO TABS: 250.0000 mg | ORAL_TABLET | Freq: Two times a day (BID) | ORAL | 0 refills | Status: DC

## 2024-02-15 NOTE — Progress Notes (Signed)
 Office: [phone]  /  Fax: [phone]  WEIGHT SUMMARY AND BIOMETRICS  Anthropometric Measurements Height: 5\' 2"  (1.575 m) Weight: 195 lb (88.5 kg) BMI (Calculated): 35.66 Weight at Last Visit: 203 lb Weight Lost Since Last Visit: 7 lb Weight Gained Since Last Visit: 0 Starting Weight: 191 lb Total Weight Loss (lbs): 7 lb (3.175 kg)   Body Composition  Body Fat %: 40.4 % Fat Mass (lbs): 79 lbs Muscle Mass (lbs): 110.8 lbs Total Body Water (lbs): 73.2 lbs Visceral Fat Rating : 10   Other Clinical Data Fasting: no Labs: no Today's Visit #: 22 Starting Date: 09/08/18    Chief Complaint: OBESITY  History of Present Illness   The patient presents for obesity treatment and progress monitoring.  She has lost seven pounds over the last two months. She follows her category two eating plan about fifty percent of the time and exercises for sixty minutes four times per week.  She is currently on Wegovy at 0.25 mg weekly to help with polyphasia and experiences constipation as a side effect. Her constipation is characterized by less frequent, hard, and painful bowel movements.  She has a history of prediabetes and is working on diet, exercise, and weight loss to improve her condition. She is taking metformin, 500 mg, half a pill twice daily, but often forgets doses and sometimes takes a whole pill every other day instead.  No issues with metformin aside from forgetfulness. She mentions being 'phlegmy' but does not elaborate further.          PHYSICAL EXAM:  Blood pressure 127/86, pulse 75, temperature 98.2 F (36.8 C), height 5\' 2"  (1.575 m), weight 195 lb (88.5 kg), SpO2 100%. Body mass index is 35.67 kg/m.  DIAGNOSTIC DATA REVIEWED:  BMET    Component Value Date/Time   NA 141 11/25/2023 0819   K 3.9 11/25/2023 0819   CL 106 11/25/2023 0819   CO2 23 11/25/2023 0819   GLUCOSE 101 (H) 11/25/2023 0819   GLUCOSE 92 07/07/2023 1458   BUN 15 11/25/2023 0819    CREATININE 0.77 11/25/2023 0819   CREATININE 0.90 05/07/2022 1403   CREATININE 0.64 01/17/2013 1720   CALCIUM 9.4 11/25/2023 0819   GFRNONAA >60 07/07/2023 1458   GFRNONAA >60 05/07/2022 1403   GFRAA >60 09/04/2019 0130   Lab Results  Component Value Date   HGBA1C 5.5 11/25/2023   HGBA1C 5.2 09/08/2018   Lab Results  Component Value Date   INSULIN 16.3 11/25/2023   INSULIN 11.5 09/08/2018   Lab Results  Component Value Date   TSH 1.280 05/08/2023   CBC    Component Value Date/Time   WBC 4.2 11/25/2023 0819   WBC 3.9 (L) 07/07/2023 1458   RBC 4.19 11/25/2023 0819   RBC 3.95 07/07/2023 1458   HGB 10.4 (L) 11/25/2023 0819   HCT 34.9 11/25/2023 0819   PLT 330 11/25/2023 0819   MCV 83 11/25/2023 0819   MCH 24.8 (L) 11/25/2023 0819   MCH 20.0 (L) 07/07/2023 1458   MCHC 29.8 (L) 11/25/2023 0819   MCHC 29.3 (L) 07/07/2023 1458   RDW 14.4 11/25/2023 0819   Iron Studies    Component Value Date/Time   IRON 32 11/25/2023 0819   TIBC 367 11/25/2023 0819   FERRITIN 12 (L) 11/25/2023 0819   IRONPCTSAT 9 (LL) 11/25/2023 0819   Lipid Panel     Component Value Date/Time   CHOL 207 (H) 11/25/2023 0819   TRIG 84 11/25/2023 0819   HDL  78 11/25/2023 0819   CHOLHDL 2.4 05/08/2023 0000   LDLCALC 114 (H) 11/25/2023 0819   Hepatic Function Panel     Component Value Date/Time   PROT 7.0 11/25/2023 0819   ALBUMIN 4.4 11/25/2023 0819   AST 21 11/25/2023 0819   AST 24 05/07/2022 1403   ALT 25 11/25/2023 0819   ALT 27 05/07/2022 1403   ALKPHOS 95 11/25/2023 0819   BILITOT 0.7 11/25/2023 0819   BILITOT 0.6 05/07/2022 1403      Component Value Date/Time   TSH 1.280 05/08/2023 0000   Nutritional Lab Results  Component Value Date   VD25OH 30.3 11/25/2023   VD25OH 23.2 (L) 11/11/2022   VD25OH 22.2 (L) 06/11/2022     Assessment and Plan    Obesity   Patient has lost seven pounds in the last two months, adheres to her category two eating plan 50% of the time, and  exercises 60 minutes four times per week. She struggles with consuming all the food but does not experience hunger. Emphasis was placed on prioritizing protein intake from real food sources due to her higher thermic effect compared to shakes and bars.   - Prioritize protein intake from real food sources  -Continue to follow Category 2 plan more closely  - Continue current exercise regimen   - Follow up in four weeks    Polyphagia   Patient is on Wegovy 0.25 mg weekly with good weight loss progress but experiences constipation. Increasing the dose may worsen constipation and affect protein intake. Decision to reassess dosage at next visit if weight loss plateaus.   - Continue Wegovy 0.25 mg weekly   - [facility]   - Reassess dosage at next visit if weight loss plateaus    Constipation   Constipation, a side effect of Wegovy, is characterized by hard and painful bowel movements due to slowed GI transit and excessive water absorption. Discussed using stool softeners and Miralax to manage symptoms.   - Take stool softeners daily   - Consider Miralax if stool softeners are insufficient   - Continue stool softeners or Miralax as [place] as on Wegovy    Prediabetes   Patient manages prediabetes with diet, exercise, weight loss, and metformin 500 mg, half a pill BID. Reports inconsistent adherence due to forgetfulness. Discussed strategies to improve adherence, such as tying medication intake to a regular behavior or setting an alarm.   - Encourage consistent medication adherence   - Consider setting an alarm as a reminder   - [facility]    Follow-up   - Follow up in four weeks.        She was informed of the importance of frequent follow up visits to maximize her success with intensive lifestyle modifications for her multiple health conditions.    Quillian Quince, MD

## 2024-02-15 NOTE — Telephone Encounter (Signed)
Prior auth submitted for [employer].  Awaiting determination.

## 2024-02-16 NOTE — Telephone Encounter (Signed)
 RE: Melina Fiddler approved your request for coverage of Wegovy 0.25MG /0.5ML [employer]. Dear Arlyss Repress Mcswain: Verlon Au pleased to let you know that we've approved your or your doctor's request for coverage for Wegovy 0.25MG /0.5ML [place] [employer]. You can now fill your prescription, and it will be covered according to your plan. As Teaney as you remain covered by your prescription drug plan and there are no changes to your plan benefits, this request is approved from 02/15/2024 to 09/12/2024. When this approval expires, please speak to your doctor about your treatment.  Patient notified via my chart.

## 2024-03-02 NOTE — Telephone Encounter (Signed)
 I only increase medication at visits, we can discuss then.

## 2024-03-13 ENCOUNTER — Other Ambulatory Visit (INDEPENDENT_AMBULATORY_CARE_PROVIDER_SITE_OTHER): Payer: Self-pay | Admitting: Family Medicine

## 2024-03-13 DIAGNOSIS — R7303 Prediabetes: Secondary | ICD-10-CM

## 2024-03-14 ENCOUNTER — Encounter (INDEPENDENT_AMBULATORY_CARE_PROVIDER_SITE_OTHER): Payer: Self-pay | Admitting: Physician Assistant

## 2024-03-14 ENCOUNTER — Ambulatory Visit (INDEPENDENT_AMBULATORY_CARE_PROVIDER_SITE_OTHER): Admitting: Physician Assistant

## 2024-03-14 VITALS — BP 137/89 | HR 76 | Temp 97.8°F | Ht 62.0 in | Wt 197.0 lb

## 2024-03-14 DIAGNOSIS — Z6836 Body mass index (BMI) 36.0-36.9, adult: Secondary | ICD-10-CM | POA: Diagnosis not present

## 2024-03-14 DIAGNOSIS — E88819 Insulin resistance, unspecified: Secondary | ICD-10-CM

## 2024-03-14 DIAGNOSIS — R7303 Prediabetes: Secondary | ICD-10-CM

## 2024-03-14 DIAGNOSIS — E669 Obesity, unspecified: Secondary | ICD-10-CM | POA: Diagnosis not present

## 2024-03-14 DIAGNOSIS — E559 Vitamin D deficiency, unspecified: Secondary | ICD-10-CM | POA: Diagnosis not present

## 2024-03-14 MED ORDER — WEGOVY 0.5 MG/0.5ML ~~LOC~~ SOAJ: 0.5000 mg | SUBCUTANEOUS | 0 refills | Status: DC

## 2024-03-14 MED ORDER — VITAMIN D (ERGOCALCIFEROL) 1.25 MG (50000 UNIT) PO CAPS: 50000.0000 [IU] | ORAL_CAPSULE | ORAL | 0 refills | Status: DC

## 2024-03-14 NOTE — Progress Notes (Signed)
 SUBJECTIVE: Discussed the use of AI scribe software for clinical note transcription with the patient, who gave verbal consent to proceed.  Chief Complaint: Obesity  Interim History: She is up 2 lbs since last visit.   Patricia Lin is here to discuss her progress with her obesity treatment plan. She is on the Category 2 Plan and states she is following her eating plan approximately 50 % of the time. She states she is exercising 30 minutes 2 times per week.  Patricia Lin is a 43 year old female who presents for follow-up of her obesity treatment plan.  She is on a category two obesity treatment plan with approximately 50% adherence and has gained two pounds since her last visit. She engages in physical activity by walking for 30 minutes twice a week.  She has a history of type 2 diabetes with insulin resistance and is currently taking metformin, half a tablet twice a day, which helps with constipation. She had previously stopped taking it but resumed due to constipation issues. No current problems with the medication.  She experiences vitamin D deficiency and is currently out of her vitamin D supplements. Her last vitamin D level was 30, which is below the target range of 50 to 70.  She has iron deficiency anemia and continues her iron supplements.  Her dietary intake includes [place] bacon and eggs for breakfast, a sandwich for lunch, and difficulty consuming dinner due to lack of hunger. She attempts to include vegetables but prioritizes protein intake. She occasionally substitutes with yogurt but does not prefer it. She is considering protein shakes as an alternative to meet her protein requirements of 85 grams per day.   Pharmacotherapy: Wegovy 0.25 mg weekly- Started ~12/17/2023 . Denies mass in neck, dysphagia, dyspepsia, persistent hoarseness, abdominal pain, or N/V/Constipation or diarrhea. Has annual eye exam. Mood is stable. (Constipation improved after resumption of metformin.  )     Metformin for prediabetes/insulin resistance. No GI side effects.      Patricia Lin [id number]- No insurance coverage after March of 2024 OBJECTIVE: Visit Diagnoses: Problem List Items Addressed This Visit     Insulin resistance   Relevant Medications   Semaglutide-Weight Management (WEGOVY) 0.5 MG/0.5ML SOAJ   Vitamin D deficiency   Relevant Medications   Vitamin D, Ergocalciferol, (DRISDOL) 1.25 MG (50000 UNIT) CAPS capsule   Prediabetes - Primary   Relevant Medications   Semaglutide-Weight Management (WEGOVY) 0.5 MG/0.5ML SOAJ   Obesity, Beginning BMI 34.93   Relevant Medications   Semaglutide-Weight Management (WEGOVY) 0.5 MG/0.5ML SOAJ   Other Visit Diagnoses       BMI 36.0-36.9,adult Current BMI 36.1         Obesity with polyphagia Patricia Lin is on a category two obesity treatment plan with 50% adherence. She has gained two pounds since her last visit and is currently walking 30 minutes twice a week. She is in her third month of [facility] treatment, which has curbed her appetite and wants to increase the [facility] at this point. Initial constipation has resolved after she resumed low dose metformin.  She consumes 50% of her recommended protein intake, potentially affecting muscle mass and weight management. Polyphagia improved, but not eating enough protein. We discussed the importance of regular meals and adequate protein intake for weight loss and overall health.Discussed supplementing with a protein shake if needed to increase protein intake.    Increasing Wegovy to 0.5 mg is anticipated to aid weight management but will need to monitor closely.   Emphasis on  protein intake is crucial to prevent muscle loss and support weight loss.  Skipping meals can lead to metabolic slowdown and fat storage, which should be avoided. - Increase Wegovy dose to 0.5 mg and monitor intake closely.  - Encourage increased protein intake to 85 grams per day - Consider protein shakes as a supplement, not  a meal replacement - Advise against skipping meals to prevent metabolic slowdown - Encourage consumption of vegetables and fruits to aid with constipation - Refill Wegovy prescription at [facility] ordered this encounter  Medications   Semaglutide-Weight Management (WEGOVY) 0.5 MG/0.5ML SOAJ    Sig: Inject 0.5 mg into the skin once a week.    Dispense:  2 mL    Refill:  0   Vitamin D, Ergocalciferol, (DRISDOL) 1.25 MG (50000 UNIT) CAPS capsule    Sig: Take 1 capsule (50,000 Units total) by mouth every 7 (seven) days.    Dispense:  4 capsule    Refill:  0    Prediabetes with Insulin Resistance Patricia Lin is managing prediabetes with insulin resistance. She is taking metformin, which helps with constipation when taking Wegovy. Maintaining her medication regimen is essential to manage insulin resistance and support weight loss efforts. - Continue metformin 500 mg, half a tablet twice a day. No refill needed this visit. A1c stable/improved.  Lab Results  Component Value Date   HGBA1C 5.5 11/25/2023   HGBA1C 5.6 05/08/2023   HGBA1C 5.2 11/11/2022   Lab Results  Component Value Date   LDLCALC 114 (H) 11/25/2023   CREATININE 0.77 11/25/2023   INSULIN  Date Value Ref Range Status  11/25/2023 16.3 2.6 - 24.9 uIU/mL Final  ]Continue working on nutrition plan to decrease simple carbohydrates, increase lean proteins and exercise to promote weight loss, improve glycemic control and prevent progression to Type 2 diabetes.  Continue metformin 250 mg BID and Wegovy increased to 0.5 mg weekly.  Meds ordered this encounter  Medications   Semaglutide-Weight Management (WEGOVY) 0.5 MG/0.5ML SOAJ    Sig: Inject 0.5 mg into the skin once a week.    Dispense:  2 mL    Refill:  0   Vitamin D, Ergocalciferol, (DRISDOL) 1.25 MG (50000 UNIT) CAPS capsule    Sig: Take 1 capsule (50,000 Units total) by mouth every 7 (seven) days.    Dispense:  4 capsule    Refill:  0    Vitamin D Deficiency Patricia Lin's  vitamin D level is 30, within the normal range but below the target range of 50 to 70 for optimal effect. Increasing vitamin D levels to the target range is expected to provide more benefits. Last vitamin D Lab Results  Component Value Date   VD25OH 30.3 11/25/2023   Refill/Continue Ergocalciferol 50,000 units weekly.  Low vitamin D levels can be associated with adiposity and may result in leptin resistance and weight gain. Also associated with fatigue.  Currently on vitamin D supplementation without any adverse effects such as nausea, vomiting or muscle weakness.  - Aim to increase vitamin D levels to 50-70 Meds ordered this encounter  Medications   Semaglutide-Weight Management (WEGOVY) 0.5 MG/0.5ML SOAJ    Sig: Inject 0.5 mg into the skin once a week.    Dispense:  2 mL    Refill:  0   Vitamin D, Ergocalciferol, (DRISDOL) 1.25 MG (50000 UNIT) CAPS capsule    Sig: Take 1 capsule (50,000 Units total) by mouth every 7 (seven) days.    Dispense:  4 capsule  Refill:  0    Vitals Temp: 97.8 F (36.6 C) BP: 137/89 Pulse Rate: 76 SpO2: 100 %   Anthropometric Measurements Height: 5\' 2"  (1.575 m) Weight: 197 lb (89.4 kg) BMI (Calculated): 36.02 Weight at Last Visit: 195lb Weight Lost Since Last Visit: 0lb Weight Gained Since Last Visit: 2lb Starting Weight: 191lb Total Weight Loss (lbs): 0 lb (0 kg)   Body Composition  Body Fat %: 40.5 % Fat Mass (lbs): 80 lbs Muscle Mass (lbs): 111.6 lbs Total Body Water (lbs): 74.8 lbs Visceral Fat Rating : 10   Other Clinical Data Fasting: Yes Labs: No Today's Visit #: 23 Starting Date: 09/08/18     ASSESSMENT AND PLAN:  Diet: Vicci is currently in the action stage of change. As such, her goal is to continue with weight loss efforts. She has agreed to Category 2 Plan.  Exercise: Hazel has been instructed to work up to a goal of 150 minutes of combined cardio and strengthening exercise per week for weight loss and  overall health benefits.   Behavior Modification:  We discussed the following Behavioral Modification Strategies today: increasing lean protein intake, decreasing simple carbohydrates, increasing vegetables, increase H2O intake, increase high fiber foods, no skipping meals, meal planning and cooking strategies, avoiding temptations, and planning for success. We discussed various medication options to help Ranya with her weight loss efforts and we both agreed to increase Wegovy to 0.5 mg weekly for medical weight loss and continue to work on nutritional and behavioral strategies to promote weight loss.  .  Return in about 4 weeks (around 04/11/2024).Marland Kitchen She was informed of the importance of frequent follow up visits to maximize her success with intensive lifestyle modifications for her multiple health conditions.  Attestation Statements:   Reviewed by clinician on day of visit: allergies, medications, problem list, medical history, surgical history, family history, social history, and previous encounter notes.   Time spent on visit including pre-visit chart review and post-visit care and charting was 39 minutes.    Leomar Westberg, PA-C

## 2024-04-06 ENCOUNTER — Encounter (INDEPENDENT_AMBULATORY_CARE_PROVIDER_SITE_OTHER): Payer: Self-pay | Admitting: Family Medicine

## 2024-04-06 ENCOUNTER — Other Ambulatory Visit (INDEPENDENT_AMBULATORY_CARE_PROVIDER_SITE_OTHER): Payer: Self-pay | Admitting: Physician Assistant

## 2024-04-06 DIAGNOSIS — E88819 Insulin resistance, unspecified: Secondary | ICD-10-CM

## 2024-04-06 DIAGNOSIS — E669 Obesity, unspecified: Secondary | ICD-10-CM

## 2024-04-06 DIAGNOSIS — R7303 Prediabetes: Secondary | ICD-10-CM

## 2024-04-06 NOTE — Telephone Encounter (Signed)
 Call to patient to schedule earlier appointment.  Patient has scheduled for Monday May 5 at 2:30 PM with Shawn.

## 2024-04-08 ENCOUNTER — Ambulatory Visit
Admission: RE | Admit: 2024-04-08 | Discharge: 2024-04-08 | Disposition: A | Discharge status: Home / Self Care | Source: Ambulatory Visit | Attending: Nurse Practitioner

## 2024-04-08 VITALS — BP 145/94 | HR 89 | Temp 97.9°F | Resp 18 | Wt 197.1 lb

## 2024-04-08 DIAGNOSIS — N3000 Acute cystitis without hematuria: Secondary | ICD-10-CM | POA: Diagnosis present

## 2024-04-08 DIAGNOSIS — R300 Dysuria: Secondary | ICD-10-CM | POA: Diagnosis present

## 2024-04-08 DIAGNOSIS — J029 Acute pharyngitis, unspecified: Secondary | ICD-10-CM | POA: Insufficient documentation

## 2024-04-08 LAB — POCT URINALYSIS DIP (MANUAL ENTRY)
Bilirubin, UA: NEGATIVE
Glucose, UA: NEGATIVE mg/dL
Ketones, POC UA: NEGATIVE mg/dL
Nitrite, UA: NEGATIVE
Protein Ur, POC: NEGATIVE mg/dL
Spec Grav, UA: 1.01
Urobilinogen, UA: 0.2 U/dL
pH, UA: 5.5

## 2024-04-08 LAB — POCT RAPID STREP A (OFFICE): Rapid Strep A Screen: NEGATIVE

## 2024-04-08 LAB — POCT URINE PREGNANCY: Preg Test, Ur: NEGATIVE

## 2024-04-08 MED ORDER — NITROFURANTOIN MONOHYD MACRO 100 MG PO CAPS: 100.0000 mg | ORAL_CAPSULE | Freq: Two times a day (BID) | ORAL | 0 refills | Status: DC

## 2024-04-08 MED ORDER — PHENAZOPYRIDINE HCL 200 MG PO TABS: 200.0000 mg | ORAL_TABLET | Freq: Three times a day (TID) | ORAL | 0 refills | Status: DC

## 2024-04-08 NOTE — Discharge Instructions (Addendum)
 You were seen today for symptoms consistent with a urinary tract infection (UTI). You have been prescribed Macrobid  to treat the infection and Pyridium to help relieve discomfort such as burning, urgency, and bladder pressure. Take the antibiotics exactly as prescribed and complete the full course, even if you start feeling better. Pyridium may cause your urine to change color, which is a normal side effect of the medication. A urine culture has been sent to identify the specific bacteria causing the infection and to confirm that the prescribed antibiotic is appropriate. You will only be contacted if your results are abnormal; otherwise, you may review them in your MyChart account.   It is important to stay well hydrated by drinking plenty of fluids throughout the day. This helps flush out your urinary system and keeps your urine light yellow, which is a sign of good hydration. Avoid caffeine  and alcohol , as they can irritate the bladder. Be sure to urinate regularly and empty your bladder fully. Do not hold your urine for extended periods. Always wipe from front to back after using the bathroom and use a clean tissue for each wipe. It is also important to urinate after sexual activity. Avoid douching or using sprays or powders in the genital area, as these can cause irritation.   Your sore throat is most likely due to viral pharyngitis, which means inflammation of the throat caused by a virus. This is a very common reason for a sore throat. While pharyngitis can sometimes be caused by bacteria such as strep, your test today was negative, which means your symptoms are not from strep throat. Because this is a viral infection, antibiotics are not needed, as they only work against bacterial infections. To help with throat discomfort, you can sip warm liquids like broth, tea, or warm water. Cold or frozen drinks and snacks, like ice pops, can also soothe your throat. Gargling with warm salt water three to four times  a day may provide relief, and sucking on hard candy or throat lozenges can help as well. Using a cool-mist humidifier at night can keep the air moist and make breathing easier. Tylenol  or ibuprofen  can be taken if you have any pain or fever.

## 2024-04-08 NOTE — ED Triage Notes (Signed)
 Think I have a UTI - Entered by patient  Pt c/o UTI sxs x 3 days and sore throat x 2 days. Pt says she feels pressure after using the bathroom.

## 2024-04-08 NOTE — ED Provider Notes (Signed)
 [facility]    CSN: [id number] Arrival date & time: 04/08/24  1840      History   Chief Complaint Chief Complaint  Patient presents with   Urinary Frequency    Think I have a UTI - Entered by patient    HPI Patricia Lin is a 43 y.o. female.   Patricia Lin is a 43 y.o. female presents with symptoms concerning for UTI.  Onset 3 days ago.  She endorses urinary frequency and dysuria.  She denies any vaginal discharge, genital sores, odor, low back pain or flank pain.  Patient also complains of a sore throat that started 1 day ago.  She denies any fevers, runny nose, congestion, postnasal drainage, cough, nausea, vomiting, diarrhea, body aches or headaches.  Her son was sick several weeks ago.   The following portions of the patient's history were reviewed and updated as appropriate: allergies, current medications, past family history, past medical history, past social history, past surgical history, and problem list.          Past Medical History:  Diagnosis Date   Abnormal Pap smear    Adenomyosis    Anemia    Anxiety    Back pain    BV (bacterial vaginosis) 2011   Chlamydia infection    CIN I (cervical intraepithelial neoplasia I) 2008   Depression    Dysmenorrhea    Dyspnea    Dysrhythmia 2009   TACHYCARDIA;   HAD W/U 2010? 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  B/P nl, and 122/76 on repeat-sitting   Postpartum depression 2011   NO MEDS   Pregnancy induced hypertension    ALL PREGNANCIES   Shortness of breath    WITH EXERTION SICNE PREGNANCY   Tachycardia    Tachycardia 2010   HAS HAD WORKUP. UNSURE IF HAS POSSIBLE HEART VALVE ISSUE   Vulvitis 2009    Patient Active Problem List   Diagnosis Date Noted   Other insomnia 07/01/2023   BMI 37.0-37.9, adult 01/22/2023   Obesity, Beginning BMI 34.93 01/22/2023   Binge-Eating Disorder, Mild 09/29/2022   Prediabetes 08/12/2022   Depression 08/12/2022   Class 1 obesity with serious comorbidity and body mass index (BMI) of 34.0 to 34.9 in adult 08/12/2022   Iron  deficiency anemia 07/29/2022   Insulin  resistance 06/28/2022   Essential hypertension 06/28/2022   Iron  deficiency 06/28/2022   Vitamin D  deficiency 06/28/2022   Other hyperlipidemia 06/28/2022   At risk of diabetes mellitus 06/28/2022   Iron  deficiency anemia due to chronic blood loss 05/07/2022   Right lower quadrant abdominal pain 01/28/2019   Generalized headaches 12/17/2018   Anxiety 12/17/2018  History of anemia 05/26/2018   Anemia 12/05/2017   Routine health maintenance 03/03/2013   S/P tubal ligation 02/09/2013   Vaginal delivery 01/09/2013   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   Fracture of left ulna 09/27/12 10/23/2012   Cervical funneling 09/18/2012   Cervical shortening 09/18/2012   Symptomatic anemia 09/03/2012   H/O pre-eclampsia in prior pregnancy, currently pregnant 08/19/2012   Chronic hypertension in pregnancy 08/19/2012   Hx LEEP (loop electrosurgical excision procedure), cervix, pregnancy 08/19/2012   Hx of postpartum depression, currently pregnant 08/19/2012   Late prenatal care 08/19/2012   Hx of precipitous labor and deliveries, antepartum 08/19/2012   Muscle tension HAs 08/19/2012    Past Surgical History:  Procedure Laterality Date   BREAST REDUCTION SURGERY   12/08/2006   BREAST SURGERY     CERVICAL BIOPSY  W/ LOOP ELECTRODE EXCISION  2010   DILATION AND CURETTAGE OF UTERUS     FINGER SURGERY     Right index finger   TUBAL LIGATION  01/09/2013   Procedure: POST PARTUM TUBAL LIGATION;  Surgeon: Norville Beery, MD;  Location: [facility];  Service: Gynecology;  Laterality: Bilateral;  post partum tubal ligation bilateral   US  ECHOCARDIOGRAPHY  06/18/2009   ef 55-60%   WISDOM TOOTH EXTRACTION      OB History     Gravida  7   Para  4   Term  4   Preterm      AB  3   Living  4      SAB  1   IAB  2   Ectopic      Multiple      Live Births  4        Obstetric Comments  2011 PRECLAMPSIA PP; IN ICU X 3 DAYS          Home Medications    Prior to Admission medications   Medication Sig Start Date End Date Taking? Authorizing Provider  nitrofurantoin , macrocrystal-monohydrate, (MACROBID ) 100 MG capsule Take 1 capsule (100 mg total) by mouth 2 (two) times daily. 04/08/24  Yes Clatie Kessen, FNP  phenazopyridine (PYRIDIUM) 200 MG tablet Take 1 tablet (200 mg total) by mouth 3 (three) times daily. 04/08/24  Yes Maryruth Sol, FNP  ferrous sulfate  325 (65 FE) MG tablet TAKE 1 TABLET BY MOUTH 3 TIMES DAILY WITH MEALS. 11/16/22   Marlene Simas, MD  Insulin  Pen Needle (BD PEN NEEDLE NANO 2ND GEN) 32G X 4 MM MISC Use 1 needle daily to inject medication. 11/11/22   Rayburn, Evangelina Hilt, PA-C  metFORMIN  (GLUCOPHAGE ) 500 MG tablet Take 1/2 tablet (250 mg total) by mouth 2 (two) times daily, with breakfast and lunch 02/15/24   Jasmine Mesi D, MD  Semaglutide -Weight Management (WEGOVY ) 0.5 MG/0.5ML SOAJ Inject 0.5 mg into the skin once a week. 03/14/24   Rayburn, Evangelina Hilt, PA-C  Vitamin D , Ergocalciferol , (DRISDOL ) 1.25 MG (50000 UNIT) CAPS capsule Take 1 capsule (50,000 Units total) by mouth every 7 (seven) days. 03/14/24   Rayburn, Evangelina Hilt, PA-C    Family History Family History  Problem Relation Age of Onset    Hypertension Mother    Hyperlipidemia Mother    Coronary artery disease Mother    Heart disease Mother        high cholesterol   Asthma Mother    Diabetes Mother    Depression Mother    Anxiety disorder Mother    Sleep apnea Mother    Obesity Mother    Eating disorder  Mother    Hypertension Father    High Cholesterol Father    Heart disease Father    Hypertension Maternal Grandmother    Hypertension Maternal Grandfather    Asthma Daughter    Alcohol  abuse Maternal Uncle    Drug abuse Maternal Uncle     Social History Social History   Tobacco Use   Smoking status: Never   Smokeless tobacco: Never  Vaping Use   Vaping status: Never Used  Substance Use Topics   Alcohol  use: Yes    Comment: socially   Drug use: No     Allergies   Latex   Review of Systems Review of Systems  Constitutional:  Negative for fever.  HENT:  Positive for sore throat. Negative for congestion, postnasal drip, rhinorrhea, sneezing and trouble swallowing.   Respiratory:  Negative for cough.   Gastrointestinal:  Negative for abdominal pain, nausea and vomiting.  Genitourinary:  Positive for dysuria and frequency. Negative for vaginal discharge.  Musculoskeletal:  Negative for back pain and myalgias.  Neurological:  Positive for headaches.     Physical Exam Triage Vital Signs ED Triage Vitals  Encounter Vitals Group     BP 04/08/24 1911 (!) 145/94     Systolic BP Percentile --      Diastolic BP Percentile --      Pulse Rate 04/08/24 1911 89     Resp 04/08/24 1911 18     Temp 04/08/24 1911 97.9 F (36.6 C)     Temp Source 04/08/24 1911 Oral     SpO2 04/08/24 1911 97 %     Weight 04/08/24 1909 197 lb 1.5 oz (89.4 kg)     Height --      Head Circumference --      Peak Flow --      Pain Score 04/08/24 1908 7     Pain Loc --      Pain Education --      Exclude from Growth Chart --    No data found.  Updated Vital Signs BP (!) 145/94 (BP Location: Right Arm)   Pulse 89   Temp  97.9 F (36.6 C) (Oral)   Resp 18   Wt 197 lb 1.5 oz (89.4 kg)   LMP 04/04/2024 (Exact Date)   SpO2 97%   BMI 36.05 kg/m   Visual Acuity Right Eye Distance:   Left Eye Distance:   Bilateral Distance:    Right Eye Near:   Left Eye Near:    Bilateral Near:     Physical Exam Vitals and nursing note reviewed.  Constitutional:      General: She is not in acute distress.    Appearance: Normal appearance. She is well-developed. She is not ill-appearing, toxic-appearing or diaphoretic.  HENT:     Head: Normocephalic and atraumatic.     Nose: Nose normal.     Mouth/Throat:     Lips: Pink.     Mouth: Mucous membranes are moist.     Pharynx: Uvula midline. No oropharyngeal exudate or posterior oropharyngeal erythema.  Eyes:     Conjunctiva/sclera: Conjunctivae normal.  Cardiovascular:     Rate and Rhythm: Normal rate and regular rhythm.     Heart sounds: No murmur heard. Pulmonary:     Effort: Pulmonary effort is normal. No respiratory distress.     Breath sounds: Normal breath sounds.  Abdominal:     Palpations: Abdomen is soft.     Tenderness: There is no abdominal tenderness.  Genitourinary:  Comments: Deferred  Musculoskeletal:        General: No swelling. Normal range of motion.     Cervical back: Normal range of motion and neck supple.  Lymphadenopathy:     Cervical: Cervical adenopathy (nontender) present.  Skin:    General: Skin is warm and dry.     Capillary Refill: Capillary refill takes less than 2 seconds.  Neurological:     General: No focal deficit present.     Mental Status: She is alert and oriented to person, place, and time.  Psychiatric:        Mood and Affect: Mood normal.        Behavior: Behavior normal.      UC Treatments / Results  Labs (all labs ordered are listed, but only abnormal results are displayed) Labs Reviewed  POCT URINALYSIS DIP (MANUAL ENTRY) - Abnormal; Notable for the following components:      Result Value   Blood, UA  trace-intact (*)    Leukocytes, UA Small (1+) (*)    All other components within normal limits  POCT URINE PREGNANCY - Normal  POCT RAPID STREP A (OFFICE) - Normal  URINE CULTURE    EKG   Radiology No results found.  Procedures Procedures (including critical care time)  Medications Ordered in UC Medications - No data to display  Initial Impression / Assessment and Plan / UC Course  I have reviewed the triage vital signs and the nursing notes.  Pertinent labs & imaging results that were available during my care of the patient were reviewed by me and considered in my medical decision making (see chart for details).    43 year old female presenting with symptoms concerning for urinary tract infection and sore throat. Rapid strep test was negative. Urinalysis showed a small amount of leukocytes, and urine pregnancy test was negative. Physical exam as documented; patient is afebrile and nontoxic in appearance. Based on clinical presentation and findings, patient was prescribed Macrobid  for UTI treatment and Pyridium for symptom relief. Urine culture was obtained and is currently pending. Supportive care measures were advised for sore throat, as no signs of bacterial infection are present. Patient instructed to follow up with her primary care provider as needed or if symptoms worsen.  Today's evaluation has revealed no signs of a dangerous process. Discussed diagnosis with patient and/or guardian. Patient and/or guardian aware of their diagnosis, possible red flag symptoms to watch out for and need for close follow up. Patient and/or guardian understands verbal and written discharge instructions. Patient and/or guardian comfortable with plan and disposition.  Patient and/or guardian has a clear mental status at this time, good insight into illness (after discussion and teaching) and has clear judgment to make decisions regarding their care  Documentation was completed with the aid of voice  recognition software. Transcription may contain typographical errors. Final Clinical Impressions(s) / UC Diagnoses   Final diagnoses:  Dysuria  Acute cystitis without hematuria  Acute sore throat     Discharge Instructions      You were seen today for symptoms consistent with a urinary tract infection (UTI). You have been prescribed Macrobid  to treat the infection and Pyridium to help relieve discomfort such as burning, urgency, and bladder pressure. Take the antibiotics exactly as prescribed and complete the full course, even if you start feeling better. Pyridium may cause your urine to change color, which is a normal side effect of the medication. A urine culture has been sent to identify the specific bacteria causing  the infection and to confirm that the prescribed antibiotic is appropriate. You will only be contacted if your results are abnormal; otherwise, you may review them in your MyChart account.   It is important to stay well hydrated by drinking plenty of fluids throughout the day. This helps flush out your urinary system and keeps your urine light yellow, which is a sign of good hydration. Avoid caffeine  and alcohol , as they can irritate the bladder. Be sure to urinate regularly and empty your bladder fully. Do not hold your urine for extended periods. Always wipe from front to back after using the bathroom and use a clean tissue for each wipe. It is also important to urinate after sexual activity. Avoid douching or using sprays or powders in the genital area, as these can cause irritation.   Your sore throat is most likely due to viral pharyngitis, which means inflammation of the throat caused by a virus. This is a very common reason for a sore throat. While pharyngitis can sometimes be caused by bacteria such as strep, your test today was negative, which means your symptoms are not from strep throat. Because this is a viral infection, antibiotics are not needed, as they only work  against bacterial infections. To help with throat discomfort, you can sip warm liquids like broth, tea, or warm water. Cold or frozen drinks and snacks, like ice pops, can also soothe your throat. Gargling with warm salt water three to four times a day may provide relief, and sucking on hard candy or throat lozenges can help as well. Using a cool-mist humidifier at night can keep the air moist and make breathing easier. Tylenol  or ibuprofen  can be taken if you have any pain or fever.      ED Prescriptions     Medication Sig Dispense Auth. Provider   nitrofurantoin , macrocrystal-monohydrate, (MACROBID ) 100 MG capsule Take 1 capsule (100 mg total) by mouth 2 (two) times daily. 10 capsule Maryruth Sol, FNP   phenazopyridine (PYRIDIUM) 200 MG tablet Take 1 tablet (200 mg total) by mouth 3 (three) times daily. 6 tablet Maryruth Sol, FNP      PDMP not reviewed this encounter.   Maryruth Sol, [place] 04/08/24 2021

## 2024-04-10 LAB — URINE CULTURE: Culture: 70000 — AB

## 2024-04-10 NOTE — Progress Notes (Unsigned)
 SUBJECTIVE: Discussed the use of AI scribe software for clinical note transcription with the patient, who gave verbal consent to proceed.  Chief Complaint: Obesity  Interim History: She is down 3 lbs since her last visit.   Patricia Lin is here to discuss her progress with her obesity treatment plan. She is on the Category 1 Plan and states she is following her eating plan approximately 50 % of the time. She states she is not exercising.   Pharmacotherapy: Wegovy  0.25 mg weekly- Started ~12/17/2023 . Denies mass in neck, dysphagia, dyspepsia, persistent hoarseness, abdominal pain, or N/V/Constipation or diarrhea. Has annual eye exam. Mood is stable. (Constipation improved after resumption of metformin . )                                           Metformin  for prediabetes/insulin  resistance. No GI side effects.                                        Saxenda  [id number]- No insurance coverage after March of 2024  Patricia Lin is a 43 year old female with obesity who presents for a follow-up on her weight loss treatment plan.  She has been recently started on Wegovy  for insulin  resistance and medical weight loss, which she tolerates well. She has lost three pounds since her last visit and notes increased physical activity, which may have contributed to her weight loss.  She has tried protein shakes, specifically 'Core Power' ones, but did not like the texture. Despite this, she has increased her muscle mass by 1.4 pounds. She is not paying much attention to hunger or cravings due to being busy but tries to maintain three meals a day, supplementing with protein shakes when necessary. She is aiming for at least 75 grams of protein a day.  She is experiencing stress, primarily due to the recent death of her brother-in-law, which has affected her blood pressure. She describes her work environment as stressful and mentions not taking breaks regularly. She uses an under-the-desk treadmill at work to manage  stress. We discussed going outside for sunshine, which maybe helpful for de-stressing and improving sleep.  Her past experience with Saxenda  resulted in significant weight loss. She engages in physical activities such as walking on the treadmill, lifting weights, and using a sauna for relaxation. She is open to incorporating more structured exercise routines.  She is currently taking vitamin D  once a week, as her levels were low in December.    OBJECTIVE: Visit Diagnoses: Problem List Items Addressed This Visit     Insulin  resistance   Vitamin D  deficiency   Relevant Medications   Vitamin D , Ergocalciferol , (DRISDOL ) 1.25 MG (50000 UNIT) CAPS capsule   Obesity, Beginning BMI 34.93   Relevant Medications   Semaglutide -Weight Management (WEGOVY ) 1 MG/0.5ML SOAJ   Other Visit Diagnoses       Elevated blood pressure reading without diagnosis of hypertension    -  Primary     BMI 35.0-35.9,adult Current BMI 35.6          Obesity Wegovy  has facilitated a weight loss of nearly five pounds and an increase in muscle mass by 1.4 pounds. Discussed Wegovy 's benefits for blood pressure control and overall health, emphasizing protein intake and exercise, including HIIT, to support weight  loss and muscle maintenance. Highlighted the risk of metabolism suppression without exercise. - Increase Wegovy  dose to 1 mg weekly - Encourage protein intake of 25-30 grams per meal - Recommend HIIT training exercises from YouTube - Encourage weightlifting and regular exercise - Monitor weight and muscle mass Meds ordered this encounter  Medications   Vitamin D , Ergocalciferol , (DRISDOL ) 1.25 MG (50000 UNIT) CAPS capsule    Sig: Take 1 capsule (50,000 Units total) by mouth every 7 (seven) days.    Dispense:  4 capsule    Refill:  0   Semaglutide -Weight Management (WEGOVY ) 1 MG/0.5ML SOAJ    Sig: Inject 1 mg into the skin once a week.    Dispense:  2 mL    Refill:  0   Hypertension Hypertension needs  further observation and not currently on medication .  Medication(s): none  BP Readings from Last 3 Encounters:  04/11/24 (!) 159/99  04/08/24 (!) 145/94  03/14/24 137/89   Lab Results  Component Value Date   CREATININE 0.77 11/25/2023   CREATININE 0.86 07/07/2023   CREATININE 0.74 05/08/2023   No results found for: "GFR"  Plan: Continue all antihypertensives at current dosages. Hypertension may be elevated due to stress from recent bereavement, with no significant past elevation. Monitor closely and begin treatment if remains elevated  Insulin  resistance Insulin  resistance is managed with Wegovy , which may also aid in weight loss and improve blood pressure. Lab Results  Component Value Date   HGBA1C 5.5 11/25/2023   HGBA1C 5.6 05/08/2023   HGBA1C 5.2 11/11/2022   Lab Results  Component Value Date   LDLCALC 114 (H) 11/25/2023   CREATININE 0.77 11/25/2023   INSULIN   Date Value Ref Range Status  11/25/2023 16.3 2.6 - 24.9 uIU/mL Final  ]Continue working on nutrition plan to decrease simple carbohydrates, increase lean proteins and exercise to promote weight loss, improve glycemic control and prevent progression to Type 2 diabetes.  Continue Wegovy  and monitor. Meds ordered this encounter  Medications   Vitamin D , Ergocalciferol , (DRISDOL ) 1.25 MG (50000 UNIT) CAPS capsule    Sig: Take 1 capsule (50,000 Units total) by mouth every 7 (seven) days.    Dispense:  4 capsule    Refill:  0   Semaglutide -Weight Management (WEGOVY ) 1 MG/0.5ML SOAJ    Sig: Inject 1 mg into the skin once a week.    Dispense:  2 mL    Refill:  0    Vitamin D  deficiency Vitamin D  deficiency persists despite supplementation, likely due to poor absorption related to skin pigmentation. - Continue vitamin D  supplementation once a week Meds ordered this encounter  Medications   Vitamin D , Ergocalciferol , (DRISDOL ) 1.25 MG (50000 UNIT) CAPS capsule    Sig: Take 1 capsule (50,000 Units total) by  mouth every 7 (seven) days.    Dispense:  4 capsule    Refill:  0   Semaglutide -Weight Management (WEGOVY ) 1 MG/0.5ML SOAJ    Sig: Inject 1 mg into the skin once a week.    Dispense:  2 mL    Refill:  0     Vitals Temp: 98 F (36.7 C) BP: (!) 159/99 Pulse Rate: 79 SpO2: 100 %   Anthropometric Measurements Height: 5\' 2"  (1.575 m) Weight: 194 lb (88 kg) BMI (Calculated): 35.47 Weight at Last Visit: 197 lb Weight Lost Since Last Visit: 3 lb Weight Gained Since Last Visit: 0 Starting Weight: 191 lb Total Weight Loss (lbs): 0 lb (0 kg) Peak Weight: 203 lb  Body Composition  Body Fat %: 38.9 % Fat Mass (lbs): 75.6 lbs Muscle Mass (lbs): 113 lbs Total Body Water (lbs): 77.2 lbs Visceral Fat Rating : 9   Other Clinical Data Fasting: no Labs: no Today's Visit #: 24 Starting Date: 09/08/18     ASSESSMENT AND PLAN:  Diet: Anai is currently in the action stage of change. As such, her goal is to continue with weight loss efforts. She has agreed to Category 1 Plan.  Exercise: Suman has been instructed to work up to a goal of 150 minutes of combined cardio and strengthening exercise per week for weight loss and overall health benefits.   Behavior Modification:  We discussed the following Behavioral Modification Strategies today: increasing lean protein intake, decreasing simple carbohydrates, increasing vegetables, increase H2O intake, increase high fiber foods, no skipping meals, avoiding temptations, and planning for success. We discussed various medication options to help Sumayya with her weight loss efforts and we both agreed to increase Wegovy  to 1 mg weekly for medical weight loss and continue to work on nutritional and behavioral strategies to promote weight loss.  .  Return in about 4 weeks (around 05/09/2024).Aaron Aas She was informed of the importance of frequent follow up visits to maximize her success with intensive lifestyle modifications for her multiple health  conditions.  Attestation Statements:   Reviewed by clinician on day of visit: allergies, medications, problem list, medical history, surgical history, family history, social history, and previous encounter notes.   Time spent on visit including pre-visit chart review and post-visit care and charting was 31 minutes.    Anyelina Claycomb, PA-C

## 2024-04-11 ENCOUNTER — Ambulatory Visit (INDEPENDENT_AMBULATORY_CARE_PROVIDER_SITE_OTHER): Admitting: Physician Assistant

## 2024-04-11 ENCOUNTER — Encounter (INDEPENDENT_AMBULATORY_CARE_PROVIDER_SITE_OTHER): Payer: Self-pay | Admitting: Physician Assistant

## 2024-04-11 VITALS — BP 159/99 | HR 79 | Temp 98.0°F | Ht 62.0 in | Wt 194.0 lb

## 2024-04-11 DIAGNOSIS — Z6835 Body mass index (BMI) 35.0-35.9, adult: Secondary | ICD-10-CM

## 2024-04-11 DIAGNOSIS — E88819 Insulin resistance, unspecified: Secondary | ICD-10-CM | POA: Diagnosis not present

## 2024-04-11 DIAGNOSIS — E669 Obesity, unspecified: Secondary | ICD-10-CM | POA: Diagnosis not present

## 2024-04-11 DIAGNOSIS — E559 Vitamin D deficiency, unspecified: Secondary | ICD-10-CM | POA: Diagnosis not present

## 2024-04-11 DIAGNOSIS — K5909 Other constipation: Secondary | ICD-10-CM

## 2024-04-11 DIAGNOSIS — R7303 Prediabetes: Secondary | ICD-10-CM

## 2024-04-11 DIAGNOSIS — R030 Elevated blood-pressure reading, without diagnosis of hypertension: Secondary | ICD-10-CM | POA: Diagnosis not present

## 2024-04-11 MED ORDER — WEGOVY 1 MG/0.5ML ~~LOC~~ SOAJ: 1.0000 mg | SUBCUTANEOUS | 0 refills | Status: DC

## 2024-04-11 MED ORDER — VITAMIN D (ERGOCALCIFEROL) 1.25 MG (50000 UNIT) PO CAPS
50000.0000 [IU] | ORAL_CAPSULE | ORAL | 0 refills | Status: DC
Start: 2024-04-11 — End: 2024-05-05

## 2024-04-18 ENCOUNTER — Ambulatory Visit (INDEPENDENT_AMBULATORY_CARE_PROVIDER_SITE_OTHER): Admitting: Family Medicine

## 2024-05-05 ENCOUNTER — Ambulatory Visit (INDEPENDENT_AMBULATORY_CARE_PROVIDER_SITE_OTHER): Admitting: Family Medicine

## 2024-05-05 ENCOUNTER — Encounter (INDEPENDENT_AMBULATORY_CARE_PROVIDER_SITE_OTHER): Payer: Self-pay | Admitting: Family Medicine

## 2024-05-05 VITALS — BP 152/100 | HR 83 | Temp 98.1°F | Ht 62.0 in | Wt 193.0 lb

## 2024-05-05 DIAGNOSIS — R030 Elevated blood-pressure reading, without diagnosis of hypertension: Secondary | ICD-10-CM | POA: Diagnosis not present

## 2024-05-05 DIAGNOSIS — R7303 Prediabetes: Secondary | ICD-10-CM

## 2024-05-05 DIAGNOSIS — Z6835 Body mass index (BMI) 35.0-35.9, adult: Secondary | ICD-10-CM

## 2024-05-05 DIAGNOSIS — E559 Vitamin D deficiency, unspecified: Secondary | ICD-10-CM | POA: Diagnosis not present

## 2024-05-05 DIAGNOSIS — E669 Obesity, unspecified: Secondary | ICD-10-CM

## 2024-05-05 DIAGNOSIS — Z6834 Body mass index (BMI) 34.0-34.9, adult: Secondary | ICD-10-CM

## 2024-05-05 DIAGNOSIS — R632 Polyphagia: Secondary | ICD-10-CM | POA: Diagnosis not present

## 2024-05-05 MED ORDER — SEMAGLUTIDE-WEIGHT MANAGEMENT 1.7 MG/0.75ML ~~LOC~~ SOAJ: 1.7000 mg | SUBCUTANEOUS | 0 refills | Status: DC

## 2024-05-05 MED ORDER — VITAMIN D (ERGOCALCIFEROL) 1.25 MG (50000 UNIT) PO CAPS: 50000.0000 [IU] | ORAL_CAPSULE | ORAL | 0 refills | Status: DC

## 2024-05-05 NOTE — Progress Notes (Signed)
 Office: [phone]  /  Fax: [phone]  WEIGHT SUMMARY AND BIOMETRICS  Anthropometric Measurements Height: 5\' 2"  (1.575 m) Weight: 193 lb (87.5 kg) BMI (Calculated): 35.29 Weight at Last Visit: 194 lb Weight Lost Since Last Visit: 1 lb Weight Gained Since Last Visit: 0 Starting Weight: 191 lb Total Weight Loss (lbs): 0 lb (0 kg) Peak Weight: 203 lb   Body Composition  Body Fat %: 40.4 % Fat Mass (lbs): 78.2 lbs Muscle Mass (lbs): 109.4 lbs Total Body Water (lbs): 74.8 lbs Visceral Fat Rating : 10   Other Clinical Data Fasting: yes Labs: no Today's Visit #: 25 Starting Date: 09/08/18    Chief Complaint: OBESITY   History of Present Illness Patricia Lin is a 43 year old female who presents for follow-up on her obesity management and treatment progress.  She adheres to the category one eating plan 75% of the time and engages in walking for exercise for 30 minutes twice a week. She has lost one pound in the last three weeks since her last visit.  She has a history of vitamin D  deficiency and is on prescription vitamin D , 50,000 IU per week. She requests a refill of her prescription vitamin D .  She manages her prediabetes with diet, exercise, and metformin . She sometimes forgets to take metformin , especially when going to work, due to gastrointestinal issues. She requests a refill of her metformin  prescription.  She experiences polyphagia and is on Wegovy , currently at a 1 mg dose per week. She finds it less effective compared to Saxenda  and requests a refill.  She has elevated blood pressures, which she attributes to anxiety during office visits. She monitors her blood pressure at home.      PHYSICAL EXAM:  Blood pressure (!) 152/100, pulse 83, temperature 98.1 F (36.7 C), height 5\' 2"  (1.575 m), weight 193 lb (87.5 kg), last menstrual period 04/04/2024, SpO2 100%. Body mass index is 35.3 kg/m.  DIAGNOSTIC DATA REVIEWED:  BMET    Component Value  Date/Time   NA 141 11/25/2023 0819   K 3.9 11/25/2023 0819   CL 106 11/25/2023 0819   CO2 23 11/25/2023 0819   GLUCOSE 101 (H) 11/25/2023 0819   GLUCOSE 92 07/07/2023 1458   BUN 15 11/25/2023 0819   CREATININE 0.77 11/25/2023 0819   CREATININE 0.90 05/07/2022 1403   CREATININE 0.64 01/17/2013 1720   CALCIUM 9.4 11/25/2023 0819   GFRNONAA >60 07/07/2023 1458   GFRNONAA >60 05/07/2022 1403   GFRAA >60 09/04/2019 0130   Lab Results  Component Value Date   HGBA1C 5.5 11/25/2023   HGBA1C 5.2 09/08/2018   Lab Results  Component Value Date   INSULIN  16.3 11/25/2023   INSULIN  11.5 09/08/2018   Lab Results  Component Value Date   TSH 1.280 05/08/2023   CBC    Component Value Date/Time   WBC 4.2 11/25/2023 0819   WBC 3.9 (L) 07/07/2023 1458   RBC 4.19 11/25/2023 0819   RBC 3.95 07/07/2023 1458   HGB 10.4 (L) 11/25/2023 0819   HCT 34.9 11/25/2023 0819   PLT 330 11/25/2023 0819   MCV 83 11/25/2023 0819   MCH 24.8 (L) 11/25/2023 0819   MCH 20.0 (L) 07/07/2023 1458   MCHC 29.8 (L) 11/25/2023 0819   MCHC 29.3 (L) 07/07/2023 1458   RDW 14.4 11/25/2023 0819   Iron  Studies    Component Value Date/Time   IRON  32 11/25/2023 0819   TIBC 367 11/25/2023 0819   FERRITIN 12 (L)  11/25/2023 0819   IRONPCTSAT 9 (LL) 11/25/2023 0819   Lipid Panel     Component Value Date/Time   CHOL 207 (H) 11/25/2023 0819   TRIG 84 11/25/2023 0819   HDL 78 11/25/2023 0819   CHOLHDL 2.4 05/08/2023 0000   LDLCALC 114 (H) 11/25/2023 0819   Hepatic Function Panel     Component Value Date/Time   PROT 7.0 11/25/2023 0819   ALBUMIN 4.4 11/25/2023 0819   AST 21 11/25/2023 0819   AST 24 05/07/2022 1403   ALT 25 11/25/2023 0819   ALT 27 05/07/2022 1403   ALKPHOS 95 11/25/2023 0819   BILITOT 0.7 11/25/2023 0819   BILITOT 0.6 05/07/2022 1403      Component Value Date/Time   TSH 1.280 05/08/2023 0000   Nutritional Lab Results  Component Value Date   VD25OH 30.3 11/25/2023   VD25OH 23.2 (L)  11/11/2022   VD25OH 22.2 (L) 06/11/2022     Assessment and Plan Assessment & Plan Obesity Obesity management is ongoing with a category one eating plan, adhered to 75% of the time. Exercise includes walking for 30 minutes twice weekly. Weight loss of 1 pound in the last 3 weeks. Reports boredom with the current meal plan. Discussed using ChatGPT for meal planning to meet nutritional goals and maintain interest. - Continue category one eating plan with flexibility for personalized meal planning - Encourage use of ChatGPT for meal planning - Increase physical activity as tolerated  Polyphagia Polyphagia is managed with Wegovy . Reports current 1 mg dose is less effective than previous medication (Saxenda ). Agreed to increase the dose to improve efficacy. - Increase Wegovy  dose - Refill Wegovy  prescription  Prediabetes Prediabetes is managed with diet, exercise, and metformin . Reports gastrointestinal issues with metformin , leading to occasional missed doses. Discussed importance of adherence despite side effects. - Refill metformin  prescription - Continue dietary and exercise interventions  White coat syndrome Elevated blood pressure readings in the office, with today's readings at 146/92 mmHg and 152/100 mmHg. Reports normal blood pressure readings at home, attributing office readings to anxiety. Recommended home monitoring for accurate assessment. - Check blood pressure at home at least once or ideally twice a week - Ensure home blood pressure readings remain below 140/90 mmHg - Discuss blood pressure management with primary care physician at upcoming visit  Vitamin D  deficiency Vitamin D  deficiency is well-managed on current prescription of 50,000 IU per week. Requests a refill. - Refill vitamin D  prescription      She was informed of the importance of frequent follow up visits to maximize her success with intensive lifestyle modifications for her multiple health conditions.     Jasmine Mesi, MD

## 2024-05-09 ENCOUNTER — Ambulatory Visit (INDEPENDENT_AMBULATORY_CARE_PROVIDER_SITE_OTHER): Payer: Medicaid Other | Admitting: Family

## 2024-05-09 ENCOUNTER — Encounter: Payer: Self-pay | Admitting: Family

## 2024-05-09 VITALS — BP 133/82 | HR 76 | Temp 98.5°F | Resp 16 | Ht 62.0 in | Wt 194.0 lb

## 2024-05-09 DIAGNOSIS — M549 Dorsalgia, unspecified: Secondary | ICD-10-CM

## 2024-05-09 DIAGNOSIS — Z13228 Encounter for screening for other metabolic disorders: Secondary | ICD-10-CM

## 2024-05-09 DIAGNOSIS — Z13220 Encounter for screening for lipoid disorders: Secondary | ICD-10-CM

## 2024-05-09 DIAGNOSIS — Z0000 Encounter for general adult medical examination without abnormal findings: Secondary | ICD-10-CM

## 2024-05-09 DIAGNOSIS — Z130 Encounter for screening for diseases of the blood and blood-forming organs and certain disorders involving the immune mechanism: Secondary | ICD-10-CM

## 2024-05-09 DIAGNOSIS — B379 Candidiasis, unspecified: Secondary | ICD-10-CM

## 2024-05-09 DIAGNOSIS — R634 Abnormal weight loss: Secondary | ICD-10-CM

## 2024-05-09 DIAGNOSIS — H9319 Tinnitus, unspecified ear: Secondary | ICD-10-CM

## 2024-05-09 DIAGNOSIS — Z131 Encounter for screening for diabetes mellitus: Secondary | ICD-10-CM

## 2024-05-09 DIAGNOSIS — M542 Cervicalgia: Secondary | ICD-10-CM

## 2024-05-09 DIAGNOSIS — Z1329 Encounter for screening for other suspected endocrine disorder: Secondary | ICD-10-CM

## 2024-05-09 DIAGNOSIS — G8929 Other chronic pain: Secondary | ICD-10-CM

## 2024-05-09 MED ORDER — MELOXICAM 7.5 MG PO TABS: 7.5000 mg | ORAL_TABLET | Freq: Every day | ORAL | 0 refills | Status: DC

## 2024-05-09 MED ORDER — NYSTATIN-TRIAMCINOLONE 100000-0.1 UNIT/GM-% EX OINT: 1.0000 | TOPICAL_OINTMENT | Freq: Two times a day (BID) | CUTANEOUS | 2 refills | Status: DC

## 2024-05-09 NOTE — Progress Notes (Signed)
 Ringing in her ear and fupa be itching was her iron  and hemoglobin check

## 2024-05-09 NOTE — Progress Notes (Signed)
 Patient ID: Patricia Lin, female    DOB: [birth year]  MRN: [id number]  CC: Annual Exam  Subjective: Patricia Lin is a 43 y.o. female who presents for annual exam.   Her concerns today include:  - Requests iron  lab. - Chronic neck and chronic back pain. Denies recent trauma/injury and red flag symptoms. States she plans to schedule an appointment with Chiropractic due to her health insurance does not require a referral. - States "ringing in head" for a while. Denies red flag symptoms. She would like to see a specialist. - States itching underneath abdominal area.  - States she has lost weight and would like referral to a specialist for a tummy tuck.  - Established with Gynecology for women's health screenings/maintenance.  Patient Active Problem List   Diagnosis Date Noted   Other insomnia 07/01/2023   BMI 37.0-37.9, adult 01/22/2023   Obesity, Beginning BMI 34.93 01/22/2023   Binge-Eating Disorder, Mild 09/29/2022   Prediabetes 08/12/2022   Depression 08/12/2022   Class 1 obesity with serious comorbidity and body mass index (BMI) of 34.0 to 34.9 in adult 08/12/2022   Iron  deficiency anemia 07/29/2022   Insulin  resistance 06/28/2022   Essential hypertension 06/28/2022   Iron  deficiency 06/28/2022   Vitamin D  deficiency 06/28/2022   Other hyperlipidemia 06/28/2022   At risk of diabetes mellitus 06/28/2022   Iron  deficiency anemia due to chronic blood loss 05/07/2022   Right lower quadrant abdominal pain 01/28/2019   Generalized headaches 12/17/2018   Anxiety 12/17/2018   History of anemia 05/26/2018   Anemia 12/05/2017   Routine health maintenance 03/03/2013   S/P tubal ligation 02/09/2013   Vaginal delivery 01/09/2013   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   Fracture of left ulna 09/27/12 10/23/2012   Cervical funneling 09/18/2012   Cervical shortening 09/18/2012   Symptomatic anemia 09/03/2012   H/O pre-eclampsia in prior pregnancy, currently pregnant 08/19/2012   Chronic  hypertension in pregnancy 08/19/2012   Hx LEEP (loop electrosurgical excision procedure), cervix, pregnancy 08/19/2012   Hx of postpartum depression, currently pregnant 08/19/2012   Late prenatal care 08/19/2012   Hx of precipitous labor and deliveries, antepartum 08/19/2012   Muscle tension HAs 08/19/2012     Current Outpatient Medications on File Prior to Visit  Medication Sig Dispense Refill   ferrous sulfate  325 (65 FE) MG tablet TAKE 1 TABLET BY MOUTH 3 TIMES DAILY WITH MEALS. 90 tablet 3   metFORMIN  (GLUCOPHAGE ) 500 MG tablet Take 1/2 tablet (250 mg total) by mouth 2 (two) times daily, with breakfast and lunch 30 tablet 0   [START ON 07/31/2024] Semaglutide -Weight Management 1.7 MG/0.75ML SOAJ Inject 1.7 mg into the skin once a week for 28 days. 3 mL 0   Vitamin D , Ergocalciferol , (DRISDOL ) 1.25 MG (50000 UNIT) CAPS capsule Take 1 capsule (50,000 Units total) by mouth every 7 (seven) days. 4 capsule 0   Insulin  Pen Needle (BD PEN NEEDLE NANO 2ND GEN) 32G X 4 MM MISC Use 1 needle daily to inject medication. (Patient not taking: Reported on 05/09/2024) 100 each 0   nitrofurantoin , macrocrystal-monohydrate, (MACROBID ) 100 MG capsule Take 1 capsule (100 mg total) by mouth 2 (two) times daily. (Patient not taking: Reported on 05/09/2024) 10 capsule 0   phenazopyridine  (PYRIDIUM ) 200 MG tablet Take 1 tablet (200 mg total) by mouth 3 (three) times daily. (Patient not taking: Reported on 05/09/2024) 6 tablet 0   Semaglutide -Weight Management (WEGOVY ) 1 MG/0.5ML SOAJ Inject 1 mg into the skin once a week. (Patient not  taking: Reported on 05/09/2024) 2 mL 0   Current Facility-Administered Medications on File Prior to Visit  Medication Dose Route Frequency Provider Last Rate Last Admin   triamcinolone  acetonide (KENALOG -40) injection 40 mg  40 mg Intramuscular Once Senaida Dama, NP        Allergies  Allergen Reactions   Latex Itching and Rash    Social History   Socioeconomic History   Marital  status: Married    Spouse name: Sheliah Deutscher   Number of children: 3   Years of education: 13   Highest education level: Not on file  Occupational History   Occupation: Physiological scientist: [employer]   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  Tobacco Use   Smoking status: Never   Smokeless tobacco: Never  Vaping Use   Vaping status: Never Used  Substance and Sexual Activity   Alcohol  use: Yes    Comment: socially   Drug use: No   Sexual activity: Yes    Partners: Male    Birth control/protection: Surgical  Other Topics Concern   Not on file  Social History Narrative   Not on file   Social Drivers of Health   Financial Resource Strain: Low Risk  (05/09/2024)   Overall Financial Resource Strain (CARDIA)    Difficulty of Paying Living Expenses: Not hard at all  Food Insecurity: Food Insecurity Present (05/09/2024)   Hunger Vital Sign    Worried About Running Out of Food in the Last Year: Sometimes true    Ran Out of Food in the Last Year: Sometimes true  Transportation Needs: No Transportation Needs (05/09/2024)   PRAPARE - Administrator, Civil Service (Medical): No    Lack of Transportation (Non-Medical): No  Physical Activity: Insufficiently Active (05/09/2024)   Exercise Vital Sign    Days of Exercise per Week: 2 days    Minutes of Exercise per Session: 30 min  Stress: Stress Concern Present (05/09/2024)   [employer] of Occupational Health - Occupational Stress Questionnaire    Feeling of Stress : Rather much  Social Connections: Moderately Integrated (05/09/2024)   Social Connection and Isolation Panel [NHANES]    Frequency of Communication with Friends and Family: More than three times a week    Frequency of Social Gatherings with Friends and Family: More than three times a week    Attends Religious Services: More than 4 times per year    Active Member of [employer] or Organizations: No    Attends Banker Meetings: Never    Marital  Status: Married  Catering manager Violence: Not At Risk (05/09/2024)   Humiliation, Afraid, Rape, and Kick questionnaire    Fear of Current or Ex-Partner: No    Emotionally Abused: No    Physically Abused: No    Sexually Abused: No    Family History  Problem Relation Age of Onset   Hypertension Mother    Hyperlipidemia Mother    Coronary artery disease Mother    Heart disease Mother        high cholesterol   Asthma Mother    Diabetes Mother    Depression Mother    Anxiety disorder Mother    Sleep apnea Mother    Obesity Mother    Eating disorder Mother    Hypertension Father    High Cholesterol Father    Heart disease Father    Hypertension Maternal Grandmother    Hypertension Maternal Grandfather  Asthma Daughter    Alcohol  abuse Maternal Uncle    Drug abuse Maternal Uncle     Past Surgical History:  Procedure Laterality Date   BREAST REDUCTION SURGERY  12/08/2006   BREAST SURGERY     CERVICAL BIOPSY  W/ LOOP ELECTRODE EXCISION  2010   DILATION AND CURETTAGE OF UTERUS     FINGER SURGERY     Right index finger   TUBAL LIGATION  01/09/2013   Procedure: POST PARTUM TUBAL LIGATION;  Surgeon: Norville Beery, MD;  Location: [facility];  Service: Gynecology;  Laterality: Bilateral;  post partum tubal ligation bilateral   US  ECHOCARDIOGRAPHY  06/18/2009   ef 55-60%   WISDOM TOOTH EXTRACTION      ROS: Review of Systems Negative except as stated above  PHYSICAL EXAM: BP 133/82   Pulse 76   Temp 98.5 F (36.9 C) (Oral)   Resp 16   Ht 5\' 2"  (1.575 m)   Wt 194 lb (88 kg)   LMP 04/24/2024 (Exact Date)   SpO2 99%   BMI 35.48 kg/m   Physical Exam HENT:     Head: Normocephalic and atraumatic.     Right Ear: Tympanic membrane, ear canal and external ear normal.     Left Ear: Tympanic membrane, ear canal and external ear normal.     Nose: Nose normal.     Mouth/Throat:     Mouth: Mucous membranes are moist.     Pharynx: Oropharynx is clear.  Eyes:     Extraocular  Movements: Extraocular movements intact.     Conjunctiva/sclera: Conjunctivae normal.     Pupils: Pupils are equal, round, and reactive to light.  Neck:     Thyroid : No thyroid  mass, thyromegaly or thyroid  tenderness.  Cardiovascular:     Rate and Rhythm: Normal rate and regular rhythm.     Pulses: Normal pulses.     Heart sounds: Normal heart sounds.  Pulmonary:     Effort: Pulmonary effort is normal.     Breath sounds: Normal breath sounds.  Chest:     Comments: Patient declined. Abdominal:     General: Bowel sounds are normal.     Palpations: Abdomen is soft.  Genitourinary:    Comments: Patient declined. Musculoskeletal:        General: Normal range of motion.     Right shoulder: Normal.     Left shoulder: Normal.     Right upper arm: Normal.     Left upper arm: Normal.     Right elbow: Normal.     Left elbow: Normal.     Right forearm: Normal.     Left forearm: Normal.     Right wrist: Normal.     Left wrist: Normal.     Right hand: Normal.     Left hand: Normal.     Cervical back: Normal, normal range of motion and neck supple.     Thoracic back: Normal.     Lumbar back: Normal.     Right hip: Normal.     Left hip: Normal.     Right upper leg: Normal.     Left upper leg: Normal.     Right knee: Normal.     Left knee: Normal.     Right lower leg: Normal.     Left lower leg: Normal.     Right ankle: Normal.     Left ankle: Normal.     Right foot: Normal.     Left foot:  Normal.  Skin:    General: Skin is warm and dry.     Capillary Refill: Capillary refill takes less than 2 seconds.  Neurological:     General: No focal deficit present.     Mental Status: She is alert and oriented to person, place, and time.  Psychiatric:        Mood and Affect: Mood normal.        Behavior: Behavior normal.     ASSESSMENT AND PLAN: 1. Annual physical exam (Primary) - Counseled on 150 minutes of exercise per week as tolerated, healthy eating (including decreased daily  intake of saturated fats, cholesterol, added sugars, sodium), STI prevention, and routine healthcare maintenance.  2. Screening for metabolic disorder - Routine screening.  - CMP14+EGFR  3. Screening for deficiency anemia - Routine screening.  - CBC - Iron , TIBC and Ferritin Panel  4. Diabetes mellitus screening - Routine screening.  - Hemoglobin A1c  5. Screening cholesterol level - Routine screening.  - Lipid panel  6. Thyroid  disorder screen - Routine screening.  - TSH  7. Chronic neck pain 8. Chronic back pain, unspecified back location, unspecified back pain laterality - Meloxicam as prescribed. Counseled on medication adherence/adverse effects.  - Follow-up with primary provider as scheduled. - meloxicam (MOBIC) 7.5 MG tablet; Take 1 tablet (7.5 mg total) by mouth daily.  Dispense: 90 tablet; Refill: 0  9. Tinnitus, unspecified laterality - Patient states "ringing in head" and denies red flag symptoms. - Referral to Neurology for evaluation/management. - Ambulatory referral to Neurology  10. Candida infection - Nystatin-Triamcinolone  ointment as prescribed. Counseled on medication adherence/adverse effects.  - Follow-up with primary provider as scheduled. - nystatin-triamcinolone  ointment (MYCOLOG); Apply 1 Application topically 2 (two) times daily.  Dispense: 60 g; Refill: 2  11. Loss of weight - Referral to Plastic Surgery for evaluation/management. - Ambulatory referral to Plastic Surgery   Patient was given the opportunity to ask questions.  Patient verbalized understanding of the plan and was able to repeat key elements of the plan. Patient was given clear instructions to go to Emergency Department or return to medical center if symptoms don't improve, worsen, or new problems develop.The patient verbalized understanding.   Orders Placed This Encounter  Procedures   CBC   Lipid panel   CMP14+EGFR   Hemoglobin A1c   TSH   Iron , TIBC and Ferritin Panel    Ambulatory referral to Neurology   Ambulatory referral to Plastic Surgery     Requested Prescriptions   Signed Prescriptions Disp Refills   meloxicam (MOBIC) 7.5 MG tablet 90 tablet 0    Sig: Take 1 tablet (7.5 mg total) by mouth daily.   nystatin-triamcinolone  ointment (MYCOLOG) 60 g 2    Sig: Apply 1 Application topically 2 (two) times daily.    Return in about 1 year (around 05/09/2025) for Physical per patient preference.  Senaida Dama, NP

## 2024-05-10 ENCOUNTER — Ambulatory Visit: Payer: Self-pay | Admitting: Family

## 2024-05-10 DIAGNOSIS — D509 Iron deficiency anemia, unspecified: Secondary | ICD-10-CM

## 2024-05-10 DIAGNOSIS — Z13220 Encounter for screening for lipoid disorders: Secondary | ICD-10-CM

## 2024-05-10 DIAGNOSIS — E611 Iron deficiency: Secondary | ICD-10-CM

## 2024-05-10 LAB — CMP14+EGFR
ALT: 28 IU/L (ref 0–32)
AST: 20 IU/L (ref 0–40)
Albumin: 4.5 g/dL (ref 3.9–4.9)
Alkaline Phosphatase: 86 IU/L (ref 44–121)
BUN/Creatinine Ratio: 23 (ref 9–23)
BUN: 16 mg/dL (ref 6–24)
Bilirubin Total: 0.4 mg/dL (ref 0.0–1.2)
CO2: 19 mmol/L — ABNORMAL LOW (ref 20–29)
Calcium: 9.3 mg/dL (ref 8.7–10.2)
Chloride: 106 mmol/L (ref 96–106)
Creatinine, Ser: 0.69 mg/dL (ref 0.57–1.00)
Globulin, Total: 3.1 g/dL (ref 1.5–4.5)
Glucose: 83 mg/dL (ref 70–99)
Potassium: 4.5 mmol/L (ref 3.5–5.2)
Sodium: 141 mmol/L (ref 134–144)
Total Protein: 7.6 g/dL (ref 6.0–8.5)
eGFR: 110 mL/min/{1.73_m2} (ref >59)

## 2024-05-10 LAB — TSH: TSH: 0.918 u[IU]/mL (ref 0.450–4.500)

## 2024-05-10 LAB — LIPID PANEL
Chol/HDL Ratio: 2.7 ratio (ref 0.0–4.4)
Cholesterol, Total: 199 mg/dL (ref 100–199)
HDL: 75 mg/dL (ref >39)
LDL Chol Calc (NIH): 112 mg/dL — ABNORMAL HIGH (ref 0–99)
Triglycerides: 67 mg/dL (ref 0–149)
VLDL Cholesterol Cal: 12 mg/dL (ref 5–40)

## 2024-05-10 LAB — IRON,TIBC AND FERRITIN PANEL
Ferritin: 5 ng/mL — ABNORMAL LOW (ref 15–150)
Iron Saturation: 3 % — CL (ref 15–55)
Iron: 13 ug/dL — ABNORMAL LOW (ref 27–159)
Total Iron Binding Capacity: 398 ug/dL (ref 250–450)
UIBC: 385 ug/dL (ref 131–425)

## 2024-05-10 LAB — CBC
Hematocrit: 32.2 % — ABNORMAL LOW (ref 34.0–46.6)
Hemoglobin: 8.9 g/dL — ABNORMAL LOW (ref 11.1–15.9)
MCH: 20.8 pg — ABNORMAL LOW (ref 26.6–33.0)
MCHC: 27.6 g/dL — ABNORMAL LOW (ref 31.5–35.7)
MCV: 75 fL — ABNORMAL LOW (ref 79–97)
Platelets: 436 10*3/uL (ref 150–450)
RBC: 4.27 x10E6/uL (ref 3.77–5.28)
RDW: 16.9 % — ABNORMAL HIGH (ref 11.7–15.4)
WBC: 4.1 10*3/uL (ref 3.4–10.8)

## 2024-05-10 LAB — HEMOGLOBIN A1C
Est. average glucose Bld gHb Est-mCnc: 97 mg/dL
Hgb A1c MFr Bld: 5 % (ref 4.8–5.6)

## 2024-05-10 MED ORDER — IRON (FERROUS SULFATE) 325 (65 FE) MG PO TABS: 325.0000 mg | ORAL_TABLET | Freq: Every day | ORAL | 0 refills | Status: DC

## 2024-05-19 ENCOUNTER — Ambulatory Visit (INDEPENDENT_AMBULATORY_CARE_PROVIDER_SITE_OTHER): Admitting: Adult Health

## 2024-05-24 ENCOUNTER — Other Ambulatory Visit: Payer: Self-pay | Admitting: Medical Oncology

## 2024-05-24 DIAGNOSIS — D509 Iron deficiency anemia, unspecified: Secondary | ICD-10-CM

## 2024-05-26 ENCOUNTER — Encounter (INDEPENDENT_AMBULATORY_CARE_PROVIDER_SITE_OTHER): Payer: Self-pay | Admitting: Family Medicine

## 2024-05-26 ENCOUNTER — Ambulatory Visit (INDEPENDENT_AMBULATORY_CARE_PROVIDER_SITE_OTHER): Admitting: Family Medicine

## 2024-05-26 VITALS — BP 130/84 | HR 78 | Temp 98.1°F | Ht 62.0 in | Wt 189.0 lb

## 2024-05-26 DIAGNOSIS — E559 Vitamin D deficiency, unspecified: Secondary | ICD-10-CM | POA: Diagnosis not present

## 2024-05-26 DIAGNOSIS — R7303 Prediabetes: Secondary | ICD-10-CM | POA: Diagnosis not present

## 2024-05-26 DIAGNOSIS — E282 Polycystic ovarian syndrome: Secondary | ICD-10-CM

## 2024-05-26 DIAGNOSIS — E669 Obesity, unspecified: Secondary | ICD-10-CM | POA: Diagnosis not present

## 2024-05-26 DIAGNOSIS — E66811 Obesity, class 1: Secondary | ICD-10-CM

## 2024-05-26 DIAGNOSIS — Z6834 Body mass index (BMI) 34.0-34.9, adult: Secondary | ICD-10-CM

## 2024-05-26 DIAGNOSIS — Z6835 Body mass index (BMI) 35.0-35.9, adult: Secondary | ICD-10-CM

## 2024-05-26 MED ORDER — METFORMIN HCL 500 MG PO TABS: 250.0000 mg | ORAL_TABLET | Freq: Two times a day (BID) | ORAL | 0 refills | Status: DC

## 2024-05-26 MED ORDER — VITAMIN D (ERGOCALCIFEROL) 1.25 MG (50000 UNIT) PO CAPS: 50000.0000 [IU] | ORAL_CAPSULE | ORAL | 0 refills | Status: DC

## 2024-05-26 MED ORDER — SEMAGLUTIDE-WEIGHT MANAGEMENT 1.7 MG/0.75ML ~~LOC~~ SOAJ: 1.7000 mg | SUBCUTANEOUS | 0 refills | Status: DC

## 2024-05-26 NOTE — Progress Notes (Signed)
 Office: [phone]  /  Fax: [phone]  WEIGHT SUMMARY AND BIOMETRICS  Anthropometric Measurements Height: 5' 2 (1.575 m) Weight: 189 lb (85.7 kg) BMI (Calculated): 34.56 Weight at Last Visit: 193 lb Weight Lost Since Last Visit: 4 lb Weight Gained Since Last Visit: 0 Starting Weight: 191 lb Total Weight Loss (lbs): 2 lb (0.907 kg) Peak Weight: 203 lb   Body Composition  Body Fat %: 39.8 % Fat Mass (lbs): 75.4 lbs Muscle Mass (lbs): 108.4 lbs Total Body Water (lbs): 74.6 lbs Visceral Fat Rating : 9   Other Clinical Data Fasting: no Labs: no Today's Visit #: 26 Starting Date: 09/08/18    Chief Complaint: OBESITY    History of Present Illness Patricia Lin is a 43 year old female with obesity who presents for a follow-up on her treatment plan and progress.  She has been adhering to the category one eating plan approximately 75% of the time. Despite limited exercise during her vacation in the [place], she has lost four pounds in the last month. Her activities included significant walking and sweating, which may have contributed to her weight loss. She is maintaining good protein intake, particularly with steak and salmon.  She has been on Wegovy  at a dose of 1.7 mg for almost four weeks without experiencing nausea, a side effect she encountered with lower doses. She is also taking metformin  and vitamin D  without any issues. She is considering increasing her Wegovy  dose.  She plans to increase her physical activity now that she is back from vacation, intending to run the track about three times a week and continue her home exercises, including sauna sessions twice a week. She has upcoming travel plans to [facility] in July and [place] in August, and she is mindful of maintaining her diet and hydration during these trips.      PHYSICAL EXAM:  Blood pressure 130/84, pulse 78, temperature 98.1 F (36.7 C), height 5' 2 (1.575 m), weight 189 lb (85.7 kg),  last menstrual period 04/24/2024, SpO2 100%. Body mass index is 34.57 kg/m.  DIAGNOSTIC DATA REVIEWED:  BMET    Component Value Date/Time   NA 141 05/09/2024 1102   K 4.5 05/09/2024 1102   CL 106 05/09/2024 1102   CO2 19 (L) 05/09/2024 1102   GLUCOSE 83 05/09/2024 1102   GLUCOSE 92 07/07/2023 1458   BUN 16 05/09/2024 1102   CREATININE 0.69 05/09/2024 1102   CREATININE 0.90 05/07/2022 1403   CREATININE 0.64 01/17/2013 1720   CALCIUM 9.3 05/09/2024 1102   GFRNONAA >60 07/07/2023 1458   GFRNONAA >60 05/07/2022 1403   GFRAA >60 09/04/2019 0130   Lab Results  Component Value Date   HGBA1C 5.0 05/09/2024   HGBA1C 5.2 09/08/2018   Lab Results  Component Value Date   INSULIN  16.3 11/25/2023   INSULIN  11.5 09/08/2018   Lab Results  Component Value Date   TSH 0.918 05/09/2024   CBC    Component Value Date/Time   WBC 4.1 05/09/2024 1102   WBC 3.9 (L) 07/07/2023 1458   RBC 4.27 05/09/2024 1102   RBC 3.95 07/07/2023 1458   HGB 8.9 (L) 05/09/2024 1102   HCT 32.2 (L) 05/09/2024 1102   PLT 436 05/09/2024 1102   MCV 75 (L) 05/09/2024 1102   MCH 20.8 (L) 05/09/2024 1102   MCH 20.0 (L) 07/07/2023 1458   MCHC 27.6 (L) 05/09/2024 1102   MCHC 29.3 (L) 07/07/2023 1458   RDW 16.9 (H) 05/09/2024 1102  Iron  Studies    Component Value Date/Time   IRON  13 (L) 05/09/2024 1102   TIBC 398 05/09/2024 1102   FERRITIN 5 (L) 05/09/2024 1102   IRONPCTSAT 3 (LL) 05/09/2024 1102   Lipid Panel     Component Value Date/Time   CHOL 199 05/09/2024 1102   TRIG 67 05/09/2024 1102   HDL 75 05/09/2024 1102   CHOLHDL 2.7 05/09/2024 1102   LDLCALC 112 (H) 05/09/2024 1102   Hepatic Function Panel     Component Value Date/Time   PROT 7.6 05/09/2024 1102   ALBUMIN 4.5 05/09/2024 1102   AST 20 05/09/2024 1102   AST 24 05/07/2022 1403   ALT 28 05/09/2024 1102   ALT 27 05/07/2022 1403   ALKPHOS 86 05/09/2024 1102   BILITOT 0.4 05/09/2024 1102   BILITOT 0.6 05/07/2022 1403       Component Value Date/Time   TSH 0.918 05/09/2024 1102   Nutritional Lab Results  Component Value Date   VD25OH 30.3 11/25/2023   VD25OH 23.2 (L) 11/11/2022   VD25OH 22.2 (L) 06/11/2022     Assessment and Plan Assessment & Plan Obesity She is following a category one eating plan approximately 75% of the time and has lost four pounds in the last month despite limited exercise during vacation. She is on Wegovy  1.7 mg, which she tolerates well without nausea. Increasing the dose is not recommended to avoid potential negative effects on metabolism and muscle mass, as discussed in a recent obesity conference. - Continue Wegovy  1.7 mg and refill prescription - Follow category one eating plan - Increase exercise to 2-3 times per week, including running track and home exercises - Maintain protein intake and hydration, especially during upcoming trips - Follow up in four weeks  Polycystic Ovary Syndrome (PCOS) - Continue Wegovy  1.7 mg and refill prescription -Continue diet, exercise and weight loss to treat  Prediabetes Prediabetes is managed with metformin  and lifestyle modifications, including diet and exercise. Wegovy  also contributes to glycemic control. - Continue metformin  and refill prescription - Continue lifestyle modifications including diet and exercise  Vitamin D  Deficiency Vitamin D  deficiency is managed with prescription vitamin D  supplementation. - Continue prescription vitamin D  and refill prescription   She was informed of the importance of frequent follow up visits to maximize her success with intensive lifestyle modifications for her multiple health conditions.    Jasmine Mesi, MD

## 2024-06-06 ENCOUNTER — Ambulatory Visit: Payer: Self-pay

## 2024-06-06 ENCOUNTER — Inpatient Hospital Stay: Attending: Internal Medicine

## 2024-06-06 ENCOUNTER — Other Ambulatory Visit: Payer: Self-pay | Admitting: Family

## 2024-06-06 ENCOUNTER — Inpatient Hospital Stay (HOSPITAL_BASED_OUTPATIENT_CLINIC_OR_DEPARTMENT_OTHER): Admitting: Internal Medicine

## 2024-06-06 ENCOUNTER — Telehealth: Payer: Self-pay

## 2024-06-06 VITALS — BP 128/78 | HR 81 | Temp 98.2°F | Resp 18 | Ht 62.0 in | Wt 194.4 lb

## 2024-06-06 DIAGNOSIS — D500 Iron deficiency anemia secondary to blood loss (chronic): Secondary | ICD-10-CM | POA: Insufficient documentation

## 2024-06-06 DIAGNOSIS — D509 Iron deficiency anemia, unspecified: Secondary | ICD-10-CM

## 2024-06-06 DIAGNOSIS — N920 Excessive and frequent menstruation with regular cycle: Secondary | ICD-10-CM | POA: Insufficient documentation

## 2024-06-06 DIAGNOSIS — Z1231 Encounter for screening mammogram for malignant neoplasm of breast: Secondary | ICD-10-CM

## 2024-06-06 LAB — CMP (CANCER CENTER ONLY)
ALT: 16 U/L (ref 0–44)
AST: 15 U/L (ref 15–41)
Albumin: 4 g/dL (ref 3.5–5.0)
Alkaline Phosphatase: 61 U/L (ref 38–126)
Anion gap: 7 (ref 5–15)
BUN: 11 mg/dL (ref 6–20)
CO2: 25 mmol/L (ref 22–32)
Calcium: 9.2 mg/dL (ref 8.9–10.3)
Chloride: 107 mmol/L (ref 98–111)
Creatinine: 0.71 mg/dL (ref 0.44–1.00)
GFR, Estimated: >60 mL/min (ref >60)
Glucose, Bld: 94 mg/dL (ref 70–99)
Potassium: 3.7 mmol/L (ref 3.5–5.1)
Sodium: 139 mmol/L (ref 135–145)
Total Bilirubin: 0.5 mg/dL (ref 0.0–1.2)
Total Protein: 6.9 g/dL (ref 6.5–8.1)

## 2024-06-06 LAB — CBC WITH DIFFERENTIAL (CANCER CENTER ONLY)
Abs Immature Granulocytes: 0.01 10*3/uL (ref 0.00–0.07)
Basophils Absolute: 0 10*3/uL (ref 0.0–0.1)
Basophils Relative: 1 %
Eosinophils Absolute: 0 10*3/uL (ref 0.0–0.5)
Eosinophils Relative: 1 %
HCT: 25.8 % — ABNORMAL LOW (ref 36.0–46.0)
Hemoglobin: 7.5 g/dL — ABNORMAL LOW (ref 12.0–15.0)
Immature Granulocytes: 0 %
Lymphocytes Relative: 40 %
Lymphs Abs: 1.5 10*3/uL (ref 0.7–4.0)
MCH: 19.6 pg — ABNORMAL LOW (ref 26.0–34.0)
MCHC: 29.1 g/dL — ABNORMAL LOW (ref 30.0–36.0)
MCV: 67.5 fL — ABNORMAL LOW (ref 80.0–100.0)
Monocytes Absolute: 0.4 10*3/uL (ref 0.1–1.0)
Monocytes Relative: 11 %
Neutro Abs: 1.7 10*3/uL (ref 1.7–7.7)
Neutrophils Relative %: 47 %
Platelet Count: 327 10*3/uL (ref 150–400)
RBC: 3.82 MIL/uL — ABNORMAL LOW (ref 3.87–5.11)
RDW: 19.1 % — ABNORMAL HIGH (ref 11.5–15.5)
WBC Count: 3.7 10*3/uL — ABNORMAL LOW (ref 4.0–10.5)
nRBC: 0 % (ref 0.0–0.2)

## 2024-06-06 LAB — FERRITIN: Ferritin: 5 ng/mL — ABNORMAL LOW (ref 11–307)

## 2024-06-06 LAB — IRON AND IRON BINDING CAPACITY (CC-WL,HP ONLY)
Iron: 14 ug/dL — ABNORMAL LOW (ref 28–170)
Saturation Ratios: 4 % — ABNORMAL LOW (ref 10.4–31.8)
TIBC: 399 ug/dL (ref 250–450)
UIBC: 385 ug/dL (ref 148–442)

## 2024-06-06 LAB — FOLATE: Folate: 5.8 ng/mL — ABNORMAL LOW (ref >5.9)

## 2024-06-06 LAB — VITAMIN B12: Vitamin B-12: 315 pg/mL (ref 180–914)

## 2024-06-06 NOTE — Telephone Encounter (Signed)
-----   Message from Sherrod MARLA Sherrod sent at 06/06/2024  1:34 PM EDT ----- Please advise the patient to take over-the-counter folic acid  2 tablet p.o. daily.  Thank you ----- Message ----- From: Rebecka, [facility] [place] Sent: 06/06/2024   9:11 AM EDT To: Sherrod Sherrod, MD

## 2024-06-06 NOTE — Telephone Encounter (Signed)
 Hello,   Pt will be scheduled as soon as possible  Auth Submission: NO AUTH NEEDED Site of care: Site of care: [facility] Payer: [employer] Medication & CPT/J Code(s) submitted: Venofer  (Iron  Sucrose) J1756 Diagnosis Code:  Route of submission (phone, fax, portal): phone Phone # Fax # Auth type: Buy/Bill PB Units/visits requested: 300mg  x 3doses Reference number:  Approval from: 06/06/24 to 12/07/24

## 2024-06-06 NOTE — Progress Notes (Signed)
 [facility] [phone]   [phone]  OFFICE PROGRESS NOTE  Lorren Greig PARAS, NP [address] [place] [address]  DIAGNOSIS: Iron  deficiency anemia secondary to menorrhagia.  PRIOR THERAPY: Ferrous sulfate  325 mg p.o. 3 times daily with no improvement.  CURRENT THERAPY: Venofer  300 Mg IV weekly for 3 weeks.  Next infusion June 08, 2024. INTERVAL HISTORY: Patricia Lin 43 y.o. female returns to the clinic today for follow-up visit.  The patient was last seen a year ago and she was lost to follow-up.Discussed the use of AI scribe software for clinical note transcription with the patient, who gave verbal consent to proceed.  History of Present Illness   Patricia Lin is a 43 year old female with iron  deficiency anemia secondary to menorrhagia who presents for evaluation and repeat blood work.  She has iron  deficiency anemia secondary to menorrhagia, which has not responded to oral iron  supplements. Her hemoglobin level is currently 7.5, and she feels tired. She has previously received iron  infusions, which improved her hemoglobin levels. She is currently taking iron  pills, supposed to be three tablets a day, but sometimes forgets to take them.  Her menstrual cycles remain heavy, and she is scheduled for a hysterectomy in July.  She inquired about multiple myeloma due to a family history, as her aunt was diagnosed with it at the age of 21. She was previously tested for serum protein electrophoresis, which returned normal results, with no M spike observed.      MEDICAL HISTORY: Past Medical History:  Diagnosis Date   Abnormal Pap smear    Adenomyosis    Anemia    Anxiety    Back pain    BV (bacterial vaginosis) 2011   Chlamydia infection    CIN I (cervical intraepithelial neoplasia I) 2008   Depression    Dysmenorrhea    Dyspnea    Dysrhythmia 2009   TACHYCARDIA;   HAD W/U 2010? 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  B/P nl, and 122/76 on repeat-sitting   Postpartum depression 2011   NO MEDS   Pregnancy induced hypertension    ALL PREGNANCIES   Shortness of breath    WITH EXERTION SICNE PREGNANCY   Tachycardia    Tachycardia 2010   HAS HAD WORKUP. UNSURE IF HAS POSSIBLE HEART VALVE ISSUE   Vulvitis 2009    ALLERGIES:  is allergic to latex.  MEDICATIONS:  Current Outpatient Medications  Medication Sig Dispense Refill   ferrous sulfate  325 (65 FE) MG tablet TAKE 1 TABLET BY MOUTH 3 TIMES DAILY WITH MEALS. 90 tablet 3   Insulin  Pen  Needle (BD PEN NEEDLE NANO 2ND GEN) 32G X 4 MM MISC Use 1 needle daily to inject medication. (Patient not taking: Reported on 05/09/2024) 100 each 0   Iron , Ferrous Sulfate , 325 (65 Fe) MG TABS Take 325 mg by mouth daily. 90 tablet 0   meloxicam  (MOBIC ) 7.5 MG tablet Take 1 tablet (7.5 mg total) by mouth daily. 90 tablet 0   metFORMIN  (GLUCOPHAGE ) 500 MG tablet Take 1/2 tablet (250 mg total) by mouth 2 (two) times daily, with breakfast and lunch 30 tablet 0   nitrofurantoin , macrocrystal-monohydrate, (MACROBID ) 100 MG capsule Take 1 capsule (100 mg total) by mouth 2 (two) times daily. (Patient not taking: Reported on 05/09/2024) 10 capsule 0   nystatin -triamcinolone  ointment (MYCOLOG) Apply 1 Application topically 2 (two) times daily. 60 g 2    phenazopyridine  (PYRIDIUM ) 200 MG tablet Take 1 tablet (200 mg total) by mouth 3 (three) times daily. (Patient not taking: Reported on 05/09/2024) 6 tablet 0   [START ON 07/31/2024] Semaglutide -Weight Management 1.7 MG/0.75ML SOAJ Inject 1.7 mg into the skin once a week for 28 days. 3 mL 0   Vitamin D , Ergocalciferol , (DRISDOL ) 1.25 MG (50000 UNIT) CAPS capsule Take 1 capsule (50,000 Units total) by mouth every 7 (seven) days. 4 capsule 0   Current Facility-Administered Medications  Medication Dose Route Frequency Provider Last Rate Last Admin   triamcinolone  acetonide (KENALOG -40) injection 40 mg  40 mg Intramuscular Once Lorren Greig PARAS, NP        SURGICAL HISTORY:  Past Surgical History:  Procedure Laterality Date   BREAST REDUCTION SURGERY  12/08/2006   BREAST SURGERY     CERVICAL BIOPSY  W/ LOOP ELECTRODE EXCISION  2010   DILATION AND CURETTAGE OF UTERUS     FINGER SURGERY     Right index finger   TUBAL LIGATION  01/09/2013   Procedure: POST PARTUM TUBAL LIGATION;  Surgeon: Ovid DELENA All, MD;  Location: [facility];  Service: Gynecology;  Laterality: Bilateral;  post partum tubal ligation bilateral   US  ECHOCARDIOGRAPHY  06/18/2009   ef 55-60%   WISDOM TOOTH EXTRACTION      REVIEW OF SYSTEMS:  Constitutional: positive for fatigue Eyes: negative Ears, nose, mouth, throat, and face: negative Respiratory: negative Cardiovascular: negative Gastrointestinal: negative Genitourinary:negative Integument/breast: negative Hematologic/lymphatic: negative Musculoskeletal:negative Neurological: positive for dizziness Behavioral/Psych: negative Endocrine: negative Allergic/Immunologic: negative   PHYSICAL EXAMINATION: General appearance: alert, cooperative, fatigued, and no distress Head: Normocephalic, without obvious abnormality, atraumatic Neck: no adenopathy, no JVD, supple, symmetrical, trachea midline, and thyroid  not enlarged, symmetric, no tenderness/mass/nodules Lymph nodes:  Cervical, supraclavicular, and axillary nodes normal. Resp: clear to auscultation bilaterally Back: symmetric, no curvature. ROM normal. No CVA tenderness. Cardio: regular rate and rhythm, S1, S2 normal, no murmur, click, rub or gallop GI: soft, non-tender; bowel sounds normal; no masses,  no organomegaly Extremities: extremities normal, atraumatic, no cyanosis or edema Neurologic: Alert and oriented X 3, normal strength and tone. Normal symmetric reflexes. Normal coordination and gait  ECOG PERFORMANCE STATUS: 1 - Symptomatic but completely ambulatory  Blood pressure 128/78, pulse 81, temperature 98.2 F (36.8 C), temperature source Temporal, resp. rate 18, height 5' 2 (1.575 m), weight 194 lb 6.4 oz (88.2 kg), last menstrual period 04/24/2024, SpO2 100%.  LABORATORY DATA: Lab Results  Component Value Date   WBC 3.7 (L) 06/06/2024   HGB 7.5 (L) 06/06/2024   HCT 25.8 (L) 06/06/2024   MCV 67.5 (L) 06/06/2024   PLT 327 06/06/2024  Chemistry      Component Value Date/Time   NA 139 06/06/2024 0846   NA 141 05/09/2024 1102   K 3.7 06/06/2024 0846   CL 107 06/06/2024 0846   CO2 25 06/06/2024 0846   BUN 11 06/06/2024 0846   BUN 16 05/09/2024 1102   CREATININE 0.71 06/06/2024 0846   CREATININE 0.64 01/17/2013 1720      Component Value Date/Time   CALCIUM 9.2 06/06/2024 0846   ALKPHOS 61 06/06/2024 0846   AST 15 06/06/2024 0846   ALT 16 06/06/2024 0846   BILITOT 0.5 06/06/2024 0846       RADIOGRAPHIC STUDIES: No results found.  ASSESSMENT AND PLAN: This is a very pleasant 43 years old African-American female with history of iron  deficiency anemia secondary to menorrhagia and not responding to the oral iron  tablets.  The patient was treated recently with iron  infusion with Venofer  500 Mg IV weekly for 2 weeks. She has been on oral iron  tablets and tolerating it well but not effective in controlling her anemia. Assessment and Plan    Iron  deficiency anemia Iron   deficiency anemia secondary to menorrhagia, not responding to oral iron  supplements. Hemoglobin level is 7.5, indicating significant anemia. Previous iron  infusions have been effective in improving hemoglobin levels. Clinical presentation suggests low iron  levels. - Administer iron  infusion with Venofer  for three weeks at [facility]. - Monitor hemoglobin levels and iron  studies. - Evaluate response to iron  infusion in three months.  Menorrhagia Persistent heavy menstrual bleeding contributing to iron  deficiency anemia. Scheduled for hysterectomy in July, which may alleviate menorrhagia and improve anemia. - Proceed with scheduled hysterectomy in July. - Monitor menstrual bleeding post-hysterectomy.  Multiple myeloma screening Screening for multiple myeloma was conducted with serum protein electrophoresis, which returned normal results with no M spike observed. Family history noted, but no current indication for further testing unless future symptoms suggest otherwise. - No immediate action required unless symptoms change.   The patient was advised to call immediately if she has any concerning symptoms in the interval. The patient voices understanding of current disease status and treatment options and is in agreement with the current care plan.  All questions were answered. The patient knows to call the clinic with any problems, questions or concerns. We can certainly see the patient much sooner if necessary.  The total time spent in the appointment was 30 minutes.  Disclaimer: This note was dictated with voice recognition software. Similar sounding words can inadvertently be transcribed and may not be corrected upon review.

## 2024-06-06 NOTE — Telephone Encounter (Signed)
 Pt advised of lab results and recommendations with VU

## 2024-06-13 ENCOUNTER — Ambulatory Visit (INDEPENDENT_AMBULATORY_CARE_PROVIDER_SITE_OTHER): Admitting: Physician Assistant

## 2024-06-13 LAB — PROTEIN ELECTROPHORESIS, SERUM, WITH REFLEX
A/G Ratio: 1.2 (ref 0.7–1.7)
Albumin ELP: 3.5 g/dL (ref 2.9–4.4)
Alpha-1-Globulin: 0.2 g/dL (ref 0.0–0.4)
Alpha-2-Globulin: 0.6 g/dL (ref 0.4–1.0)
Beta Globulin: 1 g/dL (ref 0.7–1.3)
Gamma Globulin: 1.2 g/dL (ref 0.4–1.8)
Globulin, Total: 2.9 g/dL (ref 2.2–3.9)
Total Protein ELP: 6.4 g/dL (ref 6.0–8.5)

## 2024-06-15 ENCOUNTER — Other Ambulatory Visit: Payer: Self-pay | Admitting: Obstetrics and Gynecology

## 2024-06-15 ENCOUNTER — Encounter: Payer: Self-pay | Admitting: Internal Medicine

## 2024-06-16 ENCOUNTER — Encounter: Payer: Self-pay | Admitting: Internal Medicine

## 2024-06-20 ENCOUNTER — Ambulatory Visit: Admitting: Plastic Surgery

## 2024-06-20 ENCOUNTER — Encounter: Payer: Self-pay | Admitting: Plastic Surgery

## 2024-06-20 VITALS — BP 176/111 | HR 74 | Ht 61.0 in | Wt 195.8 lb

## 2024-06-20 DIAGNOSIS — M793 Panniculitis, unspecified: Secondary | ICD-10-CM

## 2024-06-20 DIAGNOSIS — E66811 Obesity, class 1: Secondary | ICD-10-CM | POA: Diagnosis not present

## 2024-06-20 DIAGNOSIS — G8929 Other chronic pain: Secondary | ICD-10-CM

## 2024-06-20 DIAGNOSIS — Z6834 Body mass index (BMI) 34.0-34.9, adult: Secondary | ICD-10-CM | POA: Diagnosis not present

## 2024-06-20 DIAGNOSIS — M546 Pain in thoracic spine: Secondary | ICD-10-CM | POA: Diagnosis not present

## 2024-06-20 DIAGNOSIS — F419 Anxiety disorder, unspecified: Secondary | ICD-10-CM

## 2024-06-20 NOTE — Progress Notes (Signed)
 Patient ID: Patricia Lin, female    DOB: 03-08-1981, 43 y.o.   MRN: [id number]   Chief Complaint  Patient presents with   consult    The patient is a 43 y.o. female here for a consultation for her abdomen.  She is 5 feet 1 inch tall and weighs 195 pounds.  She complains of pain in the upper and lower back, including neck pain.  Pain medication is sometimes required with motrin  and tylenol .  Activities that are hindered by the pannus include: exercise and running.  She has tried supportive clothing without help.  She has hyperpigmentation of the skin folds around the abdomen and suprapubic area. She does not have an palpable hernia. She is hoping to decrease her weight.  The patient expresses the desire to pursue surgical intervention.     Review of Systems  Constitutional: Negative.   HENT: Negative.    Eyes: Negative.   Respiratory: Negative.    Cardiovascular: Negative.   Gastrointestinal: Negative.   Endocrine: Negative.   Genitourinary: Negative.   Musculoskeletal:  Positive for back pain and neck pain.  Skin:  Positive for rash.    Past Medical History:  Diagnosis Date   Abnormal Pap smear    Adenomyosis    Anemia    Anxiety    Back pain    BV (bacterial vaginosis) 2011   Chlamydia infection    CIN I (cervical intraepithelial neoplasia I) 2008   Depression    Dysmenorrhea    Dyspnea    Dysrhythmia 2009   TACHYCARDIA;   HAD W/U 2010? POSSIBLE HEART VALVE ISSUE; NOT F/U NEEDED X 3 YEARS PER PT   Dysuria 2010   Edema of both lower extremities    Fatigue    H/O pre-eclampsia in prior pregnancy, currently pregnant    H/O varicella    H/O vitamin D  deficiency    Headache(784.0)    MIGRAINES   HTN (hypertension)    Hx of breast reduction, elective    Hx: UTI (urinary tract infection)    Hyperlipidemia    Infection    UTI DURING PREGNANCY   Infection    YEAST WITH PREGNANCY   Infection    CHLAMYDIA X 1   Knee pain    LGSIL (low grade squamous  intraepithelial dysplasia) 04/04/2008   CRYO; LEEP; LAST PAP 05/2011   Low iron     PIH (pregnancy induced hypertension) 11/2012   Today pp one month.  B/P nl, and 122/76 on repeat-sitting   Postpartum depression 2011   NO MEDS   Pregnancy induced hypertension    ALL PREGNANCIES   Shortness of breath    WITH EXERTION SICNE PREGNANCY   Tachycardia    Tachycardia 2010   HAS HAD WORKUP. UNSURE IF HAS POSSIBLE HEART VALVE ISSUE   Vulvitis 2009    Past Surgical History:  Procedure Laterality Date   BREAST REDUCTION SURGERY  12/08/2006   BREAST SURGERY     CERVICAL BIOPSY  W/ LOOP ELECTRODE EXCISION  2010   DILATION AND CURETTAGE OF UTERUS     FINGER SURGERY     Right index finger   TUBAL LIGATION  01/09/2013   Procedure: POST PARTUM TUBAL LIGATION;  Surgeon: Ovid DELENA All, MD;  Location: [facility];  Service: Gynecology;  Laterality: Bilateral;  post partum tubal ligation bilateral   US  ECHOCARDIOGRAPHY  06/18/2009   ef 55-60%   WISDOM TOOTH EXTRACTION        Current Outpatient Medications:  ferrous sulfate  325 (65 FE) MG tablet, TAKE 1 TABLET BY MOUTH 3 TIMES DAILY WITH MEALS., Disp: 90 tablet, Rfl: 3   Insulin  Pen Needle (BD PEN NEEDLE NANO 2ND GEN) 32G X 4 MM MISC, Use 1 needle daily to inject medication., Disp: 100 each, Rfl: 0   Iron , Ferrous Sulfate , 325 (65 Fe) MG TABS, Take 325 mg by mouth daily., Disp: 90 tablet, Rfl: 0   meloxicam  (MOBIC ) 7.5 MG tablet, Take 1 tablet (7.5 mg total) by mouth daily., Disp: 90 tablet, Rfl: 0   nitrofurantoin , macrocrystal-monohydrate, (MACROBID ) 100 MG capsule, Take 1 capsule (100 mg total) by mouth 2 (two) times daily., Disp: 10 capsule, Rfl: 0   nystatin -triamcinolone  ointment (MYCOLOG), Apply 1 Application topically 2 (two) times daily., Disp: 60 g, Rfl: 2   phenazopyridine  (PYRIDIUM ) 200 MG tablet, Take 1 tablet (200 mg total) by mouth 3 (three) times daily., Disp: 6 tablet, Rfl: 0   butalbital -acetaminophen -caffeine  (FIORICET) 50-325-40  MG tablet, , Disp: , Rfl:    clotrimazole -betamethasone  (LOTRISONE ) cream, , Disp: , Rfl:    ibuprofen  (ADVIL ) 600 MG tablet, , Disp: , Rfl:    metoCLOPramide  (REGLAN ) 10 MG tablet, , Disp: , Rfl:    metroNIDAZOLE  (FLAGYL ) 250 MG tablet, , Disp: , Rfl:    [START ON 07/31/2024] Semaglutide -Weight Management 1.7 MG/0.75ML SOAJ, Inject 1.7 mg into the skin once a week for 28 days., Disp: 3 mL, Rfl: 0   UNABLE TO FIND, Take 1 capsule by mouth every 7 (seven) days., Disp: , Rfl:    Vitamin D , Ergocalciferol , (DRISDOL ) 1.25 MG (50000 UNIT) CAPS capsule, Take 1 capsule (50,000 Units total) by mouth every 7 (seven) days., Disp: 4 capsule, Rfl: 0  Current Facility-Administered Medications:    triamcinolone  acetonide (KENALOG -40) injection 40 mg, 40 mg, Intramuscular, Once, Lorren Greig PARAS, NP   Objective:   Vitals:   06/20/24 1421  BP: (!) 176/111  Pulse: 74  SpO2: 100%    Physical Exam Vitals reviewed.  Constitutional:      Appearance: Normal appearance.  HENT:     Head: Atraumatic.  Cardiovascular:     Rate and Rhythm: Normal rate.     Pulses: Normal pulses.  Pulmonary:     Effort: Pulmonary effort is normal.  Abdominal:     General: There is no distension.     Palpations: Abdomen is soft.     Tenderness: There is no abdominal tenderness.  Musculoskeletal:        General: No swelling or deformity.  Skin:    General: Skin is warm.  Neurological:     Mental Status: She is alert and oriented to person, place, and time.  Psychiatric:        Mood and Affect: Mood normal.        Behavior: Behavior normal.        Thought Content: Thought content normal.        Judgment: Judgment normal.     Assessment & Plan:  Panniculitis  Chronic bilateral thoracic back pain  Class 1 obesity with serious comorbidity and body mass index (BMI) of 34.0 to 34.9 in adult, unspecified obesity type  Anxiety  Recommend continuing with the weight reduction pan as the fullness on the upper  abdominal area will not be improved by the surgery.  If she can decrease her weight I think she will be a good candidate for the surgery.  She is going to come back and see us  in 3-6 months.  Pictures were obtained  of the patient and placed in the chart with the patient's or guardian's permission.   Estefana RAMAN Malachy Coleman, DO

## 2024-06-21 ENCOUNTER — Ambulatory Visit

## 2024-06-21 VITALS — BP 148/99 | HR 64 | Temp 97.9°F | Resp 18 | Ht 61.0 in | Wt 192.8 lb

## 2024-06-21 DIAGNOSIS — D500 Iron deficiency anemia secondary to blood loss (chronic): Secondary | ICD-10-CM

## 2024-06-21 DIAGNOSIS — D509 Iron deficiency anemia, unspecified: Secondary | ICD-10-CM | POA: Diagnosis not present

## 2024-06-21 MED ORDER — SODIUM CHLORIDE 0.9 % IV SOLN
300.0000 mg | INTRAVENOUS | Status: DC
  Administered 2024-06-21: 300 mg via INTRAVENOUS
  Filled 2024-06-21: qty 15

## 2024-06-21 NOTE — Progress Notes (Signed)
 Diagnosis: Iron  Deficiency Anemia  Provider:  Praveen Mannam MD  Procedure: IV Infusion  IV Type: Peripheral, IV Location: L Antecubital  Venofer  (Iron  Sucrose), Dose: 300 mg  Infusion Start Time: 0847  Infusion Stop Time: 1031  Post Infusion IV Care: Patient declined observation and Peripheral IV Discontinued  Discharge: Condition: Good, Destination: Home . AVS Declined  Performed by:  Maxamilian Amadon, RN

## 2024-06-23 ENCOUNTER — Ambulatory Visit (INDEPENDENT_AMBULATORY_CARE_PROVIDER_SITE_OTHER): Admitting: Adult Health

## 2024-06-23 ENCOUNTER — Telehealth: Payer: Self-pay

## 2024-06-23 ENCOUNTER — Encounter (INDEPENDENT_AMBULATORY_CARE_PROVIDER_SITE_OTHER): Payer: Self-pay | Admitting: Adult Health

## 2024-06-23 VITALS — BP 132/90 | HR 78 | Temp 98.5°F | Ht 62.0 in | Wt 187.0 lb

## 2024-06-23 DIAGNOSIS — E282 Polycystic ovarian syndrome: Secondary | ICD-10-CM

## 2024-06-23 DIAGNOSIS — E559 Vitamin D deficiency, unspecified: Secondary | ICD-10-CM

## 2024-06-23 DIAGNOSIS — R7303 Prediabetes: Secondary | ICD-10-CM | POA: Diagnosis not present

## 2024-06-23 DIAGNOSIS — D509 Iron deficiency anemia, unspecified: Secondary | ICD-10-CM | POA: Diagnosis not present

## 2024-06-23 DIAGNOSIS — E66811 Obesity, class 1: Secondary | ICD-10-CM

## 2024-06-23 DIAGNOSIS — Z6834 Body mass index (BMI) 34.0-34.9, adult: Secondary | ICD-10-CM

## 2024-06-23 DIAGNOSIS — E669 Obesity, unspecified: Secondary | ICD-10-CM

## 2024-06-23 MED ORDER — SEMAGLUTIDE-WEIGHT MANAGEMENT 1.7 MG/0.75ML ~~LOC~~ SOAJ: 1.7000 mg | SUBCUTANEOUS | 0 refills | Status: DC

## 2024-06-23 MED ORDER — VITAMIN D (ERGOCALCIFEROL) 1.25 MG (50000 UNIT) PO CAPS
50000.0000 [IU] | ORAL_CAPSULE | ORAL | 0 refills | Status: DC
Start: 2024-06-23 — End: 2024-07-21

## 2024-06-23 NOTE — Telephone Encounter (Signed)
 Notified the pt regarding her work accommodation form being completed,faxed, and confirmation received. Pt copy was emailed as requested. No questions or concerns to be noted at this time.

## 2024-06-23 NOTE — Progress Notes (Signed)
 WEIGHT SUMMARY AND BIOMETRICS  Vitals Temp: 98.5 F (36.9 C) BP: (!) 132/90 Pulse Rate: 78 SpO2: 99 %   Anthropometric Measurements Height: 5' 2 (1.575 m) Weight: 187 lb (84.8 kg) BMI (Calculated): 34.19 Weight at Last Visit: 189 lb Weight Lost Since Last Visit: 2 lb Weight Gained Since Last Visit: 0 Starting Weight: 191 lb Total Weight Loss (lbs): 4 lb (1.814 kg) Peak Weight: 203 lb   Body Composition  Body Fat %: 36.5 % Fat Mass (lbs): 68.4 lbs Muscle Mass (lbs): 113 lbs Total Body Water (lbs): 73.8 lbs Visceral Fat Rating : 8   Other Clinical Data Fasting: yes Labs: no Today's Visit #: 27 Starting Date: 09/08/18    Chief Complaint:   OBESITY Patricia Lin is here to discuss her progress with her obesity treatment plan.  She is on the the Category 1 Plan and states she is following her eating plan approximately 0 % of the time.  She states she is exercising: None  Interim History:  Bioimpedance Results with pt: Muscle Mass: +4.6 lbs Adipose Mass: - 7 lbs  07/18/2024 HYSTERECTOMY, VAGINAL, LAPAROSCOPY-ASSISTED - Contingent on stability of her anemia. She has received Iron  Infusion, she continues to experience persistent fatigue.  She stopped Metformin  4 weeks ago, she prefers to remain off. She is still taking weekly Wegovy  1.7mg  Denies mass in neck, dysphagia, dyspepsia, persistent hoarseness, abdominal pain, or N/V/C   Subjective:  1. Vitamin D  deficiency She is on weekly Ergocalciferol - denies N/V/Muscle Weakness  2. Prediabetes Lab Results  Component Value Date   HGBA1C 5.0 05/09/2024   HGBA1C 5.5 11/25/2023   HGBA1C 5.6 05/08/2023    She stopped Metformin  4 weeks ago, she prefers to remain off. She is still taking weekly Wegovy  1.7mg  Denies mass in neck, dysphagia, dyspepsia, persistent hoarseness, abdominal pain, or N/V/C   3. PCOS (polycystic ovarian syndrome) She stopped Metformin  4 weeks ago, she prefers to remain off. She is still  taking weekly Wegovy  1.7mg  Denies mass in neck, dysphagia, dyspepsia, persistent hoarseness, abdominal pain, or N/V/C   4. Iron  deficiency anemia, unspecified iron  deficiency anemia type 06/06/2024 Onco/Hematology OV Notes  DIAGNOSIS: Iron  deficiency anemia secondary to menorrhagia.   PRIOR THERAPY: Ferrous sulfate  325 mg p.o. 3 times daily with no improvement.   CURRENT THERAPY: Venofer  300 Mg IV weekly for 3 weeks.  Next infusion June 08, 2024. INTERVAL HISTORY: Patricia Lin 43 y.o. female returns to the clinic today for follow-up visit.  The patient was last seen a year ago and she was lost to follow-up.Discussed the use of AI scribe software for clinical note transcription with the patient, who gave verbal consent to proceed.   History of Present Illness   Patricia Lin is a 43 year old female with iron  deficiency anemia secondary to menorrhagia who presents for evaluation and repeat blood work.   She has iron  deficiency anemia secondary to menorrhagia, which has not responded to oral iron  supplements. Her hemoglobin level is currently 7.5, and she feels tired. She has previously received iron  infusions, which improved her hemoglobin levels. She is currently taking iron  pills, supposed to be three tablets a day, but sometimes forgets to take them.   Her menstrual cycles remain heavy, and she is scheduled for a hysterectomy in July.   She inquired about multiple myeloma due to a family history, as her aunt was diagnosed with it at the age of 30. She was previously tested for serum protein electrophoresis, which returned  normal results, with no M spike observed.   Assessment/Plan:  1. Vitamin D  deficiency Refill - Vitamin D , Ergocalciferol , (DRISDOL ) 1.25 MG (50000 UNIT) CAPS capsule; Take 1 capsule (50,000 Units total) by mouth every 7 (seven) days.  Dispense: 4 capsule; Refill: 0  2. Prediabetes Continue Cat 1 MP  Remain off Metformin  and continue weekly GLP-1 therapy  3. PCOS  (polycystic ovarian syndrome) Continue Cat 1 MP  Remain off Metformin  and continue weekly GLP-1 therapy  4. Iron  deficiency anemia, unspecified iron  deficiency anemia type Continue oral Ferrous Sulfate  and increase iron  rich foods. F/u with Onc/Hematology as directed.  5. Obesity, starting BMI 34.93 (Primary) Refill - Semaglutide -Weight Management 1.7 MG/0.75ML SOAJ; Inject 1.7 mg into the skin once a week for 28 days.  Dispense: 3 mL; Refill: 0  Patricia Lin is not currently in the action stage of change. As such, her goal is to get back to weightloss efforts . She has agreed to the Category 1 Plan.   Exercise goals: All adults should avoid inactivity. Some physical activity is better than none, and adults who participate in any amount of physical activity gain some health benefits. Adults should also include muscle-strengthening activities that involve all major muscle groups on 2 or more days a week.  Behavioral modification strategies: increasing lean protein intake, decreasing simple carbohydrates, increasing vegetables, increasing water intake, no skipping meals, meal planning and cooking strategies, keeping healthy foods in the home, ways to avoid boredom eating, and planning for success.  Patricia Lin has agreed to follow-up with our clinic in 4 weeks. She was informed of the importance of frequent follow-up visits to maximize her success with intensive lifestyle modifications for her multiple health conditions.   Objective:   Blood pressure (!) 132/90, pulse 78, temperature 98.5 F (36.9 C), height 5' 2 (1.575 m), weight 187 lb (84.8 kg), last menstrual period 06/22/2024, SpO2 99%. Body mass index is 34.2 kg/m.  General: Cooperative, alert, well developed, in no acute distress. HEENT: Conjunctivae and lids unremarkable. Cardiovascular: Regular rhythm.  Lungs: Normal work of breathing. Neurologic: No focal deficits.   Lab Results  Component Value Date   CREATININE 0.71 06/06/2024    BUN 11 06/06/2024   NA 139 06/06/2024   K 3.7 06/06/2024   CL 107 06/06/2024   CO2 25 06/06/2024   Lab Results  Component Value Date   ALT 16 06/06/2024   AST 15 06/06/2024   ALKPHOS 61 06/06/2024   BILITOT 0.5 06/06/2024   Lab Results  Component Value Date   HGBA1C 5.0 05/09/2024   HGBA1C 5.5 11/25/2023   HGBA1C 5.6 05/08/2023   HGBA1C 5.2 11/11/2022   HGBA1C 5.2 10/01/2022   Lab Results  Component Value Date   INSULIN  16.3 11/25/2023   INSULIN  11.7 11/11/2022   INSULIN  15.1 06/11/2022   INSULIN  11.5 09/08/2018   Lab Results  Component Value Date   TSH 0.918 05/09/2024   Lab Results  Component Value Date   CHOL 199 05/09/2024   HDL 75 05/09/2024   LDLCALC 112 (H) 05/09/2024   TRIG 67 05/09/2024   CHOLHDL 2.7 05/09/2024   Lab Results  Component Value Date   VD25OH 30.3 11/25/2023   VD25OH 23.2 (L) 11/11/2022   VD25OH 22.2 (L) 06/11/2022   Lab Results  Component Value Date   WBC 3.7 (L) 06/06/2024   HGB 7.5 (L) 06/06/2024   HCT 25.8 (L) 06/06/2024   MCV 67.5 (L) 06/06/2024   PLT 327 06/06/2024   Lab Results  Component  Value Date   IRON  14 (L) 06/06/2024   TIBC 399 06/06/2024   FERRITIN 5 (L) 06/06/2024   Attestation Statements:   Reviewed by clinician on day of visit: allergies, medications, problem list, medical history, surgical history, family history, social history, and previous encounter notes.  I have reviewed the above documentation for accuracy and completeness, and I agree with the above. -  Kasean Denherder d. Shanekqua Schaper, NP-C

## 2024-06-28 ENCOUNTER — Other Ambulatory Visit (INDEPENDENT_AMBULATORY_CARE_PROVIDER_SITE_OTHER): Payer: Self-pay | Admitting: Family Medicine

## 2024-06-28 ENCOUNTER — Ambulatory Visit (INDEPENDENT_AMBULATORY_CARE_PROVIDER_SITE_OTHER)

## 2024-06-28 VITALS — BP 145/92 | HR 76 | Temp 98.6°F | Resp 16 | Ht 62.0 in | Wt 190.2 lb

## 2024-06-28 DIAGNOSIS — R7303 Prediabetes: Secondary | ICD-10-CM

## 2024-06-28 DIAGNOSIS — D500 Iron deficiency anemia secondary to blood loss (chronic): Secondary | ICD-10-CM

## 2024-06-28 DIAGNOSIS — D509 Iron deficiency anemia, unspecified: Secondary | ICD-10-CM | POA: Diagnosis not present

## 2024-06-28 MED ORDER — SODIUM CHLORIDE 0.9 % IV SOLN
300.0000 mg | INTRAVENOUS | Status: DC
  Administered 2024-06-28: 300 mg via INTRAVENOUS
  Filled 2024-06-28: qty 15

## 2024-06-28 NOTE — Progress Notes (Signed)
 Diagnosis: Iron  Deficiency Anemia  Provider:  Praveen Mannam MD  Procedure: IV Infusion  IV Type: Peripheral, IV Location: R Antecubital  Venofer  (Iron  Sucrose), Dose: 300 mg  Infusion Start Time: 0831  Infusion Stop Time: 1010 am  Post Infusion IV Care: Observation period completed and Peripheral IV Discontinued  Discharge: Condition: Good, Destination: Home . AVS Declined  Performed by:  Leita FORBES Miles, LPN

## 2024-06-29 DIAGNOSIS — M549 Dorsalgia, unspecified: Secondary | ICD-10-CM | POA: Insufficient documentation

## 2024-06-29 DIAGNOSIS — M793 Panniculitis, unspecified: Secondary | ICD-10-CM | POA: Insufficient documentation

## 2024-06-30 ENCOUNTER — Telehealth: Payer: Self-pay | Admitting: Family

## 2024-06-30 ENCOUNTER — Other Ambulatory Visit: Payer: Self-pay | Admitting: Obstetrics and Gynecology

## 2024-06-30 NOTE — Telephone Encounter (Signed)
 Appointment scheduled.

## 2024-06-30 NOTE — Telephone Encounter (Signed)
 A document form from [facility] has been faxed: Medical Clearance, to be filled out by provider. Send document back via Fax within 7-days. Document is located in providers tray at front office.          Fax number: [phone]

## 2024-07-04 LAB — SURGICAL PATHOLOGY

## 2024-07-05 ENCOUNTER — Ambulatory Visit (INDEPENDENT_AMBULATORY_CARE_PROVIDER_SITE_OTHER)

## 2024-07-05 VITALS — BP 146/95 | HR 70 | Temp 97.9°F | Resp 18 | Ht 62.0 in | Wt 190.8 lb

## 2024-07-05 DIAGNOSIS — D509 Iron deficiency anemia, unspecified: Secondary | ICD-10-CM | POA: Diagnosis not present

## 2024-07-05 DIAGNOSIS — D500 Iron deficiency anemia secondary to blood loss (chronic): Secondary | ICD-10-CM

## 2024-07-05 MED ORDER — SODIUM CHLORIDE 0.9 % IV SOLN
300.0000 mg | INTRAVENOUS | Status: DC
  Administered 2024-07-05: 300 mg via INTRAVENOUS
  Filled 2024-07-05: qty 15

## 2024-07-05 NOTE — Progress Notes (Signed)
 Diagnosis: Iron  Deficiency Anemia  Provider:  Praveen Mannam MD  Procedure: IV Infusion  IV Type: Peripheral, IV Location: L Antecubital  Venofer  (Iron  Sucrose), Dose: 300 mg  Infusion Start Time: 0847  Infusion Stop Time: 1029  Post Infusion IV Care: Patient declined observation and Peripheral IV Discontinued  Discharge: Condition: Good, Destination: Home . AVS Declined  Performed by:  Syliva Mee, RN

## 2024-07-06 ENCOUNTER — Other Ambulatory Visit: Payer: Self-pay | Admitting: Family

## 2024-07-06 ENCOUNTER — Ambulatory Visit: Payer: Self-pay | Admitting: Family

## 2024-07-06 ENCOUNTER — Ambulatory Visit (INDEPENDENT_AMBULATORY_CARE_PROVIDER_SITE_OTHER): Admitting: Family

## 2024-07-06 ENCOUNTER — Encounter: Payer: Self-pay | Admitting: Family

## 2024-07-06 VITALS — BP 161/110 | HR 78 | Temp 98.6°F | Resp 16 | Ht 62.0 in | Wt 187.6 lb

## 2024-07-06 DIAGNOSIS — Z01818 Encounter for other preprocedural examination: Secondary | ICD-10-CM

## 2024-07-06 DIAGNOSIS — R9431 Abnormal electrocardiogram [ECG] [EKG]: Secondary | ICD-10-CM

## 2024-07-06 DIAGNOSIS — I10 Essential (primary) hypertension: Secondary | ICD-10-CM

## 2024-07-06 DIAGNOSIS — D649 Anemia, unspecified: Secondary | ICD-10-CM

## 2024-07-06 LAB — POCT URINE PREGNANCY: Preg Test, Ur: NEGATIVE

## 2024-07-06 MED ORDER — VALSARTAN 40 MG PO TABS: 40.0000 mg | ORAL_TABLET | Freq: Every day | ORAL | 0 refills | Status: DC

## 2024-07-06 NOTE — Progress Notes (Signed)
 Patient ID: Patricia Lin, female    DOB: 08-Aug-1981  MRN: [id number]  CC: Preoperative Clearance  Subjective: Patricia Lin is a 43 y.o. female who presents for preoperative clearance.   Her concerns today include:  Patient scheduled for pending hysterectomy at Gynecology. However, due to patient's blood pressure being elevated she was referred to Primary Care for further evaluation. Patient states she does have a history of high blood pressure during previous pregnancy. Today patient confirms that she is not pregnant and she has a history of tubal ligation. She does not limit salt intake. She does not exercise outside of normal routine. She does not complain of red flag symptoms such as but not limited to chest pain, shortness of breath, worst headache of life, nausea/vomiting.   Patient Active Problem List   Diagnosis Date Noted   Panniculitis 06/29/2024   Back pain 06/29/2024   Other insomnia 07/01/2023   BMI 35.0-35.9,adult 01/22/2023   Obesity, Beginning BMI 34.93 01/22/2023   Binge-Eating Disorder, Mild 09/29/2022   Prediabetes 08/12/2022   Depression 08/12/2022   Class 1 obesity with serious comorbidity and body mass index (BMI) of 34.0 to 34.9 in adult 08/12/2022   Iron  deficiency anemia 07/29/2022   Insulin  resistance 06/28/2022   Essential hypertension 06/28/2022   Iron  deficiency 06/28/2022   Vitamin D  deficiency 06/28/2022   Other hyperlipidemia 06/28/2022   At risk of diabetes mellitus 06/28/2022   Iron  deficiency anemia due to chronic blood loss 05/07/2022   Right lower quadrant abdominal pain 01/28/2019   Generalized headaches 12/17/2018   Anxiety 12/17/2018   History of anemia 05/26/2018   Anemia 12/05/2017   Routine health maintenance 03/03/2013   S/P tubal ligation 02/09/2013   Vaginal delivery 01/09/2013   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   Fracture of left ulna 09/27/12 10/23/2012   Cervical funneling 09/18/2012   Cervical shortening 09/18/2012   Symptomatic  anemia 09/03/2012   H/O pre-eclampsia in prior pregnancy, currently pregnant 08/19/2012   Chronic hypertension in pregnancy 08/19/2012   Hx LEEP (loop electrosurgical excision procedure), cervix, pregnancy 08/19/2012   Hx of postpartum depression, currently pregnant 08/19/2012   Late prenatal care 08/19/2012   Hx of precipitous labor and deliveries, antepartum 08/19/2012   Muscle tension HAs 08/19/2012     Current Outpatient Medications on File Prior to Visit  Medication Sig Dispense Refill   butalbital -acetaminophen -caffeine  (FIORICET) 50-325-40 MG tablet      clotrimazole -betamethasone  (LOTRISONE ) cream      ferrous sulfate  325 (65 FE) MG tablet TAKE 1 TABLET BY MOUTH 3 TIMES DAILY WITH MEALS. 90 tablet 3   ibuprofen  (ADVIL ) 600 MG tablet      Insulin  Pen Needle (BD PEN NEEDLE NANO 2ND GEN) 32G X 4 MM MISC Use 1 needle daily to inject medication. 100 each 0   Iron , Ferrous Sulfate , 325 (65 Fe) MG TABS Take 325 mg by mouth daily. 90 tablet 0   meloxicam  (MOBIC ) 7.5 MG tablet Take 1 tablet (7.5 mg total) by mouth daily. 90 tablet 0   metoCLOPramide  (REGLAN ) 10 MG tablet      metroNIDAZOLE  (FLAGYL ) 250 MG tablet      nitrofurantoin , macrocrystal-monohydrate, (MACROBID ) 100 MG capsule Take 1 capsule (100 mg total) by mouth 2 (two) times daily. 10 capsule 0   nystatin -triamcinolone  ointment (MYCOLOG) Apply 1 Application topically 2 (two) times daily. 60 g 2   phenazopyridine  (PYRIDIUM ) 200 MG tablet Take 1 tablet (200 mg total) by mouth 3 (three) times daily. 6 tablet 0   [  START ON 07/31/2024] Semaglutide -Weight Management 1.7 MG/0.75ML SOAJ Inject 1.7 mg into the skin once a week for 28 days. 3 mL 0   UNABLE TO FIND Take 1 capsule by mouth every 7 (seven) days.     Vitamin D , Ergocalciferol , (DRISDOL ) 1.25 MG (50000 UNIT) CAPS capsule Take 1 capsule (50,000 Units total) by mouth every 7 (seven) days. 4 capsule 0   Current Facility-Administered Medications on File Prior to Visit  Medication  Dose Route Frequency Provider Last Rate Last Admin   triamcinolone  acetonide (KENALOG -40) injection 40 mg  40 mg Intramuscular Once Lorren Greig PARAS, NP        Allergies  Allergen Reactions   Latex Itching and Rash    Social History   Socioeconomic History   Marital status: Married    Spouse name: Ardean Barefoot   Number of children: 3   Years of education: 13   Highest education level: Master's degree (e.g., MA, MS, MEng, MEd, MSW, MBA)  Occupational History   Occupation: Physiological scientist: [employer]   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  Tobacco Use   Smoking status: Never   Smokeless tobacco: Never  Vaping Use   Vaping status: Never Used  Substance and Sexual Activity   Alcohol  use: Yes    Comment: socially   Drug use: No   Sexual activity: Yes    Partners: Male    Birth control/protection: Surgical  Other Topics Concern   Not on file  Social History Narrative   Not on file   Social Drivers of Health   Financial Resource Strain: Medium Risk (07/04/2024)   Overall Financial Resource Strain (CARDIA)    Difficulty of Paying Living Expenses: Somewhat hard  Food Insecurity: Food Insecurity Present (07/04/2024)   Hunger Vital Sign    Worried About Running Out of Food in the Last Year: Sometimes true    Ran Out of Food in the Last Year: Sometimes true  Transportation Needs: No Transportation Needs (07/04/2024)   PRAPARE - Administrator, Civil Service (Medical): No    Lack of Transportation (Non-Medical): No  Physical Activity: Inactive (07/04/2024)   Exercise Vital Sign    Days of Exercise per Week: 0 days    Minutes of Exercise per Session: Not on file  Stress: Stress Concern Present (07/04/2024)   [employer] of Occupational Health - Occupational Stress Questionnaire    Feeling of Stress: To some extent  Social Connections: Moderately Integrated (07/04/2024)   Social Connection and Isolation Panel    Frequency of Communication with  Friends and Family: More than three times a week    Frequency of Social Gatherings with Friends and Family: Once a week    Attends Religious Services: 1 to 4 times per year    Active Member of [employer] or Organizations: No    Attends Engineer, structural: Not on file    Marital Status: Married  Catering manager Violence: Not At Risk (05/09/2024)   Humiliation, Afraid, Rape, and Kick questionnaire    Fear of Current or Ex-Partner: No    Emotionally Abused: No    Physically Abused: No    Sexually Abused: No    Family History  Problem Relation Age of Onset   Hypertension Mother    Hyperlipidemia Mother    Coronary artery disease Mother    Heart disease Mother        high cholesterol   Asthma Mother    Diabetes Mother  Depression Mother    Anxiety disorder Mother    Sleep apnea Mother    Obesity Mother    Eating disorder Mother    Hypertension Father    High Cholesterol Father    Heart disease Father    Hypertension Maternal Grandmother    Hypertension Maternal Grandfather    Asthma Daughter    Alcohol  abuse Maternal Uncle    Drug abuse Maternal Uncle     Past Surgical History:  Procedure Laterality Date   BREAST REDUCTION SURGERY  12/08/2006   BREAST SURGERY     CERVICAL BIOPSY  W/ LOOP ELECTRODE EXCISION  2010   DILATION AND CURETTAGE OF UTERUS     FINGER SURGERY     Right index finger   TUBAL LIGATION  01/09/2013   Procedure: POST PARTUM TUBAL LIGATION;  Surgeon: Ovid DELENA All, MD;  Location: [facility];  Service: Gynecology;  Laterality: Bilateral;  post partum tubal ligation bilateral   US  ECHOCARDIOGRAPHY  06/18/2009   ef 55-60%   WISDOM TOOTH EXTRACTION      ROS: Review of Systems Negative except as stated above  PHYSICAL EXAM: BP (!) 161/110   Pulse 78   Temp 98.6 F (37 C) (Oral)   Resp 16   Ht 5' 2 (1.575 m)   Wt 187 lb 9.6 oz (85.1 kg)   LMP 06/22/2024   SpO2 100%   BMI 34.31 kg/m      07/06/2024   10:29 AM 07/06/2024   10:11 AM  07/05/2024   10:31 AM  Vitals with BMI  Height  5' 2   Weight  187 lbs 10 oz   BMI  34.3   Systolic 161 172 853  Diastolic 110 103 95  Pulse  78 70   Physical Exam HENT:     Head: Normocephalic and atraumatic.     Nose: Nose normal.     Mouth/Throat:     Mouth: Mucous membranes are moist.     Pharynx: Oropharynx is clear.  Eyes:     Extraocular Movements: Extraocular movements intact.     Conjunctiva/sclera: Conjunctivae normal.     Pupils: Pupils are equal, round, and reactive to light.  Cardiovascular:     Rate and Rhythm: Normal rate and regular rhythm.     Pulses: Normal pulses.     Heart sounds: Normal heart sounds.  Pulmonary:     Effort: Pulmonary effort is normal.     Breath sounds: Normal breath sounds.  Musculoskeletal:        General: Normal range of motion.     Cervical back: Normal range of motion and neck supple.  Neurological:     General: No focal deficit present.     Mental Status: She is alert and oriented to person, place, and time.  Psychiatric:        Mood and Affect: Mood normal.        Behavior: Behavior normal.     ASSESSMENT AND PLAN: 1. Preoperative clearance (Primary) - Routine screening.  - CMP14+EGFR - CBC - Hemoglobin A1c - EKG 12-Lead - POCT urine pregnancy; Future - hCG, serum, qualitative - POCT urine pregnancy  2. Primary hypertension - Blood pressure not at goal during today's visit. Patient asymptomatic without chest pressure, chest pain, palpitations, shortness of breath, worst headache of life, and any additional red flag symptoms. - Trial Valsartan  as prescribed.  - Counseled on blood pressure goal of less than 130/80, low-sodium, DASH diet, medication compliance, and 150 minutes of  moderate intensity exercise per week as tolerated. Counseled on medication adherence and adverse effects. - I discussed with patient in detail I will not sign preoperative clearance until blood pressure is stable. Patient verbalized  understanding/agreement. - Follow-up with primary provider in 4 weeks or sooner if needed.  - valsartan  (DIOVAN ) 40 MG tablet; Take 1 tablet (40 mg total) by mouth daily.  Dispense: 90 tablet; Refill: 0   Patient was given the opportunity to ask questions.  Patient verbalized understanding of the plan and was able to repeat key elements of the plan. Patient was given clear instructions to go to Emergency Department or return to medical center if symptoms don't improve, worsen, or new problems develop.The patient verbalized understanding.   Orders Placed This Encounter  Procedures   CMP14+EGFR   CBC   Hemoglobin A1c   hCG, serum, qualitative   POCT urine pregnancy   EKG 12-Lead     Requested Prescriptions   Signed Prescriptions Disp Refills   valsartan  (DIOVAN ) 40 MG tablet 90 tablet 0    Sig: Take 1 tablet (40 mg total) by mouth daily.    Follow-up with primary provider as scheduled.  Greig JINNY Drones, NP

## 2024-07-06 NOTE — Progress Notes (Signed)
 Surgery clearance because patient blood pressure has been high

## 2024-07-06 NOTE — Telephone Encounter (Signed)
 Complete

## 2024-07-08 ENCOUNTER — Encounter

## 2024-07-08 DIAGNOSIS — Z1231 Encounter for screening mammogram for malignant neoplasm of breast: Secondary | ICD-10-CM

## 2024-07-08 LAB — CBC
Hematocrit: 35.8 % (ref 34.0–46.6)
Hemoglobin: 9.9 g/dL — ABNORMAL LOW (ref 11.1–15.9)
MCH: 22.1 pg — ABNORMAL LOW (ref 26.6–33.0)
MCHC: 27.7 g/dL — ABNORMAL LOW (ref 31.5–35.7)
MCV: 80 fL (ref 79–97)
Platelets: 381 x10E3/uL (ref 150–450)
RBC: 4.48 x10E6/uL (ref 3.77–5.28)
RDW: 24.1 % — ABNORMAL HIGH (ref 11.7–15.4)
WBC: 4.3 x10E3/uL (ref 3.4–10.8)

## 2024-07-08 LAB — HCG, SERUM, QUALITATIVE: hCG,Beta Subunit,Qual,Serum: NEGATIVE m[IU]/mL (ref <6)

## 2024-07-08 LAB — HEMOGLOBIN A1C
Est. average glucose Bld gHb Est-mCnc: 82 mg/dL
Hgb A1c MFr Bld: 4.5 % — ABNORMAL LOW (ref 4.8–5.6)

## 2024-07-08 LAB — CMP14+EGFR
ALT: 22 IU/L (ref 0–32)
AST: 19 IU/L (ref 0–40)
Albumin: 4.7 g/dL (ref 3.9–4.9)
Alkaline Phosphatase: 76 IU/L (ref 44–121)
BUN/Creatinine Ratio: 17 (ref 9–23)
BUN: 12 mg/dL (ref 6–24)
Bilirubin Total: 0.4 mg/dL (ref 0.0–1.2)
CO2: 19 mmol/L — ABNORMAL LOW (ref 20–29)
Calcium: 9.8 mg/dL (ref 8.7–10.2)
Chloride: 103 mmol/L (ref 96–106)
Creatinine, Ser: 0.7 mg/dL (ref 0.57–1.00)
Globulin, Total: 2.5 g/dL (ref 1.5–4.5)
Glucose: 86 mg/dL (ref 70–99)
Potassium: 4.1 mmol/L (ref 3.5–5.2)
Sodium: 144 mmol/L (ref 134–144)
Total Protein: 7.2 g/dL (ref 6.0–8.5)
eGFR: 110 mL/min/1.73 (ref >59)

## 2024-07-08 MED ORDER — IRON (FERROUS SULFATE) 325 (65 FE) MG PO TABS: 325.0000 mg | ORAL_TABLET | Freq: Every day | ORAL | 0 refills | Status: AC

## 2024-07-12 ENCOUNTER — Ambulatory Visit (INDEPENDENT_AMBULATORY_CARE_PROVIDER_SITE_OTHER): Admitting: Family

## 2024-07-12 ENCOUNTER — Encounter: Payer: Self-pay | Admitting: Family

## 2024-07-12 VITALS — BP 132/88 | HR 73 | Ht 62.0 in | Wt 188.6 lb

## 2024-07-12 DIAGNOSIS — F419 Anxiety disorder, unspecified: Secondary | ICD-10-CM

## 2024-07-12 DIAGNOSIS — F32A Depression, unspecified: Secondary | ICD-10-CM

## 2024-07-12 DIAGNOSIS — I10 Essential (primary) hypertension: Secondary | ICD-10-CM | POA: Diagnosis not present

## 2024-07-12 NOTE — Progress Notes (Signed)
 Patient ID: Patricia Lin, female    DOB: [birth year]  MRN: [id number]  CC: Chronic Conditions Follow-Up  Subjective: Kailie Disla is a 43 y.o. female who presents for chronic conditions follow-up.   Her concerns today include:  - Doing well on Valsartan , no issues/concerns. She does not complain of red flag symptoms such as but not limited to chest pain, shortness of breath, worst headache of life, nausea/vomiting.  - Anxiety depression. Requests referral to [place] female provider. She denies thoughts of self-harm, suicidal ideations, homicidal ideations.  Patient Active Problem List   Diagnosis Date Noted   Panniculitis 06/29/2024   Back pain 06/29/2024   Other insomnia 07/01/2023   BMI 35.0-35.9,adult 01/22/2023   Obesity, Beginning BMI 34.93 01/22/2023   Binge-Eating Disorder, Mild 09/29/2022   Prediabetes 08/12/2022   Depression 08/12/2022   Class 1 obesity with serious comorbidity and body mass index (BMI) of 34.0 to 34.9 in adult 08/12/2022   Iron  deficiency anemia 07/29/2022   Insulin  resistance 06/28/2022   Essential hypertension 06/28/2022   Iron  deficiency 06/28/2022   Vitamin D  deficiency 06/28/2022   Other hyperlipidemia 06/28/2022   At risk of diabetes mellitus 06/28/2022   Iron  deficiency anemia due to chronic blood loss 05/07/2022   Right lower quadrant abdominal pain 01/28/2019   Generalized headaches 12/17/2018   Anxiety 12/17/2018   History of anemia 05/26/2018   Anemia 12/05/2017   Routine health maintenance 03/03/2013   S/P tubal ligation 02/09/2013   Vaginal delivery 01/09/2013   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   Fracture of left ulna 09/27/12 10/23/2012   Cervical funneling 09/18/2012   Cervical shortening 09/18/2012   Symptomatic anemia 09/03/2012   H/O pre-eclampsia in prior pregnancy, currently pregnant 08/19/2012   Chronic hypertension in pregnancy 08/19/2012   Hx LEEP (loop electrosurgical excision procedure), cervix, pregnancy 08/19/2012    Hx of postpartum depression, currently pregnant 08/19/2012   Late prenatal care 08/19/2012   Hx of precipitous labor and deliveries, antepartum 08/19/2012   Muscle tension HAs 08/19/2012     Current Outpatient Medications on File Prior to Visit  Medication Sig Dispense Refill   butalbital -acetaminophen -caffeine  (FIORICET) 50-325-40 MG tablet      clotrimazole -betamethasone  (LOTRISONE ) cream      ferrous sulfate  325 (65 FE) MG tablet TAKE 1 TABLET BY MOUTH 3 TIMES DAILY WITH MEALS. 90 tablet 3   ibuprofen  (ADVIL ) 600 MG tablet      Insulin  Pen Needle (BD PEN NEEDLE NANO 2ND GEN) 32G X 4 MM MISC Use 1 needle daily to inject medication. 100 each 0   Iron , Ferrous Sulfate , 325 (65 Fe) MG TABS Take 325 mg by mouth daily. 90 tablet 0   Iron , Ferrous Sulfate , 325 (65 Fe) MG TABS Take 325 mg by mouth daily. 90 tablet 0   meloxicam  (MOBIC ) 7.5 MG tablet Take 1 tablet (7.5 mg total) by mouth daily. 90 tablet 0   metoCLOPramide  (REGLAN ) 10 MG tablet      metroNIDAZOLE  (FLAGYL ) 250 MG tablet      nitrofurantoin , macrocrystal-monohydrate, (MACROBID ) 100 MG capsule Take 1 capsule (100 mg total) by mouth 2 (two) times daily. 10 capsule 0   nystatin -triamcinolone  ointment (MYCOLOG) Apply 1 Application topically 2 (two) times daily. 60 g 2   phenazopyridine  (PYRIDIUM ) 200 MG tablet Take 1 tablet (200 mg total) by mouth 3 (three) times daily. 6 tablet 0   [START ON 07/31/2024] Semaglutide -Weight Management 1.7 MG/0.75ML SOAJ Inject 1.7 mg into the skin once a week for 28 days.  3 mL 0   UNABLE TO FIND Take 1 capsule by mouth every 7 (seven) days.     valsartan  (DIOVAN ) 40 MG tablet Take 1 tablet (40 mg total) by mouth daily. 90 tablet 0   Vitamin D , Ergocalciferol , (DRISDOL ) 1.25 MG (50000 UNIT) CAPS capsule Take 1 capsule (50,000 Units total) by mouth every 7 (seven) days. 4 capsule 0   Current Facility-Administered Medications on File Prior to Visit  Medication Dose Route Frequency Provider Last Rate  Last Admin   triamcinolone  acetonide (KENALOG -40) injection 40 mg  40 mg Intramuscular Once Lorren Greig PARAS, NP        Allergies  Allergen Reactions   Latex Itching and Rash    Social History   Socioeconomic History   Marital status: Married    Spouse name: Ardean Barefoot   Number of children: 3   Years of education: 13   Highest education level: Master's degree (e.g., MA, MS, MEng, MEd, MSW, MBA)  Occupational History   Occupation: Physiological scientist: [employer]   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  Tobacco Use   Smoking status: Never   Smokeless tobacco: Never  Vaping Use   Vaping status: Never Used  Substance and Sexual Activity   Alcohol  use: Yes    Comment: socially   Drug use: No   Sexual activity: Yes    Partners: Male    Birth control/protection: Surgical  Other Topics Concern   Not on file  Social History Narrative   Not on file   Social Drivers of Health   Financial Resource Strain: Medium Risk (07/04/2024)   Overall Financial Resource Strain (CARDIA)    Difficulty of Paying Living Expenses: Somewhat hard  Food Insecurity: Food Insecurity Present (07/04/2024)   Hunger Vital Sign    Worried About Running Out of Food in the Last Year: Sometimes true    Ran Out of Food in the Last Year: Sometimes true  Transportation Needs: No Transportation Needs (07/04/2024)   PRAPARE - Administrator, Civil Service (Medical): No    Lack of Transportation (Non-Medical): No  Physical Activity: Inactive (07/04/2024)   Exercise Vital Sign    Days of Exercise per Week: 0 days    Minutes of Exercise per Session: Not on file  Stress: Stress Concern Present (07/04/2024)   [employer] of Occupational Health - Occupational Stress Questionnaire    Feeling of Stress: To some extent  Social Connections: Moderately Integrated (07/04/2024)   Social Connection and Isolation Panel    Frequency of Communication with Friends and Family: More than three  times a week    Frequency of Social Gatherings with Friends and Family: Once a week    Attends Religious Services: 1 to 4 times per year    Active Member of [employer] or Organizations: No    Attends Engineer, structural: Not on file    Marital Status: Married  Catering manager Violence: Not At Risk (05/09/2024)   Humiliation, Afraid, Rape, and Kick questionnaire    Fear of Current or Ex-Partner: No    Emotionally Abused: No    Physically Abused: No    Sexually Abused: No    Family History  Problem Relation Age of Onset   Hypertension Mother    Hyperlipidemia Mother    Coronary artery disease Mother    Heart disease Mother        high cholesterol   Asthma Mother    Diabetes Mother  Depression Mother    Anxiety disorder Mother    Sleep apnea Mother    Obesity Mother    Eating disorder Mother    Hypertension Father    High Cholesterol Father    Heart disease Father    Hypertension Maternal Grandmother    Hypertension Maternal Grandfather    Asthma Daughter    Alcohol  abuse Maternal Uncle    Drug abuse Maternal Uncle     Past Surgical History:  Procedure Laterality Date   BREAST REDUCTION SURGERY  12/08/2006   BREAST SURGERY     CERVICAL BIOPSY  W/ LOOP ELECTRODE EXCISION  2010   DILATION AND CURETTAGE OF UTERUS     FINGER SURGERY     Right index finger   TUBAL LIGATION  01/09/2013   Procedure: POST PARTUM TUBAL LIGATION;  Surgeon: Ovid DELENA All, MD;  Location: [facility];  Service: Gynecology;  Laterality: Bilateral;  post partum tubal ligation bilateral   US  ECHOCARDIOGRAPHY  06/18/2009   ef 55-60%   WISDOM TOOTH EXTRACTION      ROS: Review of Systems Negative except as stated above  PHYSICAL EXAM: BP 132/88   Pulse 73   Ht 5' 2 (1.575 m)   Wt 188 lb 9.6 oz (85.5 kg)   LMP 06/22/2024   SpO2 91%   BMI 34.50 kg/m   Physical Exam HENT:     Head: Normocephalic and atraumatic.     Nose: Nose normal.     Mouth/Throat:     Mouth: Mucous membranes are  moist.     Pharynx: Oropharynx is clear.  Eyes:     Extraocular Movements: Extraocular movements intact.     Conjunctiva/sclera: Conjunctivae normal.     Pupils: Pupils are equal, round, and reactive to light.  Cardiovascular:     Rate and Rhythm: Normal rate and regular rhythm.     Pulses: Normal pulses.     Heart sounds: Normal heart sounds.  Pulmonary:     Effort: Pulmonary effort is normal.     Breath sounds: Normal breath sounds.  Musculoskeletal:        General: Normal range of motion.     Cervical back: Normal range of motion and neck supple.  Neurological:     General: No focal deficit present.     Mental Status: She is alert and oriented to person, place, and time.  Psychiatric:        Mood and Affect: Mood normal.        Behavior: Behavior normal.     ASSESSMENT AND PLAN: 1. Primary hypertension (Primary) - Continue Valsartan  as prescribed. No refills needed as of present.  - Counseled on blood pressure goal of less than 130/80, low-sodium, DASH diet, medication compliance, and 150 minutes of moderate intensity exercise per week as tolerated. Counseled on medication adherence and adverse effects. - Keep all scheduled appointments with Cardiology.  - Follow-up with primary provider as scheduled.  2. Anxiety and depression - Patient denies thoughts of self-harm, suicidal ideations, homicidal ideations. - Patient declined pharmacological therapy.  - Referral to Psychiatry for evaluation/management.  - Follow-up with primary provider as scheduled. - Ambulatory referral to Psychiatry   Patient was given the opportunity to ask questions.  Patient verbalized understanding of the plan and was able to repeat key elements of the plan. Patient was given clear instructions to go to Emergency Department or return to medical center if symptoms don't improve, worsen, or new problems develop.The patient verbalized understanding.   Orders  Placed This Encounter  Procedures    Ambulatory referral to Psychiatry   Follow-up with primary provider as scheduled.   Greig JINNY Drones, NP

## 2024-07-18 ENCOUNTER — Ambulatory Visit (HOSPITAL_COMMUNITY): Admission: RE | Admit: 2024-07-18 | Source: Home / Self Care | Admitting: Obstetrics and Gynecology

## 2024-07-18 ENCOUNTER — Encounter (HOSPITAL_COMMUNITY): Admission: RE | Payer: Self-pay | Source: Home / Self Care

## 2024-07-18 SURGERY — HYSTERECTOMY, VAGINAL, LAPAROSCOPY-ASSISTED
Anesthesia: General

## 2024-07-21 ENCOUNTER — Encounter (INDEPENDENT_AMBULATORY_CARE_PROVIDER_SITE_OTHER): Payer: Self-pay | Admitting: Family Medicine

## 2024-07-21 ENCOUNTER — Ambulatory Visit (INDEPENDENT_AMBULATORY_CARE_PROVIDER_SITE_OTHER): Admitting: Family Medicine

## 2024-07-21 VITALS — BP 128/87 | HR 78 | Temp 98.0°F | Ht 62.0 in | Wt 186.0 lb

## 2024-07-21 DIAGNOSIS — Z6834 Body mass index (BMI) 34.0-34.9, adult: Secondary | ICD-10-CM

## 2024-07-21 DIAGNOSIS — I10 Essential (primary) hypertension: Secondary | ICD-10-CM | POA: Diagnosis not present

## 2024-07-21 DIAGNOSIS — E669 Obesity, unspecified: Secondary | ICD-10-CM | POA: Diagnosis not present

## 2024-07-21 DIAGNOSIS — E559 Vitamin D deficiency, unspecified: Secondary | ICD-10-CM

## 2024-07-21 DIAGNOSIS — K047 Periapical abscess without sinus: Secondary | ICD-10-CM | POA: Diagnosis not present

## 2024-07-21 MED ORDER — VITAMIN D (ERGOCALCIFEROL) 1.25 MG (50000 UNIT) PO CAPS: 50000.0000 [IU] | ORAL_CAPSULE | ORAL | 0 refills | Status: DC

## 2024-07-21 MED ORDER — SEMAGLUTIDE-WEIGHT MANAGEMENT 1.7 MG/0.75ML ~~LOC~~ SOAJ: 1.7000 mg | SUBCUTANEOUS | 0 refills | Status: DC

## 2024-07-21 NOTE — Progress Notes (Signed)
 Office: [phone]  /  Fax: [phone]  WEIGHT SUMMARY AND BIOMETRICS  Anthropometric Measurements Height: 5' 2 (1.575 m) Weight: 186 lb (84.4 kg) BMI (Calculated): 34.01 Weight at Last Visit: 187 lb Weight Lost Since Last Visit: 1 lb Weight Gained Since Last Visit: 0 Starting Weight: 191 lb Total Weight Loss (lbs): 5 lb (2.268 kg) Peak Weight: 207 lb   Body Composition  Body Fat %: 38.8 % Fat Mass (lbs): 72.2 lbs Muscle Mass (lbs): 108.2 lbs Total Body Water (lbs): 73.4 lbs Visceral Fat Rating : 9   Other Clinical Data Fasting: yes Labs: no Today's Visit #: 28 Starting Date: 09/08/18    Chief Complaint: OBESITY   Discussed the use of AI scribe software for clinical note transcription with the patient, who gave verbal consent to proceed.  History of Present Illness Patricia Lin is a 43 year old female who presents for obesity treatment and progress assessment.  She has been following the category one eating plan about thirty percent of the time and exercises sixty minutes three times a week. She has lost one pound in the last month since her last visit.  She is experiencing a dental infection affecting her ability to eat, leading her to prefer soft foods and avoid hot or cold items due to tooth pain. She has been on antibiotics for almost a month for the infection, which requires further dental work, including root canals and possibly extractions. She is currently taking Wegovy  and vitamin D  supplements. She uses ibuprofen  for pain relief but is cautious about taking it on an empty stomach.  She is experiencing stress related to sending two of her four children to college in [place], which she finds overwhelming. She is concerned about her daughter's safety and her tendency to be 'glued to her phone'.      PHYSICAL EXAM:  Blood pressure 128/87, pulse 78, temperature 98 F (36.7 C), height 5' 2 (1.575 m), weight 186 lb (84.4 kg), last menstrual period  06/22/2024, SpO2 100%. Body mass index is 34.02 kg/m.  DIAGNOSTIC DATA REVIEWED:  BMET    Component Value Date/Time   NA 144 07/06/2024 1036   K 4.1 07/06/2024 1036   CL 103 07/06/2024 1036   CO2 19 (L) 07/06/2024 1036   GLUCOSE 86 07/06/2024 1036   GLUCOSE 94 06/06/2024 0846   BUN 12 07/06/2024 1036   CREATININE 0.70 07/06/2024 1036   CREATININE 0.71 06/06/2024 0846   CREATININE 0.64 01/17/2013 1720   CALCIUM 9.8 07/06/2024 1036   GFRNONAA >60 06/06/2024 0846   GFRAA >60 09/04/2019 0130   Lab Results  Component Value Date   HGBA1C 4.5 (L) 07/06/2024   HGBA1C 5.2 09/08/2018   Lab Results  Component Value Date   INSULIN  16.3 11/25/2023   INSULIN  11.5 09/08/2018   Lab Results  Component Value Date   TSH 0.918 05/09/2024   CBC    Component Value Date/Time   WBC 4.3 07/06/2024 1036   WBC 3.7 (L) 06/06/2024 0846   WBC 3.9 (L) 07/07/2023 1458   RBC 4.48 07/06/2024 1036   RBC 3.82 (L) 06/06/2024 0846   HGB 9.9 (L) 07/06/2024 1036   HCT 35.8 07/06/2024 1036   PLT 381 07/06/2024 1036   MCV 80 07/06/2024 1036   MCH 22.1 (L) 07/06/2024 1036   MCH 19.6 (L) 06/06/2024 0846   MCHC 27.7 (L) 07/06/2024 1036   MCHC 29.1 (L) 06/06/2024 0846   RDW 24.1 (H) 07/06/2024 1036   Iron  Studies  Component Value Date/Time   IRON  14 (L) 06/06/2024 0846   IRON  13 (L) 05/09/2024 1102   TIBC 399 06/06/2024 0846   TIBC 398 05/09/2024 1102   FERRITIN 5 (L) 06/06/2024 0847   FERRITIN 5 (L) 05/09/2024 1102   IRONPCTSAT 4 (L) 06/06/2024 0846   IRONPCTSAT 3 (LL) 05/09/2024 1102   Lipid Panel     Component Value Date/Time   CHOL 199 05/09/2024 1102   TRIG 67 05/09/2024 1102   HDL 75 05/09/2024 1102   CHOLHDL 2.7 05/09/2024 1102   LDLCALC 112 (H) 05/09/2024 1102   Hepatic Function Panel     Component Value Date/Time   PROT 7.2 07/06/2024 1036   ALBUMIN 4.7 07/06/2024 1036   AST 19 07/06/2024 1036   AST 15 06/06/2024 0846   ALT 22 07/06/2024 1036   ALT 16 06/06/2024 0846    ALKPHOS 76 07/06/2024 1036   BILITOT 0.4 07/06/2024 1036   BILITOT 0.5 06/06/2024 0846      Component Value Date/Time   TSH 0.918 05/09/2024 1102   Nutritional Lab Results  Component Value Date   VD25OH 30.3 11/25/2023   VD25OH 23.2 (L) 11/11/2022   VD25OH 22.2 (L) 06/11/2022     Assessment and Plan Assessment & Plan Obesity Obesity management is ongoing. She follows the category one eating plan 30% of the time and exercises 60 minutes three times a week. She has lost one pound in the last month. Current treatment includes Wegovy , which she tolerates well without gastrointestinal issues. Increasing the dose of Wegovy  is not advised due to current nutritional challenges. - Continue Wegovy  - Encourage adherence to the Cat 1 eating plan - Encourage regular exercise - Advise against increasing Wegovy  dose until nutritional intake improves  Essential hypertension Blood pressure has been high recently, potentially exacerbated by dental pain. Blood pressure was normal during today's visit. - Address dental pain to help manage blood pressure will continue to monitor - Continue diet, exercise and weight loss as discussed today as an important part of the treatment plan   Dental infection Ongoing dental infection with pain affecting eating habits. She has been on antibiotics for almost a month. The infection is affecting her bones, requiring further dental intervention. Pain management is currently with ibuprofen . Topical pain relief options should be considered to avoid prolonged NSAID use. - Complete current course of antibiotics - Encourage consumption of soft, protein-rich foods to aid healing - Consider topical pain relief options  Vitamin D  deficiency Vitamin D  deficiency is being managed with supplementation. She is due for a refill of her vitamin D  prescription. - Refill vitamin D  prescription    She was informed of the importance of frequent follow up visits to maximize  her success with intensive lifestyle modifications for her multiple health conditions.    Louann Penton, MD

## 2024-07-25 NOTE — Progress Notes (Unsigned)
 Cardiology Office Note   Date:  07/26/2024   ID:  Patricia Patricia, DOB [birth year], MRN [id number]  PCP:  Patricia Greig PARAS, NP  Cardiologist:   Patricia Gull, MD   Pt presents for follow up of HTN    History of Present Illness: Patricia Patricia is a 43 y.o. female with a history of Fe deficient anemia( menses) preeclampsia,  murmur.   I saw her   in June 2023   She was referred for murmur  She did not have one  on exam. Echo showed normal valve function   LVEF normal Her BP was elevated and amlodpine was added 2.5 mg    I saw the pt in Oct 2023 [facility] denies CP   Notes flutter in chest over the past month   Episodes occur about 2x per day   Last about 1 minute  SOme dizziness.  Feels most when lays down.  Had 2 surgeries cancelled due to high BP (dental, hysterectomy)  BP over 190/  Pt is now on valsartan    Started 3 wks ago      The pt is active withoiut SOB or CP with activity     Current Meds  Medication Sig   amoxicillin  (AMOXIL ) 500 MG tablet Take 500 mg by mouth in the morning, at noon, and at bedtime.   butalbital -acetaminophen -caffeine  (FIORICET) 50-325-40 MG tablet    clotrimazole -betamethasone  (LOTRISONE ) cream    diazepam (VALIUM) 10 MG tablet Take 10 mg by mouth every 6 (six) hours as needed for anxiety.   ferrous sulfate  325 (65 FE) MG tablet TAKE 1 TABLET BY MOUTH 3 TIMES DAILY WITH MEALS.   ibuprofen  (ADVIL ) 600 MG tablet    Insulin  Pen Needle (BD PEN NEEDLE NANO 2ND GEN) 32G X 4 MM MISC Use 1 needle daily to inject medication.   Iron , Ferrous Sulfate , 325 (65 Fe) MG TABS Take 325 mg by mouth daily.   Iron , Ferrous Sulfate , 325 (65 Fe) MG TABS Take 325 mg by mouth daily.   meloxicam  (MOBIC ) 7.5 MG tablet Take 1 tablet (7.5 mg total) by mouth daily.   metoCLOPramide  (REGLAN ) 10 MG tablet    metroNIDAZOLE  (FLAGYL ) 250 MG tablet    nitrofurantoin , macrocrystal-monohydrate, (MACROBID ) 100 MG capsule Take 1 capsule (100 mg total) by mouth 2 (two) times daily.    nystatin -triamcinolone  ointment (MYCOLOG) Apply 1 Application topically 2 (two) times daily.   phenazopyridine  (PYRIDIUM ) 200 MG tablet Take 1 tablet (200 mg total) by mouth 3 (three) times daily.   [START ON 07/31/2024] semaglutide -weight management (WEGOVY ) 1.7 MG/0.75ML SOAJ SQ injection Inject 1.7 mg into the skin once a week for 28 days.   valsartan  (DIOVAN ) 40 MG tablet Take 40 mg by mouth daily.   Vitamin D , Ergocalciferol , (DRISDOL ) 1.25 MG (50000 UNIT) CAPS capsule Take 1 capsule (50,000 Units total) by mouth every 7 (seven) days.   [DISCONTINUED] UNABLE TO FIND Take 1 capsule by mouth every 7 (seven) days.   [DISCONTINUED] valsartan  (DIOVAN ) 40 MG tablet Take 1 tablet (40 mg total) by mouth daily.   Current Facility-Administered Medications for the 07/26/24 encounter (Office Visit) with Patricia Patricia GAILS, MD  Medication   triamcinolone  acetonide (KENALOG -40) injection 40 mg     Allergies:   Latex   Past Medical History:  Diagnosis Date   Abnormal Pap smear    Adenomyosis    Anemia    Anxiety    Back pain    BV (bacterial vaginosis) 2011   Chlamydia  infection    CIN I (cervical intraepithelial neoplasia I) 2008   Depression    Dysmenorrhea    Dyspnea    Dysrhythmia 2009   TACHYCARDIA;   HAD W/U 2010? 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  B/P nl,  and 122/76 on repeat-sitting   Postpartum depression 2011   NO MEDS   Pregnancy induced hypertension    Lin PREGNANCIES   Shortness of breath    WITH EXERTION SICNE PREGNANCY   Tachycardia    Tachycardia 2010   HAS HAD WORKUP. UNSURE IF HAS POSSIBLE HEART VALVE ISSUE   Vulvitis 2009    Past Surgical History:  Procedure Laterality Date   BREAST REDUCTION SURGERY  12/08/2006   BREAST SURGERY     CERVICAL BIOPSY  W/ LOOP ELECTRODE EXCISION  2010   DILATION AND CURETTAGE OF UTERUS     FINGER SURGERY     Right index finger   TUBAL LIGATION  01/09/2013   Procedure: POST PARTUM TUBAL LIGATION;  Surgeon: Patricia DELENA All, MD;  Location: [facility];  Service: Gynecology;  Laterality: Bilateral;  post partum tubal ligation bilateral   US  ECHOCARDIOGRAPHY  06/18/2009   ef 55-60%   WISDOM TOOTH EXTRACTION       Social History:  The patient  reports that she has never smoked. She has never used smokeless tobacco. She reports current alcohol  use. She reports that she does not use drugs.   Family History:  The patient's family history includes Alcohol  abuse in her maternal uncle; Anxiety disorder in her mother; Asthma in her daughter and mother; Coronary artery disease in her mother; Depression in her mother; Diabetes in her mother; Drug abuse in her maternal uncle; Eating disorder in her mother; Heart disease in her father and mother; High Cholesterol in her father; Hyperlipidemia in her mother; Hypertension in her father, maternal grandfather, maternal grandmother, and mother; Obesity in her mother; Sleep apnea in her mother.    ROS:  Please see the history of present illness. Lin other systems are reviewed and  Negative to the above problem except as noted.    PHYSICAL EXAM: VS:  BP 138/88   Pulse 67   Ht 5' 1 (1.549 m)   Wt 186 lb 3.2 oz (84.5 kg)   LMP 06/22/2024   SpO2 99%   BMI 35.18 kg/m   GEN: Obese 43 yo in NAD  HEENT: normal  Neck: no JVD, no carotid bruits  Cardiac: RRR; no  murmur   No LE edema  Respiratory:  clear to auscultation GI: soft, nontender. No masses Ext  No LE edema    EKG:  EKG  SR 68 bpm  Echo   July 2023   1. Left ventricular ejection fraction, by estimation, is 60 to 65%. The  left ventricle has normal function. The left ventricle has no regional  wall motion abnormalities. There is mild left ventricular hypertrophy.  Left ventricular diastolic parameters  were normal.   2. Right ventricular systolic function is normal. The right ventricular  size is normal. Tricuspid regurgitation signal is inadequate for assessing  PA pressure.   3. The mitral valve is normal in structure. Trivial mitral valve  regurgitation. No evidence of mitral stenosis.   4. The aortic valve is tricuspid. Aortic valve regurgitation is not  visualized. No aortic stenosis is present.   5. The inferior vena cava is dilated in size with >50% respiratory  variability, suggesting right atrial pressure of 8 mmHg.    Lipid Panel    Component Value Date/Time   CHOL 199 05/09/2024 1102   TRIG 67 05/09/2024 1102   HDL 75 05/09/2024 1102   CHOLHDL 2.7 05/09/2024 1102   LDLCALC 112 (H) 05/09/2024 1102      Wt Readings from Last 3 Encounters:  07/26/24 186 lb 3.2 oz (84.5 kg)  07/21/24 186 lb (84.4 kg)  07/12/24 188 lb 9.6 oz (85.5 kg)      ASSESSMENT AND PLAN:    1  HTN  BP is better but still a little elevated    I have recomm she increase valsartan  to 80 mg   FOllow BP   Will check BMET amd CBC   2  HL   LDL 112  HDL 75  Trig 67     Follow diet     3   Palpitations     Will set up for a Zio patch to capture     Pt has concerned about  Wants to proceed with echo as well.   4  Preop risk assessment   From cardiac standpoint I think pt is at low risk for major cardiac event   OK to proceed.    Plan for follow up in October     Current medicines are reviewed at length with the patient today.  The patient does not have concerns regarding  medicines.  Signed, Patricia Gull, MD  07/26/2024 10:30 AM    [facility] [address] [place], [place], [place]  [address] Phone: [phone]; Fax: [phone]

## 2024-07-26 ENCOUNTER — Ambulatory Visit: Attending: Internal Medicine | Admitting: Internal Medicine

## 2024-07-26 ENCOUNTER — Encounter: Payer: Self-pay | Admitting: Internal Medicine

## 2024-07-26 VITALS — BP 138/88 | HR 67 | Ht 61.0 in | Wt 186.2 lb

## 2024-07-26 DIAGNOSIS — I10 Essential (primary) hypertension: Secondary | ICD-10-CM

## 2024-07-26 DIAGNOSIS — Z862 Personal history of diseases of the blood and blood-forming organs and certain disorders involving the immune mechanism: Secondary | ICD-10-CM | POA: Diagnosis not present

## 2024-07-26 MED ORDER — VALSARTAN 80 MG PO TABS: 80.0000 mg | ORAL_TABLET | Freq: Every day | ORAL | 2 refills | Status: AC

## 2024-07-26 NOTE — Patient Instructions (Addendum)
 Medication Instructions:  Your physician has recommended you make the following change in your medication:   1) INCREASE valsartan  to 80 mg daily  *If you need a refill on your cardiac medications before your next appointment, please call your pharmacy*  Lab Work: TODAY: CBC, BMET If you have labs (blood work) drawn today and your tests are completely normal, you will receive your results only by: MyChart Message (if you have MyChart) OR A paper copy in the mail If you have any lab test that is abnormal or we need to change your treatment, we will call you to review the results.  Testing/Procedures: Your physician has requested that you have an echocardiogram. Echocardiography is a painless test that uses sound waves to create images of your heart. It provides your doctor with information about the size and shape of your heart and how well your heart's chambers and valves are working. This procedure takes approximately one hour. There are no restrictions for this procedure. Please do NOT wear cologne, perfume, aftershave, or lotions (deodorant is allowed). Please arrive 15 minutes prior to your appointment time.  Please note: We ask at that you not bring children with you during ultrasound (echo/ vascular) testing. Due to room size and safety concerns, children are not allowed in the ultrasound rooms during exams. Our front office staff cannot provide observation of children in our lobby area while testing is being conducted. An adult accompanying a patient to their appointment will only be allowed in the ultrasound room at the discretion of the ultrasound technician under special circumstances. We apologize for any inconvenience.   Follow-Up: At [facility], you and your health needs are our priority.  As part of our continuing mission to provide you with exceptional heart care, our providers are all part of one team.  This team includes your primary Cardiologist (physician) and  Advanced Practice Providers or APPs (Physician Assistants and Nurse Practitioners) who all work together to provide you with the care you need, when you need it.  Your next appointment:   2 month(s)  Provider:   Vina Gull, MD or One of our Advanced Practice Providers (APPs): Morse Clause, PA-C  Lamarr Satterfield, NP Miriam Shams, NP  Olivia Pavy, PA-C Josefa Beauvais, NP  Leontine Salen, PA-C Orren Fabry, PA-C  [place], PA-C Ernest Dick, NP  Damien Braver, NP Jon Hails, PA-C  Waddell Donath, PA-C    Dayna Dunn, PA-C  Scott Weaver, PA-C Lum Louis, NP Katlyn West, NP Callie Goodrich, PA-C  Evan Williams, PA-C Sheng Haley, PA-C  Xika Zhao, NP Kathleen Zamar Odwyer, PA-C   We recommend signing up for the patient portal called MyChart.  Sign up information is provided on this After Visit Summary.  MyChart is used to connect with patients for Virtual Visits (Telemedicine).  Patients are able to view lab/test results, encounter notes, upcoming appointments, etc.  Non-urgent messages can be sent to your provider as well.    To learn more about what you can do with MyChart, go to [email or web address].

## 2024-07-27 ENCOUNTER — Ambulatory Visit: Payer: Self-pay

## 2024-07-27 LAB — CBC
Hematocrit: 35.8 % (ref 34.0–46.6)
Hemoglobin: 10.7 g/dL — ABNORMAL LOW (ref 11.1–15.9)
MCH: 24.5 pg — ABNORMAL LOW (ref 26.6–33.0)
MCHC: 29.9 g/dL — ABNORMAL LOW (ref 31.5–35.7)
MCV: 82 fL (ref 79–97)
Platelets: 294 x10E3/uL (ref 150–450)
RBC: 4.36 x10E6/uL (ref 3.77–5.28)
RDW: 21.3 % — ABNORMAL HIGH (ref 11.7–15.4)
WBC: 2.7 x10E3/uL — ABNORMAL LOW (ref 3.4–10.8)

## 2024-07-27 LAB — BASIC METABOLIC PANEL WITH GFR
BUN/Creatinine Ratio: 22 (ref 9–23)
BUN: 15 mg/dL (ref 6–24)
CO2: 20 mmol/L (ref 20–29)
Calcium: 9.1 mg/dL (ref 8.7–10.2)
Chloride: 106 mmol/L (ref 96–106)
Creatinine, Ser: 0.69 mg/dL (ref 0.57–1.00)
Glucose: 77 mg/dL (ref 70–99)
Potassium: 4.1 mmol/L (ref 3.5–5.2)
Sodium: 140 mmol/L (ref 134–144)
eGFR: 110 mL/min/1.73 (ref >59)

## 2024-08-05 ENCOUNTER — Ambulatory Visit: Admitting: Family

## 2024-08-08 HISTORY — PX: COLONOSCOPY WITH PROPOFOL: SHX5780

## 2024-08-09 ENCOUNTER — Ambulatory Visit
Admission: RE | Admit: 2024-08-09 | Discharge: 2024-08-09 | Disposition: A | Discharge status: Home / Self Care | Source: Ambulatory Visit

## 2024-08-09 DIAGNOSIS — Z1231 Encounter for screening mammogram for malignant neoplasm of breast: Secondary | ICD-10-CM

## 2024-08-12 ENCOUNTER — Ambulatory Visit: Payer: Self-pay | Admitting: Family

## 2024-08-16 ENCOUNTER — Institutional Professional Consult (permissible substitution): Admitting: Plastic Surgery

## 2024-08-16 ENCOUNTER — Other Ambulatory Visit: Payer: Self-pay | Admitting: Obstetrics and Gynecology

## 2024-08-18 ENCOUNTER — Ambulatory Visit (INDEPENDENT_AMBULATORY_CARE_PROVIDER_SITE_OTHER): Admitting: Family Medicine

## 2024-08-18 ENCOUNTER — Encounter (INDEPENDENT_AMBULATORY_CARE_PROVIDER_SITE_OTHER): Payer: Self-pay | Admitting: Family Medicine

## 2024-08-18 VITALS — BP 132/89 | HR 74 | Temp 97.9°F | Ht 62.0 in | Wt 181.0 lb

## 2024-08-18 DIAGNOSIS — I10 Essential (primary) hypertension: Secondary | ICD-10-CM | POA: Diagnosis not present

## 2024-08-18 DIAGNOSIS — Z6833 Body mass index (BMI) 33.0-33.9, adult: Secondary | ICD-10-CM | POA: Diagnosis not present

## 2024-08-18 DIAGNOSIS — E669 Obesity, unspecified: Secondary | ICD-10-CM | POA: Diagnosis not present

## 2024-08-18 DIAGNOSIS — Z6834 Body mass index (BMI) 34.0-34.9, adult: Secondary | ICD-10-CM

## 2024-08-18 DIAGNOSIS — E559 Vitamin D deficiency, unspecified: Secondary | ICD-10-CM | POA: Diagnosis not present

## 2024-08-18 MED ORDER — SEMAGLUTIDE-WEIGHT MANAGEMENT 1.7 MG/0.75ML ~~LOC~~ SOAJ: 1.7000 mg | SUBCUTANEOUS | 0 refills | Status: DC

## 2024-08-18 MED ORDER — VITAMIN D (ERGOCALCIFEROL) 1.25 MG (50000 UNIT) PO CAPS: 50000.0000 [IU] | ORAL_CAPSULE | ORAL | 0 refills | Status: DC

## 2024-08-18 NOTE — Progress Notes (Signed)
 Office: [phone]  /  Fax: [phone]  WEIGHT SUMMARY AND BIOMETRICS  Anthropometric Measurements Height: 5' 2 (1.575 m) Weight: 181 lb (82.1 kg) BMI (Calculated): 33.1 Weight at Last Visit: 186 lb Weight Lost Since Last Visit: 5 lb Weight Gained Since Last Visit: 0 Starting Weight: 191 lb Total Weight Loss (lbs): 10 lb (4.536 kg) Peak Weight: 207 lb   Body Composition  Body Fat %: 38 % Fat Mass (lbs): 68.8 lbs Muscle Mass (lbs): 106.8 lbs Total Body Water (lbs): 72.8 lbs Visceral Fat Rating : 9   Other Clinical Data Fasting: yes Labs: no Today's Visit #: 29 Starting Date: 09/08/18    Chief Complaint: OBESITY    History of Present Illness Patricia Lin is a 43 year old female who presents for obesity treatment and progress assessment.  She is currently on a category one plan for obesity management but has faced challenges in adherence due to recent wisdom teeth extraction and a colonoscopy. Despite these obstacles, she has managed to lose five pounds over the past month, primarily through consistent exercise on the treadmill for 30 to 45 minutes, five days a week. She is currently focusing on a diet of soft foods, which has limited her protein intake.  She is being treated for vitamin D  deficiency with a prescription of 50,000 international units weekly and is requesting a refill. Her current diet, consisting mainly of soft foods like mashed potatoes, has been influenced by her recent dental surgery.  She is also on Wegovy  at a dose of 1.7 mg for polyphagia and requests a refill. She experiences no gastrointestinal issues with Wegovy , although she notes feeling queasy if she goes too [place] without eating.  Her blood pressure is controlled at 132/89 mmHg with valsartan . She is actively working on diet, exercise, and weight loss to further improve her blood pressure.      PHYSICAL EXAM:  Blood pressure 132/89, pulse 74, temperature 97.9 F (36.6 C), height  5' 2 (1.575 m), weight 181 lb (82.1 kg), last menstrual period 06/22/2024, SpO2 100%. Body mass index is 33.11 kg/m.  DIAGNOSTIC DATA REVIEWED:  BMET    Component Value Date/Time   NA 140 07/26/2024 1205   K 4.1 07/26/2024 1205   CL 106 07/26/2024 1205   CO2 20 07/26/2024 1205   GLUCOSE 77 07/26/2024 1205   GLUCOSE 94 06/06/2024 0846   BUN 15 07/26/2024 1205   CREATININE 0.69 07/26/2024 1205   CREATININE 0.71 06/06/2024 0846   CREATININE 0.64 01/17/2013 1720   CALCIUM 9.1 07/26/2024 1205   GFRNONAA >60 06/06/2024 0846   GFRAA >60 09/04/2019 0130   Lab Results  Component Value Date   HGBA1C 4.5 (L) 07/06/2024   HGBA1C 5.2 09/08/2018   Lab Results  Component Value Date   INSULIN  16.3 11/25/2023   INSULIN  11.5 09/08/2018   Lab Results  Component Value Date   TSH 0.918 05/09/2024   CBC    Component Value Date/Time   WBC 2.7 (L) 07/26/2024 1205   WBC 3.7 (L) 06/06/2024 0846   WBC 3.9 (L) 07/07/2023 1458   RBC 4.36 07/26/2024 1205   RBC 3.82 (L) 06/06/2024 0846   HGB 10.7 (L) 07/26/2024 1205   HCT 35.8 07/26/2024 1205   PLT 294 07/26/2024 1205   MCV 82 07/26/2024 1205   MCH 24.5 (L) 07/26/2024 1205   MCH 19.6 (L) 06/06/2024 0846   MCHC 29.9 (L) 07/26/2024 1205   MCHC 29.1 (L) 06/06/2024 0846   RDW 21.3 (  H) 07/26/2024 1205   Iron  Studies    Component Value Date/Time   IRON  14 (L) 06/06/2024 0846   IRON  13 (L) 05/09/2024 1102   TIBC 399 06/06/2024 0846   TIBC 398 05/09/2024 1102   FERRITIN 5 (L) 06/06/2024 0847   FERRITIN 5 (L) 05/09/2024 1102   IRONPCTSAT 4 (L) 06/06/2024 0846   IRONPCTSAT 3 (LL) 05/09/2024 1102   Lipid Panel     Component Value Date/Time   CHOL 199 05/09/2024 1102   TRIG 67 05/09/2024 1102   HDL 75 05/09/2024 1102   CHOLHDL 2.7 05/09/2024 1102   LDLCALC 112 (H) 05/09/2024 1102   Hepatic Function Panel     Component Value Date/Time   PROT 7.2 07/06/2024 1036   ALBUMIN 4.7 07/06/2024 1036   AST 19 07/06/2024 1036   AST 15  06/06/2024 0846   ALT 22 07/06/2024 1036   ALT 16 06/06/2024 0846   ALKPHOS 76 07/06/2024 1036   BILITOT 0.4 07/06/2024 1036   BILITOT 0.5 06/06/2024 0846      Component Value Date/Time   TSH 0.918 05/09/2024 1102   Nutritional Lab Results  Component Value Date   VD25OH 30.3 11/25/2023   VD25OH 23.2 (L) 11/11/2022   VD25OH 22.2 (L) 06/11/2022     Assessment and Plan Assessment & Plan Obesity  Obesity management is ongoing with a 5-pound weight loss in the last month due to regular treadmill exercise. Dietary intake has been affected by recent dental surgery and a colonoscopy, limiting her to soft foods. Protein intake is challenging due to her dislike for protein shakes and yogurt, and recent dental issues. Two pounds of the weight loss is likely muscle mass. - Continue current exercise regimen - Encourage protein intake through alternative sources such as protein pudding, eggs, string cheese, and Fairlife milk  Essential hypertension Blood pressure is controlled at 132/89 mmHg with valsartan . Recent dental surgery pain may have contributed to a slight increase in systolic pressure. - Continue valsartan  - Monitor blood pressure regularly - Continue diet, exercise and weight loss as discussed today as an important part of the treatment plan   Vitamin D  deficiency Vitamin D  deficiency is managed with prescription vitamin D  50,000 IU weekly. She requests a refill, indicating adherence to the treatment plan. - Refill prescription for vitamin D  50,000 IU weekly - Continue to monitor     Patricia Lin was informed of the importance of frequent follow up visits to maximize her success with intensive lifestyle modifications for her obesity and obesity related health conditions as recommended by USPSTF and CMS guidelines   Louann Penton, MD

## 2024-08-24 ENCOUNTER — Ambulatory Visit (HOSPITAL_COMMUNITY)
Admission: RE | Admit: 2024-08-24 | Discharge: 2024-08-24 | Disposition: A | Discharge status: Home / Self Care | Source: Ambulatory Visit | Attending: Cardiovascular Disease | Admitting: Cardiovascular Disease

## 2024-08-24 DIAGNOSIS — I10 Essential (primary) hypertension: Secondary | ICD-10-CM | POA: Diagnosis not present

## 2024-08-24 LAB — ECHOCARDIOGRAM COMPLETE
Area-P 1/2: 4.1 cm2
S' Lateral: 2.7 cm

## 2024-09-05 ENCOUNTER — Inpatient Hospital Stay: Attending: Internal Medicine

## 2024-09-05 ENCOUNTER — Inpatient Hospital Stay: Admitting: Internal Medicine

## 2024-09-05 VITALS — BP 138/86 | HR 72 | Temp 97.4°F | Resp 17 | Ht 62.0 in | Wt 183.2 lb

## 2024-09-05 DIAGNOSIS — N920 Excessive and frequent menstruation with regular cycle: Secondary | ICD-10-CM | POA: Diagnosis not present

## 2024-09-05 DIAGNOSIS — D500 Iron deficiency anemia secondary to blood loss (chronic): Secondary | ICD-10-CM | POA: Diagnosis present

## 2024-09-05 DIAGNOSIS — D509 Iron deficiency anemia, unspecified: Secondary | ICD-10-CM

## 2024-09-05 DIAGNOSIS — D649 Anemia, unspecified: Secondary | ICD-10-CM | POA: Diagnosis not present

## 2024-09-05 LAB — CBC WITH DIFFERENTIAL (CANCER CENTER ONLY)
Abs Immature Granulocytes: 0.01 K/uL (ref 0.00–0.07)
Basophils Absolute: 0 K/uL (ref 0.0–0.1)
Basophils Relative: 1 %
Eosinophils Absolute: 0.1 K/uL (ref 0.0–0.5)
Eosinophils Relative: 1 %
HCT: 29.2 % — ABNORMAL LOW (ref 36.0–46.0)
Hemoglobin: 9.1 g/dL — ABNORMAL LOW (ref 12.0–15.0)
Immature Granulocytes: 0 %
Lymphocytes Relative: 48 %
Lymphs Abs: 1.7 K/uL (ref 0.7–4.0)
MCH: 24.6 pg — ABNORMAL LOW (ref 26.0–34.0)
MCHC: 31.2 g/dL (ref 30.0–36.0)
MCV: 78.9 fL — ABNORMAL LOW (ref 80.0–100.0)
Monocytes Absolute: 0.2 K/uL (ref 0.1–1.0)
Monocytes Relative: 7 %
Neutro Abs: 1.5 K/uL — ABNORMAL LOW (ref 1.7–7.7)
Neutrophils Relative %: 43 %
Platelet Count: 241 K/uL (ref 150–400)
RBC: 3.7 MIL/uL — ABNORMAL LOW (ref 3.87–5.11)
RDW: 16.3 % — ABNORMAL HIGH (ref 11.5–15.5)
WBC Count: 3.5 K/uL — ABNORMAL LOW (ref 4.0–10.5)
nRBC: 0 % (ref 0.0–0.2)

## 2024-09-05 LAB — IRON AND IRON BINDING CAPACITY (CC-WL,HP ONLY)
Iron: 18 ug/dL — ABNORMAL LOW (ref 28–170)
Saturation Ratios: 5 % — ABNORMAL LOW (ref 10.4–31.8)
TIBC: 365 ug/dL (ref 250–450)
UIBC: 347 ug/dL (ref 148–442)

## 2024-09-05 LAB — FERRITIN: Ferritin: 9 ng/mL — ABNORMAL LOW (ref 11–307)

## 2024-09-05 NOTE — Addendum Note (Signed)
 Addended by: KOLEEN RENSHAW R on: 09/05/2024 12:26 PM   Modules accepted: Orders

## 2024-09-05 NOTE — Progress Notes (Signed)
 [facility] [phone]   [phone]  OFFICE PROGRESS NOTE  Lorren Greig PARAS, NP [address] [place] [address]  DIAGNOSIS: Iron  deficiency anemia secondary to menorrhagia.  PRIOR THERAPY: Ferrous sulfate  325 mg p.o. 3 times daily with no improvement.  CURRENT THERAPY: Venofer  300 Mg IV weekly for 3 weeks.  Next infusion June 08, 2024. INTERVAL HISTORY: Patricia Lin 43 y.o. female returns to the clinic today for follow-up visit. Discussed the use of AI scribe software for clinical note transcription with the patient, who gave verbal consent to proceed.  History of Present Illness Patricia Lin is a 43 year old female with iron  deficiency anemia secondary to menorrhagia who presents for evaluation and repeat blood work.  She has iron  deficiency anemia due to menorrhagia and received an iron  infusion with Venofer  in July 2025, consisting of three sessions. She noted slight improvement after the infusions. Her hemoglobin level increased from 7.5 to 9.1 since the last visit.  She was unable to undergo a scheduled surgery due to high blood pressure and has rescheduled the hysterectomy for October 28, 2024.  She mentions feeling 'sleepy today' and attributes it to waking up early for the visit.    MEDICAL HISTORY: Past Medical History:  Diagnosis Date   Abnormal Pap smear    Adenomyosis    Anemia    Anxiety    Back pain    BV (bacterial vaginosis) 2011   Chlamydia infection    CIN I (cervical intraepithelial neoplasia I) 2008   Depression    Dysmenorrhea    Dyspnea    Dysrhythmia 2009   TACHYCARDIA;   HAD W/U 2010? 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  B/P nl, and 122/76 on repeat-sitting   Postpartum depression 2011   NO MEDS   Pregnancy induced hypertension    ALL PREGNANCIES   Shortness of breath    WITH EXERTION SICNE PREGNANCY   Tachycardia    Tachycardia 2010   HAS HAD WORKUP. UNSURE IF HAS POSSIBLE HEART VALVE ISSUE   Vulvitis 2009    ALLERGIES:  is allergic to latex.  MEDICATIONS:  Current Outpatient Medications  Medication Sig Dispense Refill   amoxicillin  (AMOXIL ) 500 MG tablet Take 500 mg by mouth in the morning, at noon, and at bedtime.     butalbital -acetaminophen -caffeine  (FIORICET) 50-325-40 MG tablet      clotrimazole -betamethasone  (LOTRISONE ) cream      diazepam (VALIUM) 10 MG tablet Take 10 mg by mouth every 6 (six) hours as needed for anxiety.     ferrous sulfate  325 (65 FE) MG tablet TAKE 1 TABLET BY  MOUTH 3 TIMES DAILY WITH MEALS. 90 tablet 3   ibuprofen  (ADVIL ) 600 MG tablet      Insulin  Pen Needle (BD PEN NEEDLE NANO 2ND GEN) 32G X 4 MM MISC Use 1 needle daily to inject medication. 100 each 0   Iron , Ferrous Sulfate , 325 (65 Fe) MG TABS Take 325 mg by mouth daily. 90 tablet 0   Iron , Ferrous Sulfate , 325 (65 Fe) MG TABS Take 325 mg by mouth daily. 90 tablet 0   meloxicam  (MOBIC ) 7.5 MG tablet Take 1 tablet (7.5 mg total) by mouth daily. 90 tablet 0   metoCLOPramide  (REGLAN ) 10 MG tablet      metroNIDAZOLE  (FLAGYL ) 250 MG tablet      nitrofurantoin , macrocrystal-monohydrate, (MACROBID ) 100 MG capsule Take 1 capsule (100 mg total) by mouth 2 (two) times daily. 10 capsule 0   nystatin -triamcinolone  ointment (MYCOLOG) Apply 1 Application  topically 2 (two) times daily. 60 g 2   phenazopyridine  (PYRIDIUM ) 200 MG tablet Take 1 tablet (200 mg total) by mouth 3 (three) times daily. 6 tablet 0   semaglutide -weight management (WEGOVY ) 1.7 MG/0.75ML SOAJ SQ injection Inject 1.7 mg into the skin once a week for 28 days. 3 mL 0   valsartan  (DIOVAN ) 80 MG tablet Take 1 tablet (80 mg total) by mouth daily. 90 tablet 2   Vitamin D , Ergocalciferol , (DRISDOL ) 1.25 MG (50000 UNIT) CAPS capsule Take 1 capsule (50,000 Units total) by mouth every 7 (seven) days. 4 capsule 0   Current Facility-Administered Medications  Medication Dose Route Frequency Provider Last Rate Last Admin   triamcinolone  acetonide (KENALOG -40) injection 40 mg  40 mg Intramuscular Once Lorren Greig PARAS, NP        SURGICAL HISTORY:  Past Surgical History:  Procedure Laterality Date   BREAST REDUCTION SURGERY  12/08/2006   BREAST SURGERY     CERVICAL BIOPSY  W/ LOOP ELECTRODE EXCISION  2010   DILATION AND CURETTAGE OF UTERUS     FINGER SURGERY     Right index finger   TUBAL LIGATION  01/09/2013   Procedure: POST PARTUM TUBAL LIGATION;  Surgeon: Ovid DELENA All, MD;  Location: [facility];  Service: Gynecology;  Laterality: Bilateral;  post partum tubal ligation bilateral   US  ECHOCARDIOGRAPHY  06/18/2009   ef 55-60%   WISDOM TOOTH EXTRACTION      REVIEW OF SYSTEMS:  A comprehensive review of systems was negative except for: Constitutional: positive for fatigue   PHYSICAL EXAMINATION: General appearance: alert, cooperative, fatigued, and no distress Head: Normocephalic, without obvious abnormality, atraumatic Neck: no adenopathy, no JVD, supple, symmetrical, trachea midline, and thyroid  not enlarged, symmetric, no tenderness/mass/nodules Lymph nodes: Cervical, supraclavicular, and axillary nodes normal. Resp: clear to auscultation bilaterally Back: symmetric, no curvature. ROM normal. No CVA tenderness. Cardio: regular rate and rhythm, S1, S2 normal, no murmur, click,  rub or gallop GI: soft, non-tender; bowel sounds normal; no masses,  no organomegaly Extremities: extremities normal, atraumatic, no cyanosis or edema  ECOG PERFORMANCE STATUS: 1 - Symptomatic but completely ambulatory  Blood pressure 138/86, pulse 72, temperature (!) 97.4 F (36.3 C), resp. rate 17, height 5' 2 (1.575 m), weight 183 lb 3.2 oz (83.1 kg), last menstrual period 06/22/2024, SpO2 98%.  LABORATORY DATA: Lab Results  Component Value Date   WBC 3.5 (L) 09/05/2024   HGB 9.1 (L) 09/05/2024   HCT 29.2 (L) 09/05/2024   MCV 78.9 (L) 09/05/2024   PLT 241 09/05/2024      Chemistry  Component Value Date/Time   NA 140 07/26/2024 1205   K 4.1 07/26/2024 1205   CL 106 07/26/2024 1205   CO2 20 07/26/2024 1205   BUN 15 07/26/2024 1205   CREATININE 0.69 07/26/2024 1205   CREATININE 0.71 06/06/2024 0846   CREATININE 0.64 01/17/2013 1720      Component Value Date/Time   CALCIUM 9.1 07/26/2024 1205   ALKPHOS 76 07/06/2024 1036   AST 19 07/06/2024 1036   AST 15 06/06/2024 0846   ALT 22 07/06/2024 1036   ALT 16 06/06/2024 0846   BILITOT 0.4 07/06/2024 1036   BILITOT 0.5 06/06/2024 0846       RADIOGRAPHIC STUDIES: ECHOCARDIOGRAM COMPLETE Result Date: 08/24/2024    ECHOCARDIOGRAM REPORT   Patient Name:   JOSEPH JOHNS Asche Date of Exam: 08/24/2024 Medical Rec #:  [id number]     Height:       62.0 in Accession #:    [id number]    Weight:       181.0 lb Date of Birth:  [birth year]     BSA:          1.832 m Patient Age:    43 years      BP:           132/89 mmHg Patient Gender: F             HR:           69 bpm. Exam Location:  [address] Procedure: 2D Echo, 3D Echo, Cardiac Doppler, Color Doppler and Strain Analysis            (Both Spectral and Color Flow Doppler were utilized during            procedure). Indications:    I10 Hypertension  History:        Patient has prior history of Echocardiogram examinations, most                 recent 06/20/2022. Arrythmias:Tachycardia,                  Signs/Symptoms:Shortness of Breath; Risk Factors:Hypertension                 and Dyslipidemia.  Sonographer:    Carl Coma RDCS Referring Phys: [id number] PAULA V ROSS IMPRESSIONS  1. Left ventricular ejection fraction, by estimation, is 55 to 60%. Left ventricular ejection fraction by 3D volume is 55 %. The left ventricle has normal function. The left ventricle has no regional wall motion abnormalities. Left ventricular diastolic  parameters were normal. The average left ventricular global longitudinal strain is -27.7 %. The global longitudinal strain is normal.  2. Right ventricular systolic function is normal. The right ventricular size is normal. There is normal pulmonary artery systolic pressure. The estimated right ventricular systolic pressure is 24.9 mmHg.  3. The mitral valve is grossly normal. Trivial mitral valve regurgitation.  4. The aortic valve is tricuspid. Aortic valve regurgitation is not visualized.  5. The inferior vena cava is normal in size with greater than 50% respiratory variability, suggesting right atrial pressure of 3 mmHg. Comparison(s): No significant change from prior study. 06/20/2022: LVEF 60-65%, mild LVH. Conclusion(s)/Recommendation(s): Otherwise normal echocardiogram, with minor abnormalities described in the report. FINDINGS  Left Ventricle: Left ventricular ejection fraction, by estimation, is 55 to 60%. Left ventricular ejection fraction by 3D volume is 55 %. The left ventricle has normal function. The left ventricle has no regional wall motion abnormalities. The average left ventricular global longitudinal  strain is -27.7 %. Strain was performed and the global longitudinal strain is normal. The left ventricular internal cavity size was normal in size. There is no left ventricular hypertrophy. Left ventricular diastolic parameters were normal. Right Ventricle: The right ventricular size is normal. No increase in right ventricular wall thickness. Right ventricular  systolic function is normal. There is normal pulmonary artery systolic pressure. The tricuspid regurgitant velocity is 2.34 m/s, and  with an assumed right atrial pressure of 3 mmHg, the estimated right ventricular systolic pressure is 24.9 mmHg. Left Atrium: Left atrial size was normal in size. Right Atrium: Right atrial size was normal in size. Pericardium: There is no evidence of pericardial effusion. Mitral Valve: The mitral valve is grossly normal. Trivial mitral valve regurgitation. Tricuspid Valve: The tricuspid valve is grossly normal. Tricuspid valve regurgitation is mild. Aortic Valve: The aortic valve is tricuspid. Aortic valve regurgitation is not visualized. Pulmonic Valve: The pulmonic valve was grossly normal. Pulmonic valve regurgitation is trivial. Aorta: The aortic root and ascending aorta are structurally normal, with no evidence of dilitation. Venous: The inferior vena cava is normal in size with greater than 50% respiratory variability, suggesting right atrial pressure of 3 mmHg. IAS/Shunts: No atrial level shunt detected by color flow Doppler. Additional Comments: 3D was performed not requiring image post processing on an independent workstation and was normal.  LEFT VENTRICLE PLAX 2D LVIDd:         4.50 cm         Diastology LVIDs:         2.70 cm         LV e' medial:    7.78 cm/s LV PW:         0.70 cm         LV E/e' medial:  12.4 LV IVS:        0.70 cm         LV e' lateral:   10.95 cm/s LVOT diam:     1.80 cm         LV E/e' lateral: 8.8 LV SV:         44 LV SV Index:   24              2D Longitudinal LVOT Area:     2.54 cm        Strain                                2D Strain GLS   -27.5 %                                (A4C):                                2D Strain GLS   -27.9 %                                (A3C):                                2D Strain GLS   -27.7 %                                (  A2C):                                2D Strain GLS   -27.7 %                                 Avg:                                 3D Volume EF                                LV 3D EF:    Left                                             ventricul                                             ar                                             ejection                                             fraction                                             by 3D                                             volume is                                             55 %.                                 3D Volume EF:                                3D EF:        55 %                                LV EDV:       142 ml  LV ESV:       63 ml                                LV SV:        79 ml RIGHT VENTRICLE             IVC RV Basal diam:  3.50 cm     IVC diam: 1.10 cm RV S prime:     15.95 cm/s TAPSE (M-mode): 2.4 cm LEFT ATRIUM             Index        RIGHT ATRIUM          Index LA diam:        4.10 cm 2.24 cm/m   RA Area:     8.55 cm LA Vol (A2C):   51.3 ml 28.00 ml/m  RA Volume:   16.10 ml 8.79 ml/m LA Vol (A4C):   57.2 ml 31.22 ml/m LA Biplane Vol: 55.3 ml 30.18 ml/m  AORTIC VALVE LVOT Vmax:   83.60 cm/s LVOT Vmean:  54.450 cm/s LVOT VTI:    0.174 m  AORTA Ao Root diam: 3.00 cm Ao Asc diam:  3.10 cm MITRAL VALVE                TRICUSPID VALVE MV Area (PHT): 4.10 cm     TR Peak grad:   21.9 mmHg MV Decel Time: 185 msec     TR Vmax:        234.00 cm/s MV E velocity: 96.70 cm/s MV A velocity: 100.00 cm/s  SHUNTS MV E/A ratio:  0.97         Systemic VTI:  0.17 m                             Systemic Diam: 1.80 cm Vinie Maxcy MD Electronically signed by Vinie Maxcy MD Signature Date/Time: 08/24/2024/11:38:21 AM    Final    MM 3D SCREENING MAMMOGRAM BILATERAL BREAST Result Date: 08/11/2024 CLINICAL DATA:  Screening. EXAM: DIGITAL SCREENING BILATERAL MAMMOGRAM WITH TOMOSYNTHESIS AND CAD TECHNIQUE: Bilateral screening digital craniocaudal and mediolateral oblique mammograms were obtained.  Bilateral screening digital breast tomosynthesis was performed. The images were evaluated with computer-aided detection. COMPARISON:  Previous exam(s). ACR Breast Density Category b: There are scattered areas of fibroglandular density. FINDINGS: There are no findings suspicious for malignancy. IMPRESSION: No mammographic evidence of malignancy. A result letter of this screening mammogram will be mailed directly to the patient. RECOMMENDATION: Screening mammogram in one year. (Code:SM-B-01Y) BI-RADS CATEGORY  1: Negative. Electronically Signed   By: Alm Parkins M.D.   On: 08/11/2024 14:54    ASSESSMENT AND PLAN: This is a very pleasant 43 years old African-American female with history of iron  deficiency anemia secondary to menorrhagia and not responding to the oral iron  tablets.  The patient was treated recently with iron  infusion with Venofer  500 Mg IV weekly for 2 weeks. She has been on oral iron  tablets and tolerating it well but not effective in controlling her anemia. She is currently on iron  infusion with Venofer  on as-needed basis.  The last infusion was in July 2025. Repeat CBC today showed low hemoglobin of 9.1 and hematocrit 29.2%. 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 Hemoglobin improved from 7.5 to  9.1 after three Venofer  infusions in July 2025, but remains suboptimal. Anticipated low iron  levels. Scheduled for hysterectomy on October 28, 2024, expected to improve condition. - Arrange for another iron  infusion with the same regimen. - Schedule follow-up appointment around October 17, 2024, to check hemoglobin and iron  levels before surgery. She was advised to call immediately if she has any concerning symptoms in the interval.  The patient voices understanding of current disease status and treatment options and is in agreement with the current care plan.  All  questions were answered. The patient knows to call the clinic with any problems, questions or concerns. We can certainly see the patient much sooner if necessary.  The total time spent in the appointment was 20 minutes.  Disclaimer: This note was dictated with voice recognition software. Similar sounding words can inadvertently be transcribed and may not be corrected upon review.

## 2024-09-17 ENCOUNTER — Other Ambulatory Visit (INDEPENDENT_AMBULATORY_CARE_PROVIDER_SITE_OTHER): Payer: Self-pay | Admitting: Family Medicine

## 2024-09-17 DIAGNOSIS — Z6834 Body mass index (BMI) 34.0-34.9, adult: Secondary | ICD-10-CM

## 2024-09-18 NOTE — Progress Notes (Unsigned)
 SUBJECTIVE: Discussed the use of AI scribe software for clinical note transcription with the patient, who gave verbal consent to proceed.  Chief Complaint: Obesity  Interim History: She is down 2 lbs since her last visit Down 12 lbs overall TBW loss of 6.3%  Patricia Lin is here to discuss her progress with her obesity treatment plan. She is on the Category 1 Plan and states she is following her eating plan approximately 80 % of the time. She states she is exercising treadmill 45 minutes 5 times per week.  Patricia Lin is a 43 year old female with obesity who presents for follow-up of her obesity treatment plan.  She adheres to her obesity treatment plan approximately 80% of the time, with recent deviations due to a week-[place]ron celebration. She actively tracks her caloric intake and increases her vegetable consumption. She has lost 12 pounds, currently weighing 179 pounds, with a target weight of 166 pounds. Her BMI has decreased from a high of 42 to 32. No nausea, vomiting, diarrhea, or constipation related to Wegovy , which she takes once a week without issues.  She has hypertension and began taking valsartan  a month ago. She missed some doses during recent celebrations.  She has a history of vitamin D  deficiency and is taking 50,000 units once weekly. She experiences fatigue, which she attributes to her vitamin D  deficiency and recent social activities.  She has iron  deficiency anemia and is scheduled for a hysterectomy on October 21st due to iron  loss. She has been receiving iron  infusions, which are proceeding well. She is not pregnant and has no risk of pregnancy as her tubes are tied.  No changes in mood or vision issues related to Wegovy , attributing her vision changes to aging. No cravings and she is forcing herself to eat to maintain a healthy weight loss.  Upcoming: Scheduled for Lap assisted hysterectomy 10/28/24 Pharmacotherapy: wegovy  1.7 mg weekly. - She was Able to refill after  last visit.  OBJECTIVE: Visit Diagnoses: Problem List Items Addressed This Visit     Vitamin D  deficiency   Iron  deficiency anemia   Prediabetes   Class 1 obesity with serious comorbidity and body mass index (BMI) of 34.0 to 34.9 in adult   Relevant Medications   semaglutide -weight management (WEGOVY ) 1.7 MG/0.75ML SOAJ SQ injection   Other Visit Diagnoses       Primary hypertension    -  Primary     Polyphagia         BMI 32.0-32.9,adult Current BMI 32.8          Iron  deficiency anemia Iron  deficiency anemia is managed with iron  infusions. A hysterectomy is scheduled for October 21st to address chronic blood loss with iron  loss due to menstrual bleeding. Post-surgical iron  supplementation may still be necessary. - Proceed with planned hysterectomy on October 21st - Continue iron  infusions as needed  Obesity with polyphagia Obesity management is ongoing with a current BMI of 32, down from a high of 42. She is on Wegovy , which is well-tolerated without side effects such as nausea, vomiting, diarrhea, or constipation. Weight loss is progressing with a 12-pound reduction, and the goal weight is 166 pounds. The current dose of Wegovy  is effective in managing appetite and cravings. Polyphagia well controlled but does report having to force herself to eat at times.  Interested in increasing Wegovy  and we discussed that this may not be helpful at this point as she does not have much appetite at times and may skip meals  which is not going to promote healthy weight loss.  - Continue Wegovy  1.7 mg weekly at current dose - Encourage tracking of calories and consumption of whole foods - Advise against skipping meals - Encourage adequate hydration Meds ordered this encounter  Medications   semaglutide -weight management (WEGOVY ) 1.7 MG/0.75ML SOAJ SQ injection    Sig: Inject 1.7 mg into the skin once a week for 28 days.    Dispense:  3 mL    Refill:  0   Prediabetes Last A1c was 4.5- at  goal. Insulin  16.3- not at goal.   Medication(s): Wegovy  1.7 SQ weekly Denies mass in neck, dysphagia, dyspepsia, persistent hoarseness, abdominal pain, or N/V/Constipation or diarrhea. Has annual eye exam. Mood is stable.   Polyphagia:No Lab Results  Component Value Date   HGBA1C 4.5 (L) 07/06/2024   HGBA1C 5.0 05/09/2024   HGBA1C 5.5 11/25/2023   HGBA1C 5.6 05/08/2023   HGBA1C 5.2 11/11/2022   Lab Results  Component Value Date   INSULIN  16.3 11/25/2023   INSULIN  11.7 11/11/2022   INSULIN  15.1 06/11/2022   INSULIN  11.5 09/08/2018    Plan: Continue and refill Wegovy  1.7 SQ weekly Continue working on nutrition plan to decrease simple carbohydrates, increase lean proteins and exercise to promote weight loss, improve glycemic control and prevent progression to Type 2 diabetes.    Hypertension Hypertension is managed with valsartan  80 mg daily, which has improved blood pressure readings, although they are not yet at goal. She has been non-compliant with medication during a recent period of partying but plans to resume today. Reports no SE with valsartan  prescribed by PCP.   BP Readings from Last 3 Encounters:  09/19/24 134/87  09/05/24 138/86  08/18/24 132/89   Lab Results  Component Value Date   NA 140 07/26/2024   CL 106 07/26/2024   K 4.1 07/26/2024   CO2 20 07/26/2024   BUN 15 07/26/2024   CREATININE 0.69 07/26/2024   EGFR 110 07/26/2024   CALCIUM 9.1 07/26/2024   PHOS 4.0 11/25/2023   ALBUMIN 4.7 07/06/2024   GLUCOSE 77 07/26/2024   Continue to work on nutrition plan to promote weight loss and improve BP control.  - Continue valsartan  80 mg daily  Vitamin D  deficiency Vitamin D  deficiency is managed with 50,000 units of vitamin D  weekly. She reports compliance with this regimen. Last vitamin D  Lab Results  Component Value Date   VD25OH 30.3 11/25/2023   Low vitamin D  levels can be associated with adiposity and may result in leptin resistance and weight  gain. Also associated with fatigue.  Currently on vitamin D  supplementation without any adverse effects such as nausea, vomiting or muscle weakness.  - Continue vitamin D  50,000 units weekly Vitals Temp: 98.6 F (37 C) BP: 134/87 Pulse Rate: 77 SpO2: 100 %   Anthropometric Measurements Height: 5' 2 (1.575 m) Weight: 179 lb (81.2 kg) BMI (Calculated): 32.73 Weight at Last Visit: 181 lb Weight Lost Since Last Visit: 2 lb Weight Gained Since Last Visit: 0 Starting Weight: 191 lb Total Weight Loss (lbs): 12 lb (5.443 kg) Peak Weight: 207 lb   Body Composition  Body Fat %: 37.4 % Fat Mass (lbs): 67 lbs Muscle Mass (lbs): 106.8 lbs Total Body Water (lbs): 70.2 lbs Visceral Fat Rating : 8   Other Clinical Data Fasting: No Labs: No Today's Visit #: 30 Starting Date: 09/08/18     ASSESSMENT AND PLAN:  Diet: Patricia Lin is currently in the action stage of change. As such,  her goal is to continue with weight loss efforts. She has agreed to Category 1 Plan.  Exercise: Patricia Lin has been instructed to work up to a goal of 150 minutes of combined cardio and strengthening exercise per week for weight loss and overall health benefits.   Behavior Modification:  We discussed the following Behavioral Modification Strategies today: increasing lean protein intake, decreasing simple carbohydrates, increasing vegetables, increase H2O intake, increase high fiber foods, meal planning and cooking strategies, avoiding temptations, and planning for success. We discussed various medication options to help Patricia Lin with her weight loss efforts and we both agreed to continue current treatment plan.  Return in about 4 weeks (around 10/17/2024).SABRA She was informed of the importance of frequent follow up visits to maximize her success with intensive lifestyle modifications for her multiple health conditions.  Attestation Statements:   Reviewed by clinician on day of visit: allergies, medications,  problem list, medical history, surgical history, family history, social history, and previous encounter notes.   Time spent on visit including pre-visit chart review and post-visit care and charting was 24 minutes.    Patricia Wiesman, PA-C

## 2024-09-19 ENCOUNTER — Telehealth (INDEPENDENT_AMBULATORY_CARE_PROVIDER_SITE_OTHER): Payer: Self-pay | Admitting: *Deleted

## 2024-09-19 ENCOUNTER — Encounter (INDEPENDENT_AMBULATORY_CARE_PROVIDER_SITE_OTHER): Payer: Self-pay | Admitting: Physician Assistant

## 2024-09-19 ENCOUNTER — Ambulatory Visit (INDEPENDENT_AMBULATORY_CARE_PROVIDER_SITE_OTHER): Admitting: Physician Assistant

## 2024-09-19 VITALS — BP 134/87 | HR 77 | Temp 98.6°F | Ht 62.0 in | Wt 179.0 lb

## 2024-09-19 DIAGNOSIS — I10 Essential (primary) hypertension: Secondary | ICD-10-CM | POA: Diagnosis not present

## 2024-09-19 DIAGNOSIS — R7303 Prediabetes: Secondary | ICD-10-CM | POA: Diagnosis not present

## 2024-09-19 DIAGNOSIS — D509 Iron deficiency anemia, unspecified: Secondary | ICD-10-CM

## 2024-09-19 DIAGNOSIS — R632 Polyphagia: Secondary | ICD-10-CM | POA: Diagnosis not present

## 2024-09-19 DIAGNOSIS — Z6834 Body mass index (BMI) 34.0-34.9, adult: Secondary | ICD-10-CM

## 2024-09-19 DIAGNOSIS — E559 Vitamin D deficiency, unspecified: Secondary | ICD-10-CM | POA: Diagnosis not present

## 2024-09-19 DIAGNOSIS — E66811 Obesity, class 1: Secondary | ICD-10-CM

## 2024-09-19 DIAGNOSIS — Z6832 Body mass index (BMI) 32.0-32.9, adult: Secondary | ICD-10-CM

## 2024-09-19 MED ORDER — SEMAGLUTIDE-WEIGHT MANAGEMENT 1.7 MG/0.75ML ~~LOC~~ SOAJ
1.7000 mg | SUBCUTANEOUS | 0 refills | Status: DC
Start: 2024-09-19 — End: 2024-10-13

## 2024-09-19 NOTE — Telephone Encounter (Signed)
 Message from plan: Your PA request has been approved.  Additional information will be provided in the approval communication. (Message 1145).  Authorization Expiration Date: September 19, 2025.  Patient notified via Mychart.

## 2024-09-19 NOTE — Telephone Encounter (Signed)
 Patricia Lin (Key: [id number])  form thumbnail [employer] has not yet replied to your PA request. Depending on the information you've provided, additional questions may be returned by the plan. You may close this dialog, return to your dashboard, and perform other tasks.  To check for an update later, open this request again from your dashboard.  If [employer] has not replied to your request within 24 hours please contact [employer] at [phone].

## 2024-09-27 NOTE — Progress Notes (Unsigned)
 Cardiology Office Note   Date:  09/28/2024  ID:  Linsey, Arteaga 02/18/1981, MRN [id number] PCP: Lorren Greig PARAS, NP  [facility] Providers Cardiologist:  Vina Gull, MD   History of Present Illness Patricia Lin is a 43 y.o. female with a history of iron  deficiency anemia (menses), preeclampsia, and heart murmur here for follow-up appointment.  Was seen in June 2023 and was referred originally for murmur.  It was not appreciated on exam.  Echo showed normal valve function.  LVEF was normal.  BP was elevated and amlodipine  2.5 mg daily was added.  She was seen again in October 2023 and denied any chest pain.  She did note some fluttering in her chest over the past month.  Episodes occurred about twice a day lasting about 1 minute with some dizziness.  She felt them mostly when she laid down.  Had 2 surgeries canceled due to high blood pressure (dental and hysterectomy) he where her blood pressure was over 190 systolic.  Patient was then started on valsartan  which she has started 3 weeks prior to her last office visit with us .  She is active without any shortness of breath or chest pain with activity.  Today, she presents with a hx of hypertension and hyperlipidemia who presents for cardiovascular evaluation.  She has hypertension with a recent elevated reading of 190/? in August, leading to an increase in valsartan  from 40 mg to 80 mg. Home blood pressure readings are variable, with a recent reading of 140/97. Today, her blood pressure is 112/77, despite feeling tired.  She is on Wegovy  for weight loss and diabetes management. Her LDL cholesterol is 112. Her diet includes beef and baked chicken, with fried foods about once a week. She finds dietary changes challenging.  There are no recent palpitations. A history of a heart murmur was not confirmed in a recent heart ultrasound, which showed a trivial mitral valve leak and a mild tricuspid valve leak. She experiences swelling in her  feet and hands after workouts, with no shortness of breath or chest pain.   Reports no shortness of breath nor dyspnea on exertion. Reports no chest pain, pressure, or tightness. No edema, orthopnea, PND. Reports no palpitations.   Discussed the use of AI scribe software for clinical note transcription with the patient, who gave verbal consent to proceed.  ROS: pertinent ROS in HPI  Studies Reviewed      Echo   July 2023    1. Left ventricular ejection fraction, by estimation, is 60 to 65%. The  left ventricle has normal function. The left ventricle has no regional  wall motion abnormalities. There is mild left ventricular hypertrophy.  Left ventricular diastolic parameters  were normal.   2. Right ventricular systolic function is normal. The right ventricular  size is normal. Tricuspid regurgitation signal is inadequate for assessing  PA pressure.   3. The mitral valve is normal in structure. Trivial mitral valve  regurgitation. No evidence of mitral stenosis.   4. The aortic valve is tricuspid. Aortic valve regurgitation is not  visualized. No aortic stenosis is present.   5. The inferior vena cava is dilated in size with >50% respiratory  variability, suggesting right atrial pressure of 8 mmHg.        Physical Exam VS:  BP 112/77 (BP Location: Left Arm, Patient Position: Sitting, Cuff Size: Large)   Pulse 74   Ht 5' 2 (1.575 m)   Wt 184 lb (83.5 kg)  LMP 06/22/2024   SpO2 99%   BMI 33.65 kg/m        Wt Readings from Last 3 Encounters:  09/28/24 184 lb (83.5 kg)  09/19/24 179 lb (81.2 kg)  09/05/24 183 lb 3.2 oz (83.1 kg)    GEN: Well nourished, well developed in no acute distress NECK: No JVD; No carotid bruits CARDIAC: RRR, + soft systolic murmurs, rubs, gallops RESPIRATORY:  Clear to auscultation without rales, wheezing or rhonchi  ABDOMEN: Soft, non-tender, non-distended EXTREMITIES:  No edema; No deformity   ASSESSMENT AND PLAN  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 Echocardiogram shows trivial mitral and mild tricuspid leakage, no significant calcification, good cardiac function. Mild tricuspid regurgitation requires monitoring. - Monitor for symptoms such as peripheral edema and dyspnea. - Schedule echocardiogram biennially to assess valve function. - Advise hydration and electrolyte replenishment post-exercise.  Essential hypertension Variable blood pressure with elevated diastolic readings at home. On valsartan  80 mg. Diastolic pressure is the primary concern. - Continue valsartan  80 mg. - Track blood pressure for another week. - Contact if diastolic pressure remains in the 90s. - Consider increasing valsartan  to 160 mg if diastolic pressure remains elevated.  Hyperlipidemia LDL cholesterol at 112 mg/dL, above target <899 mg/dL. Dietary habits include infrequent fast food, weekly fried food, preference for beef. - Encourage dietary modifications towards a Mediterranean diet. - Substitute beef with chicken or other lean meats a few times a week.    Dispo: She can follow-up in 6 months with Dr. Okey  Signed, Orren LOISE Fabry, PA-C

## 2024-09-28 ENCOUNTER — Ambulatory Visit: Attending: Cardiology | Admitting: Physician Assistant

## 2024-09-28 ENCOUNTER — Encounter: Payer: Self-pay | Admitting: Physician Assistant

## 2024-09-28 VITALS — BP 112/77 | HR 74 | Ht 62.0 in | Wt 184.0 lb

## 2024-09-28 DIAGNOSIS — E785 Hyperlipidemia, unspecified: Secondary | ICD-10-CM

## 2024-09-28 DIAGNOSIS — R002 Palpitations: Secondary | ICD-10-CM | POA: Diagnosis not present

## 2024-09-28 DIAGNOSIS — I10 Essential (primary) hypertension: Secondary | ICD-10-CM

## 2024-09-28 NOTE — Patient Instructions (Signed)
 Medication Instructions:  Check your blood pressure daily for another week and contact the office with your readings. Depending on your readings will determine if we will increase your Losartan. *If you need a refill on your cardiac medications before your next appointment, please call your pharmacy*  Lab Work: None ordered If you have labs (blood work) drawn today and your tests are completely normal, you will receive your results only by: MyChart Message (if you have MyChart) OR A paper copy in the mail If you have any lab test that is abnormal or we need to change your treatment, we will call you to review the results.  Testing/Procedures: None ordered  Follow-Up: At [facility], you and your health needs are our priority.  As part of our continuing mission to provide you with exceptional heart care, our providers are all part of one team.  This team includes your primary Cardiologist (physician) and Advanced Practice Providers or APPs (Physician Assistants and Nurse Practitioners) who all work together to provide you with the care you need, when you need it.  Your next appointment:   6 month(s)  Provider:   Vina Gull, MD    We recommend signing up for the patient portal called MyChart.  Sign up information is provided on this After Visit Summary.  MyChart is used to connect with patients for Virtual Visits (Telemedicine).  Patients are able to view lab/test results, encounter notes, upcoming appointments, etc.  Non-urgent messages can be sent to your provider as well.   To learn more about what you can do with MyChart, go to [email or web address].   Other Instructions

## 2024-09-29 ENCOUNTER — Telehealth: Payer: Self-pay

## 2024-09-29 ENCOUNTER — Other Ambulatory Visit: Payer: Self-pay | Admitting: Internal Medicine

## 2024-09-29 NOTE — Telephone Encounter (Signed)
 S/w patient regarding iron  infusions. Patient aware that Dr. Sherrod has planned for her to receive additional iron  infusions prior to her planned hysterectomy.  Provider aware and will place appropriate treatment plan for patient to receive infusions at [facility]. Patient aware that someone will be in touch from the scheduling department to arrange the appointments.  Patient verbalized an understanding of the information.

## 2024-09-30 ENCOUNTER — Telehealth: Payer: Self-pay | Admitting: Pharmacy Technician

## 2024-09-30 ENCOUNTER — Other Ambulatory Visit: Payer: Self-pay | Admitting: Internal Medicine

## 2024-09-30 NOTE — Telephone Encounter (Signed)
 Auth Submission: NO AUTH NEEDED Site of care: Site of care: [facility] Payer: [employer] Medication & CPT/J Code(s) submitted: Venofer  (Iron  Sucrose) J1756 Diagnosis Code:  Route of submission (phone, fax, portal):  Phone # Fax # Auth type: Buy/Bill PB Units/visits requested: 3 DOSES Reference number:  Approval from: 09/30/24 to 10/07/24

## 2024-09-30 NOTE — Telephone Encounter (Signed)
 Auth Submission: NO AUTH NEEDED Site of care: Site of care: [facility] Payer: [employer] Medication & CPT/J Code(s) submitted: Venofer  (Iron  Sucrose) J1756 Diagnosis Code:  Route of submission (phone, fax, portal):  Phone # Fax # Auth type: Buy/Bill PB Units/visits requested: 3 DOSES Reference number:  Approval from: 09/30/24 to 12/07/24

## 2024-10-04 ENCOUNTER — Encounter: Payer: Self-pay | Admitting: Family

## 2024-10-04 ENCOUNTER — Ambulatory Visit (INDEPENDENT_AMBULATORY_CARE_PROVIDER_SITE_OTHER): Admitting: Family

## 2024-10-04 VITALS — BP 140/87 | HR 90 | Temp 98.2°F | Resp 16 | Ht 62.0 in | Wt 180.6 lb

## 2024-10-04 DIAGNOSIS — I10 Essential (primary) hypertension: Secondary | ICD-10-CM | POA: Diagnosis not present

## 2024-10-04 DIAGNOSIS — Z01818 Encounter for other preprocedural examination: Secondary | ICD-10-CM

## 2024-10-04 NOTE — Progress Notes (Signed)
 Patient needs surgery clearance for hypertension

## 2024-10-04 NOTE — Progress Notes (Signed)
 Patient ID: Patricia Lin, female    DOB: 07-Mar-1981  MRN: [id number]  CC: Blood Pressure Check  Subjective: Patricia Lin is a 43 y.o. female who presents for blood pressure check.  Her concerns today include:  Patient reports she is scheduled for a hysterectomy and needs surgical clearance for blood pressure. She was recently seen on 09/28/2024 at [facility] [employer] for essential hypertension and additional diagnosis. During the same office visit patient was instructed to track blood pressure for another week and to follow-up if blood pressure remaining elevated. Today patient states she is doing well on Valsartan  80 mg, no issues/concerns. She does not complain of red flag symptoms such as but not limited to chest pain, shortness of breath, worst headache of life, nausea/vomiting. States she is anemic due to needing a hysterectomy.  Patient Active Problem List   Diagnosis Date Noted   Panniculitis 06/29/2024   Back pain 06/29/2024   Other insomnia 07/01/2023   BMI 35.0-35.9,adult 01/22/2023   Obesity, Beginning BMI 34.93 01/22/2023   Binge-Eating Disorder, Mild 09/29/2022   Prediabetes 08/12/2022   Depression 08/12/2022   Class 1 obesity with serious comorbidity and body mass index (BMI) of 34.0 to 34.9 in adult 08/12/2022   Iron  deficiency anemia 07/29/2022   Insulin  resistance 06/28/2022   Essential hypertension 06/28/2022   Iron  deficiency 06/28/2022   Vitamin D  deficiency 06/28/2022   Other hyperlipidemia 06/28/2022   At risk of diabetes mellitus 06/28/2022   Iron  deficiency anemia due to chronic blood loss 05/07/2022   Right lower quadrant abdominal pain 01/28/2019   Generalized headaches 12/17/2018   Anxiety 12/17/2018   History of anemia 05/26/2018   Anemia 12/05/2017   Routine health maintenance 03/03/2013   S/P tubal ligation 02/09/2013   Vaginal delivery 01/09/2013   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   Fracture of left ulna 09/27/12  10/23/2012   Cervical funneling 09/18/2012   Cervical shortening 09/18/2012   Symptomatic anemia 09/03/2012   H/O pre-eclampsia in prior pregnancy, currently pregnant 08/19/2012   Chronic hypertension in pregnancy 08/19/2012   Hx LEEP (loop electrosurgical excision procedure), cervix, pregnancy 08/19/2012   Hx of postpartum depression, currently pregnant 08/19/2012   Late prenatal care 08/19/2012   Hx of precipitous labor and deliveries, antepartum 08/19/2012   Muscle tension HAs 08/19/2012     Current Outpatient Medications on File Prior to Visit  Medication Sig Dispense Refill   butalbital -acetaminophen -caffeine  (FIORICET) 50-325-40 MG tablet      clotrimazole -betamethasone  (LOTRISONE ) cream      diazepam (VALIUM) 10 MG tablet Take 10 mg by mouth every 6 (six) hours as needed for anxiety.     ferrous sulfate  325 (65 FE) MG tablet TAKE 1 TABLET BY MOUTH 3 TIMES DAILY WITH MEALS. 90 tablet 3   ibuprofen  (ADVIL ) 600 MG tablet      Insulin  Pen Needle (BD PEN NEEDLE NANO 2ND GEN) 32G X 4 MM MISC Use 1 needle daily to inject medication. 100 each 0   Iron , Ferrous Sulfate , 325 (65 Fe) MG TABS Take 325 mg by mouth daily. 90 tablet 0   Iron , Ferrous Sulfate , 325 (65 Fe) MG TABS Take 325 mg by mouth daily. 90 tablet 0   meloxicam  (MOBIC ) 7.5 MG tablet Take 1 tablet (7.5 mg total) by mouth daily. 90 tablet 0   metoCLOPramide  (REGLAN ) 10 MG tablet      nystatin -triamcinolone  ointment (MYCOLOG) Apply 1 Application topically 2 (two) times  daily. 60 g 2   semaglutide -weight management (WEGOVY ) 1.7 MG/0.75ML SOAJ SQ injection Inject 1.7 mg into the skin once a week for 28 days. 3 mL 0   valsartan  (DIOVAN ) 80 MG tablet Take 1 tablet (80 mg total) by mouth daily. 90 tablet 2   Vitamin D , Ergocalciferol , (DRISDOL ) 1.25 MG (50000 UNIT) CAPS capsule Take 1 capsule (50,000 Units total) by mouth every 7 (seven) days. 4 capsule 0   Current Facility-Administered Medications on File Prior to Visit  Medication  Dose Route Frequency Provider Last Rate Last Admin   triamcinolone  acetonide (KENALOG -40) injection 40 mg  40 mg Intramuscular Once Lorren Greig PARAS, NP        Allergies  Allergen Reactions   Latex Itching and Rash    Social History   Socioeconomic History   Marital status: Married    Spouse name: Ardean Barefoot   Number of children: 3   Years of education: 13   Highest education level: Master's degree (e.g., MA, MS, MEng, MEd, MSW, MBA)  Occupational History   Occupation: Physiological Scientist: [employer]   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  Tobacco Use   Smoking status: Never   Smokeless tobacco: Never  Vaping Use   Vaping status: Never Used  Substance and Sexual Activity   Alcohol  use: Yes    Comment: socially   Drug use: No   Sexual activity: Yes    Partners: Male    Birth control/protection: Surgical  Other Topics Concern   Not on file  Social History Narrative   Not on file   Social Drivers of Health   Financial Resource Strain: Medium Risk (10/03/2024)   Overall Financial Resource Strain (CARDIA)    Difficulty of Paying Living Expenses: Somewhat hard  Food Insecurity: Food Insecurity Present (10/03/2024)   Hunger Vital Sign    Worried About Running Out of Food in the Last Year: Sometimes true    Ran Out of Food in the Last Year: Sometimes true  Transportation Needs: No Transportation Needs (10/03/2024)   PRAPARE - Administrator, Civil Service (Medical): No    Lack of Transportation (Non-Medical): No  Physical Activity: Sufficiently Active (10/03/2024)   Exercise Vital Sign    Days of Exercise per Week: 4 days    Minutes of Exercise per Session: 40 min  Stress: No Stress Concern Present (10/03/2024)   [employer] of Occupational Health - Occupational Stress Questionnaire    Feeling of Stress: Only a little  Social Connections: Unknown (10/03/2024)   Social Connection and Isolation Panel    Frequency of Communication with  Friends and Family: More than three times a week    Frequency of Social Gatherings with Friends and Family: Once a week    Attends Religious Services: 1 to 4 times per year    Active Member of [employer] or Organizations: No    Attends Banker Meetings: Not on file    Marital Status: Not on file  Intimate Partner Violence: Not At Risk (05/09/2024)   Humiliation, Afraid, Rape, and Kick questionnaire    Fear of Current or Ex-Partner: No    Emotionally Abused: No    Physically Abused: No    Sexually Abused: No    Family History  Problem Relation Age of Onset   Hypertension Mother    Hyperlipidemia Mother    Coronary artery disease Mother    Heart disease Mother  high cholesterol   Asthma Mother    Diabetes Mother    Depression Mother    Anxiety disorder Mother    Sleep apnea Mother    Obesity Mother    Eating disorder Mother    Hypertension Father    High Cholesterol Father    Heart disease Father    Hypertension Maternal Grandmother    Hypertension Maternal Grandfather    Asthma Daughter    Alcohol  abuse Maternal Uncle    Drug abuse Maternal Uncle     Past Surgical History:  Procedure Laterality Date   BREAST REDUCTION SURGERY  12/08/2006   BREAST SURGERY     CERVICAL BIOPSY  W/ LOOP ELECTRODE EXCISION  2010   DILATION AND CURETTAGE OF UTERUS     FINGER SURGERY     Right index finger   TUBAL LIGATION  01/09/2013   Procedure: POST PARTUM TUBAL LIGATION;  Surgeon: Ovid Patricia All, MD;  Location: [facility];  Service: Gynecology;  Laterality: Bilateral;  post partum tubal ligation bilateral   US  ECHOCARDIOGRAPHY  06/18/2009   ef 55-60%   WISDOM TOOTH EXTRACTION      ROS: Review of Systems Negative except as stated above  PHYSICAL EXAM: BP (!) 140/87   Pulse 90   Temp 98.2 F (36.8 C) (Oral)   Resp 16   Ht 5' 2 (1.575 m)   Wt 180 lb 9.6 oz (81.9 kg)   LMP 10/01/2024 (Exact Date)   SpO2 98%   Breastfeeding No   BMI 33.03 kg/m   Physical  Exam HENT:     Head: Normocephalic and atraumatic.     Nose: Nose normal.     Mouth/Throat:     Mouth: Mucous membranes are moist.     Pharynx: Oropharynx is clear.  Eyes:     Extraocular Movements: Extraocular movements intact.     Conjunctiva/sclera: Conjunctivae normal.     Pupils: Pupils are equal, round, and reactive to light.  Cardiovascular:     Rate and Rhythm: Normal rate and regular rhythm.     Pulses: Normal pulses.     Heart sounds: Normal heart sounds.  Pulmonary:     Effort: Pulmonary effort is normal.     Breath sounds: Normal breath sounds.  Musculoskeletal:        General: Normal range of motion.     Cervical back: Normal range of motion and neck supple.  Neurological:     General: No focal deficit present.     Mental Status: She is alert and oriented to person, place, and time.  Psychiatric:        Mood and Affect: Mood normal.        Behavior: Behavior normal.     ASSESSMENT AND PLAN: 1. Preoperative clearance (Primary) 2. Primary hypertension - Blood pressure not at goal during today's visit. Patient asymptomatic without chest pressure, chest pain, palpitations, shortness of breath, worst headache of life, and any additional red flag symptoms. - Continue present management.  - Routine screening.  - I discussed with patient in detail Cardiology will need to provide preoperative clearance. Patient verbalized understanding/agreement.  - Hemoglobin A1c - CBC - CMP14+EGFR   Patient was given the opportunity to ask questions.  Patient verbalized understanding of the plan and was able to repeat key elements of the plan. Patient was given clear instructions to go to Emergency Department or return to medical center if symptoms don't improve, worsen, or new problems develop.The patient verbalized understanding.   Orders  Placed This Encounter  Procedures   Hemoglobin A1c   CBC   CMP14+EGFR    Return in about 4 weeks (around 11/01/2024) for Follow-Up or  next available chronic conditions.  Greig JINNY Drones, NP

## 2024-10-05 ENCOUNTER — Ambulatory Visit: Payer: Self-pay | Admitting: Family

## 2024-10-05 ENCOUNTER — Telehealth (HOSPITAL_BASED_OUTPATIENT_CLINIC_OR_DEPARTMENT_OTHER): Payer: Self-pay

## 2024-10-05 LAB — HEMOGLOBIN A1C
Est. average glucose Bld gHb Est-mCnc: 103 mg/dL
Hgb A1c MFr Bld: 5.2 % (ref 4.8–5.6)

## 2024-10-05 LAB — CBC
Hematocrit: 29.6 % — ABNORMAL LOW (ref 34.0–46.6)
Hemoglobin: 8.7 g/dL — ABNORMAL LOW (ref 11.1–15.9)
MCH: 23.8 pg — ABNORMAL LOW (ref 26.6–33.0)
MCHC: 29.4 g/dL — ABNORMAL LOW (ref 31.5–35.7)
MCV: 81 fL (ref 79–97)
Platelets: 362 x10E3/uL (ref 150–450)
RBC: 3.66 x10E6/uL — ABNORMAL LOW (ref 3.77–5.28)
RDW: 14.8 % (ref 11.7–15.4)
WBC: 4.5 x10E3/uL (ref 3.4–10.8)

## 2024-10-05 LAB — CMP14+EGFR
ALT: 25 IU/L (ref 0–32)
AST: 22 IU/L (ref 0–40)
Albumin: 4.4 g/dL (ref 3.9–4.9)
Alkaline Phosphatase: 73 IU/L (ref 41–116)
BUN/Creatinine Ratio: 24 — ABNORMAL HIGH (ref 9–23)
BUN: 17 mg/dL (ref 6–24)
Bilirubin Total: 0.5 mg/dL (ref 0.0–1.2)
CO2: 21 mmol/L (ref 20–29)
Calcium: 9.3 mg/dL (ref 8.7–10.2)
Chloride: 104 mmol/L (ref 96–106)
Creatinine, Ser: 0.72 mg/dL (ref 0.57–1.00)
Globulin, Total: 2.6 g/dL (ref 1.5–4.5)
Glucose: 88 mg/dL (ref 70–99)
Potassium: 4.4 mmol/L (ref 3.5–5.2)
Sodium: 140 mmol/L (ref 134–144)
Total Protein: 7 g/dL (ref 6.0–8.5)
eGFR: 106 mL/min/1.73 (ref >59)

## 2024-10-05 NOTE — Telephone Encounter (Signed)
   Pre-operative Risk Assessment    Patient Name: Patricia Lin  DOB: 1981/05/18 MRN: [id number]   Date of last office visit: 09/28/2024 - Orren Fabry, PA-C Date of next office visit: N/A   Request for Surgical Clearance    Procedure:  Laparoscopic Assisted Vaginal Hysterectomy with a possible Total Abdominal Hysterectomy   Date of Surgery:  Clearance 10/28/24                                 Surgeon:  Dr. Jon Rummer Surgeon's Group or Practice Name:  [facility] Phone number:  [phone]  Fax number:  [phone]   Type of Clearance Requested:   - Medical    Type of Anesthesia:  General    Additional requests/questions:  She will need to be cleared for her heart condition with higg blood pressure, as the procedure requires anesthesia. If you have any questions or need additional information, please feel free to contact our office or speak directly with Dr. Rummer at [phone] ext:[phone]  Signed, Patrcia Hong L   10/05/2024, 1:54 PM  -

## 2024-10-05 NOTE — Telephone Encounter (Signed)
 Orren,  You saw this patient on 09/28/2024. Per protocol we request that you comment on his cardiac risk to proceed with Laparoscopic Assisted Vaginal Hysterectomy with a possible Total Abdominal Hysterectomy  on 10/28/2024,since it has been less than 2 months since evaluated in the office. Please send your comment to P CV Pre-Op Pool.  Thank you, Lamarr Satterfield DNP, ANP, AACC.

## 2024-10-11 ENCOUNTER — Ambulatory Visit: Admitting: *Deleted

## 2024-10-11 VITALS — BP 143/93 | HR 79 | Temp 98.1°F | Resp 16 | Ht 62.0 in | Wt 184.6 lb

## 2024-10-11 DIAGNOSIS — D500 Iron deficiency anemia secondary to blood loss (chronic): Secondary | ICD-10-CM | POA: Diagnosis not present

## 2024-10-11 MED ORDER — SODIUM CHLORIDE 0.9 % IV SOLN
300.0000 mg | Freq: Once | INTRAVENOUS | Status: AC
  Administered 2024-10-11: 300 mg via INTRAVENOUS
  Filled 2024-10-11: qty 15

## 2024-10-11 NOTE — Progress Notes (Signed)
 Diagnosis: Iron  Deficiency Anemia  Provider:  Mannam, Praveen MD  Procedure: IV Infusion  IV Type: Peripheral, IV Location: R Antecubital  Venofer  (Iron  Sucrose), Dose: 300 mg  Infusion Start Time: 1404 pm  Infusion Stop Time: 1555 pm  Post Infusion IV Care: Observation period completed and Peripheral IV Discontinued  Discharge: Condition: Good, Destination: Home . AVS Declined  Performed by:  Trudy Lamarr LABOR, RN

## 2024-10-12 ENCOUNTER — Ambulatory Visit: Admitting: Family

## 2024-10-12 ENCOUNTER — Other Ambulatory Visit: Payer: Self-pay | Admitting: Obstetrics and Gynecology

## 2024-10-13 ENCOUNTER — Ambulatory Visit (INDEPENDENT_AMBULATORY_CARE_PROVIDER_SITE_OTHER): Payer: Self-pay | Admitting: Family Medicine

## 2024-10-13 ENCOUNTER — Encounter (INDEPENDENT_AMBULATORY_CARE_PROVIDER_SITE_OTHER): Payer: Self-pay | Admitting: Family Medicine

## 2024-10-13 VITALS — BP 130/85 | HR 81 | Temp 97.9°F | Ht 62.0 in | Wt 179.0 lb

## 2024-10-13 DIAGNOSIS — E66811 Obesity, class 1: Secondary | ICD-10-CM | POA: Diagnosis not present

## 2024-10-13 DIAGNOSIS — R632 Polyphagia: Secondary | ICD-10-CM

## 2024-10-13 DIAGNOSIS — Z6832 Body mass index (BMI) 32.0-32.9, adult: Secondary | ICD-10-CM

## 2024-10-13 DIAGNOSIS — E559 Vitamin D deficiency, unspecified: Secondary | ICD-10-CM | POA: Diagnosis not present

## 2024-10-13 DIAGNOSIS — E669 Obesity, unspecified: Secondary | ICD-10-CM

## 2024-10-13 MED ORDER — VITAMIN D (ERGOCALCIFEROL) 1.25 MG (50000 UNIT) PO CAPS: 50000.0000 [IU] | ORAL_CAPSULE | ORAL | 0 refills | Status: DC

## 2024-10-13 MED ORDER — SEMAGLUTIDE-WEIGHT MANAGEMENT 1.7 MG/0.75ML ~~LOC~~ SOAJ: 1.7000 mg | SUBCUTANEOUS | 0 refills | Status: DC

## 2024-10-13 NOTE — Progress Notes (Signed)
 Office: [phone]  /  Fax: [phone]  WEIGHT SUMMARY AND BIOMETRICS  Anthropometric Measurements Height: 5' 2 (1.575 m) Weight: 179 lb (81.2 kg) BMI (Calculated): 32.73 Weight at Last Visit: 179 lb Weight Lost Since Last Visit: 0 Weight Gained Since Last Visit: 0 Starting Weight: 191 lb Total Weight Loss (lbs): 12 lb (5.443 kg) Peak Weight: 207 lb   Body Composition  Body Fat %: 37.4 % Fat Mass (lbs): 67.2 lbs Muscle Mass (lbs): 106.8 lbs Total Body Water (lbs): 70.4 lbs Visceral Fat Rating : 8   Other Clinical Data Fasting: no Labs: no Today's Visit #: 31 Starting Date: 09/26/18    Chief Complaint: OBESITY   History of Present Illness Patricia Lin is a 43 year old female with obesity and vitamin D  deficiency who presents for obesity treatment plan assessment and progress evaluation.  She is adhering to the category one eating plan approximately 80% of the time. She is not currently engaging in any exercise but is attempting to consume the recommended amount of protein and increase her intake of fruits and vegetables. She is not skipping meals and is trying to get 7 to 9 hours of sleep per night. Her weight has remained stable over the past three weeks since her last visit.  She is being treated for vitamin D  deficiency with prescription ergocalciferol  at a dose of 50,000 international units weekly and requests a refill. She is also on Wegovy  1.7 mg for polyphagia and requests a refill for this medication.  She is preparing for an upcoming hysterectomy scheduled for November 21st and anticipates a recovery period of four to six weeks post-surgery. She has concerns about the surgery due to a past incident involving a coworker who passed away following a hysterectomy, but her high blood pressure, which previously delayed the surgery, is now under control.  She is part of a family of six and is the primary planner for Thanksgiving gatherings. This year, due to her  upcoming surgery, the family will be doing a potluck style gathering at her house.      PHYSICAL EXAM:  Blood pressure 130/85, pulse 81, temperature 97.9 F (36.6 C), height 5' 2 (1.575 m), weight 179 lb (81.2 kg), last menstrual period 10/01/2024, SpO2 100%. Body mass index is 32.74 kg/m.  DIAGNOSTIC DATA REVIEWED:  BMET    Component Value Date/Time   NA 140 10/04/2024 0835   K 4.4 10/04/2024 0835   CL 104 10/04/2024 0835   CO2 21 10/04/2024 0835   GLUCOSE 88 10/04/2024 0835   GLUCOSE 94 06/06/2024 0846   BUN 17 10/04/2024 0835   CREATININE 0.72 10/04/2024 0835   CREATININE 0.71 06/06/2024 0846   CREATININE 0.64 01/17/2013 1720   CALCIUM 9.3 10/04/2024 0835   GFRNONAA >60 06/06/2024 0846   GFRAA >60 09/04/2019 0130   Lab Results  Component Value Date   HGBA1C 5.2 10/04/2024   HGBA1C 5.2 09/08/2018   Lab Results  Component Value Date   INSULIN  16.3 11/25/2023   INSULIN  11.5 09/08/2018   Lab Results  Component Value Date   TSH 0.918 05/09/2024   CBC    Component Value Date/Time   WBC 4.5 10/04/2024 0835   WBC 3.5 (L) 09/05/2024 0814   WBC 3.9 (L) 07/07/2023 1458   RBC 3.66 (L) 10/04/2024 0835   RBC 3.70 (L) 09/05/2024 0814   HGB 8.7 (L) 10/04/2024 0835   HCT 29.6 (L) 10/04/2024 0835   PLT 362 10/04/2024 0835   MCV 81  10/04/2024 0835   MCH 23.8 (L) 10/04/2024 0835   MCH 24.6 (L) 09/05/2024 0814   MCHC 29.4 (L) 10/04/2024 0835   MCHC 31.2 09/05/2024 0814   RDW 14.8 10/04/2024 0835   Iron  Studies    Component Value Date/Time   IRON  18 (L) 09/05/2024 0814   IRON  13 (L) 05/09/2024 1102   TIBC 365 09/05/2024 0814   TIBC 398 05/09/2024 1102   FERRITIN 9 (L) 09/05/2024 0815   FERRITIN 5 (L) 05/09/2024 1102   IRONPCTSAT 5 (L) 09/05/2024 0814   IRONPCTSAT 3 (LL) 05/09/2024 1102   Lipid Panel     Component Value Date/Time   CHOL 199 05/09/2024 1102   TRIG 67 05/09/2024 1102   HDL 75 05/09/2024 1102   CHOLHDL 2.7 05/09/2024 1102   LDLCALC 112 (H)  05/09/2024 1102   Hepatic Function Panel     Component Value Date/Time   PROT 7.0 10/04/2024 0835   ALBUMIN 4.4 10/04/2024 0835   AST 22 10/04/2024 0835   AST 15 06/06/2024 0846   ALT 25 10/04/2024 0835   ALT 16 06/06/2024 0846   ALKPHOS 73 10/04/2024 0835   BILITOT 0.5 10/04/2024 0835   BILITOT 0.5 06/06/2024 0846      Component Value Date/Time   TSH 0.918 05/09/2024 1102   Nutritional Lab Results  Component Value Date   VD25OH 30.3 11/25/2023   VD25OH 23.2 (L) 11/11/2022   VD25OH 22.2 (L) 06/11/2022     Assessment and Plan Assessment & Plan Obesity, class 1 with polyphagia Obesity, class 1, with polyphagia. She is following the category one eating plan 80% of the time, not exercising, and maintaining weight over the last three weeks. She is on Wegovy  1.7 mg for polyphagia and plans to stop it before her upcoming hysterectomy on November 21st due to potential queasiness post-surgery. She is advised to resume Wegovy  once her appetite returns post-surgery. - Continue category one eating plan with portion control. - Stop Wegovy  before surgery and resume once appetite returns post-surgery. - Prepare protein shakes for post-surgery recovery. - Avoid exercise until cleared post-surgery.  Vitamin D  deficiency Managed with prescription ergocalciferol  50,000 IU weekly. She requests a refill. - Sent prescription for ergocalciferol  50,000 IU weekly to pharmacy.     Patricia Lin was counseled on the importance of maintaining healthy lifestyle habits, including balanced nutrition, regular physical activity, and behavioral modifications, while taking antiobesity medication.  Patient verbalized understanding that medication is an adjunct to, not a replacement for, lifestyle changes and that the Paske-term success and weight maintenance depend on continued adherence to these strategies.   Patricia Lin was informed of the importance of frequent follow up visits to maximize her success with intensive  lifestyle modifications for her obesity and obesity related health conditions as recommended by USPSTF and CMS guidelines   Louann Penton, MD

## 2024-10-18 ENCOUNTER — Ambulatory Visit

## 2024-10-18 VITALS — BP 132/87 | HR 84 | Temp 97.8°F | Resp 18 | Ht 62.0 in | Wt 186.4 lb

## 2024-10-18 DIAGNOSIS — D500 Iron deficiency anemia secondary to blood loss (chronic): Secondary | ICD-10-CM

## 2024-10-18 MED ORDER — SODIUM CHLORIDE 0.9 % IV SOLN
300.0000 mg | Freq: Once | INTRAVENOUS | Status: AC
  Administered 2024-10-18: 300 mg via INTRAVENOUS
  Filled 2024-10-18: qty 15

## 2024-10-18 NOTE — Progress Notes (Signed)
 Diagnosis: Iron  Deficiency Anemia  Provider:  Praveen Mannam MD  Procedure: IV Infusion  IV Type: Peripheral, IV Location: L Antecubital  Venofer  (Iron  Sucrose), Dose: 300 mg  Infusion Start Time: 0842  Infusion Stop Time: 1021  Post Infusion IV Care: Patient declined observation and Peripheral IV Discontinued  Discharge: Condition: Good, Destination: Home . AVS Declined  Performed by:  Emmalin Jaquess, RN

## 2024-10-19 NOTE — Telephone Encounter (Signed)
   Patient Name: Patricia Lin  DOB: 1981/07/31 MRN: [id number]  Primary Cardiologist: Vina Gull, MD  Chart reviewed as part of pre-operative protocol coverage.   This patient recently saw me in the office on 09/28/2024.  At that time her biggest issue was with hypertension.  Blood pressure was well-controlled at that office visit.  Otherwise, echocardiogram which was done in September was reviewed with the patient at the time of office visit and was unremarkable.  Based on my recent evaluation with the assumption that her cardiac status has not changed, the patient would be at acceptable risk to undergo upcoming surgery without any further cardiovascular testing.  Will route this bundled recommendation to requesting provider via Epic fax function and remove from pre-op pool. Please call with questions.  Orren LOISE Fabry, PA-C 10/19/2024, 10:38 AM

## 2024-10-19 NOTE — Telephone Encounter (Signed)
 Assisting in preop today. Chart routed 10/29, will re-route to Tessa in case message cleared out. Orren, please see Kathryn's note below and reply ot P CV DIV PREOP. Thank you!

## 2024-10-20 ENCOUNTER — Inpatient Hospital Stay: Attending: Internal Medicine

## 2024-10-20 ENCOUNTER — Inpatient Hospital Stay (HOSPITAL_BASED_OUTPATIENT_CLINIC_OR_DEPARTMENT_OTHER): Admitting: Internal Medicine

## 2024-10-20 VITALS — BP 138/93 | HR 73 | Temp 97.8°F | Resp 17 | Ht 62.0 in | Wt 180.0 lb

## 2024-10-20 DIAGNOSIS — N920 Excessive and frequent menstruation with regular cycle: Secondary | ICD-10-CM | POA: Insufficient documentation

## 2024-10-20 DIAGNOSIS — D500 Iron deficiency anemia secondary to blood loss (chronic): Secondary | ICD-10-CM

## 2024-10-20 DIAGNOSIS — D649 Anemia, unspecified: Secondary | ICD-10-CM

## 2024-10-20 LAB — CBC WITH DIFFERENTIAL (CANCER CENTER ONLY)
Abs Immature Granulocytes: 0 K/uL (ref 0.00–0.07)
Basophils Absolute: 0 K/uL (ref 0.0–0.1)
Basophils Relative: 1 %
Eosinophils Absolute: 0 K/uL (ref 0.0–0.5)
Eosinophils Relative: 1 %
HCT: 33.7 % — ABNORMAL LOW (ref 36.0–46.0)
Hemoglobin: 10.2 g/dL — ABNORMAL LOW (ref 12.0–15.0)
Immature Granulocytes: 0 %
Lymphocytes Relative: 33 %
Lymphs Abs: 1.2 K/uL (ref 0.7–4.0)
MCH: 23.8 pg — ABNORMAL LOW (ref 26.0–34.0)
MCHC: 30.3 g/dL (ref 30.0–36.0)
MCV: 78.7 fL — ABNORMAL LOW (ref 80.0–100.0)
Monocytes Absolute: 0.3 K/uL (ref 0.1–1.0)
Monocytes Relative: 9 %
Neutro Abs: 2.1 K/uL (ref 1.7–7.7)
Neutrophils Relative %: 56 %
Platelet Count: 299 K/uL (ref 150–400)
RBC: 4.28 MIL/uL (ref 3.87–5.11)
RDW: 18.5 % — ABNORMAL HIGH (ref 11.5–15.5)
WBC Count: 3.6 K/uL — ABNORMAL LOW (ref 4.0–10.5)
nRBC: 0.5 % — ABNORMAL HIGH (ref 0.0–0.2)

## 2024-10-20 LAB — FERRITIN: Ferritin: 299 ng/mL (ref 11–307)

## 2024-10-20 LAB — IRON AND IRON BINDING CAPACITY (CC-WL,HP ONLY)
Iron: 54 ug/dL (ref 28–170)
Saturation Ratios: 15 % (ref 10.4–31.8)
TIBC: 365 ug/dL (ref 250–450)
UIBC: 311 ug/dL (ref 148–442)

## 2024-10-20 NOTE — Progress Notes (Signed)
 [facility] [phone]   [phone]  OFFICE PROGRESS NOTE  Jaycee Greig PARAS, NP [address] [place] [address]  DIAGNOSIS: Iron  deficiency anemia secondary to menorrhagia.  PRIOR THERAPY: Ferrous sulfate  325 mg p.o. 3 times daily with no improvement.  CURRENT THERAPY: Venofer  300 Mg IV weekly for 3 weeks.  Last iron  infusion in November 2025 INTERVAL HISTORY: Patricia Lin 43 y.o. female returns to the clinic today for follow-up visit. Discussed the use of AI scribe software for clinical note transcription with the patient, who gave verbal consent to proceed.  History of Present Illness Patricia Lin is a 43 year old female with iron  deficiency anemia secondary to menorrhagia who presents for evaluation and repeat blood work.  She has been experiencing fatigue for about a week, which began after her most recent iron  infusion earlier this week. She is currently receiving iron  infusions with Venofer  on an as-needed basis and has completed two infusions so far, with one more pending.  Her hemoglobin level has improved from 8.7 g/dL on October 28 to 89.7 g/dL currently. She is scheduled for a hysterectomy on November 21.    MEDICAL HISTORY: Past Medical History:  Diagnosis Date   Abnormal Pap smear    Adenomyosis    Anemia    Anxiety    Back pain    BV (bacterial vaginosis) 2011   Chlamydia infection    CIN I (cervical intraepithelial neoplasia I) 2008   Depression    Dysmenorrhea    Dyspnea    Dysrhythmia 2009   TACHYCARDIA;   HAD W/U 2010? 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  B/P nl, and 122/76 on repeat-sitting   Postpartum depression 2011   NO MEDS   Pregnancy induced hypertension    ALL PREGNANCIES   Shortness of breath    WITH EXERTION SICNE PREGNANCY   Tachycardia    Tachycardia 2010   HAS HAD WORKUP. UNSURE IF HAS POSSIBLE HEART VALVE ISSUE   Vulvitis 2009    ALLERGIES:  is allergic to latex.  MEDICATIONS:  Current Outpatient Medications  Medication Sig Dispense Refill   butalbital -acetaminophen -caffeine  (FIORICET) 50-325-40 MG tablet      clotrimazole -betamethasone  (LOTRISONE ) cream      diazepam (VALIUM) 10 MG tablet Take 10 mg by mouth every 6 (six) hours as needed for anxiety.     ferrous sulfate  325 (65 FE) MG tablet TAKE 1 TABLET BY MOUTH 3 TIMES DAILY WITH MEALS. 90 tablet 3   ibuprofen  (ADVIL ) 600 MG tablet      Insulin  Pen Needle (BD PEN NEEDLE NANO 2ND GEN) 32G X 4  MM MISC Use 1 needle daily to inject medication. 100 each 0   Iron , Ferrous Sulfate , 325 (65 Fe) MG TABS Take 325 mg by mouth daily. 90 tablet 0   Iron , Ferrous Sulfate , 325 (65 Fe) MG TABS Take 325 mg by mouth daily. 90 tablet 0   meloxicam  (MOBIC ) 7.5 MG tablet Take 1 tablet (7.5 mg total) by mouth daily. 90 tablet 0   metoCLOPramide  (REGLAN ) 10 MG tablet      nystatin -triamcinolone  ointment (MYCOLOG) Apply 1 Application topically 2 (two) times daily. 60 g 2   semaglutide -weight management (WEGOVY ) 1.7 MG/0.75ML SOAJ SQ injection Inject 1.7 mg into the skin once a week for 28 days. 3 mL 0   valsartan  (DIOVAN ) 80 MG tablet Take 1 tablet (80 mg total) by mouth daily. 90 tablet 2   Vitamin D , Ergocalciferol , (DRISDOL ) 1.25 MG (50000 UNIT) CAPS capsule Take 1 capsule (50,000 Units total)  by mouth every 7 (seven) days. 4 capsule 0   Current Facility-Administered Medications  Medication Dose Route Frequency Provider Last Rate Last Admin   triamcinolone  acetonide (KENALOG -40) injection 40 mg  40 mg Intramuscular Once Jaycee Greig PARAS, NP        SURGICAL HISTORY:  Past Surgical History:  Procedure Laterality Date   BREAST REDUCTION SURGERY  12/08/2006   BREAST SURGERY     CERVICAL BIOPSY  W/ LOOP ELECTRODE EXCISION  2010   DILATION AND CURETTAGE OF UTERUS     FINGER SURGERY     Right index finger   TUBAL LIGATION  01/09/2013   Procedure: POST PARTUM TUBAL LIGATION;  Surgeon: Ovid DELENA All, MD;  Location: [facility];  Service: Gynecology;  Laterality: Bilateral;  post partum tubal ligation bilateral   US  ECHOCARDIOGRAPHY  06/18/2009   ef 55-60%   WISDOM TOOTH EXTRACTION      REVIEW OF SYSTEMS:  A comprehensive review of systems was negative except for: Constitutional: positive for fatigue   PHYSICAL EXAMINATION: General appearance: alert, cooperative, fatigued, and no distress Head: Normocephalic, without obvious abnormality, atraumatic Neck: no adenopathy, no JVD, supple, symmetrical, trachea midline, and thyroid  not enlarged, symmetric, no tenderness/mass/nodules Lymph nodes: Cervical, supraclavicular, and axillary nodes normal. Resp: clear to auscultation bilaterally Back: symmetric, no curvature. ROM normal. No CVA tenderness. Cardio: regular rate and rhythm, S1, S2 normal, no murmur, click, rub or gallop GI: soft, non-tender; bowel sounds normal; no masses,  no organomegaly Extremities: extremities normal, atraumatic, no cyanosis or edema  ECOG PERFORMANCE STATUS: 1 - Symptomatic but completely ambulatory  Blood pressure (!) 138/93, pulse 73, temperature 97.8 F (36.6 C), temperature source Temporal, resp. rate 17, height 5' 2 (1.575 m), weight 180 lb (81.6 kg), last menstrual period 10/01/2024, SpO2 100%.  LABORATORY DATA: Lab Results  Component Value Date    WBC 3.6 (L) 10/20/2024   HGB 10.2 (L) 10/20/2024   HCT 33.7 (L) 10/20/2024   MCV 78.7 (L) 10/20/2024   PLT 299 10/20/2024      Chemistry      Component Value Date/Time   NA 140 10/04/2024 0835   K 4.4 10/04/2024 0835   CL 104 10/04/2024 0835   CO2 21 10/04/2024 0835   BUN 17 10/04/2024 0835   CREATININE 0.72 10/04/2024 0835   CREATININE 0.71 06/06/2024 0846   CREATININE 0.64 01/17/2013 1720      Component Value Date/Time   CALCIUM 9.3 10/04/2024 0835   ALKPHOS 73 10/04/2024 0835   AST 22 10/04/2024 0835   AST 15 06/06/2024 0846  ALT 25 10/04/2024 0835   ALT 16 06/06/2024 0846   BILITOT 0.5 10/04/2024 0835   BILITOT 0.5 06/06/2024 0846       RADIOGRAPHIC STUDIES: No results found.   ASSESSMENT AND PLAN: This is a very pleasant 43 years old African-American female with history of iron  deficiency anemia secondary to menorrhagia and not responding to the oral iron  tablets.  The patient was treated recently with iron  infusion with Venofer  500 Mg IV weekly for 2 weeks. She has been on oral iron  tablets and tolerating it well but not effective in controlling her anemia. She is currently on iron  infusion with Venofer  on as-needed basis.  The last infusion started in October 2025. 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 Hemoglobin improved from 8.7 g/dL in early October to 89.7 g/dL after recent iron  infusion. Persistent fatigue since last infusion. Scheduled for hysterectomy on November 21st, 2025, expected to resolve anemia by reducing blood loss. - Continue iron  infusions as needed - Proceed with scheduled hysterectomy on November 21st, 2025 - Will re-evaluate hemoglobin levels in two months post-surgery The patient was advised to call immediately if she has any concerning symptoms in the  interval. The patient voices understanding of current disease status and treatment options and is in agreement with the current care plan.  All questions were answered. The patient knows to call the clinic with any problems, questions or concerns. We can certainly see the patient much sooner if necessary.  The total time spent in the appointment was 20 minutes.  Disclaimer: This note was dictated with voice recognition software. Similar sounding words can inadvertently be transcribed and may not be corrected upon review.

## 2024-10-21 ENCOUNTER — Encounter (HOSPITAL_COMMUNITY): Payer: Self-pay | Admitting: Obstetrics and Gynecology

## 2024-10-21 NOTE — Pre-Procedure Instructions (Signed)
 Surgical Instructions  Your procedure is scheduled on :   Friday,  10-28-2024 Report to [facility] Main Entrance A at 7:15 AM, then check in the Admitting office. Any questions or running late day of surgery :  call [phone]  Questions prior to your surgery day:  call [phone], Monday -- Friday 8am - 4pm. If you experience any cold or flu symptoms such as cough, fever, chills, shortness of breath, etc. between now and you scheduled surgery, please notify your surgeon office.   Remember: Do not eat any food after midnight the night before surgery.  This includes No water,  candy,  gum, and mints.  Take these medicines the morning of surgery with A SIPS OF WATER:   NONE   May take these medicines IF NEEDED:   NONE   One week prior to surgery, STOP taking any Aspirin (unless otherwise instructed by your surgeon) Aleve, Naproxen, ibuprofen , Motrin , Advil , Goody's, BC's, all herbal medications/ supplements, fish oil, and non-prescription vitamins.  Do NOT Smoke (tobacco/ vaping) and Do Not drink alcohol  for 24 hours prior to your procedure.  For those patients that use a CPAP.  Please bring your CPAP/ mask/ tubing with them day of surgery . Anesthesia may ask recovery room nurse to use and if you stay the night you be asked to use it.  You will be asked to removed any contacts, glasses, piercing's, hearing aid's, dentures/ partials prior to surgery.  Please bring cases/ container/ solution/ etc., for them day of surgery.   Patients discharged the day of surgery will NOT be allowed to drive home.  You must have responsible driver and caregiver to stay at home with you the next 24 hours.  SURGICAL WAITING ROOM VISITATION Patients may have no more than 2 support people in the waiting area - if more than 2 , these visitors may rotate.  Pre-op nurse will coordinate an appropriate time for 1 Adult support person, who may not rotate, to accompany patient in pre-op.  Aware some patients may  have certain circumstances, speak to pre-op nurse day of surgery.  Children under the age 53 must have an adult with them who is not the patient and must remain in the main waiting area with an adult.  If the patient needs to stay at the hospital during part of their recovery, the visitor guidelines for inpatient rooms apply.  Please refer to the [facility] website for the visitor guidelines for any additional information.  If you received a COVID test during your pre-op visit it is requested that you wear a mask when out in public, stay away from anyone that may not be feeling well and notify your surgeon if you develop symptoms.  If you have been in contact with anyone that has tested positive in the past 10 days notify your surgeon.     [facility] - Preparing for Surgery  Before surgery, you can play an important role. Because skin is not sterile, it needs to be as free of germs as possible. You can reduce the number of germs on your skin by washing with CHG (chlorhexidine gluconate) soap before surgery. CHG is an antiseptic cleaner which kills germs and bonds with the skin to continue killing germs even after washing. Oral hygiene is also important in reducing the risk of infection. Remember to brush your teeth with your regular toothpaste the morning of surgery.  Please DO NOT use if you have an allergy to CHG or antibacterial soaps. If  your skin becomes reddened/irritated stop using the CHG and inform your Pre-op nurse day of surgery.  DO NOT shave (including legs and genital area) for at least 48 hours prior to your CHG shower.   Please follow these instructions carefully:  Shower with CHG soap the night before surgery. If you choose to wash your hair, wash your hair first as usual with your normal shampoo. After you shampoo, rinse your hair and body thoroughly to remove the shampoo. Use CHG as you would any other liquid soap. You can apply CHG directly to the skin and wash gently  with a clean washcloth or shower sponge. Apply the CHG soap to your body ONLY FROM THE NECK DOWN. Do not use on open wounds or open sores. Avoid contact with your eyes, ears, mouth, and genitals (private parts). Wash genitals (private parts) with your normal soap. Wash thoroughly, paying special attention to the area where your surgery will be performed. Thoroughly rinse your body with warm water from the neck down. DO NOT shower/wash with your normal soap after using and rinsing off the CHG soap. DO NOT use lotions, oils, etc., after showering with CHG. Pat yourself dry with a clean towel. Wear clean pajamas. Place clean sheets on your bed the night of your CHG shower and do not sleep with pets.  Day of Surgery  DO NOT Apply any lotions,  powder,  oils,  deodorants (may use underarm deodorant),  cologne/  perfumes  or makeup Do Not wear jewelry /  piercing's/  metal/  permanent jewelry must be removed prior to arrival day of surgery. (No plastic piercing) Do Not wear nail polish,  gel polish,  artificial nails, or any other type of covering on natural finger nails (toe nails are okay) Remember to brush your teeth and rinse mouth out. Put on clean / comfortable clothes. [facility] is not responsible for valuables/ personal belongings

## 2024-10-21 NOTE — Progress Notes (Addendum)
 Addendum:   Actual weight done at lab appointment today,  181.6 lb. Spoke w/ Dr Henry via her call phone yesterday wants CBC done day of surgery.   Spoke w/ via phone for pre-op interview--- pt Lab needs dos----    upt    (possible CBC ,  sent inbox message to Dr Henry since CBC done 10-20-2024 by hematology in epic Hg 10.2, does want a repeat CBC , if so do at lab appointment or DOS) Lab results------  lab appt 10-25-2024 @ 1300 getting T&S/ ?CBC Current EKG in epic/ chart dated 07-26-2024/  also CMP result dated 10-04-2024 current in epic COVID test -----patient states asymptomatic no test needed Arrive at -------  0715 on 10-28-2024 NPO after MN w/ exception sips of water w/ meds Pre-Surgery Ensure or G2: n/a  Med rec completed Medications to take morning of surgery ----- none Diabetic medication ----- n/a  GLP1 agonist last dose:  10-17-2024 GLP1 instructions:  pt stated was given instructions by [facility] weight loss mangement doctor when to stop and why.  Patient instructed no nail polish to be worn day of surgery Patient instructed to bring photo id and insurance card day of surgery Patient aware to have Driver (ride ) / caregiver    for 24 hours after surgery - husband, terrell mills Patient Special Instructions ----- will pick up soap and written instructions at lab appt Pre-Op special Instructions -----  pt has PCP clearance Greig Drones NP dated 10-04-2024 whom left final clearance to cardiology for hypertension in epic/ chart  Telephone cardiac clearance by Orren Fabry PA dated 10-09-2024 in epic/ chart  Patient verbalized understanding of instructions that were given at this phone interview. Patient denies chest pain, sob, fever, cough at the interview.   Anesthesia review:  no;   pt has cardiac clearance;  HTN;  pre-diabetes;  migraines;  IDA secondary to CBL  PCP:   Greig Krabbe NP graciela 10-04-2024) Cardiologist:  Dr SHAUNNA. [facility] 09-28-2024) Hematologist:  Dr CHRISTELLA. Mohamed  graciela 10-20-2024)  EKG:  07-26-2024 Echo:  08-24-2024 Stress:  no Cardiac cath:  no  Activity level:  pt denies sob w/ any activity Sleep study/ cpap: NO

## 2024-10-25 ENCOUNTER — Encounter (HOSPITAL_COMMUNITY)
Admission: RE | Admit: 2024-10-25 | Discharge: 2024-10-25 | Disposition: A | Discharge status: Home / Self Care | Source: Ambulatory Visit | Attending: Obstetrics and Gynecology | Admitting: Obstetrics and Gynecology

## 2024-10-25 ENCOUNTER — Ambulatory Visit (INDEPENDENT_AMBULATORY_CARE_PROVIDER_SITE_OTHER)

## 2024-10-25 VITALS — BP 144/89 | HR 77 | Temp 98.2°F | Resp 18 | Ht 62.0 in | Wt 183.6 lb

## 2024-10-25 DIAGNOSIS — D500 Iron deficiency anemia secondary to blood loss (chronic): Secondary | ICD-10-CM

## 2024-10-25 DIAGNOSIS — Z01810 Encounter for preprocedural cardiovascular examination: Secondary | ICD-10-CM | POA: Insufficient documentation

## 2024-10-25 DIAGNOSIS — Z01818 Encounter for other preprocedural examination: Secondary | ICD-10-CM | POA: Diagnosis present

## 2024-10-25 LAB — TYPE AND SCREEN
ABO/RH(D): A POS
Antibody Screen: NEGATIVE

## 2024-10-25 MED ORDER — IRON SUCROSE 300 MG IVPB - SIMPLE MED
300.0000 mg | Freq: Once | Status: AC
  Administered 2024-10-25: 300 mg via INTRAVENOUS

## 2024-10-25 NOTE — Progress Notes (Signed)
 Diagnosis: Iron  Deficiency Anemia  Provider:  Praveen Mannam MD  Procedure: IV Infusion  IV Type: Peripheral, IV Location: R Antecubital  Venofer  (Iron  Sucrose), Dose: 300 mg  Infusion Start Time: 0911  Infusion Stop Time: 1049  Post Infusion IV Care: Patient declined observation  Discharge: Condition: Good, Destination: Home . AVS Declined  Performed by:  Eleanor DELENA Bloch, RN

## 2024-10-28 ENCOUNTER — Other Ambulatory Visit: Payer: Self-pay

## 2024-10-28 ENCOUNTER — Encounter (HOSPITAL_COMMUNITY): Payer: Self-pay | Admitting: Anesthesiology

## 2024-10-28 ENCOUNTER — Encounter (HOSPITAL_COMMUNITY)
Admission: RE | Disposition: A | Discharge status: Home / Self Care | Payer: Self-pay | Source: Home / Self Care | Attending: Obstetrics and Gynecology

## 2024-10-28 ENCOUNTER — Encounter (HOSPITAL_COMMUNITY): Payer: Self-pay | Admitting: Obstetrics and Gynecology

## 2024-10-28 ENCOUNTER — Ambulatory Visit (HOSPITAL_COMMUNITY)
Admission: RE | Admit: 2024-10-28 | Discharge: 2024-10-29 | Disposition: A | Discharge status: Home / Self Care | Attending: Obstetrics and Gynecology | Admitting: Obstetrics and Gynecology

## 2024-10-28 ENCOUNTER — Ambulatory Visit (HOSPITAL_COMMUNITY): Payer: Self-pay | Admitting: Anesthesiology

## 2024-10-28 DIAGNOSIS — N83201 Unspecified ovarian cyst, right side: Secondary | ICD-10-CM

## 2024-10-28 DIAGNOSIS — N8003 Adenomyosis of the uterus: Secondary | ICD-10-CM | POA: Diagnosis not present

## 2024-10-28 DIAGNOSIS — Z01818 Encounter for other preprocedural examination: Secondary | ICD-10-CM

## 2024-10-28 DIAGNOSIS — N921 Excessive and frequent menstruation with irregular cycle: Secondary | ICD-10-CM | POA: Diagnosis present

## 2024-10-28 DIAGNOSIS — R7303 Prediabetes: Secondary | ICD-10-CM | POA: Diagnosis not present

## 2024-10-28 DIAGNOSIS — D259 Leiomyoma of uterus, unspecified: Secondary | ICD-10-CM

## 2024-10-28 DIAGNOSIS — Z7985 Long-term (current) use of injectable non-insulin antidiabetic drugs: Secondary | ICD-10-CM | POA: Insufficient documentation

## 2024-10-28 DIAGNOSIS — I10 Essential (primary) hypertension: Secondary | ICD-10-CM | POA: Diagnosis not present

## 2024-10-28 DIAGNOSIS — Z79899 Other long term (current) drug therapy: Secondary | ICD-10-CM | POA: Insufficient documentation

## 2024-10-28 DIAGNOSIS — D500 Iron deficiency anemia secondary to blood loss (chronic): Secondary | ICD-10-CM | POA: Diagnosis not present

## 2024-10-28 DIAGNOSIS — Z794 Long term (current) use of insulin: Secondary | ICD-10-CM | POA: Insufficient documentation

## 2024-10-28 DIAGNOSIS — R011 Cardiac murmur, unspecified: Secondary | ICD-10-CM | POA: Insufficient documentation

## 2024-10-28 DIAGNOSIS — Z90710 Acquired absence of both cervix and uterus: Secondary | ICD-10-CM | POA: Diagnosis present

## 2024-10-28 HISTORY — PX: CYSTOSCOPY: SHX5120

## 2024-10-28 HISTORY — PX: LAPAROSCOPIC BILATERAL SALPINGECTOMY: SHX5889

## 2024-10-28 HISTORY — DX: Iron deficiency anemia secondary to blood loss (chronic): D50.0

## 2024-10-28 HISTORY — DX: Migraine, unspecified, not intractable, without status migrainosus: G43.909

## 2024-10-28 HISTORY — DX: Personal history of other infectious and parasitic diseases: Z86.19

## 2024-10-28 HISTORY — DX: Excessive and frequent menstruation with irregular cycle: N92.1

## 2024-10-28 HISTORY — PX: LAPAROSCOPIC ASSISTED VAGINAL HYSTERECTOMY: SHX5398

## 2024-10-28 HISTORY — DX: Diverticulosis of large intestine without perforation or abscess without bleeding: K57.30

## 2024-10-28 HISTORY — DX: Cardiac murmur, unspecified: R01.1

## 2024-10-28 HISTORY — DX: Anemia, unspecified: D64.9

## 2024-10-28 LAB — CBC
HCT: 36.1 % (ref 36.0–46.0)
Hemoglobin: 10.5 g/dL — ABNORMAL LOW (ref 12.0–15.0)
MCH: 24.8 pg — ABNORMAL LOW (ref 26.0–34.0)
MCHC: 29.1 g/dL — ABNORMAL LOW (ref 30.0–36.0)
MCV: 85.3 fL (ref 80.0–100.0)
Platelets: 292 K/uL (ref 150–400)
RBC: 4.23 MIL/uL (ref 3.87–5.11)
RDW: 19.9 % — ABNORMAL HIGH (ref 11.5–15.5)
WBC: 4.2 K/uL (ref 4.0–10.5)
nRBC: 0 % (ref 0.0–0.2)

## 2024-10-28 LAB — POCT PREGNANCY, URINE: Preg Test, Ur: NEGATIVE

## 2024-10-28 SURGERY — HYSTERECTOMY, VAGINAL, LAPAROSCOPY-ASSISTED
Anesthesia: General | Site: Pelvis

## 2024-10-28 MED ORDER — SODIUM CHLORIDE (PF) 0.9 % IJ SOLN
INTRAMUSCULAR | Status: AC
  Filled 2024-10-28: qty 100

## 2024-10-28 MED ORDER — POVIDONE-IODINE 10 % EX SWAB
2.0000 | Freq: Once | CUTANEOUS | Status: AC
  Administered 2024-10-28: 2 via TOPICAL

## 2024-10-28 MED ORDER — ALBUMIN HUMAN 5 % IV SOLN: INTRAVENOUS | Status: DC | PRN

## 2024-10-28 MED ORDER — CEFAZOLIN SODIUM-DEXTROSE 2-3 GM-%(50ML) IV SOLR
INTRAVENOUS | Status: DC | PRN
  Administered 2024-10-28: 2 g via INTRAVENOUS

## 2024-10-28 MED ORDER — SCOPOLAMINE 1 MG/3DAYS TD PT72
1.0000 | MEDICATED_PATCH | TRANSDERMAL | Status: DC
  Administered 2024-10-28: 1 mg via TRANSDERMAL

## 2024-10-28 MED ORDER — BUPIVACAINE HCL (PF) 0.25 % IJ SOLN
INTRAMUSCULAR | Status: AC
  Filled 2024-10-28: qty 30

## 2024-10-28 MED ORDER — LIDOCAINE 2% (20 MG/ML) 5 ML SYRINGE
INTRAMUSCULAR | Status: AC
Start: 2024-10-28 — End: 2024-10-28
  Filled 2024-10-28: qty 5

## 2024-10-28 MED ORDER — HYDROMORPHONE HCL 1 MG/ML IJ SOLN
INTRAMUSCULAR | Status: AC
  Filled 2024-10-28: qty 0.5

## 2024-10-28 MED ORDER — SODIUM CHLORIDE 0.9 % IR SOLN
Status: DC | PRN
  Administered 2024-10-28: 2000 mL

## 2024-10-28 MED ORDER — DIPHENHYDRAMINE HCL 12.5 MG/5ML PO ELIX: 12.5000 mg | ORAL_SOLUTION | Freq: Four times a day (QID) | ORAL | Status: DC | PRN

## 2024-10-28 MED ORDER — PHENYLEPHRINE 80 MCG/ML (10ML) SYRINGE FOR IV PUSH (FOR BLOOD PRESSURE SUPPORT)
PREFILLED_SYRINGE | INTRAVENOUS | Status: DC | PRN
  Administered 2024-10-28: 80 ug via INTRAVENOUS

## 2024-10-28 MED ORDER — CEFAZOLIN SODIUM-DEXTROSE 2-4 GM/100ML-% IV SOLN
INTRAVENOUS | Status: AC
  Filled 2024-10-28: qty 100

## 2024-10-28 MED ORDER — ONDANSETRON HCL 4 MG/2ML IJ SOLN: 4.0000 mg | Freq: Four times a day (QID) | INTRAMUSCULAR | Status: DC | PRN

## 2024-10-28 MED ORDER — LACTATED RINGERS IV SOLN: INTRAVENOUS | Status: DC

## 2024-10-28 MED ORDER — HYDROMORPHONE HCL 1 MG/ML IJ SOLN
INTRAMUSCULAR | Status: DC | PRN
  Administered 2024-10-28 (×2): .25 mg via INTRAVENOUS

## 2024-10-28 MED ORDER — SCOPOLAMINE 1 MG/3DAYS TD PT72
MEDICATED_PATCH | TRANSDERMAL | Status: AC
Start: 2024-10-28 — End: 2024-10-28
  Filled 2024-10-28: qty 1

## 2024-10-28 MED ORDER — 0.9 % SODIUM CHLORIDE (POUR BTL) OPTIME
TOPICAL | Status: DC | PRN
  Administered 2024-10-28: 500 mL
  Administered 2024-10-28: 1000 mL

## 2024-10-28 MED ORDER — HYDROMORPHONE HCL 1 MG/ML IJ SOLN
INTRAMUSCULAR | Status: AC
  Filled 2024-10-28: qty 1

## 2024-10-28 MED ORDER — PHENYLEPHRINE 80 MCG/ML (10ML) SYRINGE FOR IV PUSH (FOR BLOOD PRESSURE SUPPORT)
PREFILLED_SYRINGE | INTRAVENOUS | Status: AC
  Filled 2024-10-28: qty 10

## 2024-10-28 MED ORDER — CHLORHEXIDINE GLUCONATE 0.12 % MT SOLN
OROMUCOSAL | Status: AC
  Filled 2024-10-28: qty 15

## 2024-10-28 MED ORDER — LIDOCAINE 2% (20 MG/ML) 5 ML SYRINGE
INTRAMUSCULAR | Status: DC | PRN
  Administered 2024-10-28: 80 mg via INTRAVENOUS

## 2024-10-28 MED ORDER — ROCURONIUM BROMIDE 10 MG/ML (PF) SYRINGE
PREFILLED_SYRINGE | INTRAVENOUS | Status: DC | PRN
  Administered 2024-10-28: 30 mg via INTRAVENOUS
  Administered 2024-10-28: 20 mg via INTRAVENOUS
  Administered 2024-10-28: 10 mg via INTRAVENOUS
  Administered 2024-10-28: 80 mg via INTRAVENOUS
  Administered 2024-10-28: 10 mg via INTRAVENOUS

## 2024-10-28 MED ORDER — CHLORHEXIDINE GLUCONATE 0.12 % MT SOLN
15.0000 mL | Freq: Once | OROMUCOSAL | Status: AC
  Administered 2024-10-28: 15 mL via OROMUCOSAL

## 2024-10-28 MED ORDER — PROPOFOL 10 MG/ML IV BOLUS
INTRAVENOUS | Status: DC | PRN
  Administered 2024-10-28: 20 mg via INTRAVENOUS
  Administered 2024-10-28: 150 mg via INTRAVENOUS
  Administered 2024-10-28: 50 mg via INTRAVENOUS
  Administered 2024-10-28: 40 mg via INTRAVENOUS

## 2024-10-28 MED ORDER — SODIUM CHLORIDE 0.9% FLUSH: 9.0000 mL | INTRAVENOUS | Status: DC | PRN

## 2024-10-28 MED ORDER — OXYCODONE HCL 5 MG PO TABS
5.0000 mg | ORAL_TABLET | ORAL | Status: DC | PRN
  Administered 2024-10-29: 10 mg via ORAL
  Filled 2024-10-28: qty 2

## 2024-10-28 MED ORDER — VASOPRESSIN 20 UNIT/ML IV SOLN
INTRAVENOUS | Status: DC | PRN
  Administered 2024-10-28: 13 mL via INTRAMUSCULAR

## 2024-10-28 MED ORDER — FENTANYL CITRATE (PF) 100 MCG/2ML IJ SOLN
INTRAMUSCULAR | Status: AC
  Filled 2024-10-28: qty 2

## 2024-10-28 MED ORDER — ONDANSETRON HCL 4 MG/2ML IJ SOLN
INTRAMUSCULAR | Status: DC | PRN
Start: 2024-10-28 — End: 2024-10-28
  Administered 2024-10-28: 4 mg via INTRAVENOUS

## 2024-10-28 MED ORDER — BUPIVACAINE HCL (PF) 0.25 % IJ SOLN
INTRAMUSCULAR | Status: DC | PRN
Start: 2024-10-28 — End: 2024-10-28
  Administered 2024-10-28: 13 mL

## 2024-10-28 MED ORDER — MIDAZOLAM HCL 2 MG/2ML IJ SOLN
INTRAMUSCULAR | Status: AC
Start: 2024-10-28 — End: 2024-10-28
  Filled 2024-10-28: qty 2

## 2024-10-28 MED ORDER — PROPOFOL 10 MG/ML IV BOLUS
INTRAVENOUS | Status: AC
  Filled 2024-10-28: qty 20

## 2024-10-28 MED ORDER — MIDAZOLAM HCL (PF) 2 MG/2ML IJ SOLN
INTRAMUSCULAR | Status: DC | PRN
  Administered 2024-10-28: 2 mg via INTRAVENOUS

## 2024-10-28 MED ORDER — IBUPROFEN 600 MG PO TABS: 600.0000 mg | ORAL_TABLET | Freq: Four times a day (QID) | ORAL | Status: DC

## 2024-10-28 MED ORDER — DEXAMETHASONE SOD PHOSPHATE PF 10 MG/ML IJ SOLN
INTRAMUSCULAR | Status: DC | PRN
  Administered 2024-10-28: 10 mg via INTRAVENOUS

## 2024-10-28 MED ORDER — ONDANSETRON HCL 4 MG/2ML IJ SOLN: 4.0000 mg | Freq: Once | INTRAMUSCULAR | Status: DC | PRN

## 2024-10-28 MED ORDER — ACETAMINOPHEN 500 MG PO TABS: 1000.0000 mg | ORAL_TABLET | Freq: Four times a day (QID) | ORAL | Status: DC | PRN

## 2024-10-28 MED ORDER — HYDROMORPHONE 1 MG/ML IV SOLN
INTRAVENOUS | Status: DC
  Administered 2024-10-28 – 2024-10-29 (×4): 30 mg via INTRAVENOUS
  Filled 2024-10-28: qty 30

## 2024-10-28 MED ORDER — DIPHENHYDRAMINE HCL 50 MG/ML IJ SOLN: 12.5000 mg | Freq: Four times a day (QID) | INTRAMUSCULAR | Status: DC | PRN

## 2024-10-28 MED ORDER — HYDROMORPHONE HCL 1 MG/ML IJ SOLN
0.2500 mg | INTRAMUSCULAR | Status: DC | PRN
  Administered 2024-10-28 (×3): 0.25 mg via INTRAVENOUS
  Administered 2024-10-28: 0.5 mg via INTRAVENOUS
  Administered 2024-10-28: 0.25 mg via INTRAVENOUS

## 2024-10-28 MED ORDER — KETOROLAC TROMETHAMINE 30 MG/ML IJ SOLN
30.0000 mg | Freq: Four times a day (QID) | INTRAMUSCULAR | Status: AC
  Administered 2024-10-28 – 2024-10-29 (×4): 30 mg via INTRAVENOUS
  Filled 2024-10-28 (×4): qty 1

## 2024-10-28 MED ORDER — ROCURONIUM BROMIDE 10 MG/ML (PF) SYRINGE
PREFILLED_SYRINGE | INTRAVENOUS | Status: AC
  Filled 2024-10-28: qty 10

## 2024-10-28 MED ORDER — NALOXONE HCL 0.4 MG/ML IJ SOLN: 0.4000 mg | INTRAMUSCULAR | Status: DC | PRN

## 2024-10-28 MED ORDER — OXYCODONE HCL 5 MG/5ML PO SOLN: 5.0000 mg | Freq: Once | ORAL | Status: DC | PRN

## 2024-10-28 MED ORDER — OXYCODONE HCL 5 MG PO TABS: 5.0000 mg | ORAL_TABLET | Freq: Once | ORAL | Status: DC | PRN

## 2024-10-28 MED ORDER — ONDANSETRON HCL 4 MG/2ML IJ SOLN
INTRAMUSCULAR | Status: AC
  Filled 2024-10-28: qty 2

## 2024-10-28 MED ORDER — HEMOSTATIC AGENTS (NO CHARGE) OPTIME
TOPICAL | Status: DC | PRN
  Administered 2024-10-28: 1 via TOPICAL

## 2024-10-28 MED ORDER — DROPERIDOL 2.5 MG/ML IJ SOLN: 0.6250 mg | Freq: Once | INTRAMUSCULAR | Status: DC | PRN

## 2024-10-28 MED ORDER — ACETAMINOPHEN 10 MG/ML IV SOLN
INTRAVENOUS | Status: DC | PRN
  Administered 2024-10-28: 1000 mg via INTRAVENOUS

## 2024-10-28 MED ORDER — SUGAMMADEX SODIUM 200 MG/2ML IV SOLN
INTRAVENOUS | Status: DC | PRN
  Administered 2024-10-28: 200 mg via INTRAVENOUS

## 2024-10-28 MED ORDER — FENTANYL CITRATE (PF) 250 MCG/5ML IJ SOLN
INTRAMUSCULAR | Status: DC | PRN
  Administered 2024-10-28: 100 ug via INTRAVENOUS

## 2024-10-28 MED ORDER — ORAL CARE MOUTH RINSE: 15.0000 mL | Freq: Once | OROMUCOSAL | Status: AC

## 2024-10-28 MED ORDER — DEXMEDETOMIDINE HCL IN NACL 80 MCG/20ML IV SOLN
INTRAVENOUS | Status: DC | PRN
  Administered 2024-10-28: 4 ug via INTRAVENOUS
  Administered 2024-10-28: 8 ug via INTRAVENOUS
  Administered 2024-10-28 (×3): 4 ug via INTRAVENOUS
  Administered 2024-10-28: 8 ug via INTRAVENOUS

## 2024-10-28 SURGICAL SUPPLY — 53 items
COVER BACK TABLE 60X90IN (DRAPES) ×4 IMPLANT
COVER MAYO STAND STRL (DRAPES) ×4 IMPLANT
DERMABOND ADVANCED .7 DNX12 (GAUZE/BANDAGES/DRESSINGS) ×4 IMPLANT
DRAPE SURG IRRIG POUCH 19X23 (DRAPES) ×4 IMPLANT
DRSG COVADERM PLUS 2X2 (GAUZE/BANDAGES/DRESSINGS) IMPLANT
DURAPREP 26ML APPLICATOR (WOUND CARE) ×4 IMPLANT
ELECTRODE REM PT RTRN 9FT ADLT (ELECTROSURGICAL) ×4 IMPLANT
GAUZE 4X4 16PLY ~~LOC~~+RFID DBL (SPONGE) ×8 IMPLANT
GLOVE BIOGEL PI IND STRL 6.5 (GLOVE) IMPLANT
GLOVE BIOGEL PI IND STRL 7.0 (GLOVE) IMPLANT
GLOVE BIOGEL PI IND STRL 7.5 (GLOVE) ×8 IMPLANT
GLOVE SURG SS PI 7.0 STRL IVOR (GLOVE) IMPLANT
GLOVE SURG SS PI 7.5 STRL IVOR (GLOVE) IMPLANT
GLOVE SURG UNDER POLY LF SZ7 (GLOVE) ×8 IMPLANT
GOWN STRL REUS W/ TWL LRG LVL3 (GOWN DISPOSABLE) ×12 IMPLANT
GOWN STRL SURGICAL XL XLNG (GOWN DISPOSABLE) IMPLANT
HEMOSTAT SURGICEL 4X8 (HEMOSTASIS) IMPLANT
HIBICLENS CHG 4% 4OZ BTL (MISCELLANEOUS) ×8 IMPLANT
HOLDER FOLEY CATH W/STRAP (MISCELLANEOUS) IMPLANT
IRRIGATION SUCT STRKRFLW 2 WTP (MISCELLANEOUS) ×4 IMPLANT
IV 0.9% NACL 1000 ML (IV SOLUTION) IMPLANT
KIT PINK PAD W/HEAD ARM REST (MISCELLANEOUS) ×4 IMPLANT
KIT TURNOVER KIT B (KITS) ×4 IMPLANT
LIGASURE VESSEL 5MM BLUNT TIP (ELECTROSURGICAL) IMPLANT
MANIFOLD NEPTUNE II (INSTRUMENTS) ×4 IMPLANT
NDL HYPO 22X1.5 SAFETY MO (MISCELLANEOUS) IMPLANT
NDL INSUFFLATION 14GA 120MM (NEEDLE) ×4 IMPLANT
NEEDLE HYPO 22X1.5 SAFETY MO (MISCELLANEOUS) ×4 IMPLANT
NEEDLE INSUFFLATION 14GA 120MM (NEEDLE) ×4 IMPLANT
PACK LAVH (CUSTOM PROCEDURE TRAY) ×4 IMPLANT
PAD ARMBOARD POSITIONER FOAM (MISCELLANEOUS) ×4 IMPLANT
PAD OB MATERNITY 11 LF (PERSONAL CARE ITEMS) ×4 IMPLANT
PAD POSITIONING PINK XL (MISCELLANEOUS) ×4 IMPLANT
POWDER SURGICEL 3.0 GRAM (HEMOSTASIS) IMPLANT
SET TUBE SMOKE EVAC HIGH FLOW (TUBING) ×4 IMPLANT
SLEEVE ADV FIXATION 5X100MM (TROCAR) ×4 IMPLANT
SOLN 0.9% NACL POUR BTL 1000ML (IV SOLUTION) ×4 IMPLANT
SPIKE FLUID TRANSFER (MISCELLANEOUS) ×4 IMPLANT
STRIP CLOSURE SKIN 1/2X4 (GAUZE/BANDAGES/DRESSINGS) ×4 IMPLANT
SUT MNCRL AB 3-0 PS2 27 (SUTURE) ×4 IMPLANT
SUT MON AB 4-0 PS1 27 (SUTURE) ×4 IMPLANT
SUT VIC AB 0 CT1 18XCR BRD8 (SUTURE) ×8 IMPLANT
SUT VIC AB 0 CT1 27XBRD ANBCTR (SUTURE) ×8 IMPLANT
SUT VIC AB 0 CT1 36 (SUTURE) IMPLANT
SUT VICRYL 0 UR6 27IN ABS (SUTURE) ×4 IMPLANT
SYR BULB IRRIG 60ML STRL (SYRINGE) IMPLANT
SYR CONTROL 10ML LL (SYRINGE) ×4 IMPLANT
TIP ENDOSCOPIC SURGICEL (TIP) IMPLANT
TOWEL GREEN STERILE FF (TOWEL DISPOSABLE) ×8 IMPLANT
TRAY FOLEY W/BAG SLVR 14FR (SET/KITS/TRAYS/PACK) ×4 IMPLANT
TROCAR ADV FIXATION 5X100MM (TROCAR) ×4 IMPLANT
UNDERPAD 30X36 HEAVY ABSORB (UNDERPADS AND DIAPERS) ×4 IMPLANT
WARMER LAPAROSCOPE (MISCELLANEOUS) ×4 IMPLANT

## 2024-10-28 NOTE — Anesthesia Postprocedure Evaluation (Signed)
 Anesthesia Post Note  Patient: Soren N Fill  Procedure(s) Performed: HYSTERECTOMY, VAGINAL, LAPAROSCOPY-ASSISTED (Pelvis) CYSTOSCOPY (Bladder) SALPINGECTOMY, BILATERAL, LAPAROSCOPIC (Pelvis)     Patient location during evaluation: PACU Anesthesia Type: General Level of consciousness: awake and alert and oriented Pain management: pain level controlled Vital Signs Assessment: post-procedure vital signs reviewed and stable Respiratory status: spontaneous breathing, nonlabored ventilation and respiratory function stable Cardiovascular status: blood pressure returned to baseline and stable Postop Assessment: no apparent nausea or vomiting Anesthetic complications: no   There were no known notable events for this encounter.  Last Vitals:  Vitals:   10/28/24 1302 10/28/24 1315  BP: 138/89 136/79  Pulse:  67  Resp:  20  Temp: (!) 36.4 C   SpO2:  99%    Last Pain:  Vitals:   10/28/24 1315  TempSrc:   PainSc: 5                  Verlinda Slotnick A.

## 2024-10-28 NOTE — Op Note (Signed)
 Preop Diagnosis: 1.Symptomatic Fibroids 2.Menometrorrhagia   Postop Diagnosis: 1.Symptomatic Fibroids 2.Menometrorrhagia   Procedure: 1.LAPAROSCOPIC ASSISTED VAGINAL HYSTERECTOMY 2.BILATERAL SALPINGECTOMY 3.RT CYSTECTOMY 4.CYSTOSCOPY   Anesthesia: General    Attending: Dr. Jon Rummer    Assistant:  Dr. Ovid All   Findings: Nl appearing uterus, cervix, bilateral fallopian tubes and rt ovarian cyst.  No endometriotic lesions noted.   Pathology: 1.uterus and cervix 2.bilateral fallopian tubes    Fluids: 1100 cc crystalloid 250cc    UOP: 300 cc   EBL: 350 cc   Complications: None   Procedure:  The patient was taken to the operating room after the risks, benefits, alternatives, complications, treatment options, and expected outcomes were discussed with the patient. The patient verbalized understanding, the patient concurred with the proposed plan and consent signed and witnessed. The patient was taken to the Operating Room and identified as Patricia Lin and the procedure verified as LAVH/BS/Cystoscopy.  The patient was placed under general anesthesia per anesthesia staff, the patient was placed in modified dorsal lithotomy position and was prepped, draped, and catheterized in the normal, sterile fashion.  A Time Out was held and the above information confirmed.   The cervix was visualized and an intrauterine manipulator was placed.  A 10 mm umbilical incision was then performed. Veress needle was passed and pneumoperitoneum was established. A 10 mm trocar was advanced into the intraabdominal cavity, the laparoscope was introduced and findings as noted above.  Patient was placed in trendelenburg and marcaine  injected in the LLQ and a 5 mm incision was made and 5 mm trocar advanced into the intraabdominal cavity.  The same was done in the RLQ.  The ligasure was used to excise the remaining portion of the right fallopian tube (h/o BTL).  The fallopian tube was removed and sent to  pathology.  The same was done on the contralateral side.  The ligasure was then used to cauterize and cut the utero-ovarian ligament down to the round ligament and bladder flap created on that side.  The same was done on the contralateral side.  Attention was then turned to the perineum after covering the abdominal trocars.  The intrauterine manipulator was removed and weighted speculum placed.  The anterior lip of the cervix was grasped with a single tooth tenaculum and the cervix injected with dilute pitressin (20u in 100 cc NS).  The cervix was then circumscribed with the bovie and anterior cul-de-sac entered without difficulty.  The posterior cul-de-sac was then entered as well and uterosacral ligament on the right clamped with the heaney clamp, cut and suture ligated.  The same was done on the contralateral side.  Weighted speculum was then removed and Kistner weighted speculum placed in the vagina and in the posterior cul-de-sac.  In a sequential fashion, the paracervical tissue and parametrial tissue was clamped cut and suture ligated on the right and then on the contralateral side until the uterus was free.  A McCall culdoplasty stitch was placed and the cuff closed with figure of eight stitches of 0 vicryl in a horizontal fashion and then McCall stitch ligated.  Cystoscopy was performed and bilateral ureters were noted to efflux without difficulty and no inadvertent bladder injuries were noted.   Attention was then returned to the abdomen after regowning and regloving.  The laparoscope was introduced at the umbilicus and inspection performed.  The abdomen was copiously irrigated.  There was small amount of oozing noted at the right side of cuff which was cauteries with the tripolar.  Good hemostasis at all pedicles and at the cuff were noted.  The umbilical fascial incision was then repaired with 0 vicryl.  The right and left lower quadrant trocars were removed under direct visualization and pneumoperitoneum  relieved.  The 10mm umbilical trocar was then removed under direct visualization and fascia repaired with 0 vicryl.  The 10mm incision was reapproximated with 3-0 monocryl and then dermabond applied to all three abdominal incisions.     Sponge, instrument, lap and needle counts were correct.  The patient tolerated the procedure well and was awaiting extubation and transfer to the recovery room in stable condition.  I was present and scrubbed and the assistant was required due to complexity of anatomy.

## 2024-10-28 NOTE — Transfer of Care (Cosign Needed)
 Immediate Anesthesia Transfer of Care Note  Patient: Patricia Lin  Procedure(s) Performed: HYSTERECTOMY, VAGINAL, LAPAROSCOPY-ASSISTED (Pelvis) CYSTOSCOPY (Bladder) SALPINGECTOMY, BILATERAL, LAPAROSCOPIC (Pelvis)  Patient Location: PACU  Anesthesia Type:General  Level of Consciousness: drowsy  Airway & Oxygen Therapy: Patient Spontanous Breathing and Patient connected to face mask oxygen  Post-op Assessment: Report given to RN and Post -op Vital signs reviewed and stable  Post vital signs: Reviewed and stable  Last Vitals:  Vitals Value Taken Time  BP 138/89 10/28/24 13:02  Temp    Pulse 66 10/28/24 13:04  Resp 19 10/28/24 13:04  SpO2 100 % 10/28/24 13:04  Vitals shown include unfiled device data.  Last Pain:  Vitals:   10/28/24 0801  TempSrc: Oral  PainSc: 0-No pain      Patients Stated Pain Goal: 3 (10/28/24 0801)  Complications: There were no known notable events for this encounter.

## 2024-10-28 NOTE — H&P (Signed)
 Patricia Lin is an 43 y.o. female. Pt presenting for scheduled LAVH/BS/possible TAH/possible cystoscopy d/t longstanding menorrhagia desiring definitive mgmt.  Pertinent Gynecological History: Menses: heavy and prolonged cycles, possible adenomyosis Has been undergoing iron  infusions h/o BTL Previous GYN Procedures: cryo and LEEP  Last mammogram: normal Date: 08/2024 Last pap: normal Date: 04/2024 OB History: G6, P4024   Menstrual History:  Patient's last menstrual period was 10/20/2024 (approximate).    Past Medical History:  Diagnosis Date   Adenomyosis    Anxiety    Depression    Diverticulosis of colon    Fatigue associated with anemia    H/O varicella    Heart murmur    last cardiologist office visit by Patricia Fabry PA dated 09-28-2024 murmur heard,  echo was done 08-24-2024 trivial MR/  mild TR   History of cervical dysplasia 2009   abn pap HGSIL ;   08/ 2009  s/p LEEP -- CIN 1;   11/ 2009  s/p top hat and LEEP for persistant CIN 1 (previous LEEP positive margins)   History of chlamydia infection    History of diverticulitis of colon 08/2022   no surgical intervention   History of palpitations in adulthood 2023   cardiology--- Patricia Lin;  evaluation for palpitations and murmur in 2023,  per note no murmur heard them and had trivial MR/ TR echo, no monitor done   History of pre-eclampsia 2011   with pregnancies 2011 and 2013   HTN (hypertension)    followed by pcp /   cardiologist--- Patricia Lin;   pt has had previous surgery cancelled due to bp elevated;   Hyperlipidemia    Iron  deficiency anemia due to chronic blood loss    oncology/ hematology--- Patricia Patricia Lin;  treated w/ IV iron  infusions,  last  x3 11/ 2025 (4th, 11th, 18th);  x2 10/ 2025;  06/2024 x2;  needed infusions in previous years and transusions   Menorrhagia with irregular cycle    Migraines     Past Surgical History:  Procedure Laterality Date   BREAST REDUCTION SURGERY Bilateral 2008   CERVICAL  BIOPSY  W/ LOOP ELECTRODE EXCISION  10/16/2008   @[facility]   by Patricia Patricia. Lin;  top hat(endocervial )   COLONOSCOPY WITH PROPOFOL   08/2024   Patricia Lin   DILATION AND EVACUATION  01/29/2011   @[facility]   by Patricia Patricia Lin;   FINGER SURGERY Right 2001   Right index finger tendon repair   LEEP  07/25/2008   @[facility]   by Patricia Patricia. Lin   TUBAL LIGATION  01/09/2013   Procedure: POST PARTUM TUBAL LIGATION;  Surgeon: Patricia Patricia All, MD;  Location: [facility];  Service: Gynecology;  Laterality: Bilateral;  post partum tubal ligation bilateral   WISDOM TOOTH EXTRACTION      Family History  Problem Relation Age of Onset   Hypertension Mother    Hyperlipidemia Mother    Coronary artery disease Mother    Heart disease Mother        high cholesterol   Asthma Mother    Diabetes Mother    Depression Mother    Anxiety disorder Mother    Sleep apnea Mother    Obesity Mother    Eating disorder Mother    Hypertension Father    High Cholesterol Father    Heart disease Father    Hypertension Maternal Grandmother    Hypertension Maternal Grandfather    Asthma Daughter    Alcohol  abuse Maternal Uncle  Drug abuse Maternal Uncle     Social History:  reports that she has never smoked. She has never used smokeless tobacco. She reports current alcohol  use of about 3.0 standard drinks of alcohol  per week. She reports that she does not use drugs.  Allergies:  Allergies  Allergen Reactions   Latex Itching and Rash    Medications Prior to Admission  Medication Sig Dispense Refill Last Dose/Taking   aspirin-acetaminophen -caffeine  (EXCEDRIN MIGRAINE) 250-250-65 MG tablet Take by mouth every 6 (six) hours as needed for headache.   Past Month   Iron , Ferrous Sulfate , 325 (65 Fe) MG TABS Take 325 mg by mouth daily. (Patient taking differently: Take 325 mg by mouth 3 (three) times daily with meals.) 90 tablet 0 10/27/2024   valsartan  (DIOVAN ) 80 MG tablet Take 1 tablet (80 mg total) by mouth daily. (Patient taking differently:  Take 80 mg by mouth daily with lunch.) 90 tablet 2 10/27/2024 Noon   Vitamin D , Ergocalciferol , (DRISDOL ) 1.25 MG (50000 UNIT) CAPS capsule Take 1 capsule (50,000 Units total) by mouth every 7 (seven) days. (Patient taking differently: Take 50,000 Units by mouth every 7 (seven) days. Wednesday's) 4 capsule 0 Past Week   Insulin  Pen Needle (BD PEN NEEDLE NANO 2ND GEN) 32G X 4 MM MISC Use 1 needle daily to inject medication. 100 each 0    nystatin -triamcinolone  ointment (MYCOLOG) Apply 1 Application topically 2 (two) times daily. (Patient not taking: Reported on 10/21/2024) 60 g 2 Not Taking   semaglutide -weight management (WEGOVY ) 1.7 MG/0.75ML SOAJ SQ injection Inject 1.7 mg into the skin once a week for 28 days. (Patient taking differently: Inject 1.7 mg into the skin once a week. Monday evening's) 3 mL 0 10/17/2024    Review of Systems No F/C/N/V/D  Blood pressure (!) 151/106, pulse 88, temperature 98.7 F (37.1 C), temperature source Oral, resp. rate 18, height 5' 2 (1.575 m), weight 81.2 kg, last menstrual period 10/20/2024, SpO2 100%. Physical Exam Lungs unlabored breathing CV RRR Abdomen soft, NT Extremities no calf tenderness  Results for orders placed or performed during the hospital encounter of 10/28/24 (from the past 24 hours)  Pregnancy, urine POC     Status: None   Collection Time: 10/28/24  8:08 AM  Result Value Ref Range   Preg Test, Ur NEGATIVE NEGATIVE  CBC     Status: Abnormal   Collection Time: 10/28/24  8:25 AM  Result Value Ref Range   WBC 4.2 4.0 - 10.5 K/uL   RBC 4.23 3.87 - 5.11 MIL/uL   Hemoglobin 10.5 (L) 12.0 - 15.0 g/dL   HCT 63.8 63.9 - 53.9 %   MCV 85.3 80.0 - 100.0 fL   MCH 24.8 (L) 26.0 - 34.0 pg   MCHC 29.1 (L) 30.0 - 36.0 g/dL   RDW 80.0 (H) 88.4 - 84.4 %   Platelets 292 150 - 400 K/uL   nRBC 0.0 0.0 - 0.2 %    No results found.  Assessment/Plan: 54bn [id number] presenting for scheduled LAVH/BS/possible TAH/possible cystoscopy d/t longstanding  menorrhagia desiring definitive mgmt.  Medical clearance obtained d/t h/o HTN controlled on BP med.  Risks benefits and alternatives have been discussed with the patient including but not limited bleeding infection and injury.  Questions answered and consent signed and witnessed.  Jon CINDERELLA Rummer 10/28/2024, 9:04 AM

## 2024-10-28 NOTE — Anesthesia Preprocedure Evaluation (Signed)
 Anesthesia Evaluation  Patient identified by MRN, date of birth, ID band Patient awake    Reviewed: Allergy & Precautions, NPO status , Patient's Chart, lab work & pertinent test results  Airway Mallampati: II  TM Distance: >3 FB Neck ROM: Full    Dental no notable dental hx. (+) Teeth Intact, Caps, Dental Advisory Given   Pulmonary neg pulmonary ROS   Pulmonary exam normal breath sounds clear to auscultation       Cardiovascular hypertension, Pt. on medications Normal cardiovascular exam+ Valvular Problems/Murmurs  Rhythm:Regular Rate:Normal  EKG 07/26/24 NSR  Echo 08/24/24 1. Left ventricular ejection fraction, by estimation, is 55 to 60%. Left  ventricular ejection fraction by 3D volume is 55 %. The left ventricle has  normal function. The left ventricle has no regional wall motion  abnormalities. Left ventricular diastolic   parameters were normal. The average left ventricular global longitudinal  strain is -27.7 %. The global longitudinal strain is normal.   2. Right ventricular systolic function is normal. The right ventricular  size is normal. There is normal pulmonary artery systolic pressure. The  estimated right ventricular systolic pressure is 24.9 mmHg.   3. The mitral valve is grossly normal. Trivial mitral valve  regurgitation.   4. The aortic valve is tricuspid. Aortic valve regurgitation is not  visualized.   5. The inferior vena cava is normal in size with greater than 50%  respiratory variability, suggesting right atrial pressure of 3 mmHg.     Neuro/Psych  Headaches PSYCHIATRIC DISORDERS Anxiety Depression       GI/Hepatic negative GI ROS, Neg liver ROS,,,  Endo/Other  Obesity Pre diabetes GLP-1 RA therapy-last dose 11/10  Renal/GU negative Renal ROSLab Results      Component                Value               Date                      NA                       140                 10/04/2024                 CL                       104                 10/04/2024                K                        4.4                 10/04/2024                CO2                      21                  10/04/2024                BUN                      17  10/04/2024                CREATININE               0.72                10/04/2024                EGFR                     106                 10/04/2024                CALCIUM                  9.3                 10/04/2024                PHOS                     4.0                 11/25/2023                ALBUMIN                   4.4                 10/04/2024                GLUCOSE                  88                  10/04/2024             negative genitourinary   Musculoskeletal negative musculoskeletal ROS (+)    Abdominal  (+) + obese  Peds  Hematology  (+) Blood dyscrasia, anemia Lab Results      Component                Value               Date                      WBC                      3.6 (L)             10/20/2024                HGB                      10.2 (L)            10/20/2024                HCT                      33.7 (L)            10/20/2024                MCV                      78.7 (L)            10/20/2024  PLT                      299                 10/20/2024              Anesthesia Other Findings   Reproductive/Obstetrics Adenomyosis Menorrhagia                              Anesthesia Physical Anesthesia Plan  ASA: 2  Anesthesia Plan: General   Post-op Pain Management: Dilaudid  IV, Precedex  and Tylenol  PO (pre-op)*   Induction: Intravenous  PONV Risk Score and Plan: 4 or greater and Treatment may vary due to age or medical condition, Midazolam , Scopolamine  patch - Pre-op, Dexamethasone  and Ondansetron   Airway Management Planned: Oral ETT  Additional Equipment: None  Intra-op Plan:   Post-operative Plan: Extubation in  OR  Informed Consent: I have reviewed the patients History and Physical, chart, labs and discussed the procedure including the risks, benefits and alternatives for the proposed anesthesia with the patient or authorized representative who has indicated his/her understanding and acceptance.     Dental advisory given  Plan Discussed with: CRNA and Anesthesiologist  Anesthesia Plan Comments:          Anesthesia Quick Evaluation

## 2024-10-28 NOTE — Plan of Care (Signed)

## 2024-10-29 ENCOUNTER — Encounter (HOSPITAL_COMMUNITY): Payer: Self-pay | Admitting: Obstetrics and Gynecology

## 2024-10-29 DIAGNOSIS — D259 Leiomyoma of uterus, unspecified: Secondary | ICD-10-CM | POA: Diagnosis not present

## 2024-10-29 LAB — BASIC METABOLIC PANEL WITH GFR
Anion gap: 11 (ref 5–15)
BUN: 7 mg/dL (ref 6–20)
CO2: 23 mmol/L (ref 22–32)
Calcium: 9 mg/dL (ref 8.9–10.3)
Chloride: 105 mmol/L (ref 98–111)
Creatinine, Ser: 0.74 mg/dL (ref 0.44–1.00)
GFR, Estimated: >60 mL/min (ref >60)
Glucose, Bld: 90 mg/dL (ref 70–99)
Potassium: 3.5 mmol/L (ref 3.5–5.1)
Sodium: 139 mmol/L (ref 135–145)

## 2024-10-29 LAB — CBC
HCT: 27.4 % — ABNORMAL LOW (ref 36.0–46.0)
Hemoglobin: 8.3 g/dL — ABNORMAL LOW (ref 12.0–15.0)
MCH: 24.5 pg — ABNORMAL LOW (ref 26.0–34.0)
MCHC: 30.3 g/dL (ref 30.0–36.0)
MCV: 80.8 fL (ref 80.0–100.0)
Platelets: 326 K/uL (ref 150–400)
RBC: 3.39 MIL/uL — ABNORMAL LOW (ref 3.87–5.11)
RDW: 19.9 % — ABNORMAL HIGH (ref 11.5–15.5)
WBC: 7.3 K/uL (ref 4.0–10.5)
nRBC: 0 % (ref 0.0–0.2)

## 2024-10-29 MED ORDER — IBUPROFEN 600 MG PO TABS: 600.0000 mg | ORAL_TABLET | Freq: Four times a day (QID) | ORAL | 1 refills | Status: AC | PRN

## 2024-10-29 MED ORDER — OXYCODONE HCL 5 MG PO TABS: 5.0000 mg | ORAL_TABLET | ORAL | 0 refills | Status: AC | PRN

## 2024-10-29 MED ORDER — ACETAMINOPHEN 500 MG PO TABS: 1000.0000 mg | ORAL_TABLET | Freq: Four times a day (QID) | ORAL | 1 refills | Status: AC | PRN

## 2024-10-29 NOTE — Plan of Care (Signed)
  Problem: Education: Goal: Knowledge of General Education information will improve Description: Including pain rating scale, medication(s)/side effects and non-pharmacologic comfort measures Outcome: Completed/Met   Problem: Health Behavior/Discharge Planning: Goal: Ability to manage health-related needs will improve Outcome: Completed/Met   Problem: Clinical Measurements: Goal: Ability to maintain clinical measurements within normal limits will improve Outcome: Completed/Met Goal: Will remain free from infection Outcome: Completed/Met Goal: Diagnostic test results will improve Outcome: Completed/Met Goal: Respiratory complications will improve Outcome: Completed/Met Goal: Cardiovascular complication will be avoided Outcome: Completed/Met   Problem: Activity: Goal: Risk for activity intolerance will decrease Outcome: Completed/Met   Problem: Nutrition: Goal: Adequate nutrition will be maintained Outcome: Completed/Met   Problem: Coping: Goal: Level of anxiety will decrease Outcome: Completed/Met   Problem: Elimination: Goal: Will not experience complications related to bowel motility Outcome: Completed/Met Goal: Will not experience complications related to urinary retention Outcome: Completed/Met   Problem: Pain Managment: Goal: General experience of comfort will improve and/or be controlled Outcome: Completed/Met   Problem: Safety: Goal: Ability to remain free from injury will improve Outcome: Completed/Met   Problem: Skin Integrity: Goal: Risk for impaired skin integrity will decrease Outcome: Completed/Met   Problem: Education: Goal: Knowledge of the prescribed therapeutic regimen will improve Outcome: Completed/Met Goal: Understanding of sexual limitations or changes related to disease process or condition will improve Outcome: Completed/Met Goal: Individualized Educational Video(s) Outcome: Completed/Met   Problem: Self-Concept: Goal: Communication of  feelings regarding changes in body function or appearance will improve Outcome: Completed/Met   Problem: Skin Integrity: Goal: Demonstration of wound healing without infection will improve Outcome: Completed/Met

## 2024-10-29 NOTE — Discharge Summary (Signed)
 Physician Discharge Summary  Patient ID: Patricia Lin MRN: [id number] DOB/AGE: 1981/07/15 43 y.o.  Admit date: 10/28/2024 Discharge date: 10/29/2024  Admission Diagnoses: Menorrhagia Symptomatic Fibroids Discharge Diagnoses:  Principal Problem:   Menorrhagia with irregular cycle Active Problems:   S/P laparoscopic assisted vaginal hysterectomy (LAVH)   Discharged Condition: good  Hospital Course: Uneventful surgery and ready for discharge on POD #1  Consults: None  Significant Diagnostic Studies: labs: Hgb 8.3  Treatments: surgery: LAVH/BS/Cystoscopy  Discharge Exam: Blood pressure 120/74, pulse 81, temperature 98.2 F (36.8 C), temperature source Oral, resp. rate 14, height 5' 2 (1.575 m), weight 81.2 kg, last menstrual period 10/20/2024, SpO2 100%. General appearance: alert, cooperative, and no distress Resp: clear to auscultation bilaterally Cardio: regular rate and rhythm Extremities: no calf tenderness Incision/Wound: Dressings c/d/i  Disposition: Discharge disposition: 01-Home or Self Care        Allergies as of 10/29/2024       Reactions   Latex Itching, Rash        Medication List     STOP taking these medications    nystatin -triamcinolone  ointment Commonly known as: MYCOLOG       TAKE these medications    acetaminophen  500 MG tablet Commonly known as: TYLENOL  Take 2 tablets (1,000 mg total) by mouth every 6 (six) hours as needed for moderate pain (pain score 4-6) or mild pain (pain score 1-3). Do not take within 6hrs of excedrin   aspirin-acetaminophen -caffeine  250-250-65 MG tablet Commonly known as: EXCEDRIN MIGRAINE Take by mouth every 6 (six) hours as needed for headache.   BD Pen Needle Nano 2nd Gen 32G X 4 MM Misc Generic drug: Insulin  Pen Needle Use 1 needle daily to inject medication.   ibuprofen  600 MG tablet Commonly known as: ADVIL  Take 1 tablet (600 mg total) by mouth every 6 (six) hours as needed.   Iron  (Ferrous  Sulfate) 325 (65 Fe) MG Tabs Take 325 mg by mouth daily. What changed: when to take this   oxyCODONE  5 MG immediate release tablet Commonly known as: Oxy IR/ROXICODONE  Take 1-2 tablets (5-10 mg total) by mouth every 4 (four) hours as needed for severe pain (pain score 7-10) or breakthrough pain.   semaglutide -weight management 1.7 MG/0.75ML Soaj SQ injection Commonly known as: WEGOVY  Inject 1.7 mg into the skin once a week for 28 days. What changed: additional instructions   valsartan  80 MG tablet Commonly known as: DIOVAN  Take 1 tablet (80 mg total) by mouth daily. What changed: when to take this   Vitamin D  (Ergocalciferol ) 1.25 MG (50000 UNIT) Caps capsule Commonly known as: DRISDOL  Take 1 capsule (50,000 Units total) by mouth every 7 (seven) days. What changed: additional instructions        Follow-up Information     Henry Slough, MD. Schedule an appointment as soon as possible for a visit in 6 week(s).   Specialty: Obstetrics and Gynecology Why: for post op appointment Contact information: [address] [place] [address] [phone]                 Signed: Slough CINDERELLA Henry 10/29/2024, 11:09 AM

## 2024-10-29 NOTE — Plan of Care (Signed)

## 2024-10-29 NOTE — Progress Notes (Signed)
 Pt states passing flatus. States she is ready to go home. Discharge education provided per MD order. No further questions from patient. States understanding.

## 2024-10-31 ENCOUNTER — Ambulatory Visit: Payer: Self-pay | Admitting: Family

## 2024-10-31 LAB — SURGICAL PATHOLOGY

## 2024-11-08 ENCOUNTER — Other Ambulatory Visit (INDEPENDENT_AMBULATORY_CARE_PROVIDER_SITE_OTHER): Payer: Self-pay | Admitting: Family Medicine

## 2024-11-08 DIAGNOSIS — E669 Obesity, unspecified: Secondary | ICD-10-CM

## 2024-11-09 ENCOUNTER — Encounter: Payer: Self-pay | Admitting: Family

## 2024-11-09 ENCOUNTER — Ambulatory Visit (INDEPENDENT_AMBULATORY_CARE_PROVIDER_SITE_OTHER): Admitting: Family

## 2024-11-09 VITALS — BP 133/96 | HR 77 | Temp 98.1°F | Resp 16 | Ht 62.0 in | Wt 181.0 lb

## 2024-11-09 DIAGNOSIS — I10 Essential (primary) hypertension: Secondary | ICD-10-CM | POA: Diagnosis not present

## 2024-11-09 MED ORDER — VALSARTAN 160 MG PO TABS: 160.0000 mg | ORAL_TABLET | Freq: Every day | ORAL | 0 refills | Status: AC

## 2024-11-09 NOTE — Progress Notes (Signed)
 Patient ID: Patricia Lin, female    DOB: [birth year]  MRN: [id number]  CC: Chronic Conditions Follow-Up  Subjective: Patricia Lin is a 43 y.o. female who presents for chronic conditions follow-up.   Her concerns today include:  - Doing well on Valsartan , no issues/concerns. Home blood pressures above goal. States she eats bacon daily. She does not complain of red flag symptoms such as but not limited to chest pain, shortness of breath, worst headache of life, nausea/vomiting.   Patient Active Problem List   Diagnosis Date Noted   Menorrhagia with irregular cycle 10/28/2024   S/P laparoscopic assisted vaginal hysterectomy (LAVH) 10/28/2024   Panniculitis 06/29/2024   Back pain 06/29/2024   Other insomnia 07/01/2023   BMI 35.0-35.9,adult 01/22/2023   Obesity, Beginning BMI 34.93 01/22/2023   Binge-Eating Disorder, Mild 09/29/2022   Prediabetes 08/12/2022   Depression 08/12/2022   Class 1 obesity with serious comorbidity and body mass index (BMI) of 34.0 to 34.9 in adult 08/12/2022   Iron  deficiency anemia 07/29/2022   Insulin  resistance 06/28/2022   Essential hypertension 06/28/2022   Iron  deficiency 06/28/2022   Vitamin D  deficiency 06/28/2022   Other hyperlipidemia 06/28/2022   At risk of diabetes mellitus 06/28/2022   Iron  deficiency anemia due to chronic blood loss 05/07/2022   Right lower quadrant abdominal pain 01/28/2019   Generalized headaches 12/17/2018   Anxiety 12/17/2018   History of anemia 05/26/2018   Anemia 12/05/2017   Routine health maintenance 03/03/2013   S/P tubal ligation 02/09/2013   Vaginal delivery 01/09/2013   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   Fracture of left ulna 09/27/12 10/23/2012   Cervical funneling 09/18/2012   Cervical shortening 09/18/2012   Symptomatic anemia 09/03/2012   H/O pre-eclampsia in prior pregnancy, currently pregnant 08/19/2012   Chronic hypertension in pregnancy 08/19/2012   Hx LEEP (loop electrosurgical excision procedure),  cervix, pregnancy 08/19/2012   Hx of postpartum depression, currently pregnant 08/19/2012   Late prenatal care 08/19/2012   Hx of precipitous labor and deliveries, antepartum 08/19/2012   Muscle tension HAs 08/19/2012     Current Outpatient Medications on File Prior to Visit  Medication Sig Dispense Refill   acetaminophen  (TYLENOL ) 500 MG tablet Take 2 tablets (1,000 mg total) by mouth every 6 (six) hours as needed for moderate pain (pain score 4-6) or mild pain (pain score 1-3). Do not take within 6hrs of excedrin 60 tablet 1   aspirin-acetaminophen -caffeine  (EXCEDRIN MIGRAINE) 250-250-65 MG tablet Take by mouth every 6 (six) hours as needed for headache.     ibuprofen  (ADVIL ) 600 MG tablet Take 1 tablet (600 mg total) by mouth every 6 (six) hours as needed. 40 tablet 1   Insulin  Pen Needle (BD PEN NEEDLE NANO 2ND GEN) 32G X 4 MM MISC Use 1 needle daily to inject medication. 100 each 0   Iron , Ferrous Sulfate , 325 (65 Fe) MG TABS Take 325 mg by mouth daily. (Patient taking differently: Take 325 mg by mouth 3 (three) times daily with meals.) 90 tablet 0   oxyCODONE  (OXY IR/ROXICODONE ) 5 MG immediate release tablet Take 1-2 tablets (5-10 mg total) by mouth every 4 (four) hours as needed for severe pain (pain score 7-10) or breakthrough pain. 25 tablet 0   valsartan  (DIOVAN ) 80 MG tablet Take 1 tablet (80 mg total) by mouth daily. (Patient taking differently: Take 80 mg by mouth daily with lunch.) 90 tablet 2   semaglutide -weight management (WEGOVY ) 1.7 MG/0.75ML SOAJ SQ injection Inject 1.7 mg into the skin  once a week for 28 days. (Patient taking differently: Inject 1.7 mg into the skin once a week. Monday evening's) 3 mL 0   Vitamin D , Ergocalciferol , (DRISDOL ) 1.25 MG (50000 UNIT) CAPS capsule Take 1 capsule (50,000 Units total) by mouth every 7 (seven) days. (Patient not taking: Reported on 11/09/2024) 4 capsule 0   No current facility-administered medications on file prior to visit.     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  Tobacco Use   Smoking status: Never   Smokeless tobacco: Never  Vaping Use   Vaping status: Never Used  Substance and Sexual Activity   Alcohol  use: Yes    Alcohol /week: 3.0 standard drinks of alcohol     Types: 3 Standard drinks or equivalent per week    Comment: socially   Drug use: Never   Sexual activity: Yes    Partners: Male    Birth control/protection: Surgical    Comment: PPTL  Other Topics Concern   Not on file  Social History Narrative   Not on file   Social Drivers of Health   Financial Resource Strain: Medium Risk (10/03/2024)   Overall Financial Resource Strain (CARDIA)    Difficulty of Paying Living Expenses: Somewhat hard  Food Insecurity: No Food Insecurity (10/28/2024)   Hunger Vital Sign    Worried About Running Out of Food in the Last Year: Never true    Ran Out of Food in the Last Year: Never true  Recent Concern: Food Insecurity - Food Insecurity Present (10/03/2024)   Hunger Vital Sign    Worried About Running Out of Food in the Last Year: Sometimes true    Ran Out of Food in the Last Year: Sometimes true  Transportation Needs: No Transportation Needs (10/28/2024)   PRAPARE - Administrator, Civil Service (Medical): No    Lack of Transportation (Non-Medical): No  Physical Activity: Sufficiently Active (10/03/2024)   Exercise Vital Sign    Days of Exercise per Week: 4 days    Minutes of Exercise per Session: 40 min  Stress: No Stress Concern Present (10/03/2024)   [employer] of Occupational Health - Occupational Stress  Questionnaire    Feeling of Stress: Only a little  Social Connections: Unknown (10/03/2024)   Social Connection and Isolation Panel    Frequency of Communication with Friends and Family: More than three times a week    Frequency of Social Gatherings with Friends and Family: Once a week    Attends Religious Services: 1 to 4 times per year    Active Member of [employer] or Organizations: No    Attends Banker Meetings: Not on file    Marital Status: Not on file  Intimate Partner Violence: Not At Risk (10/28/2024)   Humiliation, Afraid, Rape, and Kick questionnaire    Fear of Current or Ex-Partner: No    Emotionally Abused: No    Physically Abused: No    Sexually Abused: No    Family History  Problem Relation Age of Onset   Hypertension Mother    Hyperlipidemia Mother  Coronary artery disease Mother    Heart disease Mother        high cholesterol   Asthma Mother    Diabetes Mother    Depression Mother    Anxiety disorder Mother    Sleep apnea Mother    Obesity Mother    Eating disorder Mother    Hypertension Father    High Cholesterol Father    Heart disease Father    Hypertension Maternal Grandmother    Hypertension Maternal Grandfather    Asthma Daughter    Alcohol  abuse Maternal Uncle    Drug abuse Maternal Uncle     Past Surgical History:  Procedure Laterality Date   BREAST REDUCTION SURGERY Bilateral 2008   CERVICAL BIOPSY  W/ LOOP ELECTRODE EXCISION  10/16/2008   @[facility]   by Dr DELENA. Henry;  top hat(endocervial )   COLONOSCOPY WITH PROPOFOL   08/2024   Dr Kristie   CYSTOSCOPY N/A 10/28/2024   Procedure: CYSTOSCOPY;  Surgeon: Henry Slough, MD;  Location: [facility] OR;  Service: Gynecology;  Laterality: N/A;   DILATION AND EVACUATION  01/29/2011   @[facility]   by Dr M. Lavoie;   FINGER SURGERY Right 2001   Right index finger tendon repair   LAPAROSCOPIC ASSISTED VAGINAL HYSTERECTOMY N/A 10/28/2024   Procedure: HYSTERECTOMY, VAGINAL, LAPAROSCOPY-ASSISTED;  Surgeon:  Henry Slough, MD;  Location: [facility] OR;  Service: Gynecology;  Laterality: N/A;   LAPAROSCOPIC BILATERAL SALPINGECTOMY  10/28/2024   Procedure: SALPINGECTOMY, BILATERAL, LAPAROSCOPIC;  Surgeon: Henry Slough, MD;  Location: [facility] OR;  Service: Gynecology;;   LEEP  07/25/2008   @[facility]   by Dr A. Henry   TUBAL LIGATION  01/09/2013   Procedure: POST PARTUM TUBAL LIGATION;  Surgeon: Ovid DELENA All, MD;  Location: [facility] ORS;  Service: Gynecology;  Laterality: Bilateral;  post partum tubal ligation bilateral   WISDOM TOOTH EXTRACTION      ROS: Review of Systems Negative except as stated above  PHYSICAL EXAM: BP (!) 133/96   Pulse 77   Temp 98.1 F (36.7 C) (Oral)   Resp 16   Ht 5' 2 (1.575 m)   Wt 181 lb (82.1 kg)   LMP 10/21/2024 (Approximate)   SpO2 98%   BMI 33.11 kg/m   Physical Exam HENT:     Head: Normocephalic and atraumatic.     Nose: Nose normal.     Mouth/Throat:     Mouth: Mucous membranes are moist.     Pharynx: Oropharynx is clear.  Eyes:     Extraocular Movements: Extraocular movements intact.     Conjunctiva/sclera: Conjunctivae normal.     Pupils: Pupils are equal, round, and reactive to light.  Cardiovascular:     Rate and Rhythm: Normal rate and regular rhythm.     Pulses: Normal pulses.     Heart sounds: Normal heart sounds.  Pulmonary:     Effort: Pulmonary effort is normal.     Breath sounds: Normal breath sounds.  Musculoskeletal:        General: Normal range of motion.     Cervical back: Normal range of motion and neck supple.  Neurological:     General: No focal deficit present.     Mental Status: She is alert and oriented to person, place, and time.  Psychiatric:        Mood and Affect: Mood normal.        Behavior: Behavior normal.     ASSESSMENT AND PLAN: 1. Primary hypertension (Primary) - Blood pressure not at goal during today's  visit. Patient asymptomatic without chest pressure, chest pain, palpitations, shortness of breath, worst  headache of life, and any additional red flag symptoms. - Increase Valsartan  from 80 mg to 160 mg as prescribed.  - Counseled on blood pressure goal of less than 130/80, low-sodium, DASH diet, medication compliance, and 150 minutes of moderate intensity exercise per week as tolerated. Counseled on medication adherence and adverse effects. - Keep all scheduled appointments with established Cardiology.  - Follow-up with primary provider as scheduled.  - valsartan  (DIOVAN ) 160 MG tablet; Take 1 tablet (160 mg total) by mouth daily.  Dispense: 90 tablet; Refill: 0   Patient was given the opportunity to ask questions.  Patient verbalized understanding of the plan and was able to repeat key elements of the plan. Patient was given clear instructions to go to Emergency Department or return to medical center if symptoms don't improve, worsen, or new problems develop.The patient verbalized understanding.    Requested Prescriptions   Signed Prescriptions Disp Refills   valsartan  (DIOVAN ) 160 MG tablet 90 tablet 0    Sig: Take 1 tablet (160 mg total) by mouth daily.    Return for Follow-up as needed.  Greig JINNY Chute, NP

## 2024-11-10 ENCOUNTER — Ambulatory Visit (INDEPENDENT_AMBULATORY_CARE_PROVIDER_SITE_OTHER): Payer: Self-pay | Admitting: Family Medicine

## 2024-11-10 ENCOUNTER — Encounter (INDEPENDENT_AMBULATORY_CARE_PROVIDER_SITE_OTHER): Payer: Self-pay | Admitting: Family Medicine

## 2024-11-10 VITALS — BP 134/87 | HR 82 | Temp 98.1°F | Ht 62.0 in | Wt 177.0 lb

## 2024-11-10 DIAGNOSIS — Z9889 Other specified postprocedural states: Secondary | ICD-10-CM

## 2024-11-10 DIAGNOSIS — I10 Essential (primary) hypertension: Secondary | ICD-10-CM | POA: Diagnosis not present

## 2024-11-10 DIAGNOSIS — Z6832 Body mass index (BMI) 32.0-32.9, adult: Secondary | ICD-10-CM | POA: Diagnosis not present

## 2024-11-10 DIAGNOSIS — E559 Vitamin D deficiency, unspecified: Secondary | ICD-10-CM

## 2024-11-10 DIAGNOSIS — E669 Obesity, unspecified: Secondary | ICD-10-CM | POA: Diagnosis not present

## 2024-11-10 MED ORDER — VITAMIN D (ERGOCALCIFEROL) 1.25 MG (50000 UNIT) PO CAPS: 50000.0000 [IU] | ORAL_CAPSULE | ORAL | 0 refills | Status: DC

## 2024-11-10 MED ORDER — SEMAGLUTIDE-WEIGHT MANAGEMENT 1.7 MG/0.75ML ~~LOC~~ SOAJ: 1.7000 mg | SUBCUTANEOUS | 0 refills | Status: AC

## 2024-11-10 NOTE — Progress Notes (Signed)
 Office: [phone]  /  Fax: [phone]  WEIGHT SUMMARY AND BIOMETRICS  Anthropometric Measurements Height: 5' 2 (1.575 m) Weight: 177 lb (80.3 kg) BMI (Calculated): 32.37 Weight at Last Visit: 179 lb Weight Lost Since Last Visit: 2 lb Weight Gained Since Last Visit: 0 Starting Weight: 191 lb Total Weight Loss (lbs): 14 lb (6.35 kg) Peak Weight: 207 lb   Body Composition  Body Fat %: 36.8 % Fat Mass (lbs): 65.4 lbs Muscle Mass (lbs): 106.6 lbs Total Body Water (lbs): 68.6 lbs Visceral Fat Rating : 8   Other Clinical Data Fasting: yes Labs: no Today's Visit #: 32 Starting Date: 09/08/18    Chief Complaint: OBESITY   History of Present Illness Patricia Lin is a 43 year old female who presents for obesity treatment and progress assessment.  She is currently on Wegovy , 1.7 mg, for weight loss and has lost two pounds over the last month. She has been prescribed the category one eating plan but has struggled to adhere to it due to a recent hysterectomy on November 21st and the Thanksgiving holiday.  She experiences gas pains and general discomfort post-surgery, which she attributes to the surgical procedure. Food intake exacerbates her gassiness, particularly when consuming greasy foods. Her eating pattern has been irregular, often skipping lunch and compensating with a larger dinner, which may contribute to gastrointestinal issues. No issues with bowel movements.  She has a history of hypertension, currently managed with valsartan , 160 mg, which was recently increased. She also has a vitamin D  deficiency and is on prescription ergocalciferol , 50,000 IU weekly. She requests a refill and notes that her vitamin D  levels have not been checked for some time.  Socially, she is currently on disability leave, which impacts her financial situation, including having to pay for her own insurance out of pocket. She anticipates a 'light' Christmas due to these financial  constraints.      PHYSICAL EXAM:  Blood pressure 134/87, pulse 82, temperature 98.1 F (36.7 C), height 5' 2 (1.575 m), weight 177 lb (80.3 kg), last menstrual period 10/21/2024, SpO2 99%. Body mass index is 32.37 kg/m.  DIAGNOSTIC DATA REVIEWED:  BMET    Component Value Date/Time   NA 139 10/29/2024 0424   NA 140 10/04/2024 0835   K 3.5 10/29/2024 0424   CL 105 10/29/2024 0424   CO2 23 10/29/2024 0424   GLUCOSE 90 10/29/2024 0424   BUN 7 10/29/2024 0424   BUN 17 10/04/2024 0835   CREATININE 0.74 10/29/2024 0424   CREATININE 0.71 06/06/2024 0846   CREATININE 0.64 01/17/2013 1720   CALCIUM 9.0 10/29/2024 0424   GFRNONAA >60 10/29/2024 0424   GFRNONAA >60 06/06/2024 0846   GFRAA >60 09/04/2019 0130   Lab Results  Component Value Date   HGBA1C 5.2 10/04/2024   HGBA1C 5.2 09/08/2018   Lab Results  Component Value Date   INSULIN  16.3 11/25/2023   INSULIN  11.5 09/08/2018   Lab Results  Component Value Date   TSH 0.918 05/09/2024   CBC    Component Value Date/Time   WBC 7.3 10/29/2024 0424   RBC 3.39 (L) 10/29/2024 0424   HGB 8.3 (L) 10/29/2024 0424   HGB 10.2 (L) 10/20/2024 0953   HGB 8.7 (L) 10/04/2024 0835   HCT 27.4 (L) 10/29/2024 0424   HCT 29.6 (L) 10/04/2024 0835   PLT 326 10/29/2024 0424   PLT 299 10/20/2024 0953   PLT 362 10/04/2024 0835   MCV 80.8 10/29/2024 0424   MCV  81 10/04/2024 0835   MCH 24.5 (L) 10/29/2024 0424   MCHC 30.3 10/29/2024 0424   RDW 19.9 (H) 10/29/2024 0424   RDW 14.8 10/04/2024 0835   Iron  Studies    Component Value Date/Time   IRON  54 10/20/2024 0953   IRON  13 (L) 05/09/2024 1102   TIBC 365 10/20/2024 0953   TIBC 398 05/09/2024 1102   FERRITIN 299 10/20/2024 0953   FERRITIN 5 (L) 05/09/2024 1102   IRONPCTSAT 15 10/20/2024 0953   IRONPCTSAT 3 (LL) 05/09/2024 1102   Lipid Panel     Component Value Date/Time   CHOL 199 05/09/2024 1102   TRIG 67 05/09/2024 1102   HDL 75 05/09/2024 1102   CHOLHDL 2.7 05/09/2024  1102   LDLCALC 112 (H) 05/09/2024 1102   Hepatic Function Panel     Component Value Date/Time   PROT 7.0 10/04/2024 0835   ALBUMIN  4.4 10/04/2024 0835   AST 22 10/04/2024 0835   AST 15 06/06/2024 0846   ALT 25 10/04/2024 0835   ALT 16 06/06/2024 0846   ALKPHOS 73 10/04/2024 0835   BILITOT 0.5 10/04/2024 0835   BILITOT 0.5 06/06/2024 0846      Component Value Date/Time   TSH 0.918 05/09/2024 1102   Nutritional Lab Results  Component Value Date   VD25OH 30.3 11/25/2023   VD25OH 23.2 (L) 11/11/2022   VD25OH 22.2 (L) 06/11/2022     Assessment and Plan Assessment & Plan Obesity Management with Wegovy  1.7 mg. Recent weight loss of 2 pounds over the last month. Difficulty following the category one eating plan due to recent surgery and Thanksgiving. Reports gassiness, likely related to recent surgery rather than Wegovy . Prefers to maintain current Wegovy  dose despite GI symptoms. - Continue Wegovy  1.7 mg. - Switched to vegetarian eating plan. - Ensure intake of 75 grams of protein per day. - Encouraged regular meals with protein at each meal.  Hypertension Managed with valsartan  160 mg, recently increased by primary care provider. Blood pressure well-controlled at 134/87 mmHg. Dietary habits include high sodium intake from bacon, which may affect blood pressure control. - Continue valsartan  160 mg. - Encouraged reduction of sodium intake.  Vitamin D  deficiency Managed with ergocalciferol  50,000 IU weekly. Vitamin D  levels have not been checked recently, increasing risk of over-replacement. - Ordered vitamin D  level test. - Continue ergocalciferol  50,000 IU weekly.  Postoperative state following hysterectomy Recent hysterectomy on November 21st. Experiencing gas pains and occasional pain, likely due to surgical manipulation and gas used during surgery. Healing is progressing, but dietary adjustments are needed to manage symptoms. - Encouraged intake of cooked vegetables to  reduce gas. - Advised regular fiber intake. - Recommended Gas-X for gas relief.   Patients who are on anti-obesity medications are counseled on the importance of maintaining healthy lifestyle habits, including balanced nutrition, regular physical activity, and behavioral modifications,  Medication is an adjunct to, not a replacement for, lifestyle changes and that the Reeb-term success and weight maintenance depend on continued adherence to these strategies.   Patricia Lin was informed of the importance of frequent follow up visits to maximize her success with intensive lifestyle modifications for her obesity and obesity related health conditions as recommended by USPSTF and CMS guidelines  Louann Penton, MD

## 2024-11-11 LAB — VITAMIN D 25 HYDROXY (VIT D DEFICIENCY, FRACTURES): Vit D, 25-Hydroxy: 43.4 ng/mL (ref 30.0–100.0)

## 2024-12-01 ENCOUNTER — Telehealth: Admitting: Physician Assistant

## 2024-12-01 DIAGNOSIS — B9689 Other specified bacterial agents as the cause of diseases classified elsewhere: Secondary | ICD-10-CM

## 2024-12-01 DIAGNOSIS — J208 Acute bronchitis due to other specified organisms: Secondary | ICD-10-CM | POA: Diagnosis not present

## 2024-12-01 MED ORDER — GUAIFENESIN ER 600 MG PO TB12: 1200.0000 mg | ORAL_TABLET | Freq: Two times a day (BID) | ORAL | 0 refills | Status: AC | PRN

## 2024-12-01 MED ORDER — PROMETHAZINE-DM 6.25-15 MG/5ML PO SYRP: 5.0000 mL | ORAL_SOLUTION | Freq: Every evening | ORAL | 0 refills | Status: AC | PRN

## 2024-12-01 MED ORDER — AZITHROMYCIN 250 MG PO TABS: ORAL_TABLET | ORAL | 0 refills | Status: AC

## 2024-12-01 MED ORDER — ALBUTEROL SULFATE HFA 108 (90 BASE) MCG/ACT IN AERS: 1.0000 | INHALATION_SPRAY | Freq: Four times a day (QID) | RESPIRATORY_TRACT | 0 refills | Status: AC | PRN

## 2024-12-01 NOTE — Patient Instructions (Signed)
 " Patricia Lin, thank you for joining Patricia Lin, Patricia Lin for today's virtual visit.  While this provider is not your primary care provider (PCP), if your PCP is located in our provider database this encounter information will be shared with them immediately following your visit.   A [facility] MyChart account gives you access to today's visit and all your visits, tests, and labs performed at [facility]  click here if you don't have a [facility] MyChart account or go to [email or web address]  Consent: (Patient) Patricia Lin provided verbal consent for this virtual visit at the beginning of the encounter.  Current Medications:  Current Outpatient Medications:    albuterol  (VENTOLIN  HFA) 108 (90 Base) MCG/ACT inhaler, Inhale 1-2 puffs into the lungs every 6 (six) hours as needed., Disp: 8 g, Rfl: 0   azithromycin  (ZITHROMAX ) 250 MG tablet, Take 2 tablets on day 1, then 1 tablet daily on days 2 through 5, Disp: 6 tablet, Rfl: 0   guaiFENesin  (MUCINEX ) 600 MG 12 hr tablet, Take 2 tablets (1,200 mg total) by mouth 2 (two) times daily as needed., Disp: 30 tablet, Rfl: 0   promethazine -dextromethorphan (PROMETHAZINE -DM) 6.25-15 MG/5ML syrup, Take 5 mLs by mouth at bedtime as needed., Disp: 118 mL, Rfl: 0   acetaminophen  (TYLENOL ) 500 MG tablet, Take 2 tablets (1,000 mg total) by mouth every 6 (six) hours as needed for moderate pain (pain score 4-6) or mild pain (pain score 1-3). Do not take within 6hrs of excedrin, Disp: 60 tablet, Rfl: 1   aspirin-acetaminophen -caffeine  (EXCEDRIN MIGRAINE) 250-250-65 MG tablet, Take by mouth every 6 (six) hours as needed for headache., Disp: , Rfl:    ibuprofen  (ADVIL ) 600 MG tablet, Take 1 tablet (600 mg total) by mouth every 6 (six) hours as needed., Disp: 40 tablet, Rfl: 1   Insulin  Pen Needle (BD PEN NEEDLE NANO 2ND GEN) 32G X 4 MM MISC, Use 1 needle daily to inject medication., Disp: 100 each, Rfl: 0   Iron , Ferrous Sulfate , 325 (65 Fe)  MG TABS, Take 325 mg by mouth daily. (Patient taking differently: Take 325 mg by mouth 3 (three) times daily with meals.), Disp: 90 tablet, Rfl: 0   oxyCODONE  (OXY IR/ROXICODONE ) 5 MG immediate release tablet, Take 1-2 tablets (5-10 mg total) by mouth every 4 (four) hours as needed for severe pain (pain score 7-10) or breakthrough pain., Disp: 25 tablet, Rfl: 0   semaglutide -weight management (WEGOVY ) 1.7 MG/0.75ML SOAJ SQ injection, Inject 1.7 mg into the skin once a week for 28 days., Disp: 3 mL, Rfl: 0   valsartan  (DIOVAN ) 160 MG tablet, Take 1 tablet (160 mg total) by mouth daily., Disp: 90 tablet, Rfl: 0   valsartan  (DIOVAN ) 80 MG tablet, Take 1 tablet (80 mg total) by mouth daily. (Patient taking differently: Take 80 mg by mouth daily with lunch.), Disp: 90 tablet, Rfl: 2   Vitamin D , Ergocalciferol , (DRISDOL ) 1.25 MG (50000 UNIT) CAPS capsule, Take 1 capsule (50,000 Units total) by mouth every 7 (seven) days., Disp: 4 capsule, Rfl: 0   Medications ordered in this encounter:  Meds ordered this encounter  Medications   azithromycin  (ZITHROMAX ) 250 MG tablet    Sig: Take 2 tablets on day 1, then 1 tablet daily on days 2 through 5    Dispense:  6 tablet    Refill:  0    Supervising Provider:   LAMPTEY, PHILIP O [[id number]]   promethazine -dextromethorphan (PROMETHAZINE -DM) 6.25-15 MG/5ML syrup    Sig: Take 5  mLs by mouth at bedtime as needed.    Dispense:  118 mL    Refill:  0    Supervising Provider:   BLAISE ALEENE KIDD [id number]   guaiFENesin  (MUCINEX ) 600 MG 12 hr tablet    Sig: Take 2 tablets (1,200 mg total) by mouth 2 (two) times daily as needed.    Dispense:  30 tablet    Refill:  0    Supervising Provider:   LAMPTEY, PHILIP O [[id number]]   albuterol  (VENTOLIN  HFA) 108 (90 Base) MCG/ACT inhaler    Sig: Inhale 1-2 puffs into the lungs every 6 (six) hours as needed.    Dispense:  8 g    Refill:  0    Supervising Provider:   BLAISE ALEENE KIDD [[id number]]     *If you need refills on  other medications prior to your next appointment, please contact your pharmacy*  Follow-Up: Call back or seek an in-person evaluation if the symptoms worsen or if the condition fails to improve as anticipated.  [facility] [phone]  Other Instructions Acute Bronchitis, Adult  Acute bronchitis is sudden inflammation of the main airways (bronchi) that come off the windpipe (trachea) in the lungs. The swelling causes the airways to get smaller and make more mucus than normal. This can make it hard to breathe and can cause coughing or noisy breathing (wheezing). Acute bronchitis may last several weeks. The cough may last longer. Allergies, asthma, and exposure to smoke may make the condition worse. What are the causes? This condition can be caused by germs and by substances that irritate the lungs, including: Cold and flu viruses. The most common cause of this condition is the virus that causes the common cold. Bacteria. This is less common. Breathing in substances that irritate the lungs, including: Smoke from cigarettes and other forms of tobacco. Dust and pollen. Fumes from household cleaning products, gases, or burned fuel. Indoor or outdoor air pollution. What increases the risk? The following factors may make you more likely to develop this condition: A weak body's defense system, also called the immune system. A condition that affects your lungs and breathing, such as asthma. What are the signs or symptoms? Common symptoms of this condition include: Coughing. This may bring up clear, yellow, or green mucus from your lungs (sputum). Wheezing. Runny or stuffy nose. Having too much mucus in your lungs (chest congestion). Shortness of breath. Aches and pains, including sore throat or chest. How is this diagnosed? This condition is usually diagnosed based on: Your symptoms and medical history. A physical exam. You may also have other tests, including tests to  rule out other conditions, such as pneumonia. These tests include: A test of lung function. Test of a mucus sample to look for the presence of bacteria. Tests to check the oxygen level in your blood. Blood tests. Chest X-ray. How is this treated? Most cases of acute bronchitis clear up over time without treatment. Your health care provider may recommend: Drinking more fluids to help thin your mucus so it is easier to cough up. Taking inhaled medicine (inhaler) to improve air flow in and out of your lungs. Using a vaporizer or a humidifier. These are machines that add water to the air to help you breathe better. Taking a medicine that thins mucus and clears congestion (expectorant). Taking a medicine that prevents or stops coughing (cough suppressant). It is not common to take an antibiotic medicine for this condition. Follow these instructions at home:  Take over-the-counter and prescription medicines only as told by your health care provider. Use an inhaler, vaporizer, or humidifier as told by your health care provider. Take two teaspoons (10 mL) of honey at bedtime to lessen coughing at night. Drink enough fluid to keep your urine pale yellow. Do not use any products that contain nicotine or tobacco. These products include cigarettes, chewing tobacco, and vaping devices, such as e-cigarettes. If you need help quitting, ask your health care provider. Get plenty of rest. Return to your normal activities as told by your health care provider. Ask your health care provider what activities are safe for you. Keep all follow-up visits. This is important. How is this prevented? To lower your risk of getting this condition again: Wash your hands often with soap and water for at least 20 seconds. If soap and water are not available, use hand sanitizer. Avoid contact with people who have cold symptoms. Try not to touch your mouth, nose, or eyes with your hands. Avoid breathing in smoke or chemical  fumes. Breathing smoke or chemical fumes will make your condition worse. Get the flu shot every year. Contact a health care provider if: Your symptoms do not improve after 2 weeks. You have trouble coughing up the mucus. Your cough keeps you awake at night. You have a fever. Get help right away if you: Cough up blood. Feel pain in your chest. Have severe shortness of breath. Faint or keep feeling like you are going to faint. Have a severe headache. Have a fever or chills that get worse. These symptoms may represent a serious problem that is an emergency. Do not wait to see if the symptoms will go away. Get medical help right away. Call your local emergency services (911 in the [place] Do not drive yourself to the hospital. Summary Acute bronchitis is inflammation of the main airways (bronchi) that come off the windpipe (trachea) in the lungs. The swelling causes the airways to get smaller and make more mucus than normal. Drinking more fluids can help thin your mucus so it is easier to cough up. Take over-the-counter and prescription medicines only as told by your health care provider. Do not use any products that contain nicotine or tobacco. These products include cigarettes, chewing tobacco, and vaping devices, such as e-cigarettes. If you need help quitting, ask your health care provider. Contact a health care provider if your symptoms do not improve after 2 weeks. This information is not intended to replace advice given to you by your health care provider. Make sure you discuss any questions you have with your health care provider. Document Revised: 03/06/2022 Document Reviewed: 03/27/2021 Elsevier Patient Education  2024 Elsevier Inc.   If you have been instructed to have an in-person evaluation today at a local Urgent Care facility, please use the link below. It will take you to a list of all of our available [facility], including address, phone number and hours of  operation. Please do not delay care.  [facility]  If you or a family member do not have a primary care provider, use the link below to schedule a visit and establish care. When you choose a [facility] primary care physician or advanced practice provider, you gain a [facility]-term partner in health. Find a Primary Care Provider  Learn more about [facility]'s in-office and virtual care options: [place] - Get Care Now "

## 2024-12-01 NOTE — Progress Notes (Signed)
 " Virtual Visit Consent   Patricia Lin, you are scheduled for a virtual visit with a [facility] provider today. Just as with appointments in the office, your consent must be obtained to participate. Your consent will be active for this visit and any virtual visit you may have with one of our providers in the next 365 days. If you have a MyChart account, a copy of this consent can be sent to you electronically.  As this is a virtual visit, video technology does not allow for your provider to perform a traditional examination. This may limit your provider's ability to fully assess your condition. If your provider identifies any concerns that need to be evaluated in person or the need to arrange testing (such as labs, EKG, etc.), we will make arrangements to do so. Although advances in technology are sophisticated, we cannot ensure that it will always work on either your end or our end. If the connection with a video visit is poor, the visit may have to be switched to a telephone visit. With either a video or telephone visit, we are not always able to ensure that we have a secure connection.  By engaging in this virtual visit, you consent to the provision of healthcare and authorize for your insurance to be billed (if applicable) for the services provided during this visit. Depending on your insurance coverage, you may receive a charge related to this service.  I need to obtain your verbal consent now. Are you willing to proceed with your visit today? Patricia Lin has provided verbal consent on 12/01/2024 for a virtual visit (video or telephone). Delon CHRISTELLA Dickinson, PA-C  Date: 12/01/2024 11:47 AM   Virtual Visit via Video Note   I, Delon CHRISTELLA Dickinson, connected with  Patricia Lin  ([id number], 12/04/1981) on 12/01/2024 at 11:30 AM EST by a video-enabled telemedicine application and verified that I am speaking with the correct person using two identifiers.  Location: Patient: Virtual Visit  Location Patient: Home Provider: Virtual Visit Location Provider: Home Office   I discussed the limitations of evaluation and management by telemedicine and the availability of in person appointments. The patient expressed understanding and agreed to proceed.    History of Present Illness: Patricia Lin is a 43 y.o. who identifies as a female who was assigned female at birth, and is being seen today for cough and congestion.  HPI: URI  This is a new problem. The current episode started yesterday. The maximum temperature recorded prior to her arrival was 100.4 - 100.9 F. The fever has been present for Less than 1 day. Associated symptoms include chest pain (chest pain with cough), congestion, coughing, headaches and rhinorrhea. Pertinent negatives include no diarrhea, ear pain, nausea, plugged ear sensation, sinus pain, sore throat, vomiting or wheezing. Associated symptoms comments: Weakness. She has tried NSAIDs (ibuprofen ) for the symptoms. The treatment provided no relief.     Problems:  Patient Active Problem List   Diagnosis Date Noted   Menorrhagia with irregular cycle 10/28/2024   S/P laparoscopic assisted vaginal hysterectomy (LAVH) 10/28/2024   Panniculitis 06/29/2024   Back pain 06/29/2024   Other insomnia 07/01/2023   BMI 35.0-35.9,adult 01/22/2023   Obesity, Beginning BMI 34.93 01/22/2023   Binge-Eating Disorder, Mild 09/29/2022   Prediabetes 08/12/2022   Depression 08/12/2022   Class 1 obesity with serious comorbidity and body mass index (BMI) of 34.0 to 34.9 in adult 08/12/2022   Iron  deficiency anemia 07/29/2022   Insulin  resistance 06/28/2022  Essential hypertension 06/28/2022   Iron  deficiency 06/28/2022   Vitamin D  deficiency 06/28/2022   Other hyperlipidemia 06/28/2022   At risk of diabetes mellitus 06/28/2022   Iron  deficiency anemia due to chronic blood loss 05/07/2022   Right lower quadrant abdominal pain 01/28/2019   Generalized headaches 12/17/2018    Anxiety 12/17/2018   History of anemia 05/26/2018   Anemia 12/05/2017   Routine health maintenance 03/03/2013   S/P tubal ligation 02/09/2013   Vaginal delivery 01/09/2013   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   Fracture of left ulna 09/27/12 10/23/2012   Cervical funneling 09/18/2012   Cervical shortening 09/18/2012   Symptomatic anemia 09/03/2012   H/O pre-eclampsia in prior pregnancy, currently pregnant 08/19/2012   Chronic hypertension in pregnancy 08/19/2012   Hx LEEP (loop electrosurgical excision procedure), cervix, pregnancy 08/19/2012   Hx of postpartum depression, currently pregnant 08/19/2012   Late prenatal care 08/19/2012   Hx of precipitous labor and deliveries, antepartum 08/19/2012   Muscle tension HAs 08/19/2012    Allergies: Allergies[1] Medications: Current Medications[2]  Observations/Objective: Patient is well-developed, well-nourished in no acute distress.  Resting comfortably at home.  Head is normocephalic, atraumatic.  No labored breathing.  Speech is clear and coherent with logical content.  Patient is alert and oriented at baseline.  Frequent coughing heard not affecting speech  Assessment and Plan: 1. Acute bacterial bronchitis (Primary) - azithromycin  (ZITHROMAX ) 250 MG tablet; Take 2 tablets on day 1, then 1 tablet daily on days 2 through 5  Dispense: 6 tablet; Refill: 0 - promethazine -dextromethorphan (PROMETHAZINE -DM) 6.25-15 MG/5ML syrup; Take 5 mLs by mouth at bedtime as needed.  Dispense: 118 mL; Refill: 0 - guaiFENesin  (MUCINEX ) 600 MG 12 hr tablet; Take 2 tablets (1,200 mg total) by mouth 2 (two) times daily as needed.  Dispense: 30 tablet; Refill: 0 - albuterol  (VENTOLIN  HFA) 108 (90 Base) MCG/ACT inhaler; Inhale 1-2 puffs into the lungs every 6 (six) hours as needed.  Dispense: 8 g; Refill: 0  - Worsening over a week despite OTC medications - Will treat with Z-pack, Mucinex , Promethazine  DM (nighttime), and Albuterol  - Push fluids.  - Rest.  -  Steam and humidifier can help - Seek in person evaluation if worsening or symptoms fail to improve    Follow Up Instructions: I discussed the assessment and treatment plan with the patient. The patient was provided an opportunity to ask questions and all were answered. The patient agreed with the plan and demonstrated an understanding of the instructions.  A copy of instructions were sent to the patient via MyChart unless otherwise noted below.    The patient was advised to call back or seek an in-person evaluation if the symptoms worsen or if the condition fails to improve as anticipated.    Candon Caras M Maeci Kalbfleisch, PA-C     [1]  Allergies Allergen Reactions   Latex Itching and Rash  [2]  Current Outpatient Medications:    albuterol  (VENTOLIN  HFA) 108 (90 Base) MCG/ACT inhaler, Inhale 1-2 puffs into the lungs every 6 (six) hours as needed., Disp: 8 g, Rfl: 0   azithromycin  (ZITHROMAX ) 250 MG tablet, Take 2 tablets on day 1, then 1 tablet daily on days 2 through 5, Disp: 6 tablet, Rfl: 0   guaiFENesin  (MUCINEX ) 600 MG 12 hr tablet, Take 2 tablets (1,200 mg total) by mouth 2 (two) times daily as needed., Disp: 30 tablet, Rfl: 0   promethazine -dextromethorphan (PROMETHAZINE -DM) 6.25-15 MG/5ML syrup, Take 5 mLs by mouth at bedtime as needed., Disp: 118 mL,  Rfl: 0   acetaminophen  (TYLENOL ) 500 MG tablet, Take 2 tablets (1,000 mg total) by mouth every 6 (six) hours as needed for moderate pain (pain score 4-6) or mild pain (pain score 1-3). Do not take within 6hrs of excedrin, Disp: 60 tablet, Rfl: 1   aspirin-acetaminophen -caffeine  (EXCEDRIN MIGRAINE) 250-250-65 MG tablet, Take by mouth every 6 (six) hours as needed for headache., Disp: , Rfl:    ibuprofen  (ADVIL ) 600 MG tablet, Take 1 tablet (600 mg total) by mouth every 6 (six) hours as needed., Disp: 40 tablet, Rfl: 1   Insulin  Pen Needle (BD PEN NEEDLE NANO 2ND GEN) 32G X 4 MM MISC, Use 1 needle daily to inject medication., Disp: 100 each,  Rfl: 0   Iron , Ferrous Sulfate , 325 (65 Fe) MG TABS, Take 325 mg by mouth daily. (Patient taking differently: Take 325 mg by mouth 3 (three) times daily with meals.), Disp: 90 tablet, Rfl: 0   oxyCODONE  (OXY IR/ROXICODONE ) 5 MG immediate release tablet, Take 1-2 tablets (5-10 mg total) by mouth every 4 (four) hours as needed for severe pain (pain score 7-10) or breakthrough pain., Disp: 25 tablet, Rfl: 0   semaglutide -weight management (WEGOVY ) 1.7 MG/0.75ML SOAJ SQ injection, Inject 1.7 mg into the skin once a week for 28 days., Disp: 3 mL, Rfl: 0   valsartan  (DIOVAN ) 160 MG tablet, Take 1 tablet (160 mg total) by mouth daily., Disp: 90 tablet, Rfl: 0   valsartan  (DIOVAN ) 80 MG tablet, Take 1 tablet (80 mg total) by mouth daily. (Patient taking differently: Take 80 mg by mouth daily with lunch.), Disp: 90 tablet, Rfl: 2   Vitamin D , Ergocalciferol , (DRISDOL ) 1.25 MG (50000 UNIT) CAPS capsule, Take 1 capsule (50,000 Units total) by mouth every 7 (seven) days., Disp: 4 capsule, Rfl: 0  "

## 2024-12-09 ENCOUNTER — Other Ambulatory Visit (INDEPENDENT_AMBULATORY_CARE_PROVIDER_SITE_OTHER): Payer: Self-pay | Admitting: Family Medicine

## 2024-12-09 DIAGNOSIS — E669 Obesity, unspecified: Secondary | ICD-10-CM

## 2024-12-19 ENCOUNTER — Other Ambulatory Visit (INDEPENDENT_AMBULATORY_CARE_PROVIDER_SITE_OTHER): Payer: Self-pay | Admitting: Family Medicine

## 2024-12-19 ENCOUNTER — Ambulatory Visit (INDEPENDENT_AMBULATORY_CARE_PROVIDER_SITE_OTHER): Payer: Self-pay | Admitting: Family Medicine

## 2024-12-19 ENCOUNTER — Encounter (INDEPENDENT_AMBULATORY_CARE_PROVIDER_SITE_OTHER): Payer: Self-pay | Admitting: Family Medicine

## 2024-12-19 VITALS — BP 125/87 | HR 72 | Temp 98.1°F | Ht 62.0 in | Wt 173.0 lb

## 2024-12-19 DIAGNOSIS — Z6831 Body mass index (BMI) 31.0-31.9, adult: Secondary | ICD-10-CM

## 2024-12-19 DIAGNOSIS — E669 Obesity, unspecified: Secondary | ICD-10-CM

## 2024-12-19 DIAGNOSIS — E559 Vitamin D deficiency, unspecified: Secondary | ICD-10-CM

## 2024-12-19 DIAGNOSIS — R7303 Prediabetes: Secondary | ICD-10-CM | POA: Diagnosis not present

## 2024-12-19 MED ORDER — VITAMIN D (ERGOCALCIFEROL) 1.25 MG (50000 UNIT) PO CAPS: 50000.0000 [IU] | ORAL_CAPSULE | ORAL | 0 refills | Status: AC

## 2024-12-19 MED ORDER — WEGOVY 1.7 MG/0.75ML ~~LOC~~ SOAJ: 1.7000 mg | SUBCUTANEOUS | 0 refills | Status: AC

## 2024-12-19 NOTE — Progress Notes (Signed)
 "  Office: [phone]  /  Fax: [phone]  WEIGHT SUMMARY AND BIOMETRICS  Anthropometric Measurements Height: 5' 2 (1.575 m) Weight: 173 lb (78.5 kg) BMI (Calculated): 31.63 Weight at Last Visit: 177 lb Weight Lost Since Last Visit: 4 lb Weight Gained Since Last Visit: 0 Starting Weight: 191 lb Total Weight Loss (lbs): 18 lb (8.165 kg) Peak Weight: 207 lb   Body Composition  Body Fat %: 35.6 % Fat Mass (lbs): 61.6 lbs Muscle Mass (lbs): 105.8 lbs Total Body Water (lbs): 68.6 lbs Visceral Fat Rating : 8   Other Clinical Data Fasting: yes Labs: no Today's Visit #: 19 Starting Date: 09/08/18    Chief Complaint: OBESITY    History of Present Illness Patricia Lin is a 44 year old female who presents for obesity treatment and progress assessment.  She is currently following the [place] fast with her church, adhering to it completely. She is not engaging in any exercise but is attempting to maintain adequate hydration and not skip meals. She has lost four pounds in the last month, including over the holidays.  She is being treated for vitamin D  deficiency with prescription ergocalciferol  at a dose of 50,000 international units per week. Her vitamin D  level last month was 43.4. She requests a refill of this medication.  She is also on Wegovy  to manage her polyphagia and prediabetes. Her most recent hemoglobin A1c was 5.2, and her insulin  level was 16.3. She requests a refill of Wegovy .  She has a history of anemia, with her hemoglobin dropping from 10.5 in November to 8.3 recently. She is currently on iron  supplements and is scheduled to see her hematologist soon. She mentions that her anemia fluctuates between hemoglobin levels of 8 and 10.  She recently underwent a hysterectomy and has not fully healed, with three more weeks added to her recovery period. She has started walking slowly as part of her recovery.      PHYSICAL EXAM:  Blood pressure 125/87, pulse 72,  temperature 98.1 F (36.7 C), height 5' 2 (1.575 m), weight 173 lb (78.5 kg), SpO2 97%. Body mass index is 31.64 kg/m.  DIAGNOSTIC DATA REVIEWED:  BMET    Component Value Date/Time   NA 139 10/29/2024 0424   NA 140 10/04/2024 0835   K 3.5 10/29/2024 0424   CL 105 10/29/2024 0424   CO2 23 10/29/2024 0424   GLUCOSE 90 10/29/2024 0424   BUN 7 10/29/2024 0424   BUN 17 10/04/2024 0835   CREATININE 0.74 10/29/2024 0424   CREATININE 0.71 06/06/2024 0846   CREATININE 0.64 01/17/2013 1720   CALCIUM 9.0 10/29/2024 0424   GFRNONAA >60 10/29/2024 0424   GFRNONAA >60 06/06/2024 0846   GFRAA >60 09/04/2019 0130   Lab Results  Component Value Date   HGBA1C 5.2 10/04/2024   HGBA1C 5.2 09/08/2018   Lab Results  Component Value Date   INSULIN  16.3 11/25/2023   INSULIN  11.5 09/08/2018   Lab Results  Component Value Date   TSH 0.918 05/09/2024   CBC    Component Value Date/Time   WBC 7.3 10/29/2024 0424   RBC 3.39 (L) 10/29/2024 0424   HGB 8.3 (L) 10/29/2024 0424   HGB 10.2 (L) 10/20/2024 0953   HGB 8.7 (L) 10/04/2024 0835   HCT 27.4 (L) 10/29/2024 0424   HCT 29.6 (L) 10/04/2024 0835   PLT 326 10/29/2024 0424   PLT 299 10/20/2024 0953   PLT 362 10/04/2024 0835   MCV 80.8 10/29/2024 0424  MCV 81 10/04/2024 0835   MCH 24.5 (L) 10/29/2024 0424   MCHC 30.3 10/29/2024 0424   RDW 19.9 (H) 10/29/2024 0424   RDW 14.8 10/04/2024 0835   Iron  Studies    Component Value Date/Time   IRON  54 10/20/2024 0953   IRON  13 (L) 05/09/2024 1102   TIBC 365 10/20/2024 0953   TIBC 398 05/09/2024 1102   FERRITIN 299 10/20/2024 0953   FERRITIN 5 (L) 05/09/2024 1102   IRONPCTSAT 15 10/20/2024 0953   IRONPCTSAT 3 (LL) 05/09/2024 1102   Lipid Panel     Component Value Date/Time   CHOL 199 05/09/2024 1102   TRIG 67 05/09/2024 1102   HDL 75 05/09/2024 1102   CHOLHDL 2.7 05/09/2024 1102   LDLCALC 112 (H) 05/09/2024 1102   Hepatic Function Panel     Component Value Date/Time   PROT  7.0 10/04/2024 0835   ALBUMIN  4.4 10/04/2024 0835   AST 22 10/04/2024 0835   AST 15 06/06/2024 0846   ALT 25 10/04/2024 0835   ALT 16 06/06/2024 0846   ALKPHOS 73 10/04/2024 0835   BILITOT 0.5 10/04/2024 0835   BILITOT 0.5 06/06/2024 0846      Component Value Date/Time   TSH 0.918 05/09/2024 1102   Nutritional Lab Results  Component Value Date   VD25OH 43.4 11/10/2024   VD25OH 30.3 11/25/2023   VD25OH 23.2 (L) 11/11/2022     Assessment and Plan Assessment & Plan Obesity Management is ongoing with a focus on weight loss. She has lost four pounds in the last month, including over the holidays. Currently following a Toribio fast, which lacks adequate protein, potentially leading to weight regain and metabolic issues. Not exercising due to recent hysterectomy and ongoing recovery. Hydration is emphasized as critical during the fast. - Provided modified Daniel fast plan to ensure adequate protein intake. - Encouraged hydration and light exercise such as walking. - Refilled Wegovy  prescription at 1.7 mg.  Vitamin D  deficiency Managed with prescription ergocalciferol  50,000 IU weekly. Recent vitamin D  level improved to 43.4, but still below the goal of 50-60. - Refilled ergocalciferol  prescription.  Prediabetes Managed with Wegovy , which also aids in weight management. Recent hemoglobin A1c is well controlled at 5.2, but insulin  level is elevated at 16.3, likely due to recent holiday indulgences. - Refilled Wegovy  prescription at 1.7 mg.      Patients who are on anti-obesity medications are counseled on the importance of maintaining healthy lifestyle habits, including balanced nutrition, regular physical activity, and behavioral modifications,  Medication is an adjunct to, not a replacement for, lifestyle changes and that the [place]-term success and weight maintenance depend on continued adherence to these strategies.   Celita was informed of the importance of frequent follow up  visits to maximize her success with intensive lifestyle modifications for her obesity and obesity related health conditions as recommended by USPSTF and CMS guidelines    Louann Penton, MD   "

## 2024-12-20 ENCOUNTER — Inpatient Hospital Stay: Admitting: Internal Medicine

## 2024-12-20 ENCOUNTER — Inpatient Hospital Stay: Attending: Internal Medicine

## 2024-12-20 VITALS — BP 145/105 | HR 80 | Temp 97.8°F | Resp 17 | Ht 62.0 in | Wt 175.4 lb

## 2024-12-20 DIAGNOSIS — Z862 Personal history of diseases of the blood and blood-forming organs and certain disorders involving the immune mechanism: Secondary | ICD-10-CM | POA: Diagnosis present

## 2024-12-20 DIAGNOSIS — Z90710 Acquired absence of both cervix and uterus: Secondary | ICD-10-CM | POA: Insufficient documentation

## 2024-12-20 DIAGNOSIS — D500 Iron deficiency anemia secondary to blood loss (chronic): Secondary | ICD-10-CM

## 2024-12-20 LAB — CBC WITH DIFFERENTIAL (CANCER CENTER ONLY)
Abs Immature Granulocytes: 0 K/uL (ref 0.00–0.07)
Basophils Absolute: 0 K/uL (ref 0.0–0.1)
Basophils Relative: 1 %
Eosinophils Absolute: 0 K/uL (ref 0.0–0.5)
Eosinophils Relative: 1 %
HCT: 38.5 % (ref 36.0–46.0)
Hemoglobin: 12.3 g/dL (ref 12.0–15.0)
Immature Granulocytes: 0 %
Lymphocytes Relative: 45 %
Lymphs Abs: 1.3 K/uL (ref 0.7–4.0)
MCH: 25.4 pg — ABNORMAL LOW (ref 26.0–34.0)
MCHC: 31.9 g/dL (ref 30.0–36.0)
MCV: 79.4 fL — ABNORMAL LOW (ref 80.0–100.0)
Monocytes Absolute: 0.3 K/uL (ref 0.1–1.0)
Monocytes Relative: 9 %
Neutro Abs: 1.3 K/uL — ABNORMAL LOW (ref 1.7–7.7)
Neutrophils Relative %: 44 %
Platelet Count: 241 K/uL (ref 150–400)
RBC: 4.85 MIL/uL (ref 3.87–5.11)
RDW: 15.7 % — ABNORMAL HIGH (ref 11.5–15.5)
WBC Count: 2.9 K/uL — ABNORMAL LOW (ref 4.0–10.5)
nRBC: 0 % (ref 0.0–0.2)

## 2024-12-20 LAB — IRON AND IRON BINDING CAPACITY (CC-WL,HP ONLY)
Iron: 77 ug/dL (ref 28–170)
Saturation Ratios: 24 % (ref 10.4–31.8)
TIBC: 325 ug/dL (ref 250–450)
UIBC: 247 ug/dL

## 2024-12-20 LAB — FERRITIN: Ferritin: 41 ng/mL (ref 11–307)

## 2024-12-20 NOTE — Progress Notes (Signed)
 "     [facility] [phone]   [phone]  OFFICE PROGRESS NOTE  Patricia Greig PARAS, NP [address] [place] [address]  DIAGNOSIS: Iron  deficiency anemia secondary to menorrhagia.  Resolved after hysterectomy.  PRIOR THERAPY: Ferrous sulfate  325 mg p.o. 3 times daily with no improvement.  CURRENT THERAPY: Venofer  300 Mg IV weekly for 3 weeks.  Last iron  infusion in November 2025  INTERVAL HISTORY: Patricia Lin 44 y.o. female returns to the clinic today for follow-up visit. Discussed the use of AI scribe software for clinical note transcription with the patient, who gave verbal consent to proceed.  History of Present Illness Patricia Lin is a 44 year old female with iron  deficiency anemia secondary to menorrhagia, status post hysterectomy, who presents for evaluation and repeat blood work.  She experienced iron  deficiency anemia due to menorrhagia, previously managed with Venofer  infusions. She underwent hysterectomy in November 2025 with a two-day hospitalization.  Since the hysterectomy, she has not noted significant improvement in energy levels and continues to experience fatigue. She is not currently taking iron  supplementation but is taking vitamin D . She is fasting and not consuming iron -rich foods.  She denies dizziness and pica, including craving for ice.    MEDICAL HISTORY: Past Medical History:  Diagnosis Date   Adenomyosis    Anxiety    Depression    Diverticulosis of colon    Fatigue associated with anemia    H/O varicella    Heart murmur    last cardiologist office visit by Orren Fabry PA dated 09-28-2024 murmur heard,  echo was done 08-24-2024 trivial MR/  mild TR   History of cervical dysplasia 2009   abn pap HGSIL ;   08/ 2009  s/p LEEP -- CIN 1;   11/ 2009  s/p top hat and LEEP for persistant CIN 1 (previous LEEP positive margins)   History of chlamydia infection    History of diverticulitis of colon 08/2022    no surgical intervention   History of palpitations in adulthood 2023   cardiology--- Dr MYRTIS Gull;  evaluation for palpitations and murmur in 2023,  per note no murmur heard them and had trivial MR/ TR echo, no monitor done   History of pre-eclampsia 2011   with pregnancies 2011 and 2013   HTN (hypertension)    followed by pcp /   cardiologist--- Dr MYRTIS Gull;   pt has had previous surgery cancelled due to bp elevated;   Hyperlipidemia    Iron  deficiency anemia due to chronic blood loss    oncology/ hematology--- Dr CHRISTELLA. Carrigan Delafuente;  treated w/ IV iron  infusions,  last  x3 11/ 2025 (4th, 11th, 18th);  x2 10/ 2025;  06/2024 x2;  needed infusions in previous years and transusions   Menorrhagia with irregular cycle    Migraines     ALLERGIES:  is allergic to latex.  MEDICATIONS:  Current Outpatient Medications  Medication Sig Dispense Refill   acetaminophen  (TYLENOL ) 500 MG tablet Take 2 tablets (1,000 mg total) by mouth every 6 (six) hours as needed for moderate pain (pain score 4-6) or mild pain (pain score 1-3). Do not take within 6hrs of excedrin 60 tablet 1   albuterol  (VENTOLIN  HFA) 108 (90 Base) MCG/ACT inhaler Inhale 1-2 puffs into the lungs every 6 (six) hours as needed. 8 g 0   aspirin-acetaminophen -caffeine  (EXCEDRIN MIGRAINE) 250-250-65 MG tablet Take by mouth every 6 (six) hours as needed for headache.  guaiFENesin  (MUCINEX ) 600 MG 12 hr tablet Take 2 tablets (1,200 mg total) by mouth 2 (two) times daily as needed. 30 tablet 0   ibuprofen  (ADVIL ) 600 MG tablet Take 1 tablet (600 mg total) by mouth every 6 (six) hours as needed. 40 tablet 1   Insulin  Pen Needle (BD PEN NEEDLE NANO 2ND GEN) 32G X 4 MM MISC Use 1 needle daily to inject medication. 100 each 0   Iron , Ferrous Sulfate , 325 (65 Fe) MG TABS Take 325 mg by mouth daily. (Patient taking differently: Take 325 mg by mouth 3 (three) times daily with meals.) 90 tablet 0   oxyCODONE  (OXY IR/ROXICODONE ) 5 MG immediate release tablet  Take 1-2 tablets (5-10 mg total) by mouth every 4 (four) hours as needed for severe pain (pain score 7-10) or breakthrough pain. 25 tablet 0   promethazine -dextromethorphan (PROMETHAZINE -DM) 6.25-15 MG/5ML syrup Take 5 mLs by mouth at bedtime as needed. 118 mL 0   valsartan  (DIOVAN ) 160 MG tablet Take 1 tablet (160 mg total) by mouth daily. 90 tablet 0   valsartan  (DIOVAN ) 80 MG tablet Take 1 tablet (80 mg total) by mouth daily. (Patient taking differently: Take 80 mg by mouth daily with lunch.) 90 tablet 2   semaglutide -weight management (WEGOVY ) 1.7 MG/0.75ML SOAJ SQ injection Inject 1.7 mg into the skin once a week. 9 mL 0   Vitamin D , Ergocalciferol , (DRISDOL ) 1.25 MG (50000 UNIT) CAPS capsule Take 1 capsule (50,000 Units total) by mouth every 7 (seven) days. 4 capsule 0   No current facility-administered medications for this visit.    SURGICAL HISTORY:  Past Surgical History:  Procedure Laterality Date   BREAST REDUCTION SURGERY Bilateral 2008   CERVICAL BIOPSY  W/ LOOP ELECTRODE EXCISION  10/16/2008   @[facility]   by Dr DELENA. Henry;  top hat(endocervial )   COLONOSCOPY WITH PROPOFOL   08/2024   Dr Kristie   CYSTOSCOPY N/A 10/28/2024   Procedure: CYSTOSCOPY;  Surgeon: Henry Slough, MD;  Location: [facility] OR;  Service: Gynecology;  Laterality: N/A;   DILATION AND EVACUATION  01/29/2011   @[facility]   by Dr M. Lavoie;   FINGER SURGERY Right 2001   Right index finger tendon repair   LAPAROSCOPIC ASSISTED VAGINAL HYSTERECTOMY N/A 10/28/2024   Procedure: HYSTERECTOMY, VAGINAL, LAPAROSCOPY-ASSISTED;  Surgeon: Henry Slough, MD;  Location: [facility] OR;  Service: Gynecology;  Laterality: N/A;   LAPAROSCOPIC BILATERAL SALPINGECTOMY  10/28/2024   Procedure: SALPINGECTOMY, BILATERAL, LAPAROSCOPIC;  Surgeon: Henry Slough, MD;  Location: [facility] OR;  Service: Gynecology;;   LEEP  07/25/2008   @[facility]   by Dr A. Henry   TUBAL LIGATION  01/09/2013   Procedure: POST PARTUM TUBAL LIGATION;  Surgeon: Ovid DELENA All, MD;   Location: [facility] ORS;  Service: Gynecology;  Laterality: Bilateral;  post partum tubal ligation bilateral   WISDOM TOOTH EXTRACTION      REVIEW OF SYSTEMS:  A comprehensive review of systems was negative except for: Constitutional: positive for fatigue   PHYSICAL EXAMINATION: General appearance: alert, cooperative, fatigued, and no distress Head: Normocephalic, without obvious abnormality, atraumatic Neck: no adenopathy, no JVD, supple, symmetrical, trachea midline, and thyroid  not enlarged, symmetric, no tenderness/mass/nodules Lymph nodes: Cervical, supraclavicular, and axillary nodes normal. Resp: clear to auscultation bilaterally Back: symmetric, no curvature. ROM normal. No CVA tenderness. Cardio: regular rate and rhythm, S1, S2 normal, no murmur, click, rub or gallop GI: soft, non-tender; bowel sounds normal; no masses,  no organomegaly Extremities: extremities normal, atraumatic, no cyanosis or edema  ECOG PERFORMANCE STATUS: 1 -  Symptomatic but completely ambulatory  Blood pressure (!) 145/105, pulse 80, temperature 97.8 F (36.6 C), temperature source Temporal, resp. rate 17, height 5' 2 (1.575 m), weight 175 lb 6.4 oz (79.6 kg), SpO2 100%.  LABORATORY DATA: Lab Results  Component Value Date   WBC 2.9 (L) 12/20/2024   HGB 12.3 12/20/2024   HCT 38.5 12/20/2024   MCV 79.4 (L) 12/20/2024   PLT 241 12/20/2024      Chemistry      Component Value Date/Time   NA 139 10/29/2024 0424   NA 140 10/04/2024 0835   K 3.5 10/29/2024 0424   CL 105 10/29/2024 0424   CO2 23 10/29/2024 0424   BUN 7 10/29/2024 0424   BUN 17 10/04/2024 0835   CREATININE 0.74 10/29/2024 0424   CREATININE 0.71 06/06/2024 0846   CREATININE 0.64 01/17/2013 1720      Component Value Date/Time   CALCIUM 9.0 10/29/2024 0424   ALKPHOS 73 10/04/2024 0835   AST 22 10/04/2024 0835   AST 15 06/06/2024 0846   ALT 25 10/04/2024 0835   ALT 16 06/06/2024 0846   BILITOT 0.5 10/04/2024 0835   BILITOT 0.5  06/06/2024 0846       RADIOGRAPHIC STUDIES: No results found.   ASSESSMENT AND PLAN: This is a very pleasant 44 years old African-American female with history of iron  deficiency anemia secondary to menorrhagia and not responding to the oral iron  tablets.  The patient was treated recently with iron  infusion with Venofer  500 Mg IV weekly for 2 weeks. She has been on oral iron  tablets and tolerating it well but not effective in controlling her anemia. She is currently on iron  infusion with Venofer  on as-needed basis.  The last infusion started in October 2025. She underwent hysterectomy in November 2025 with resolution of her anemia. Assessment and Plan Assessment & Plan Iron  deficiency anemia, resolved after hysterectomy Iron  deficiency anemia secondary to menorrhagia resolved following hysterectomy in November 2025. Hemoglobin has normalized to 12.3 g/dL, and she is asymptomatic without ongoing iron  supplementation. Awaiting iron  studies to confirm normalization of iron  stores. - Reviewed hemoglobin, now 12.3 g/dL and within normal limits. - Awaiting iron  studies to assess current iron  status. - Advised that if iron  studies are normal, no further hematology follow-up is necessary. - Provided dietary counseling to support iron  levels. - Advised that if iron  stores remain low, repeat testing may be indicated. She was advised to call immediately if she has any other concerning symptoms in the interval.  The patient voices understanding of current disease status and treatment options and is in agreement with the current care plan.  All questions were answered. The patient knows to call the clinic with any problems, questions or concerns. We can certainly see the patient much sooner if necessary.  The total time spent in the appointment was 20 minutes.  Disclaimer: This note was dictated with voice recognition software. Similar sounding words can inadvertently be transcribed and may not be  corrected upon review.        "

## 2024-12-23 ENCOUNTER — Ambulatory Visit: Admitting: Plastic Surgery

## 2024-12-23 ENCOUNTER — Encounter: Payer: Self-pay | Admitting: Plastic Surgery

## 2024-12-23 VITALS — BP 165/114 | HR 81 | Ht 62.0 in | Wt 175.6 lb

## 2024-12-23 DIAGNOSIS — M542 Cervicalgia: Secondary | ICD-10-CM

## 2024-12-23 DIAGNOSIS — G8929 Other chronic pain: Secondary | ICD-10-CM

## 2024-12-23 DIAGNOSIS — M546 Pain in thoracic spine: Secondary | ICD-10-CM | POA: Diagnosis not present

## 2024-12-23 DIAGNOSIS — M793 Panniculitis, unspecified: Secondary | ICD-10-CM | POA: Diagnosis not present

## 2024-12-23 DIAGNOSIS — R21 Rash and other nonspecific skin eruption: Secondary | ICD-10-CM

## 2024-12-23 NOTE — Progress Notes (Signed)
" ° °  Subjective:    Patient ID: Patricia Lin, female    DOB: Nov 25, 1981, 44 y.o.   MRN: [id number]  The patient is a 44 year old female here for a follow-up on her abdomen.  She was seen 6 months ago with concerns for panniculitis.  We agreed that weight loss was good to be important.  She is 5 feet 1 inch tall and she has lost a fair bit of weight since her last appointment.  She is now 175 pounds.  She still complains of pain in the upper back area as well as in her neck.  She gets skin breakdown and rashes in her folds.  Unfortunately this has gotten worse with her weight loss.  She does not have any hernia or abdominal bulge.  She is also noticing hyperpigmentation in her folds.  She has tried creams and powders without any Henckel-term support.      Review of Systems  Constitutional: Negative.   HENT: Negative.    Eyes: Negative.   Respiratory: Negative.    Cardiovascular: Negative.   Gastrointestinal: Negative.   Endocrine: Negative.   Genitourinary: Negative.   Musculoskeletal:  Positive for back pain and neck pain.  Skin:  Positive for rash.       Objective:   Physical Exam Vitals reviewed.  Constitutional:      Appearance: Normal appearance.  HENT:     Head: Atraumatic.  Cardiovascular:     Rate and Rhythm: Normal rate.     Pulses: Normal pulses.  Pulmonary:     Effort: Pulmonary effort is normal.  Abdominal:     General: There is no distension.     Palpations: Abdomen is soft. There is no mass.     Tenderness: There is no abdominal tenderness.     Hernia: No hernia is present.  Skin:    General: Skin is warm.     Capillary Refill: Capillary refill takes less than 2 seconds.  Neurological:     Mental Status: She is alert and oriented to person, place, and time.  Psychiatric:        Mood and Affect: Mood normal.        Behavior: Behavior normal.        Thought Content: Thought content normal.        Judgment: Judgment normal.         Assessment & Plan:      ICD-10-CM   1. Chronic bilateral thoracic back pain  M54.6    G89.29     2. Panniculitis  M79.3        Patient thinks she can lose another 10 to 15 pounds in the next 6 months.  We will go ahead and give her time for that and then see her back in July.  I think she is going to be a really good candidate for panniculectomy. "

## 2024-12-27 ENCOUNTER — Encounter (INDEPENDENT_AMBULATORY_CARE_PROVIDER_SITE_OTHER): Payer: Self-pay | Admitting: Family Medicine

## 2025-01-16 ENCOUNTER — Ambulatory Visit (INDEPENDENT_AMBULATORY_CARE_PROVIDER_SITE_OTHER): Admitting: Family Medicine

## 2025-02-14 ENCOUNTER — Ambulatory Visit (INDEPENDENT_AMBULATORY_CARE_PROVIDER_SITE_OTHER): Admitting: Family Medicine

## 2025-03-14 ENCOUNTER — Ambulatory Visit (INDEPENDENT_AMBULATORY_CARE_PROVIDER_SITE_OTHER): Admitting: Family Medicine

## 2025-05-05 ENCOUNTER — Encounter: Admitting: Family

## 2025-06-23 ENCOUNTER — Ambulatory Visit: Admitting: Plastic Surgery
